# Patient Record
Sex: Female | Born: 1955
Health system: Southern US, Community
[De-identification: ages and names within clinical notes are randomized; demographics above are authoritative.]

## PROBLEM LIST (undated history)

## (undated) DIAGNOSIS — Z8719 Personal history of other diseases of the digestive system: Secondary | ICD-10-CM

## (undated) DIAGNOSIS — R160 Hepatomegaly, not elsewhere classified: Secondary | ICD-10-CM

## (undated) DIAGNOSIS — G4700 Insomnia, unspecified: Secondary | ICD-10-CM

## (undated) DIAGNOSIS — T8859XA Other complications of anesthesia, initial encounter: Secondary | ICD-10-CM

## (undated) DIAGNOSIS — T4145XA Adverse effect of unspecified anesthetic, initial encounter: Secondary | ICD-10-CM

## (undated) DIAGNOSIS — K219 Gastro-esophageal reflux disease without esophagitis: Secondary | ICD-10-CM

## (undated) DIAGNOSIS — F419 Anxiety disorder, unspecified: Secondary | ICD-10-CM

## (undated) DIAGNOSIS — C801 Malignant (primary) neoplasm, unspecified: Secondary | ICD-10-CM

## (undated) HISTORY — DX: Insomnia, unspecified: G47.00

## (undated) HISTORY — PX: OTHER SURGICAL HISTORY: SHX169

## (undated) HISTORY — PX: HERNIA REPAIR: SHX51

## (undated) HISTORY — PX: LIVER SURGERY: SHX698

## (undated) HISTORY — PX: BREAST SURGERY: SHX581

## (undated) HISTORY — DX: Personal history of other diseases of the digestive system: Z87.19

## (undated) HISTORY — DX: Malignant (primary) neoplasm, unspecified: C80.1

## (undated) HISTORY — DX: Hepatomegaly, not elsewhere classified: R16.0

---

## 1999-07-18 ENCOUNTER — Other Ambulatory Visit: Admission: RE | Admit: 1999-07-18 | Discharge: 1999-07-18 | Payer: Self-pay | Admitting: Obstetrics and Gynecology

## 1999-11-06 ENCOUNTER — Other Ambulatory Visit: Admission: RE | Admit: 1999-11-06 | Discharge: 1999-11-06 | Payer: Self-pay | Admitting: Obstetrics and Gynecology

## 1999-12-28 ENCOUNTER — Encounter: Payer: Self-pay | Admitting: Emergency Medicine

## 1999-12-28 ENCOUNTER — Inpatient Hospital Stay (HOSPITAL_COMMUNITY): Admission: EM | Admit: 1999-12-28 | Discharge: 2000-01-11 | Payer: Self-pay | Admitting: Emergency Medicine

## 1999-12-29 ENCOUNTER — Encounter: Payer: Self-pay | Admitting: Surgery

## 2000-09-02 ENCOUNTER — Other Ambulatory Visit: Admission: RE | Admit: 2000-09-02 | Discharge: 2000-09-02 | Payer: Self-pay | Admitting: Obstetrics and Gynecology

## 2001-03-24 ENCOUNTER — Encounter: Admission: RE | Admit: 2001-03-24 | Discharge: 2001-03-24 | Payer: Self-pay | Admitting: Surgery

## 2001-03-24 ENCOUNTER — Encounter: Payer: Self-pay | Admitting: Surgery

## 2002-06-21 ENCOUNTER — Other Ambulatory Visit: Admission: RE | Admit: 2002-06-21 | Discharge: 2002-06-21 | Payer: Self-pay | Admitting: Obstetrics and Gynecology

## 2002-06-22 ENCOUNTER — Other Ambulatory Visit: Admission: RE | Admit: 2002-06-22 | Discharge: 2002-06-22 | Payer: Self-pay | Admitting: Obstetrics and Gynecology

## 2003-09-26 ENCOUNTER — Other Ambulatory Visit: Admission: RE | Admit: 2003-09-26 | Discharge: 2003-09-26 | Payer: Self-pay | Admitting: Obstetrics and Gynecology

## 2011-02-19 ENCOUNTER — Other Ambulatory Visit: Payer: Self-pay | Admitting: Radiology

## 2011-02-20 ENCOUNTER — Other Ambulatory Visit: Payer: Self-pay | Admitting: Radiology

## 2011-02-20 DIAGNOSIS — C50911 Malignant neoplasm of unspecified site of right female breast: Secondary | ICD-10-CM

## 2011-02-26 ENCOUNTER — Ambulatory Visit
Admission: RE | Admit: 2011-02-26 | Discharge: 2011-02-26 | Disposition: A | Discharge status: Home / Self Care | Payer: BC Managed Care – PPO | Source: Ambulatory Visit | Attending: Radiology | Admitting: Radiology

## 2011-02-26 DIAGNOSIS — C50911 Malignant neoplasm of unspecified site of right female breast: Secondary | ICD-10-CM

## 2011-02-26 MED ORDER — GADOBENATE DIMEGLUMINE 529 MG/ML IV SOLN
12.0000 mL | Freq: Once | INTRAVENOUS | Status: AC | PRN
  Administered 2011-02-26: 12 mL via INTRAVENOUS

## 2011-02-27 ENCOUNTER — Other Ambulatory Visit: Payer: Self-pay | Admitting: Oncology

## 2011-02-27 ENCOUNTER — Encounter (HOSPITAL_BASED_OUTPATIENT_CLINIC_OR_DEPARTMENT_OTHER): Payer: BC Managed Care – PPO | Admitting: Oncology

## 2011-02-27 DIAGNOSIS — C50919 Malignant neoplasm of unspecified site of unspecified female breast: Secondary | ICD-10-CM

## 2011-02-27 DIAGNOSIS — D369 Benign neoplasm, unspecified site: Secondary | ICD-10-CM

## 2011-02-27 LAB — CBC WITH DIFFERENTIAL/PLATELET
BASO%: 0.6 % (ref 0.0–2.0)
Eosinophils Absolute: 0 10*3/uL (ref 0.0–0.5)
MCHC: 33.7 g/dL (ref 31.5–36.0)
MONO#: 0.3 10*3/uL (ref 0.1–0.9)
NEUT#: 3.9 10*3/uL (ref 1.5–6.5)
Platelets: 229 10*3/uL (ref 145–400)
RBC: 4.59 10*6/uL (ref 3.70–5.45)
RDW: 13.1 % (ref 11.2–14.5)
WBC: 5.8 10*3/uL (ref 3.9–10.3)
lymph#: 1.5 10*3/uL (ref 0.9–3.3)

## 2011-02-27 LAB — COMPREHENSIVE METABOLIC PANEL
ALT: 48 U/L — ABNORMAL HIGH (ref 0–35)
Albumin: 4.3 g/dL (ref 3.5–5.2)
CO2: 28 mEq/L (ref 19–32)
Calcium: 9.5 mg/dL (ref 8.4–10.5)
Chloride: 100 mEq/L (ref 96–112)
Glucose, Bld: 117 mg/dL — ABNORMAL HIGH (ref 70–99)
Potassium: 3.7 mEq/L (ref 3.5–5.3)
Sodium: 136 mEq/L (ref 135–145)
Total Bilirubin: 0.3 mg/dL (ref 0.3–1.2)
Total Protein: 7.4 g/dL (ref 6.0–8.3)

## 2011-03-01 ENCOUNTER — Other Ambulatory Visit (INDEPENDENT_AMBULATORY_CARE_PROVIDER_SITE_OTHER): Payer: Self-pay | Admitting: Surgery

## 2011-03-01 DIAGNOSIS — C50911 Malignant neoplasm of unspecified site of right female breast: Secondary | ICD-10-CM

## 2011-03-04 ENCOUNTER — Encounter: Payer: BC Managed Care – PPO | Admitting: Genetic Counselor

## 2011-03-04 ENCOUNTER — Ambulatory Visit (HOSPITAL_COMMUNITY)
Admission: RE | Admit: 2011-03-04 | Discharge: 2011-03-04 | Disposition: A | Discharge status: Home / Self Care | Payer: BC Managed Care – PPO | Source: Ambulatory Visit | Attending: Oncology | Admitting: Oncology

## 2011-03-04 DIAGNOSIS — C50919 Malignant neoplasm of unspecified site of unspecified female breast: Secondary | ICD-10-CM | POA: Insufficient documentation

## 2011-03-04 DIAGNOSIS — D369 Benign neoplasm, unspecified site: Secondary | ICD-10-CM

## 2011-03-04 DIAGNOSIS — K7689 Other specified diseases of liver: Secondary | ICD-10-CM | POA: Insufficient documentation

## 2011-03-04 MED ORDER — GADOBENATE DIMEGLUMINE 529 MG/ML IV SOLN
15.0000 mL | Freq: Once | INTRAVENOUS | Status: AC | PRN
  Administered 2011-03-04: 15 mL via INTRAVENOUS

## 2011-03-21 ENCOUNTER — Other Ambulatory Visit (INDEPENDENT_AMBULATORY_CARE_PROVIDER_SITE_OTHER): Payer: Self-pay | Admitting: General Surgery

## 2011-03-21 ENCOUNTER — Encounter (HOSPITAL_COMMUNITY)
Admission: RE | Admit: 2011-03-21 | Discharge: 2011-03-21 | Disposition: A | Discharge status: Home / Self Care | Payer: BC Managed Care – PPO | Source: Ambulatory Visit | Attending: Surgery | Admitting: Surgery

## 2011-03-21 DIAGNOSIS — C50911 Malignant neoplasm of unspecified site of right female breast: Secondary | ICD-10-CM

## 2011-03-21 LAB — COMPREHENSIVE METABOLIC PANEL
Alkaline Phosphatase: 138 U/L — ABNORMAL HIGH (ref 39–117)
BUN: 18 mg/dL (ref 6–23)
Calcium: 10.4 mg/dL (ref 8.4–10.5)
GFR calc non Af Amer: >60 mL/min (ref >60)
Glucose, Bld: 76 mg/dL (ref 70–99)
Total Protein: 7.3 g/dL (ref 6.0–8.3)

## 2011-03-21 LAB — CBC
MCV: 87.6 fL (ref 78.0–100.0)
Platelets: 214 10*3/uL (ref 150–400)
RBC: 4.68 MIL/uL (ref 3.87–5.11)
RDW: 13 % (ref 11.5–15.5)
WBC: 7.1 10*3/uL (ref 4.0–10.5)

## 2011-03-21 LAB — SURGICAL PCR SCREEN
MRSA, PCR: NEGATIVE
Staphylococcus aureus: POSITIVE — AB

## 2011-03-22 ENCOUNTER — Other Ambulatory Visit (INDEPENDENT_AMBULATORY_CARE_PROVIDER_SITE_OTHER): Payer: Self-pay | Admitting: General Surgery

## 2011-03-22 ENCOUNTER — Ambulatory Visit (HOSPITAL_COMMUNITY)
Admission: RE | Admit: 2011-03-22 | Discharge: 2011-03-22 | Disposition: A | Discharge status: Home / Self Care | Payer: BC Managed Care – PPO | Source: Ambulatory Visit | Attending: Surgery | Admitting: Surgery

## 2011-03-22 ENCOUNTER — Ambulatory Visit (HOSPITAL_COMMUNITY): Admission: RE | Admit: 2011-03-22 | Payer: BC Managed Care – PPO | Source: Ambulatory Visit | Admitting: Surgery

## 2011-03-22 ENCOUNTER — Ambulatory Visit (HOSPITAL_BASED_OUTPATIENT_CLINIC_OR_DEPARTMENT_OTHER)
Admission: RE | Admit: 2011-03-22 | Discharge: 2011-03-22 | Disposition: A | Discharge status: Home / Self Care | Payer: BC Managed Care – PPO | Source: Ambulatory Visit | Attending: General Surgery | Admitting: General Surgery

## 2011-03-22 DIAGNOSIS — C50911 Malignant neoplasm of unspecified site of right female breast: Secondary | ICD-10-CM

## 2011-03-22 DIAGNOSIS — Z01812 Encounter for preprocedural laboratory examination: Secondary | ICD-10-CM | POA: Insufficient documentation

## 2011-03-22 DIAGNOSIS — C50919 Malignant neoplasm of unspecified site of unspecified female breast: Secondary | ICD-10-CM | POA: Insufficient documentation

## 2011-03-22 DIAGNOSIS — Z01810 Encounter for preprocedural cardiovascular examination: Secondary | ICD-10-CM | POA: Insufficient documentation

## 2011-03-22 DIAGNOSIS — D0590 Unspecified type of carcinoma in situ of unspecified breast: Secondary | ICD-10-CM | POA: Insufficient documentation

## 2011-03-22 MED ORDER — TECHNETIUM TC 99M SULFUR COLLOID FILTERED
1.0000 | Freq: Once | INTRAVENOUS | Status: AC | PRN
  Administered 2011-03-22: 1 via INTRADERMAL

## 2011-03-28 NOTE — Op Note (Addendum)
Crystal [name], Crystal [name]              ACCOUNT NO.:  [id number]  MEDICAL RECORD NO.:  [id number]  LOCATION:  [facility]                          FACILITY:  [facility]  PHYSICIAN:  Angelia Mould. Derrell Lolling, M.D.DATE OF BIRTH:  1955/11/29  DATE OF PROCEDURE:  03/22/2011 DATE OF DISCHARGE:                              OPERATIVE REPORT   PREOPERATIVE DIAGNOSIS:  High-grade ductal carcinoma, right breast, clinical stage T2, N0, receptor positive.  POSTOPERATIVE DIAGNOSIS:  High-grade ductal carcinoma, right breast, clinical stage T2, N0, receptor positive.  OPERATIONS PERFORMED: 1. Inject blue dye, right breast. 2. Right partial mastectomy with needle localization. 3. Right axillary sentinel lymph node mapping and biopsy.  SURGEON:  Angelia Mould. Derrell Lolling, MD  OPERATIVE INDICATIONS:  This is a 55 year old Caucasian female who has been followed because of area of distortion in the right breast.  This was recently biopsied with stereotactic technique because of new microcalcifications.  The pathology demonstrated high-grade ductal carcinoma.  The specimen was highly fragmented and so true invasion could not be proven, but was highly suspected.  The patient was seen by Dr. Abigail Miyamoto, Dr. Ruthann Cancer, and Dr. Dorothy Puffer in the Breast [facility] on Feb 27, 2011.  They felt that she was a candidate for breast conservation surgery.  Dr. Magnus Ivan has had to take leave of absence for family medical reasons and the patient wanted to proceed with her surgery, and I have volunteered to do that.  I discussed her care with her in the office yesterday afternoon, and she was brought to the operating room electively.  OPERATIVE FINDINGS:  The patient had a localizing wire placed at [facility] today and the wire was directed from medial to lateral and went nicely through the center of the density.  On the imaging studies and by palpation, this area appeared to be 2.5 cm in diameter.  I  could easily palpate this at about the 12 o'clock position of the right breast.  There was no skin change or fixation.  I found 3 sentinel lymph nodes none of which looked or felt pathologic.  The specimen and mammogram looked good with the density in the center of the specimen.  OPERATIVE TECHNIQUE:  The patient underwent wire localization at [facility] and that wire was placed from medial to lateral and went right through the area of density and marker clip.  The patient was brought to [facility] to the holding area where she underwent injection of radionuclide by the Nuclear Medicine technician. The patient was taken to the operating room.  General anesthesia was induced.  Intravenous antibiotics were given.  Following an alcohol prep, I injected 5 mL of blue dye into the right breast subareolar area. This was 2 mL of methylene blue mixed with 3 mL of saline.  The breast was massaged for 5 minutes.  Surgical time-out was held, identifying correct patient, correct procedure, and correct site.  The right breast and shoulder and axilla and chest wall were then prepped and draped in a sterile fashion.  After reviewing the needle looked films,  I marked a somewhat transverse curvilinear elliptical incision in the superior aspect of the right breast.  Because of the size of the tumor, I decided to take a very thin skin ellipse anteriorly, but the incision itself was at least 10 cm in length because of the size of the palpable mass.  Dissection was carried down around the palpable mass all the way down to the pectoralis fascia and the breast tissue in this area and the tumor contained within was dissected off the pectoralis fascia.  The specimen was removed and marked with a 6-color margin marker kit.  Specimen and mammogram confirmed that the density was in the center of the specimen and it appeared radiographically that we had adequate margins.  This was a large  specimen.  The specimen was then sent to Pathology.  The wound was copiously irrigated with saline.  Hemostasis was excellent.  I undermined the breast tissue superiorly and inferiorly off the pectoralis fascia.  I closed the breast tissue with interrupted sutures of 3-0 Vicryl and the skin was closed with running subcuticular suture of 4-0 Monocryl and Dermabond.  Attention was directed to the right axilla.  Neoprobe was used. Transverse incision was made in the right axilla at the hairline. Dissection was carried down through the subcutaneous tissue.  The clavipectoral fascia was incised.  Using the Neoprobe, I isolated 3 sentinel lymph nodes.  These were all very hot and very blue.  All 3 sentinel lymph nodes were removed and sent for routine histology.  After these three lymph nodes were removed, there was almost no radioactivity left and I felt that this was all that we could do.  The wound was irrigated with saline.  Hemostasis was excellent and achieved electrocautery.  The deeper tissues were closed with interrupted sutures of 3-0 Vicryl and the skin was closed with a running subcuticular suture of 4-0 Monocryl and Dermabond.  A breast binder was placed.  Ice packs were placed.  The patient was taken to the recovery room in stable condition.  Estimated blood loss was about 20 mL.  Complications none. Sponge, needle, and instrument counts were correct.     Angelia Mould. Derrell Lolling, M.D.   ______________________________ Angelia Mould. Derrell Lolling, M.D.    HMI/MEDQ  D:  03/22/2011  T:  03/23/2011  Job:  [id number]  cc:   Deirdre Peer. Nehemiah Settle, M.D. Lendon Colonel, MD Lowella Dell, M.D. Radene Gunning, M.D., Ph.D.  Electronically Signed by Claud Kelp M.D. on 03/28/2011 05:28:36 PM

## 2011-04-11 ENCOUNTER — Encounter: Payer: BC Managed Care – PPO | Admitting: Oncology

## 2011-04-19 ENCOUNTER — Ambulatory Visit
Admission: RE | Admit: 2011-04-19 | Discharge: 2011-04-19 | Disposition: A | Discharge status: Home / Self Care | Payer: BC Managed Care – PPO | Source: Ambulatory Visit | Attending: Radiation Oncology | Admitting: Radiation Oncology

## 2011-04-19 DIAGNOSIS — L539 Erythematous condition, unspecified: Secondary | ICD-10-CM | POA: Insufficient documentation

## 2011-04-19 DIAGNOSIS — C50419 Malignant neoplasm of upper-outer quadrant of unspecified female breast: Secondary | ICD-10-CM | POA: Insufficient documentation

## 2011-04-19 DIAGNOSIS — R5383 Other fatigue: Secondary | ICD-10-CM | POA: Insufficient documentation

## 2011-04-19 DIAGNOSIS — Z510 Encounter for antineoplastic radiation therapy: Secondary | ICD-10-CM | POA: Insufficient documentation

## 2011-04-19 DIAGNOSIS — R5381 Other malaise: Secondary | ICD-10-CM | POA: Insufficient documentation

## 2011-04-22 ENCOUNTER — Encounter (HOSPITAL_BASED_OUTPATIENT_CLINIC_OR_DEPARTMENT_OTHER): Payer: BC Managed Care – PPO | Admitting: Oncology

## 2011-04-22 DIAGNOSIS — C50919 Malignant neoplasm of unspecified site of unspecified female breast: Secondary | ICD-10-CM

## 2011-04-25 ENCOUNTER — Encounter (INDEPENDENT_AMBULATORY_CARE_PROVIDER_SITE_OTHER): Payer: Self-pay | Admitting: General Surgery

## 2011-04-29 ENCOUNTER — Encounter (INDEPENDENT_AMBULATORY_CARE_PROVIDER_SITE_OTHER): Payer: Self-pay | Admitting: General Surgery

## 2011-04-29 ENCOUNTER — Ambulatory Visit (INDEPENDENT_AMBULATORY_CARE_PROVIDER_SITE_OTHER): Payer: BC Managed Care – PPO | Admitting: General Surgery

## 2011-04-29 DIAGNOSIS — C50919 Malignant neoplasm of unspecified site of unspecified female breast: Secondary | ICD-10-CM

## 2011-04-29 DIAGNOSIS — C50911 Malignant neoplasm of unspecified site of right female breast: Secondary | ICD-10-CM

## 2011-04-29 NOTE — Patient Instructions (Signed)
Your breast and axillary incisions appear to have healed without any sign of infection or retained fluid or blood. There is no restriction on your physical activities from here on out.  I agree with proceeding with your radiation therapy.Dr. Darnelle Catalan will provide your long-term followup for breast cancer. I will happy to see you in the future as necessary.

## 2011-04-29 NOTE — Progress Notes (Signed)
Subjective:     Patient ID: Crystal [name], female   DOB: Jul 21, 1956, 55 y.o.   MRN: [id number]  HPI This patient underwent right partial mastectomy and sentinel node biopsy on March 22, 2011. Final pathology report showed a small invasive ductal carcinoma measuring 3 mm. However there was a 2.6 cm mass which was entirely ductal carcinoma in situ, which accounts for the large palpable mass in the breast. Her staging is therefore T1a, N0. Hormone receptors are strongly positive, Ki-67 5%, margins are negative.  She has seen Dr. Dorothy Puffer and they plan to begin radiation therapy on June 23. What she completed radiation therapy Dr. Darnelle Catalan plans  to put her on antiestrogen therapy.  She states that she really has no pain or swelling in her breast. She has full range of motion of her arm. She has no pain or numbness in her arm. Review of Systems     Objective:   Physical Exam On exam, the patient appears to be in no distress. Right breast shows that the lumpectomy incision superiorly has healed nicely and  there is no sign of any fluid collection or hematoma or infection. There is some volume loss. The right nipple it is a little bit higher than the left. The right axillary incision looks good the tissues are soft. The right arm shows normal swelling and numbness or tenderness.    Assessment:     Invasive ductal carcinoma right breast, upper central quadrant, pathologic stage TIa., N0, receptor positive, Ki-67 5%.  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 Margins are negative.  Uneventful recovery following right partial mastectomy and Sentinel node biopsy..    Plan:     She will begin radiation therapy next week under the guidance of Dr. Dorothy Puffer.  Following radiation therapy she will receive antiestrogen therapy under the supervision of Dr. Darnelle Catalan..  We discussed long-term followup, and she stated  that it was her preference to have her gynecologist and Dr. Darnelle Catalan provide long-term followup. Considering this, I will see her back on a p.r.n. Basis.

## 2011-08-09 ENCOUNTER — Ambulatory Visit
Admission: RE | Admit: 2011-08-09 | Discharge: 2011-08-09 | Disposition: A | Discharge status: Home / Self Care | Payer: BC Managed Care – PPO | Source: Ambulatory Visit | Attending: Radiation Oncology | Admitting: Radiation Oncology

## 2011-08-27 ENCOUNTER — Other Ambulatory Visit (HOSPITAL_BASED_OUTPATIENT_CLINIC_OR_DEPARTMENT_OTHER): Payer: BC Managed Care – PPO

## 2011-08-27 ENCOUNTER — Ambulatory Visit (HOSPITAL_BASED_OUTPATIENT_CLINIC_OR_DEPARTMENT_OTHER): Payer: BC Managed Care – PPO | Admitting: Oncology

## 2011-08-27 ENCOUNTER — Telehealth: Payer: Self-pay | Admitting: *Deleted

## 2011-08-27 ENCOUNTER — Other Ambulatory Visit: Payer: Self-pay | Admitting: Oncology

## 2011-08-27 VITALS — BP 135/72 | HR 72 | Temp 98.6°F | Ht 65.5 in | Wt 138.8 lb

## 2011-08-27 DIAGNOSIS — C50919 Malignant neoplasm of unspecified site of unspecified female breast: Secondary | ICD-10-CM

## 2011-08-27 DIAGNOSIS — Z170 Estrogen receptor positive status [ER+]: Secondary | ICD-10-CM

## 2011-08-27 DIAGNOSIS — C50419 Malignant neoplasm of upper-outer quadrant of unspecified female breast: Secondary | ICD-10-CM

## 2011-08-27 LAB — CBC WITH DIFFERENTIAL/PLATELET
Basophils Absolute: 0 10*3/uL (ref 0.0–0.1)
EOS%: 0 % (ref 0.0–7.0)
HGB: 12.4 g/dL (ref 11.6–15.9)
MCH: 30.1 pg (ref 25.1–34.0)
NEUT#: 4.1 10*3/uL (ref 1.5–6.5)
RBC: 4.11 10*6/uL (ref 3.70–5.45)
RDW: 13.4 % (ref 11.2–14.5)
lymph#: 1.3 10*3/uL (ref 0.9–3.3)

## 2011-08-27 NOTE — Telephone Encounter (Signed)
GAVE PATIENT APPOINTMENT FOR 02-2012.  

## 2011-08-27 NOTE — Progress Notes (Signed)
ID: Crystal [name]   Interval History: Center returns today for followup of her tolerance of letrozole in the setting of breast cancer. She does complain of slight increased achiness particularly involving her hands. She takes ibuprofen 400 mg with food at night in bed mostly takes care of the problem. She has some diarrhea initially but that resolved and she has some worsening problems with insomnia initially but that resolved as well.  ROS:  She denies any unusual headaches visual changes cough phlegm production pleurisy shortness of breath change in bowel or bladder habits, dizziness weakness unusual fatigue or unusual weight loss rash bleeding fever or pain a detailed review of systems was noncontributory  Medications: I have reviewed the patient's current medications.   Current Outpatient Prescriptions  Medication Sig Dispense Refill  . b complex vitamins tablet Take 1 tablet by mouth daily.        . calcium carbonate (TUMS - DOSED IN MG ELEMENTAL CALCIUM) 500 MG chewable tablet Chew 1 tablet by mouth daily.        . calcium-vitamin D (OSCAL WITH D) 500-200 MG-UNIT per tablet Take 1 tablet by mouth daily.        . fish oil-omega-3 fatty acids 1000 MG capsule Take 2 g by mouth daily.        Marland Kitchen glucosamine-chondroitin 500-400 MG tablet Take 1 tablet by mouth 3 (three) times daily.        Marland Kitchen ibuprofen (ADVIL,MOTRIN) 100 MG chewable tablet Chew 100 mg by mouth every 8 (eight) hours as needed.        . Multiple Vitamins-Minerals (MULTIVITAMIN WITH MINERALS) tablet Take 1 tablet by mouth daily.        Marland Kitchen Specialty Vitamins Products (MAGNESIUM, AMINO ACID CHELATE,) 133 MG tablet Take 1 tablet by mouth 2 (two) times daily.           Objective: Vital signs in last 24 hours: BP 135/72  Pulse 72  Temp 98.6 F (37 [name])  Ht 5' 5.5" (1.664 m)  Wt 138 lb 12.8 oz (62.959 kg)  BMI 22.75 kg/m2   Physical Exam:    Sclerae unicteric  Oropharynx clear  No peripheral adenopathy  Lungs clear -- no  rales or rhonchi  Heart regular rate and rhythm  Abdomen benign  MSK no focal spinal tenderness, no peripheral edema  Neuro nonfocal  Breast exam: Right breast status post lumpectomy no evidence of recurrence left breast no suspicious finding  Lab Results:   BMET    Component Value Date/Time   NA 140 03/21/2011 0838   K 4.1 03/21/2011 0838   CL 101 03/21/2011 0838   CO2 28 03/21/2011 0838   GLUCOSE 76 03/21/2011 0838   BUN 18 03/21/2011 0838   CREATININE 0.71 03/21/2011 0838   CALCIUM 10.4 03/21/2011 0838   GFRNONAA >60 03/21/2011 0838   GFRAA  Value: >60        The eGFR has been calculated using the MDRD equation. This calculation has not been validated in all clinical situations. eGFR's persistently <60 mL/min signify possible Chronic Kidney Disease. 03/21/2011 0838     CMP     Component Value Date/Time   NA 140 03/21/2011 0838   K 4.1 03/21/2011 0838   CL 101 03/21/2011 0838   CO2 28 03/21/2011 0838   GLUCOSE 76 03/21/2011 0838   BUN 18 03/21/2011 0838   CREATININE 0.71 03/21/2011 0838   CALCIUM 10.4 03/21/2011 0838   PROT 7.3 03/21/2011 0838   ALBUMIN 4.1  03/21/2011 0838   AST 35 03/21/2011 0838   ALT 53* 03/21/2011 0838   ALKPHOS 138* 03/21/2011 0838   BILITOT 0.3 03/21/2011 0838   GFRNONAA >60 03/21/2011 0838   GFRAA  Value: >60        The eGFR has been calculated using the MDRD equation. This calculation has not been validated in all clinical situations. eGFR's persistently <60 mL/min signify possible Chronic Kidney Disease. 03/21/2011 0838    CBC    Component Value Date/Time   WBC 7.1 03/21/2011 0838   RBC 4.68 03/21/2011 0838   HGB 12.4 08/27/2011 1330   HGB 13.5 03/21/2011 0838   HCT 36.1 08/27/2011 1330   HCT 41.0 03/21/2011 0838   PLT 185 08/27/2011 1330   PLT 214 03/21/2011 0838   MCV 87.8 08/27/2011 1330   MCV 87.6 03/21/2011 0838   MCH 28.8 03/21/2011 0838   MCHC 32.9 03/21/2011 0838   RDW 13.0 03/21/2011 0838   EOSABS 0.0 08/27/2011 1330   BASOSABS 0.0 08/27/2011 1330        Studies/Results: She had  a bone scan result that Crystal [name], but I have not yet received the results.  Assessment:  55 year old Crystal [name] woman status post right lumpectomy and sentinel lymph node sampling in June of 2012 for a T1AN0 invasive ductal carcinoma, grade 3, estrogen and progesterone receptor positive, HER-2/neu negative; on letrozole since July of 2012, with fair tolerance. She is known to be BRCA1 and 2 and he 53 negative   Plan: The plan is to continue letrozole for 5 years if she sees Dr. Derrell [name] again in May or June and then she can see me again next November-after all she has an excellent prognosis overall. She knows to call for any problems that may develop before then at particularly if she thinks she is having any other symptoms related to the letrozole. She is going to call 2 days from now for review of the bone density results she was also interested in her calcium since she tells me Dr. Nehemiah [name] has been following this. At this point I do not anticipate starting a bisphosphonate  Crystal [name] 08/27/2011

## 2012-02-14 ENCOUNTER — Other Ambulatory Visit: Payer: Self-pay | Admitting: *Deleted

## 2012-02-14 DIAGNOSIS — C50919 Malignant neoplasm of unspecified site of unspecified female breast: Secondary | ICD-10-CM

## 2012-02-14 DIAGNOSIS — E639 Nutritional deficiency, unspecified: Secondary | ICD-10-CM

## 2012-02-17 ENCOUNTER — Telehealth: Payer: Self-pay | Admitting: *Deleted

## 2012-02-17 ENCOUNTER — Other Ambulatory Visit (HOSPITAL_BASED_OUTPATIENT_CLINIC_OR_DEPARTMENT_OTHER): Payer: BC Managed Care – PPO | Admitting: Lab

## 2012-02-17 DIAGNOSIS — C50919 Malignant neoplasm of unspecified site of unspecified female breast: Secondary | ICD-10-CM

## 2012-02-17 DIAGNOSIS — E639 Nutritional deficiency, unspecified: Secondary | ICD-10-CM

## 2012-02-17 LAB — CBC WITH DIFFERENTIAL/PLATELET
Basophils Absolute: 0 10*3/uL (ref 0.0–0.1)
Eosinophils Absolute: 0.1 10*3/uL (ref 0.0–0.5)
HGB: 14.2 g/dL (ref 11.6–15.9)
MCV: 87.7 fL (ref 79.5–101.0)
MONO#: 0.4 10*3/uL (ref 0.1–0.9)
NEUT#: 3.2 10*3/uL (ref 1.5–6.5)
RBC: 4.86 10*6/uL (ref 3.70–5.45)
RDW: 13.5 % (ref 11.2–14.5)
WBC: 4.7 10*3/uL (ref 3.9–10.3)
lymph#: 1.1 10*3/uL (ref 0.9–3.3)

## 2012-02-17 NOTE — Telephone Encounter (Signed)
patient requested in person to move her appointment to 03-16-2012 pm patient confirmed in person the new date and tim 

## 2012-02-17 NOTE — Telephone Encounter (Signed)
patient requested in person to move her appointment to 03-16-2012 pm patient confirmed in person the new date and tim

## 2012-02-18 LAB — COMPREHENSIVE METABOLIC PANEL
Albumin: 4.7 g/dL (ref 3.5–5.2)
BUN: 11 mg/dL (ref 6–23)
CO2: 27 mEq/L (ref 19–32)
Calcium: 9.6 mg/dL (ref 8.4–10.5)
Chloride: 103 mEq/L (ref 96–112)
Glucose, Bld: 101 mg/dL — ABNORMAL HIGH (ref 70–99)
Potassium: 4.4 mEq/L (ref 3.5–5.3)
Sodium: 141 mEq/L (ref 135–145)
Total Protein: 7.4 g/dL (ref 6.0–8.3)

## 2012-02-18 LAB — CANCER ANTIGEN 27.29: CA 27.29: 30 U/mL (ref 0–39)

## 2012-02-24 ENCOUNTER — Ambulatory Visit: Payer: BC Managed Care – PPO | Admitting: Oncology

## 2012-03-02 ENCOUNTER — Ambulatory Visit: Payer: BC Managed Care – PPO | Admitting: Oncology

## 2012-03-16 ENCOUNTER — Other Ambulatory Visit: Payer: Self-pay | Admitting: Oncology

## 2012-03-16 ENCOUNTER — Ambulatory Visit (HOSPITAL_BASED_OUTPATIENT_CLINIC_OR_DEPARTMENT_OTHER): Payer: BC Managed Care – PPO | Admitting: Oncology

## 2012-03-16 VITALS — BP 122/70 | HR 76 | Temp 98.3°F | Ht 65.5 in | Wt 135.7 lb

## 2012-03-16 DIAGNOSIS — C50219 Malignant neoplasm of upper-inner quadrant of unspecified female breast: Secondary | ICD-10-CM

## 2012-03-16 DIAGNOSIS — C50811 Malignant neoplasm of overlapping sites of right female breast: Secondary | ICD-10-CM | POA: Insufficient documentation

## 2012-03-16 DIAGNOSIS — C50919 Malignant neoplasm of unspecified site of unspecified female breast: Secondary | ICD-10-CM

## 2012-03-16 DIAGNOSIS — Z170 Estrogen receptor positive status [ER+]: Secondary | ICD-10-CM

## 2012-03-16 DIAGNOSIS — G4700 Insomnia, unspecified: Secondary | ICD-10-CM

## 2012-03-16 NOTE — Progress Notes (Signed)
ID: Crystal [name]   DOB: [birth year]  MR#: [id number]  WUJ#:[id number]  HISTORY OF PRESENT ILLNESS: The patient had bilateral diagnostic mammography 02/12/2011.  She has a history of cystic changes noted on prior studies in April 2011 and April 2010.  On the Feb 12, 2011 study there were new calcifications identified superiorly in the right breast, without any obvious mass.  The patient was brought back for further studies, 02/19/2011 and Dr. Tilda Burrow was able to localize the calcifications under stereotactic guidance.  The biopsy ([id number]) showed high-grade ductal carcinoma with papillary features which was 70% estrogen receptor positive, 0% progesterone receptor positive.  There was no definitive invasion and therefore further studies were not performed.   Bilateral breast MRIs were obtained Feb 26, 2011.  This showed marked nodular enhancement throughout both breasts, and in the upper central portion there was a post biopsy area of change measuring up to 3.7 cm.  Most of this is a fluid cavity with a thin rim of enhancement.  There were no suspicious areas in the left breast.  No internal mammary or axillary lymph nodes.  Of course, the patient has a history of hepatic adenomas and these were noted incidentally on the MRI. Her definitive surgical treatment and subsequent history areas detailed below.  INTERVAL HISTORY: Crystal [name] returns today for followup of her breast cancer. Overall she is doing "pretty good". The big news is that she is expecting a female grandchild sometime in the late summer.  REVIEW OF SYSTEMS: She is tolerating the letrozole with no of arthralgias or myalgias. She does have significant hot flashes, but she tells me she has had these since her late 21s and they are no worse and they were previously. She has significant insomnia. She has not tried Benadryl for this and we discussed that today. It's more of a falling asleep problem than a waking up later problem. She does feel tired,  and she said a couple of viral illnesses possibly because she's been helping with HER-57-year-old granddaughter. She is walking at least 4 times a week at least 45 minutes at a time. Otherwise a detailed review of systems today was stable  PAST MEDICAL HISTORY: Past Medical History  Diagnosis Date  . History of small bowel obstruction   . Liver masses   . Insomnia   . Cancer     breast  history of small-bowel obstruction with partial colectomy March 2001.  The patient has a history of hepatic adenomas.  She has had liver resections x2.  The data for this is at [facility] and we will try to obtain it.  She has also had incisional hernia repair.  She has a history of osteopenia, and she has a history of multiple anti-red cell antibodies making a transfusion difficult.  The patient tells me that she has a history of low calcium and a tendency to get liver injections.  PAST SURGICAL HISTORY: Past Surgical History  Procedure Date  . Liver surgery   . Breast surgery   . Laparotomies   . Hernia repair     incisional hernia repair x3    FAMILY HISTORY Family History  Problem Relation Age of Onset  . Ovarian cancer Mother   . Breast cancer Maternal Grandmother   . Stomach cancer Maternal Grandfather   The patient's mother died from ovarian cancer at the age of 31.  The patient's father is alive at age 68.  She has 2 brothers, no sisters.  A maternal grandmother was diagnosed with  breast cancer at the age of 15, and the maternal grandfather had stomach cancer at the age of 39.  GYNECOLOGIC HISTORY: The patient had menarche at age 61.  Her last menstrual period was in 2005.  She never used hormone replacement.  She used birth control briefly to control bleeding prior to 1980.  She was 56 years old at the birth of her first child.  She started having mammograms at age 52 because of a strong family history.  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   Her husband, Crystal [name], present today, is a retired Warehouse manager from a [employer] production pl[employer].  Son, Crystal [name], lives in [place] and works for that [employer].  Daughter, Crystal [name], lives in [place] and is a homemaker.   ADVANCED DIRECTIVES: in place  HEALTH MAINTENANCE: History  Substance Use Topics  . Smoking status: Never Smoker   . Smokeless tobacco: Not on file  . Alcohol Use: 1.2 oz/week    2 Glasses of wine per week     Colonoscopy:  PAP:  Bone density:  Lipid panel:  Allergies  Allergen Reactions  . Promethazine Hcl Other (See Comments)    Causes shaking  . Penicillins Rash    Current Outpatient Prescriptions  Medication Sig Dispense Refill  . b complex vitamins tablet Take 1 tablet by mouth daily.        . calcium carbonate (TUMS - DOSED IN MG ELEMENTAL CALCIUM) 500 MG chewable tablet Chew 1 tablet by mouth daily.        . calcium-vitamin D (OSCAL WITH D) 500-200 MG-UNIT per tablet Take 1 tablet by mouth daily.        . fish oil-omega-3 fatty acids 1000 MG capsule Take 2 g by mouth daily.        Marland Kitchen glucosamine-chondroitin 500-400 MG tablet Take 1 tablet by mouth 3 (three) times daily.        Marland Kitchen ibuprofen (ADVIL,MOTRIN) 100 MG chewable tablet Chew 100 mg by mouth every 8 (eight) hours as needed.        . Multiple Vitamins-Minerals (MULTIVITAMIN WITH MINERALS) tablet Take 1 tablet by mouth daily.        Marland Kitchen Specialty Vitamins Products (MAGNESIUM, AMINO ACID CHELATE,) 133 MG tablet Take 1 tablet by mouth 2 (two) times daily.        Marland Kitchen letrozole (FEMARA) 2.5 MG tablet         OBJECTIVE: Middle-aged white woman in no acute distress Filed Vitals:   03/16/12 1532  BP: 122/70  Pulse: 76  Temp: 98.3 F (36.8 C)     Body mass index is 22.24 kg/(m^2).    ECOG FS: 0  Sclerae unicteric Oropharynx clear No peripheral adenopathy Lungs no rales or rhonchi Heart regular rate and rhythm Abd benign MSK no focal spinal tenderness, no peripheral edema Neuro:  nonfocal Breasts: Right breast is status post lumpectomy; there is no evidence of local recurrence. Left breast is unremarkable  LAB RESULTS: Lab Results  Component Value Date   WBC 4.7 02/17/2012   NEUTROABS 3.2 02/17/2012   HGB 14.2 02/17/2012   HCT 42.6 02/17/2012   MCV 87.7 02/17/2012   PLT 202 02/17/2012      Chemistry      Component Value Date/Time   NA 141 02/17/2012 1015   K 4.4 02/17/2012 1015   CL 103 02/17/2012 1015   CO2 27 02/17/2012 1015   BUN 11 02/17/2012 1015   CREATININE 0.82  02/17/2012 1015      Component Value Date/Time   CALCIUM 9.6 02/17/2012 1015   ALKPHOS 146* 02/17/2012 1015   AST 31 02/17/2012 1015   ALT 40* 02/17/2012 1015   BILITOT 0.4 02/17/2012 1015       Lab Results  Component Value Date   LABCA2 30 02/17/2012    No components found with this basename: [id number]    No results found for this basename: INR:1;PROTIME:1 in the last 168 hours  Urinalysis No results found for this basename: colorurine, appearanceur, labspec, phurine, glucoseu, hgbur, bilirubinur, ketonesur, proteinur, urobilinogen, nitrite, leukocytesur    STUDIES: Bone density obtained 08/20/2011 showed osteoporosis  ASSESSMENT: 56 year old BRCA 1-2 and p-53 negative [place] woman status post right lumpectomy and sentinel lymph node sampling June 2012 for a T1a N0 invasive ductal carcinoma, grade 3, estrogen and progesterone receptor positive, HER2/neu negative. She completed radiation September 2012. She has been on letrozole since July 2012 with good tolerance.   PLAN: She is doing terrific from a breast cancer point of view. We're going to continue the letrozole for 5 years. We will repeat a bone density November of 2014. Until then she'll continue her vitamin D and calcium supplementation and her walking program. She is going to try Benadryl at night for insomnia issues. She will see Dr. Renae Fickle like in September or October and see [place] again in January. She will see Dr. Algie Coffer in April of next year.  Arline Asp is due for mammography, and she needs a yearly MRI as well because of her strong family history and history of dense breasts. We will try to arrange that for her at [facility] this month.   Gee Habig C    03/16/2012

## 2012-03-17 ENCOUNTER — Telehealth: Payer: Self-pay | Admitting: *Deleted

## 2012-03-17 NOTE — Telephone Encounter (Signed)
made patient appointment for gyn at [phone] arrival time is 9:00am at 03-27-2012 patient has been informed of the outside appointment

## 2012-04-07 ENCOUNTER — Ambulatory Visit
Admission: RE | Admit: 2012-04-07 | Discharge: 2012-04-07 | Disposition: A | Discharge status: Home / Self Care | Payer: BC Managed Care – PPO | Source: Ambulatory Visit | Attending: Oncology | Admitting: Oncology

## 2012-04-07 DIAGNOSIS — C50919 Malignant neoplasm of unspecified site of unspecified female breast: Secondary | ICD-10-CM

## 2012-04-07 MED ORDER — GADOBENATE DIMEGLUMINE 529 MG/ML IV SOLN
12.0000 mL | Freq: Once | INTRAVENOUS | Status: AC | PRN
  Administered 2012-04-07: 12 mL via INTRAVENOUS

## 2012-04-13 ENCOUNTER — Other Ambulatory Visit: Payer: Self-pay | Admitting: Oncology

## 2012-05-11 ENCOUNTER — Other Ambulatory Visit: Payer: Self-pay | Admitting: *Deleted

## 2012-05-11 DIAGNOSIS — C50919 Malignant neoplasm of unspecified site of unspecified female breast: Secondary | ICD-10-CM

## 2012-05-11 MED ORDER — LETROZOLE 2.5 MG PO TABS: 2.5000 mg | ORAL_TABLET | Freq: Every day | ORAL | Status: DC

## 2012-07-08 ENCOUNTER — Other Ambulatory Visit: Payer: Self-pay | Admitting: Internal Medicine

## 2012-07-08 DIAGNOSIS — Z0000 Encounter for general adult medical examination without abnormal findings: Secondary | ICD-10-CM

## 2012-08-19 ENCOUNTER — Other Ambulatory Visit: Payer: BC Managed Care – PPO

## 2012-10-20 ENCOUNTER — Ambulatory Visit: Payer: BC Managed Care – PPO | Admitting: Oncology

## 2012-10-20 ENCOUNTER — Other Ambulatory Visit (HOSPITAL_BASED_OUTPATIENT_CLINIC_OR_DEPARTMENT_OTHER): Payer: BC Managed Care – PPO | Admitting: Lab

## 2012-10-20 DIAGNOSIS — C50919 Malignant neoplasm of unspecified site of unspecified female breast: Secondary | ICD-10-CM

## 2012-10-20 LAB — CBC WITH DIFFERENTIAL/PLATELET
BASO%: 0.6 % (ref 0.0–2.0)
EOS%: 1.2 % (ref 0.0–7.0)
LYMPH%: 24.7 % (ref 14.0–49.7)
MCH: 29.8 pg (ref 25.1–34.0)
MCHC: 33.5 g/dL (ref 31.5–36.0)
MONO#: 0.3 10*3/uL (ref 0.1–0.9)
NEUT%: 67.7 % (ref 38.4–76.8)
RBC: 4.56 10*6/uL (ref 3.70–5.45)
WBC: 5.4 10*3/uL (ref 3.9–10.3)
lymph#: 1.3 10*3/uL (ref 0.9–3.3)

## 2012-10-20 LAB — COMPREHENSIVE METABOLIC PANEL (CC13)
AST: 28 U/L (ref 5–34)
Albumin: 3.9 g/dL (ref 3.5–5.0)
Alkaline Phosphatase: 153 U/L — ABNORMAL HIGH (ref 40–150)
Chloride: 105 mEq/L (ref 98–107)
Glucose: 93 mg/dl (ref 70–99)
Potassium: 4.4 mEq/L (ref 3.5–5.1)
Sodium: 140 mEq/L (ref 136–145)
Total Protein: 7.4 g/dL (ref 6.4–8.3)

## 2012-10-20 LAB — CANCER ANTIGEN 27.29: CA 27.29: 28 U/mL (ref 0–39)

## 2012-10-27 ENCOUNTER — Encounter: Payer: Self-pay | Admitting: Oncology

## 2012-10-27 ENCOUNTER — Ambulatory Visit (HOSPITAL_BASED_OUTPATIENT_CLINIC_OR_DEPARTMENT_OTHER): Payer: BC Managed Care – PPO | Admitting: Oncology

## 2012-10-27 ENCOUNTER — Telehealth: Payer: Self-pay | Admitting: Oncology

## 2012-10-27 ENCOUNTER — Encounter: Payer: Self-pay | Admitting: *Deleted

## 2012-10-27 VITALS — BP 134/73 | HR 77 | Temp 98.0°F | Resp 20 | Ht 65.5 in | Wt 137.4 lb

## 2012-10-27 DIAGNOSIS — C50119 Malignant neoplasm of central portion of unspecified female breast: Secondary | ICD-10-CM

## 2012-10-27 DIAGNOSIS — Z170 Estrogen receptor positive status [ER+]: Secondary | ICD-10-CM

## 2012-10-27 DIAGNOSIS — M810 Age-related osteoporosis without current pathological fracture: Secondary | ICD-10-CM

## 2012-10-27 DIAGNOSIS — C50919 Malignant neoplasm of unspecified site of unspecified female breast: Secondary | ICD-10-CM

## 2012-10-27 MED ORDER — LETROZOLE 2.5 MG PO TABS: 2.5000 mg | ORAL_TABLET | Freq: Every day | ORAL | Status: DC

## 2012-10-27 NOTE — Progress Notes (Signed)
ID: Crystal [name]   DOB: May 08, 1956  MR#: [id number]  WUJ#:[id number]  HISTORY OF PRESENT ILLNESS: The patient had bilateral diagnostic mammography 02/12/2011.  She has a history of cystic changes noted on prior studies in April 2011 and April 2010.  On the Feb 12, 2011 study there were new calcifications identified superiorly in the right breast, without any obvious mass.  The patient was brought back for further studies, 02/19/2011 and Dr. Tilda Burrow was able to localize the calcifications under stereotactic guidance.  The biopsy ([id number]) showed high-grade ductal carcinoma with papillary features which was 70% estrogen receptor positive, 0% progesterone receptor positive.  There was no definitive invasion and therefore further studies were not performed.   Bilateral breast MRIs were obtained Feb 26, 2011.  This showed marked nodular enhancement throughout both breasts, and in the upper central portion there was a post biopsy area of change measuring up to 3.7 cm.  Most of this is a fluid cavity with a thin rim of enhancement.  There were no suspicious areas in the left breast.  No internal mammary or axillary lymph nodes.  Of course, the patient has a history of hepatic adenomas and these were noted incidentally on the MRI. Her definitive surgical treatment and subsequent history areas detailed below.  INTERVAL HISTORY: Crystal [name] returns today for followup of her breast cancer. Overall she is doing well.  She reports no complaints.  She reports that she is excited to take her family and new grandchild to the beach for the first time.  REVIEW OF SYSTEMS: She is tolerating the letrozole with minimal arthralgias or myalgias. She reports that she has increased her physical activity including starting a new Yoga class.  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  She reports that the use of ibuprofen as needed  and glucosamine-chondroitin on a daily basis as effective pain relief measures.  She continues to report significant hot flashes, but she tells me she has had these since her late 30s and they have improved since her last visit.  She reports improvement in insomnia and she feels that increasing her physical activity has helped.  She continues walking at least 4 times a week at least 45 minutes at a time along with YOGA. Otherwise a detailed review of systems today was stable  PAST MEDICAL HISTORY: Past Medical History  Diagnosis Date  . History of small bowel obstruction   . Liver masses   . Insomnia   . Cancer     breast  History of small-bowel obstruction with partial colectomy March 2001.  The patient has a history of hepatic adenomas.  She has had liver resections x2.  She has also had incisional hernia repair.  She has a history of osteopenia, and she has a history of multiple anti-red cell antibodies making a transfusion difficult.    PAST SURGICAL HISTORY: Past Surgical History  Procedure Date  . Liver surgery   . Breast surgery   . Laparotomies   . Hernia repair     incisional hernia repair x3    FAMILY HISTORY Family History  Problem Relation Age of Onset  . Ovarian cancer Mother   . Breast cancer Maternal Grandmother   . Stomach cancer Maternal Grandfather   The patient's mother died from ovarian cancer at the age of 65.  She has 2 brothers, no sisters.  A maternal grandmother was diagnosed with breast cancer at  the age of 77, and the maternal grandfather had stomach cancer at the age of 55.  GYNECOLOGIC HISTORY: The patient had menarche at age 60.  Her last menstrual period was in 2005.  She never used hormone replacement.  She used birth control briefly to control bleeding prior to 1980.  She was 57 years old at the birth of her first child.  She started having mammograms at age 5 because of a strong family history.  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  Her husband, Crystal [name], present today, is a retired Warehouse manager from a [employer] production pl[employer].  Son, Crystal [name], lives in [place] and works for that [employer].  Daughter, Crystal [name], lives in [place] and is a homemaker.   ADVANCED DIRECTIVES: in place  HEALTH MAINTENANCE: History  Substance Use Topics  . Smoking status: Never Smoker   . Smokeless tobacco: Not on file  . Alcohol Use: 1.2 oz/week    2 Glasses of wine per week     Colonoscopy:  Scheduled on Feb. 19, 2014 with Dr. Danise Edge  PAP: Last April with Dr. Ernestina Penna  Bone density: Scheduled for November of 2014  Lipid panel:  Managed by Dr. Nehemiah Settle  Allergies  Allergen Reactions  . Promethazine Hcl Other (See Comments)    Causes shaking  . Penicillins Rash    Current Outpatient Prescriptions  Medication Sig Dispense Refill  . b complex vitamins tablet Take 1 tablet by mouth daily.        . calcium carbonate (TUMS - DOSED IN MG ELEMENTAL CALCIUM) 500 MG chewable tablet Chew 1 tablet by mouth daily.        . calcium-vitamin D (OSCAL WITH D) 500-200 MG-UNIT per tablet Take 1 tablet by mouth daily.        . fish oil-omega-3 fatty acids 1000 MG capsule Take 2 g by mouth daily.        Marland Kitchen glucosamine-chondroitin 500-400 MG tablet Take 1 tablet by mouth 3 (three) times daily.        Marland Kitchen ibuprofen (ADVIL,MOTRIN) 100 MG chewable tablet Chew 100 mg by mouth every 8 (eight) hours as needed.        Marland Kitchen letrozole (FEMARA) 2.5 MG tablet Take 1 tablet (2.5 mg total) by mouth daily.  90 tablet  1  . Multiple Vitamins-Minerals (MULTIVITAMIN WITH MINERALS) tablet Take 1 tablet by mouth daily.        Marland Kitchen Specialty Vitamins Products (MAGNESIUM, AMINO ACID CHELATE,) 133 MG tablet Take 1 tablet by mouth 2 (two) times daily.          OBJECTIVE:  Filed Vitals:   10/27/12 0901  BP: 134/73  Pulse: 77  Temp: 98 F (36.7 C)  Resp: 20     Body mass index is 22.52 kg/(m^2).    ECOG FS: 0  General:  Well developed, well nourished female in no acute distress. Alert and oriented x 3.  Head/Neck: Sclerae anicteric.  Oropharynx clear without lesions.  Supple without any enlargements.  Lymph node survey: No cervical, supraclavicular, or inguinal adenopathy  Cardiovascular: Regular rate and rhythm. S1 and S2 normal.  Lungs: Clear to auscultation bilaterally. No wheezes/crackles/rhonchi noted.  Skin: No rashes or lesions present. Breasts: Right breast is status post lumpectomy.  No evidence of local recurrence. Left breast is unremarkable. Back: No CVA tenderness  Abdomen: Abdomen soft, non-tender and obese. Active bowel sounds in all quadrants. No evidence of abdominal masses.  Extremities:  No bilateral cyanosis, edema, or clubbing.    LAB RESULTS: Lab Results  Component Value Date   WBC 5.4 10/20/2012   NEUTROABS 3.6 10/20/2012   HGB 13.6 10/20/2012   HCT 40.6 10/20/2012   MCV 89.0 10/20/2012   PLT 195 10/20/2012      Chemistry      Component Value Date/Time   NA 140 10/20/2012 0906   NA 141 02/17/2012 1015   K 4.4 10/20/2012 0906   K 4.4 02/17/2012 1015   CL 105 10/20/2012 0906   CL 103 02/17/2012 1015   CO2 27 10/20/2012 0906   CO2 27 02/17/2012 1015   BUN 20.0 10/20/2012 0906   BUN 11 02/17/2012 1015   CREATININE 0.8 10/20/2012 0906   CREATININE 0.82 02/17/2012 1015      Component Value Date/Time   CALCIUM 9.3 10/20/2012 0906   CALCIUM 9.6 02/17/2012 1015   ALKPHOS 153* 10/20/2012 0906   ALKPHOS 146* 02/17/2012 1015   AST 28 10/20/2012 0906   AST 31 02/17/2012 1015   ALT 43 10/20/2012 0906   ALT 40* 02/17/2012 1015   BILITOT 0.30 10/20/2012 0906   BILITOT 0.4 02/17/2012 1015       Lab Results  Component Value Date   LABCA2 28 10/20/2012    No components found with this basename: [id number]    No results found for this basename: INR:1;PROTIME:1 in the last 168 hours  Urinalysis No results found for this basename: colorurine,  appearanceur,  labspec,  phurine,  glucoseu,  hgbur,  bilirubinur,  ketonesur,   proteinur,  urobilinogen,  nitrite,  leukocytesur   STUDIES: Bone density obtained 08/20/2011 showed osteoporosis.  Scheduled for future bone density in November 2014.  MRI 03/2012:  7 mm rim enhancing lesion within the inferior subareolar portion of the left breast most consistent with a benign cyst. No findings worrisome for recurrent tumor developing malignancy.   ASSESSMENT: 57 year old BRCA 1-2 and p-53 negative  female status post right lumpectomy and sentinel lymph node sampling June 2012.  Final path revealed a T1a N0 invasive ductal carcinoma, grade 3, estrogen and progesterone receptor positive, HER2/neu negative. She completed radiation September 2012. She has been on letrozole since July 2012 with good tolerance.   PLAN: She is doing very well.  We will continue the letrozole for 5 years. We will repeat a bone density November of 2014.  Until that time, she is to continue her vitamin D and calcium supplementation and her exercise regimen of walking and YOGA.  She will see Dr. Nehemiah Settle around September or October and see [place] again in 6 months. She will see Dr. Algie Coffer in April of this year for GYN care.  She will be scheduled for her mammogram and yearly MRI due to her strong family history and history of dense breasts at her next visit in six months.  CROSS, MELISSA DEAL    10/27/2012

## 2012-10-27 NOTE — Telephone Encounter (Signed)
gv pt appt schedule for June 2014 and bone density for 08/20/13 @ 9:30am @ [facility]. Per 1/14 pof bone density now. Per [place] and pt bone density not due again until November 2014. Checked w/Melissa Cross (APP) who saw pt today and bone density should be November 2014.

## 2012-10-27 NOTE — Patient Instructions (Addendum)
Doing well.  Continue Letrozole as ordered.  Plan for bone density scan in November 2014.  Follow up in 6 months.

## 2012-11-11 ENCOUNTER — Other Ambulatory Visit: Payer: Self-pay | Admitting: *Deleted

## 2012-11-11 DIAGNOSIS — C50919 Malignant neoplasm of unspecified site of unspecified female breast: Secondary | ICD-10-CM

## 2012-11-11 MED ORDER — LETROZOLE 2.5 MG PO TABS: 2.5000 mg | ORAL_TABLET | Freq: Every day | ORAL | Status: DC

## 2013-03-10 ENCOUNTER — Encounter (HOSPITAL_COMMUNITY): Payer: Self-pay | Admitting: Emergency Medicine

## 2013-03-10 ENCOUNTER — Emergency Department (HOSPITAL_COMMUNITY)
Admission: EM | Admit: 2013-03-10 | Discharge: 2013-03-10 | Disposition: A | Discharge status: Home / Self Care | Payer: BC Managed Care – PPO | Attending: Emergency Medicine | Admitting: Emergency Medicine

## 2013-03-10 ENCOUNTER — Emergency Department (HOSPITAL_COMMUNITY): Payer: BC Managed Care – PPO

## 2013-03-10 DIAGNOSIS — Z8619 Personal history of other infectious and parasitic diseases: Secondary | ICD-10-CM | POA: Insufficient documentation

## 2013-03-10 DIAGNOSIS — R079 Chest pain, unspecified: Secondary | ICD-10-CM

## 2013-03-10 DIAGNOSIS — Z880 Allergy status to penicillin: Secondary | ICD-10-CM | POA: Insufficient documentation

## 2013-03-10 DIAGNOSIS — Z79899 Other long term (current) drug therapy: Secondary | ICD-10-CM | POA: Insufficient documentation

## 2013-03-10 DIAGNOSIS — G4700 Insomnia, unspecified: Secondary | ICD-10-CM | POA: Insufficient documentation

## 2013-03-10 DIAGNOSIS — Z853 Personal history of malignant neoplasm of breast: Secondary | ICD-10-CM | POA: Insufficient documentation

## 2013-03-10 DIAGNOSIS — Z8719 Personal history of other diseases of the digestive system: Secondary | ICD-10-CM | POA: Insufficient documentation

## 2013-03-10 DIAGNOSIS — R110 Nausea: Secondary | ICD-10-CM | POA: Insufficient documentation

## 2013-03-10 LAB — COMPREHENSIVE METABOLIC PANEL
Alkaline Phosphatase: 131 U/L — ABNORMAL HIGH (ref 39–117)
BUN: 20 mg/dL (ref 6–23)
Chloride: 100 mEq/L (ref 96–112)
Creatinine, Ser: 0.69 mg/dL (ref 0.50–1.10)
GFR calc Af Amer: >90 mL/min (ref >90)
GFR calc non Af Amer: >90 mL/min (ref >90)
Glucose, Bld: 101 mg/dL — ABNORMAL HIGH (ref 70–99)
Potassium: 3.9 mEq/L (ref 3.5–5.1)
Total Bilirubin: 0.3 mg/dL (ref 0.3–1.2)

## 2013-03-10 LAB — CBC WITH DIFFERENTIAL/PLATELET
Eosinophils Absolute: 0.1 10*3/uL (ref 0.0–0.7)
HCT: 43.1 % (ref 36.0–46.0)
Hemoglobin: 14.2 g/dL (ref 12.0–15.0)
Lymphs Abs: 1.3 10*3/uL (ref 0.7–4.0)
MCH: 29.1 pg (ref 26.0–34.0)
MCHC: 32.9 g/dL (ref 30.0–36.0)
Monocytes Absolute: 0.3 10*3/uL (ref 0.1–1.0)
Monocytes Relative: 4 % (ref 3–12)
Neutro Abs: 6.3 10*3/uL (ref 1.7–7.7)
Neutrophils Relative %: 78 % — ABNORMAL HIGH (ref 43–77)
RBC: 4.88 MIL/uL (ref 3.87–5.11)

## 2013-03-10 LAB — POCT I-STAT TROPONIN I: Troponin i, poc: 0 ng/mL (ref 0.00–0.08)

## 2013-03-10 MED ORDER — SODIUM CHLORIDE 0.9 % IV BOLUS (SEPSIS)
1000.0000 mL | Freq: Once | INTRAVENOUS | Status: AC
  Administered 2013-03-10: 1000 mL via INTRAVENOUS

## 2013-03-10 MED ORDER — ASPIRIN 325 MG PO TABS
325.0000 mg | ORAL_TABLET | Freq: Once | ORAL | Status: AC
  Administered 2013-03-10: 325 mg via ORAL
  Filled 2013-03-10: qty 1

## 2013-03-10 MED ORDER — ONDANSETRON HCL 4 MG/2ML IJ SOLN
4.0000 mg | Freq: Once | INTRAMUSCULAR | Status: AC
  Administered 2013-03-10: 4 mg via INTRAVENOUS
  Filled 2013-03-10: qty 2

## 2013-03-10 MED ORDER — KETOROLAC TROMETHAMINE 30 MG/ML IJ SOLN
30.0000 mg | Freq: Once | INTRAMUSCULAR | Status: AC
  Administered 2013-03-10: 30 mg via INTRAVENOUS
  Filled 2013-03-10: qty 1

## 2013-03-10 NOTE — ED Notes (Signed)
MD at bedside. 

## 2013-03-10 NOTE — ED Notes (Signed)
Patient transported to X-ray 

## 2013-03-10 NOTE — ED Provider Notes (Signed)
History     CSN: [id number]  Arrival date & time 03/10/13  0820   First MD Initiated Contact with Patient 03/10/13 [phone]      Chief Complaint  Patient presents with  . Chest Pain    (Consider location/radiation/quality/duration/timing/severity/associated sxs/prior treatment) The history is provided by the patient.  Crystal [name] is a 57 y.o. female history of small bowel obstruction, liver mass here with chest pain. Left-sided chest pain with nausea started early this morning. She said it sometimes goes up to her left shoulder and left jaw and was intermittent. Denies any shortness of breath or abdominal pain. However she feels a little nauseous but did not vomit. Denies any abdominal pain and is still passing gas. She took some Pepcid but did not feel better. No hx of DVT/PE. Her father has CAD so she is concerned that she has heart attack. She has no personal hx of CAD or stents and has PMD f/u. No stress test in the past.     Past Medical History  Diagnosis Date  . History of small bowel obstruction   . Liver masses   . Insomnia   . Cancer     breast    Past Surgical History  Procedure Laterality Date  . Liver surgery    . Breast surgery    . Laparotomies    . Hernia repair      incisional hernia repair x3    Family History  Problem Relation Age of Onset  . Ovarian cancer Mother   . Breast cancer Maternal Grandmother   . Stomach cancer Maternal Grandfather     History  Substance Use Topics  . Smoking status: Never Smoker   . Smokeless tobacco: Not on file  . Alcohol Use: 1.2 oz/week    2 Glasses of wine per week    OB History   Grav Para Term Preterm Abortions TAB SAB Ect Mult Living                  Review of Systems  Cardiovascular: Positive for chest pain.  All other systems reviewed and are negative.    Allergies  Promethazine hcl and Penicillins  Home Medications   Current Outpatient Rx  Name  Route  Sig  Dispense  Refill  . b complex  vitamins tablet   Oral   Take 1 tablet by mouth daily.           . calcium-vitamin D (OSCAL WITH D) 500-200 MG-UNIT per tablet   Oral   Take 1 tablet by mouth daily.           . calcium-vitamin D (OSCAL WITH D) 500-200 MG-UNIT per tablet   Oral   Take 2 tablets by mouth daily.         . famotidine (PEPCID AC) 10 MG chewable tablet   Oral   Chew 10 mg by mouth daily as needed for heartburn.         . fish oil-omega-3 fatty acids 1000 MG capsule   Oral   Take 2 g by mouth daily.           Marland Kitchen glucosamine-chondroitin 500-400 MG tablet   Oral   Take 1 tablet by mouth 3 (three) times daily.           Marland Kitchen ibuprofen (ADVIL,MOTRIN) 100 MG chewable tablet   Oral   Chew 100 mg by mouth every 8 (eight) hours as needed.           Marland Kitchen  letrozole (FEMARA) 2.5 MG tablet   Oral   Take 1 tablet (2.5 mg total) by mouth daily.   90 tablet   1   . Multiple Vitamins-Minerals (MULTIVITAMIN WITH MINERALS) tablet   Oral   Take 1 tablet by mouth daily.           Marland Kitchen Specialty Vitamins Products (MAGNESIUM, AMINO ACID CHELATE,) 133 MG tablet   Oral   Take 1 tablet by mouth 2 (two) times daily.             BP 105/62  Pulse 63  Temp(Src) 98.1 F (36.7 C) (Oral)  Resp 16  Ht 5\' 6"  (1.676 m)  Wt 135 lb (61.236 kg)  BMI 21.8 kg/m2  SpO2 98%  Physical Exam  Nursing note and vitals reviewed. Constitutional: She is oriented to person, place, and time. She appears well-developed and well-nourished.  HENT:  Head: Normocephalic.  Mouth/Throat: Oropharynx is clear and moist.  Eyes: Conjunctivae are normal. Pupils are equal, round, and reactive to light.  Neck: Normal range of motion. Neck supple.  Cardiovascular: Normal rate, regular rhythm and normal heart sounds.   Pulmonary/Chest: Effort normal and breath sounds normal. No respiratory distress. She has no wheezes. She has no rales. She exhibits no tenderness.  Abdominal: Soft. Bowel sounds are normal. She exhibits no distension.  There is no tenderness. There is no rebound and no guarding.  Musculoskeletal: Normal range of motion. She exhibits no edema and no tenderness.  Neurological: She is alert and oriented to person, place, and time.  Skin: Skin is warm and dry.  Psychiatric: She has a normal mood and affect. Her behavior is normal. Judgment and thought content normal.    ED Course  Procedures (including critical care time)  Labs Reviewed  CBC WITH DIFFERENTIAL - Abnormal; Notable for the following:    Neutrophils Relative % 78 (*)    All other components within normal limits  COMPREHENSIVE METABOLIC PANEL - Abnormal; Notable for the following:    Glucose, Bld 101 (*)    ALT 36 (*)    Alkaline Phosphatase 131 (*)    All other components within normal limits  POCT I-STAT TROPONIN I  POCT I-STAT TROPONIN I   Dg Abd Acute W/chest  03/10/2013   *RADIOLOGY REPORT*  Clinical Data: Chest pain, nausea  ACUTE ABDOMEN SERIES (ABDOMEN 2 VIEW & CHEST 1 VIEW)  Comparison: None  Findings: Normal heart size, mediastinal contours, and pulmonary vascularity. Lungs clear. No pleural effusion or pneumothorax. Perihepatic surgical clips in right upper quadrant, by EMR prior liver surgery. Nonobstructive bowel gas pattern. No bowel dilatation, bowel wall thickening or free intraperitoneal air. Bones demineralized. No urinary tract calcification.  IMPRESSION: No acute abnormalities.   Original Report Authenticated By: Ulyses Southward, M.D.     No diagnosis found.   Date: 03/10/2013  Rate: 74  Rhythm: normal sinus rhythm  QRS Axis: normal  Intervals: normal  ST/T Wave abnormalities: nonspecific ST changes  Conduction Disutrbances:none  Narrative Interpretation:   Old EKG Reviewed: unchanged    MDM  [name] is a 57 y.o. female here with chest pain. Given family hx and recent onset will get trop x 2. Not concerned for PE or dissection. Will get labs and cxr and reassess.   12:34 PM Trop neg x 2. Labs otherwise  unremarkable. Pain free. Will f/u with PMD with possible stress test.          Richardean Canal, MD 03/10/13 1234

## 2013-03-10 NOTE — ED Notes (Signed)
Left sided cp started last night nausea started this am  No sob pain going thru to back and shoulder and left jaw

## 2013-03-12 ENCOUNTER — Telehealth: Payer: Self-pay | Admitting: Oncology

## 2013-03-12 NOTE — Telephone Encounter (Signed)
Pt called and moved 6/12 appt to 5/30 due to out of town. Pt will keep scheduled appt for lb 6/2 and pt will r/s mammo appt.

## 2013-03-15 ENCOUNTER — Other Ambulatory Visit (HOSPITAL_BASED_OUTPATIENT_CLINIC_OR_DEPARTMENT_OTHER): Payer: BC Managed Care – PPO

## 2013-03-15 DIAGNOSIS — C50919 Malignant neoplasm of unspecified site of unspecified female breast: Secondary | ICD-10-CM

## 2013-03-15 LAB — CBC WITH DIFFERENTIAL/PLATELET
BASO%: 0.7 % (ref 0.0–2.0)
EOS%: 1 % (ref 0.0–7.0)
HCT: 41.8 % (ref 34.8–46.6)
LYMPH%: 29.2 % (ref 14.0–49.7)
MCH: 28.7 pg (ref 25.1–34.0)
MCHC: 32.9 g/dL (ref 31.5–36.0)
MCV: 87.3 fL (ref 79.5–101.0)
MONO#: 0.3 10*3/uL (ref 0.1–0.9)
MONO%: 5.9 % (ref 0.0–14.0)
NEUT%: 63.2 % (ref 38.4–76.8)
Platelets: 201 10*3/uL (ref 145–400)

## 2013-03-15 LAB — COMPREHENSIVE METABOLIC PANEL (CC13)
ALT: 32 U/L (ref 0–55)
Alkaline Phosphatase: 125 U/L (ref 40–150)
CO2: 27 mEq/L (ref 22–29)
Creatinine: 0.8 mg/dL (ref 0.6–1.1)
Total Bilirubin: 0.43 mg/dL (ref 0.20–1.20)

## 2013-03-23 ENCOUNTER — Ambulatory Visit: Payer: BC Managed Care – PPO | Admitting: Oncology

## 2013-03-25 ENCOUNTER — Ambulatory Visit: Payer: BC Managed Care – PPO | Admitting: Oncology

## 2013-04-12 ENCOUNTER — Ambulatory Visit (HOSPITAL_BASED_OUTPATIENT_CLINIC_OR_DEPARTMENT_OTHER): Payer: BC Managed Care – PPO | Admitting: Oncology

## 2013-04-12 ENCOUNTER — Telehealth: Payer: Self-pay | Admitting: *Deleted

## 2013-04-12 VITALS — BP 118/78 | HR 74 | Temp 98.3°F | Resp 20 | Ht 66.0 in | Wt 137.2 lb

## 2013-04-12 DIAGNOSIS — C50911 Malignant neoplasm of unspecified site of right female breast: Secondary | ICD-10-CM

## 2013-04-12 DIAGNOSIS — C50919 Malignant neoplasm of unspecified site of unspecified female breast: Secondary | ICD-10-CM

## 2013-04-12 MED ORDER — LETROZOLE 2.5 MG PO TABS: 2.5000 mg | ORAL_TABLET | Freq: Every day | ORAL | Status: DC

## 2013-04-12 NOTE — Telephone Encounter (Signed)
appts made and printed...td 

## 2013-04-12 NOTE — Progress Notes (Signed)
ID: Crystal [name]   DOB: 03/27/1956  MR#: [id number]  YQM#:[id number]  PCP: Crystal Apo, MD GYN: SU:  OTHER MD: Crystal [name], Crystal [name]   HISTORY OF PRESENT ILLNESS: The patient had bilateral diagnostic mammography 02/12/2011.  She has a history of cystic changes noted on prior studies in April 2011 and April 2010.  On the Feb 12, 2011 study there were new calcifications identified superiorly in the right breast, without any obvious mass.  The patient was brought back for further studies, 02/19/2011 and Dr. Tilda [name] was able to localize the calcifications under stereotactic guidance.  The biopsy ([id number]) showed high-grade ductal carcinoma with papillary features which was 70% estrogen receptor positive, 0% progesterone receptor positive.  There was no definitive invasion and therefore further studies were not performed.   Bilateral breast MRIs were obtained Feb 26, 2011.  This showed marked nodular enhancement throughout both breasts, and in the upper central portion there was a post biopsy area of change measuring up to 3.7 cm.  Most of this is a fluid cavity with a thin rim of enhancement.  There were no suspicious areas in the left breast.  No internal mammary or axillary lymph nodes.  Of course, the patient has a history of hepatic adenomas and these were noted incidentally on the MRI. Her definitive surgical treatment and subsequent history areas detailed below.  INTERVAL HISTORY: Crystal [name] returns today for followup of her breast cancer. The history is significant for her having had some atypical chest pain which took her to the emergency room. Lab work and EKG were did not suggestive of an acute MI, but she has been set up for a stress test, coming up next week. Also she had her colonoscopy, which was fine.  REVIEW OF SYSTEMS: She is walking 3 or 4 times a week, 3-1/2 miles at a time. She has no chest pain pressure or palpitations while doing this. She does have reflux problems which  she treats with occasional Pepcid. She does not have the arthralgias/myalgias that people sometimes get from letrozole. She is having hot flashes, which she describes as moderate. A detailed review of systems was otherwise noncontributory  PAST MEDICAL HISTORY: Past Medical History  Diagnosis Date  . History of small bowel obstruction   . Liver masses   . Insomnia   . Cancer     breast  history of small-bowel obstruction with partial colectomy March 2001.  The patient has a history of hepatic adenomas.  She has had liver resections x2.  The data for this is at [facility] and we will try to obtain it.  She has also had incisional hernia repair.  She has a history of osteopenia, and she has a history of multiple anti-red cell antibodies making a transfusion difficult.  The patient tells me that she has a history of low calcium and a tendency to get liver injections.  PAST SURGICAL HISTORY: Past Surgical History  Procedure Laterality Date  . Liver surgery    . Breast surgery    . Laparotomies    . Hernia repair      incisional hernia repair x3    FAMILY HISTORY Family History  Problem Relation Age of Onset  . Ovarian cancer Mother   . Breast cancer Maternal Grandmother   . Stomach cancer Maternal Grandfather   The patient's mother died from ovarian cancer at the age of 44.  The patient's father is alive at age 59.  She has 2 brothers, no sisters.  A  maternal grandmother was diagnosed with breast cancer at the age of 22, and the maternal grandfather had stomach cancer at the age of 36.  GYNECOLOGIC HISTORY: The patient had menarche at age 53.  Her last menstrual period was in 2005.  She never used hormone replacement.  She used birth control briefly to control bleeding prior to 1980.  She was 57 years old at the birth of her first child.  She started having mammograms at age 17 because of a strong family history.  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  Her husband, Crystal [name], present today, is a retired Warehouse manager from a [employer] production [employer].  Son, Crystal [name], lives in [place] and works for that [employer].  Daughter, Crystal [name], lives in [place] and is a homemaker.   ADVANCED DIRECTIVES: in place  HEALTH MAINTENANCE: History  Substance Use Topics  . Smoking status: Never Smoker   . Smokeless tobacco: Not on file  . Alcohol Use: 1.2 oz/week    2 Glasses of wine per week     Colonoscopy:  PAP:  Bone density:  Lipid panel:  Allergies  Allergen Reactions  . Promethazine Hcl Other (See Comments)    Causes shaking  . Penicillins Rash    Current Outpatient Prescriptions  Medication Sig Dispense Refill  . b complex vitamins tablet Take 1 tablet by mouth daily.        . calcium-vitamin D (OSCAL WITH D) 500-200 MG-UNIT per tablet Take 1 tablet by mouth daily.        . calcium-vitamin D (OSCAL WITH D) 500-200 MG-UNIT per tablet Take 2 tablets by mouth daily.      . famotidine (PEPCID AC) 10 MG chewable tablet Chew 10 mg by mouth daily as needed for heartburn.      . fish oil-omega-3 fatty acids 1000 MG capsule Take 2 g by mouth daily.        Marland Kitchen glucosamine-chondroitin 500-400 MG tablet Take 1 tablet by mouth 3 (three) times daily.        Marland Kitchen ibuprofen (ADVIL,MOTRIN) 100 MG chewable tablet Chew 100 mg by mouth every 8 (eight) hours as needed.        Marland Kitchen letrozole (FEMARA) 2.5 MG tablet Take 1 tablet (2.5 mg total) by mouth daily.  90 tablet  1  . Multiple Vitamins-Minerals (MULTIVITAMIN WITH MINERALS) tablet Take 1 tablet by mouth daily.        Marland Kitchen Specialty Vitamins Products (MAGNESIUM, AMINO ACID CHELATE,) 133 MG tablet Take 1 tablet by mouth 2 (two) times daily.         No current facility-administered medications for this visit.    OBJECTIVE: Middle-aged white woman who looks well Filed Vitals:   04/12/13 0932  BP: 118/78  Pulse: 74  Temp: 98.3 F (36.8 [name])  Resp: 20     Body mass index is 22.16 kg/(m^2).     ECOG FS: 0  Sclerae unicteric Oropharynx clear No peripheral adenopathy Lungs no rales or rhonchi Heart regular rate and rhythm Abd benign MSK no focal spinal tenderness, no peripheral edema Neuro: nonfocal, well oriented, positive affect Breasts: Right breast is status post lumpectomy; there is no evidence of local recurrence. The right axilla is benign Left breast is unremarkable  LAB RESULTS: Lab Results  Component Value Date   WBC 5.3 03/15/2013   NEUTROABS 3.3 03/15/2013   HGB 13.7 03/15/2013   HCT 41.8 03/15/2013   MCV 87.3 03/15/2013  PLT 201 03/15/2013      Chemistry      Component Value Date/Time   NA 140 03/15/2013 0912   NA 139 03/10/2013 0848   K 4.0 03/15/2013 0912   K 3.9 03/10/2013 0848   CL 102 03/15/2013 0912   CL 100 03/10/2013 0848   CO2 27 03/15/2013 0912   CO2 23 03/10/2013 0848   BUN 14.9 03/15/2013 0912   BUN 20 03/10/2013 0848   CREATININE 0.8 03/15/2013 0912   CREATININE 0.69 03/10/2013 0848      Component Value Date/Time   CALCIUM 10.3 03/15/2013 0912   CALCIUM 10.0 03/10/2013 0848   ALKPHOS 125 03/15/2013 0912   ALKPHOS 131* 03/10/2013 0848   AST 26 03/15/2013 0912   AST 29 03/10/2013 0848   ALT 32 03/15/2013 0912   ALT 36* 03/10/2013 0848   BILITOT 0.43 03/15/2013 0912   BILITOT 0.3 03/10/2013 0848       Lab Results  Component Value Date   LABCA2 28 10/20/2012    No components found with this basename: [id number]    No results found for this basename: INR,  in the last 168 hours  Urinalysis No results found for this basename: colorurine,  appearanceur,  labspec,  phurine,  glucoseu,  hgbur,  bilirubinur,  ketonesur,  proteinur,  urobilinogen,  nitrite,  leukocytesur    STUDIES: Bone density obtained 08/20/2011 showed osteoporosis; mammography this month at [facility] showed scattered fibroglandular densities. There were no suspicious findings  ASSESSMENT: 57 y.o. and in in in the and her labs so she can dear the she needs is a the BRCA 1-2 and p-53 negative [place]  woman status post right lumpectomy and sentinel lymph node sampling June 2012 for a T1a N0 invasive ductal carcinoma, grade 3, estrogen and progesterone receptor positive, HER2/neu negative. She completed radiation September 2012. She has been on letrozole since July 2012 with good tolerance.   PLAN: As Crystal [name] a gross older her breasts become easier to image. In addition we are obtaining 3-D views (tomography), which further increases sensitivity. Accordingly I feel comfortable not scheduling her for repeat MRI. She knows her prognosis is excellent. She is going to see [place] again in one year. She knows to call for any problems that may develop before that visit. Go to sleep  Crystal [name]    04/12/2013

## 2013-07-22 ENCOUNTER — Telehealth: Payer: Self-pay | Admitting: *Deleted

## 2013-07-22 NOTE — Telephone Encounter (Signed)
CORRECTION.Marland Kitchen The first document is the correct on  12:28pm...td

## 2013-07-22 NOTE — Telephone Encounter (Signed)
Error pevious message. gv appt for 03/29/13 labs@ 9:30am , and ov @ 10:30am. im also mailing a letter/cal..td

## 2013-07-22 NOTE — Telephone Encounter (Signed)
sw pt informed her that [facility] will be on pal 04/12/14. gv appt for 03/29/14 labs@ 10:45am, and ov on 04/05/14 @ 10am.the patient is aware...td

## 2014-01-11 ENCOUNTER — Telehealth: Payer: Self-pay | Admitting: Oncology

## 2014-01-11 NOTE — Telephone Encounter (Signed)
june appts cx'd per 3/29 pof. lmonvm for new appts for aug. schedule mailed.

## 2014-01-17 ENCOUNTER — Telehealth: Payer: Self-pay | Admitting: Oncology

## 2014-01-17 NOTE — Telephone Encounter (Signed)
m °

## 2014-03-29 ENCOUNTER — Other Ambulatory Visit: Payer: BC Managed Care – PPO

## 2014-04-05 ENCOUNTER — Other Ambulatory Visit: Payer: BC Managed Care – PPO

## 2014-04-05 ENCOUNTER — Encounter: Payer: BC Managed Care – PPO | Admitting: Oncology

## 2014-04-11 ENCOUNTER — Other Ambulatory Visit: Payer: Self-pay | Admitting: *Deleted

## 2014-04-11 DIAGNOSIS — C50919 Malignant neoplasm of unspecified site of unspecified female breast: Secondary | ICD-10-CM

## 2014-04-11 DIAGNOSIS — C50911 Malignant neoplasm of unspecified site of right female breast: Secondary | ICD-10-CM

## 2014-04-11 MED ORDER — LETROZOLE 2.5 MG PO TABS: 2.5000 mg | ORAL_TABLET | Freq: Every day | ORAL | Status: DC

## 2014-04-12 ENCOUNTER — Ambulatory Visit: Payer: BC Managed Care – PPO | Admitting: Oncology

## 2014-04-28 ENCOUNTER — Other Ambulatory Visit: Payer: Self-pay

## 2014-04-28 ENCOUNTER — Telehealth: Payer: Self-pay

## 2014-04-28 DIAGNOSIS — C50911 Malignant neoplasm of unspecified site of right female breast: Secondary | ICD-10-CM

## 2014-04-28 NOTE — Progress Notes (Unsigned)
Per pt - mammo scheduled at [facility] 04/29/14.  Order entered and faxed to [facility].  Sent to scan.

## 2014-04-28 NOTE — Telephone Encounter (Signed)
Order faxed to [facility] for mammogram.  Sent to scan.

## 2014-05-30 ENCOUNTER — Other Ambulatory Visit (HOSPITAL_BASED_OUTPATIENT_CLINIC_OR_DEPARTMENT_OTHER): Payer: BC Managed Care – PPO

## 2014-05-30 DIAGNOSIS — C50919 Malignant neoplasm of unspecified site of unspecified female breast: Secondary | ICD-10-CM

## 2014-05-30 LAB — COMPREHENSIVE METABOLIC PANEL (CC13)
ALK PHOS: 114 U/L (ref 40–150)
ALT: 38 U/L (ref 0–55)
AST: 26 U/L (ref 5–34)
Albumin: 4 g/dL (ref 3.5–5.0)
Anion Gap: 10 mEq/L (ref 3–11)
BILIRUBIN TOTAL: 0.2 mg/dL (ref 0.20–1.20)
BUN: 13.5 mg/dL (ref 7.0–26.0)
CALCIUM: 9.5 mg/dL (ref 8.4–10.4)
CHLORIDE: 103 meq/L (ref 98–109)
CO2: 25 mEq/L (ref 22–29)
CREATININE: 0.9 mg/dL (ref 0.6–1.1)
Glucose: 78 mg/dl (ref 70–140)
Potassium: 4 mEq/L (ref 3.5–5.1)
Sodium: 139 mEq/L (ref 136–145)
Total Protein: 7.2 g/dL (ref 6.4–8.3)

## 2014-05-30 LAB — CBC & DIFF AND RETIC
BASO%: 0.3 % (ref 0.0–2.0)
BASOS ABS: 0 10*3/uL (ref 0.0–0.1)
EOS%: 2 % (ref 0.0–7.0)
Eosinophils Absolute: 0.1 10*3/uL (ref 0.0–0.5)
HCT: 41.1 % (ref 34.8–46.6)
HEMOGLOBIN: 13.3 g/dL (ref 11.6–15.9)
IMMATURE RETIC FRACT: 5.1 % (ref 1.60–10.00)
LYMPH#: 1.6 10*3/uL (ref 0.9–3.3)
LYMPH%: 26.4 % (ref 14.0–49.7)
MCH: 29 pg (ref 25.1–34.0)
MCHC: 32.4 g/dL (ref 31.5–36.0)
MCV: 89.7 fL (ref 79.5–101.0)
MONO#: 0.3 10*3/uL (ref 0.1–0.9)
MONO%: 5.6 % (ref 0.0–14.0)
NEUT%: 65.7 % (ref 38.4–76.8)
NEUTROS ABS: 3.9 10*3/uL (ref 1.5–6.5)
Platelets: 211 10*3/uL (ref 145–400)
RBC: 4.58 10*6/uL (ref 3.70–5.45)
RDW: 13.2 % (ref 11.2–14.5)
RETIC %: 1.17 % (ref 0.70–2.10)
RETIC CT ABS: 53.59 10*3/uL (ref 33.70–90.70)
WBC: 5.9 10*3/uL (ref 3.9–10.3)

## 2014-06-06 ENCOUNTER — Other Ambulatory Visit: Payer: BC Managed Care – PPO

## 2014-06-09 ENCOUNTER — Telehealth: Payer: Self-pay | Admitting: Oncology

## 2014-06-09 NOTE — Telephone Encounter (Signed)
Cld & spoke to pt in re to appt chge-gave time & date-pt understood-req to spk w/nurse in re to prescription-trans to [id number]

## 2014-06-13 ENCOUNTER — Ambulatory Visit: Payer: BC Managed Care – PPO | Admitting: Oncology

## 2014-07-08 ENCOUNTER — Telehealth: Payer: Self-pay | Admitting: Oncology

## 2014-07-08 ENCOUNTER — Ambulatory Visit (HOSPITAL_BASED_OUTPATIENT_CLINIC_OR_DEPARTMENT_OTHER): Payer: BC Managed Care – PPO | Admitting: Oncology

## 2014-07-08 VITALS — BP 117/54 | HR 68 | Temp 97.3°F | Resp 18 | Ht 66.0 in | Wt 137.7 lb

## 2014-07-08 DIAGNOSIS — C50919 Malignant neoplasm of unspecified site of unspecified female breast: Secondary | ICD-10-CM

## 2014-07-08 DIAGNOSIS — C50911 Malignant neoplasm of unspecified site of right female breast: Secondary | ICD-10-CM

## 2014-07-08 DIAGNOSIS — Z170 Estrogen receptor positive status [ER+]: Secondary | ICD-10-CM

## 2014-07-08 MED ORDER — LETROZOLE 2.5 MG PO TABS: 2.5000 mg | ORAL_TABLET | Freq: Every day | ORAL | Status: DC

## 2014-07-08 NOTE — Telephone Encounter (Signed)
, °

## 2014-07-08 NOTE — Progress Notes (Signed)
ID: Crystal [name]   DOB: 1955/10/24  MR#: [id number]  RWE#:[id number]  PCP: Crystal Hams, MD GYN: SU:  OTHER MD: Crystal [name], Crystal [name]   HISTORY OF PRESENT ILLNESS:   From the original intake note:  The patient had bilateral diagnostic mammography 02/12/2011.  She has a history of cystic changes noted on prior studies in April 2011 and April 2010.  On the Feb 12, 2011 study there were new calcifications identified superiorly in the right breast, without any obvious mass.  The patient was brought back for further studies, 02/19/2011 and Dr. Marcelo [name] was able to localize the calcifications under stereotactic guidance.  The biopsy ([id number]) showed high-grade ductal carcinoma with papillary features which was 70% estrogen receptor positive, 0% progesterone receptor positive.  There was no definitive invasion and therefore further studies were not performed.   Bilateral breast MRIs were obtained Feb 26, 2011.  This showed marked nodular enhancement throughout both breasts, and in the upper central portion there was a post biopsy area of change measuring up to 3.7 cm.  Most of this is a fluid cavity with a thin rim of enhancement.  There were no suspicious areas in the left breast.  No internal mammary or axillary lymph nodes.  Of course, the patient has a history of hepatic adenomas and these were noted incidentally on the MRI. Her definitive surgical treatment and subsequent history are as detailed below.  INTERVAL HISTORY: Crystal [name] returns today for followup of her breast cancer. The interval history is unremarkable. She continues on letrozole with good tolerance. Hot flashes and vaginal dryness are not major concerns. She does think she is a little bit tired due to the medication. Nevertheless she remains very active. She is just completing adenomas project for [facility] (which provides fantastic retreats for stage IV patients and their families at no cost).   REVIEW OF  SYSTEMS: She feels she bruises easily. She has a little bit of insomnia problems. She takes turmeric for the minimal arthritis symptoms that she has and that seems to be working. A detailed review of systems today was otherwise noncontributory  PAST MEDICAL HISTORY: Past Medical History  Diagnosis Date  . History of small bowel obstruction   . Liver masses   . Insomnia   . Cancer     breast  history of small-bowel obstruction with partial colectomy March 2001.  The patient has a history of hepatic adenomas.  She has had liver resections x2.  The data for this is at [facility] and we will try to obtain it.  She has also had incisional hernia repair.  She has a history of osteopenia, and she has a history of multiple anti-red cell antibodies making a transfusion difficult.  The patient tells me that she has a history of low calcium and a tendency to get liver injections.  PAST SURGICAL HISTORY: Past Surgical History  Procedure Laterality Date  . Liver surgery    . Breast surgery    . Laparotomies    . Hernia repair      incisional hernia repair x3    FAMILY HISTORY Family History  Problem Relation Age of Onset  . Ovarian cancer Mother   . Breast cancer Maternal Grandmother   . Stomach cancer Maternal Grandfather   The patient's mother died from ovarian cancer at the age of 36.  The patient's father is alive at age 78.  She has 2 brothers, no sisters.  A maternal grandmother was diagnosed  with breast cancer at the age of 5, and the maternal grandfather had stomach cancer at the age of 61.  GYNECOLOGIC HISTORY: The patient had menarche at age 83.  Her [name] menstrual period was in 2005.  She never used hormone replacement.  She used birth control briefly to control bleeding prior to 1980.  She was 58 years old at the birth of her first child.  She started having mammograms at age 89 because of a strong family history.  SOCIAL HISTORY: Crystal [name] works as a Programmer, applications for [employer].  Her husband, Crystal [name], is a retired Engineer, maintenance from a [employer] locally.  Son, Crystal [name], lives in [place] and works for that [employer]  Daughter, Crystal [name], lives in [place] and is a homemaker.   ADVANCED DIRECTIVES: in place  HEALTH MAINTENANCE: History  Substance Use Topics  . Smoking status: Never Smoker   . Smokeless tobacco: Not on file  . Alcohol Use: 1.2 oz/week    2 Glasses of wine per week     Colonoscopy:  PAP:  Bone density:  Lipid panel:  Allergies  Allergen Reactions  . Promethazine Hcl Other (See Comments)    Causes shaking  . Penicillins Rash    Current Outpatient Prescriptions  Medication Sig Dispense Refill  . b complex vitamins tablet Take 1 tablet by mouth daily.        . calcium-vitamin D (OSCAL WITH D) 500-200 MG-UNIT per tablet Take 1 tablet by mouth daily.        . calcium-vitamin D (OSCAL WITH D) 500-200 MG-UNIT per tablet Take 2 tablets by mouth daily.      . famotidine (PEPCID AC) 10 MG chewable tablet Chew 10 mg by mouth daily as needed for heartburn.      . fish oil-omega-3 fatty acids 1000 MG capsule Take 2 g by mouth daily.        Marland Kitchen glucosamine-chondroitin 500-400 MG tablet Take 1 tablet by mouth 3 (three) times daily.        Marland Kitchen ibuprofen (ADVIL,MOTRIN) 100 MG chewable tablet Chew 100 mg by mouth every 8 (eight) hours as needed.        Marland Kitchen letrozole (FEMARA) 2.5 MG tablet Take 1 tablet (2.5 mg total) by mouth daily.  90 tablet  0  . Multiple Vitamins-Minerals (MULTIVITAMIN WITH MINERALS) tablet Take 1 tablet by mouth daily.        Marland Kitchen Specialty Vitamins Products (MAGNESIUM, AMINO ACID CHELATE,) 133 MG tablet Take 1 tablet by mouth 2 (two) times daily.         No current facility-administered medications for this visit.    OBJECTIVE: Middle-aged white woman in no acute distress  Filed Vitals:   07/08/14 1148  BP: 117/54  Pulse: 68  Temp: 97.3 F (36.3 C)  Resp: 18     Body mass index is 22.24 kg/(m^2).     ECOG FS: 0  Sclerae unicteri, pupils round and equal c Oropharynx clear, Dentition in good repair  No peripheral adenopathy Lungs no rales or rhonchi Heart regular rate and rhythm Abd  soft, nontender, positive bowel sounds  MSK no focal spinal tenderness, no  upper extremity lymphedema  Neuro: nonfocal, well oriented, positive affect Breasts: Right breast is status post lumpectomy; there is no evidence of local recurrence. The right axilla is benign. The left breast is unremarkable    LAB RESULTS: Lab Results  Component Value Date   WBC 5.9 05/30/2014   NEUTROABS 3.9  05/30/2014   HGB 13.3 05/30/2014   HCT 41.1 05/30/2014   MCV 89.7 05/30/2014   PLT 211 05/30/2014      Chemistry      Component Value Date/Time   NA 139 05/30/2014 0903   NA 139 03/10/2013 0848   K 4.0 05/30/2014 0903   K 3.9 03/10/2013 0848   CL 102 03/15/2013 0912   CL 100 03/10/2013 0848   CO2 25 05/30/2014 0903   CO2 23 03/10/2013 0848   BUN 13.5 05/30/2014 0903   BUN 20 03/10/2013 0848   CREATININE 0.9 05/30/2014 0903   CREATININE 0.69 03/10/2013 0848      Component Value Date/Time   CALCIUM 9.5 05/30/2014 0903   CALCIUM 10.0 03/10/2013 0848   ALKPHOS 114 05/30/2014 0903   ALKPHOS 131* 03/10/2013 0848   AST 26 05/30/2014 0903   AST 29 03/10/2013 0848   ALT 38 05/30/2014 0903   ALT 36* 03/10/2013 0848   BILITOT 0.20 05/30/2014 0903   BILITOT 0.3 03/10/2013 0848       Lab Results  Component Value Date   LABCA2 28 10/20/2012    No components found with this basename: [id number]    No results found for this basename: INR,  in the [name] 168 hours  Urinalysis No results found for this basename: colorurine,  appearanceur,  labspec,  phurine,  glucoseu,  hgbur,  bilirubinur,  ketonesur,  proteinur,  urobilinogen,  nitrite,  leukocytesur    STUDIES: Mammography at [facility] 04/29/2014 was unremarkable  Bone density obtained 08/31/2013 showed stable osteoporosis  ASSESSMENT: 58 y.o.  BRCA 1-2 and p-53 negative [place]  woman status post right lumpectomy and sentinel lymph node sampling June 2012 for a T1a N0, Stage IA  invasive ductal carcinoma, grade 3, estrogen and progesterone receptor positive, HER2/neu negative.   (1) She completed radiation September 2012.   (2) She has been on letrozole since July 2012 with good tolerance.   PLAN:  Crystal [name] is doing fine from a breast cancer point of view and she is tolerating the letrozole without unusual side effects. It is gratifying that her bone density is stable.  Today gave her information on our "intimacy and pelvic health" program". She would be a very good candidate for our survivorship clinic once that is also and running.  Otherwise the plan is to continue letrozole an additional 2 years. She'll see me a year from now. She knows to call for any problems that may develop before that visit.  Incidentally she will leave some of the pamphlets for the [facility] retreats so some of our advanced breast cancer patients may be able to participate.  MAGRINAT,GUSTAV C    07/08/2014

## 2014-08-28 ENCOUNTER — Encounter: Payer: Self-pay | Admitting: *Deleted

## 2015-01-20 ENCOUNTER — Telehealth: Payer: Self-pay | Admitting: Oncology

## 2015-01-20 NOTE — Telephone Encounter (Signed)
Due to PAL moved 9/27 f/u to 10/11. S/w patient she is aware. Lab for 07/04/15 remains the same.

## 2015-05-23 ENCOUNTER — Other Ambulatory Visit: Payer: Self-pay | Admitting: Radiology

## 2015-05-29 ENCOUNTER — Other Ambulatory Visit: Payer: Self-pay | Admitting: Surgery

## 2015-05-29 DIAGNOSIS — C50911 Malignant neoplasm of unspecified site of right female breast: Secondary | ICD-10-CM

## 2015-05-29 DIAGNOSIS — C50919 Malignant neoplasm of unspecified site of unspecified female breast: Secondary | ICD-10-CM

## 2015-05-30 ENCOUNTER — Encounter: Payer: Self-pay | Admitting: *Deleted

## 2015-05-30 ENCOUNTER — Telehealth: Payer: Self-pay | Admitting: Oncology

## 2015-05-30 NOTE — Telephone Encounter (Signed)
Confirmed appointment for 08/22

## 2015-06-05 ENCOUNTER — Ambulatory Visit (HOSPITAL_BASED_OUTPATIENT_CLINIC_OR_DEPARTMENT_OTHER): Payer: BLUE CROSS/BLUE SHIELD | Admitting: Oncology

## 2015-06-05 ENCOUNTER — Other Ambulatory Visit (HOSPITAL_BASED_OUTPATIENT_CLINIC_OR_DEPARTMENT_OTHER): Payer: BLUE CROSS/BLUE SHIELD

## 2015-06-05 VITALS — BP 123/65 | HR 78 | Temp 98.5°F | Resp 18 | Ht 66.0 in | Wt 137.8 lb

## 2015-06-05 DIAGNOSIS — M8580 Other specified disorders of bone density and structure, unspecified site: Secondary | ICD-10-CM | POA: Diagnosis not present

## 2015-06-05 DIAGNOSIS — C50411 Malignant neoplasm of upper-outer quadrant of right female breast: Secondary | ICD-10-CM

## 2015-06-05 DIAGNOSIS — Z853 Personal history of malignant neoplasm of breast: Secondary | ICD-10-CM | POA: Diagnosis not present

## 2015-06-05 DIAGNOSIS — C50919 Malignant neoplasm of unspecified site of unspecified female breast: Secondary | ICD-10-CM

## 2015-06-05 DIAGNOSIS — C50911 Malignant neoplasm of unspecified site of right female breast: Secondary | ICD-10-CM

## 2015-06-05 LAB — CBC WITH DIFFERENTIAL/PLATELET
BASO%: 0.6 % (ref 0.0–2.0)
Basophils Absolute: 0 10*3/uL (ref 0.0–0.1)
EOS%: 0.9 % (ref 0.0–7.0)
Eosinophils Absolute: 0.1 10*3/uL (ref 0.0–0.5)
HCT: 41.3 % (ref 34.8–46.6)
HGB: 13.6 g/dL (ref 11.6–15.9)
LYMPH%: 23.2 % (ref 14.0–49.7)
MCH: 29 pg (ref 25.1–34.0)
MCHC: 32.9 g/dL (ref 31.5–36.0)
MCV: 88.3 fL (ref 79.5–101.0)
MONO#: 0.3 10*3/uL (ref 0.1–0.9)
MONO%: 4.6 % (ref 0.0–14.0)
NEUT#: 5 10*3/uL (ref 1.5–6.5)
NEUT%: 70.7 % (ref 38.4–76.8)
Platelets: 231 10*3/uL (ref 145–400)
RBC: 4.68 10*6/uL (ref 3.70–5.45)
RDW: 13.5 % (ref 11.2–14.5)
WBC: 7.1 10*3/uL (ref 3.9–10.3)
lymph#: 1.6 10*3/uL (ref 0.9–3.3)

## 2015-06-05 LAB — COMPREHENSIVE METABOLIC PANEL (CC13)
ALT: 31 U/L (ref 0–55)
ANION GAP: 12 meq/L — AB (ref 3–11)
AST: 25 U/L (ref 5–34)
Albumin: 4.2 g/dL (ref 3.5–5.0)
Alkaline Phosphatase: 119 U/L (ref 40–150)
BUN: 16.9 mg/dL (ref 7.0–26.0)
CHLORIDE: 106 meq/L (ref 98–109)
CO2: 23 meq/L (ref 22–29)
CREATININE: 0.9 mg/dL (ref 0.6–1.1)
Calcium: 9.8 mg/dL (ref 8.4–10.4)
EGFR: 66 mL/min/{1.73_m2} — ABNORMAL LOW (ref >90)
GLUCOSE: 187 mg/dL — AB (ref 70–140)
Potassium: 4.6 mEq/L (ref 3.5–5.1)
SODIUM: 141 meq/L (ref 136–145)
Total Bilirubin: 0.26 mg/dL (ref 0.20–1.20)
Total Protein: 7.4 g/dL (ref 6.4–8.3)

## 2015-06-05 NOTE — Progress Notes (Signed)
ID: [name]   DOB: 1956-08-25  MR#: [id number]  VOZ#:[id number]  PCP: Kandice Hams, MD GYN: SU: [name] MD OTHER MD: Earle Gell, Daneen Schick, Arnoldo Hooker Thimmappa]   CHIEF COMPLAINT: recurrent estrogen receptor positive breast cancer   CURRENT TREATMENT: Awaiting definitive surgery   HISTORY OF PRESENT ILLNESS:   From the original intake note:  The patient had bilateral diagnostic mammography 02/12/2011.  She has a history of cystic changes noted on prior studies in April 2011 and April 2010.  On the Feb 12, 2011 study there were new calcifications identified superiorly in the right breast, without any obvious mass.  The patient was brought back for further studies, 02/19/2011 and Dr. Marcelo Baldy was able to localize the calcifications under stereotactic guidance.  The biopsy ([id number]) showed high-grade ductal carcinoma with papillary features which was 70% estrogen receptor positive, 0% progesterone receptor positive.  There was no definitive invasion and therefore further studies were not performed.   Bilateral breast MRIs were obtained Feb 26, 2011.  This showed marked nodular enhancement throughout both breasts, and in the upper central portion there was a post biopsy area of change measuring up to 3.7 cm.  Most of this is a fluid cavity with a thin rim of enhancement.  There were no suspicious areas in the left breast.  No internal mammary or axillary lymph nodes.  Of course, the patient has a history of hepatic adenomas and these were noted incidentally on the MRI. Her definitive surgical treatment and subsequent history are as detailed below.  INTERVAL HISTORY: Crystal [name] returns today for followup of her now recurrent breast cancer. On 05/22/2015 she had routine diagnostic mammography with tomography at [facility]. The breast density was category B. There was a new 1.1 cm solid mass in the right breast upper outer quadrant. Accordingly ultrasonography was performed the same day.  This confirmed a 1.1 cm round solid mass with microlobulated margins at the 12:00 position 3 cm from the nipple. By color flow imaging there was moderateas vascularity and   elastography was intermediate.  Biopsy of this mass was performed on  05/23/2015, and showed ([id number]) an invasive ductal carcinoma, grade 2, estrogen receptor 60% positive, with moderate staining intensity, progesterone receptor 0%, with an MIB-1 of 90% and HER-2 nonamplified, the signals ratio being 1.06 and the number per cell 1.85.  Her case was presented 05/31/2015 at the multidisciplinary breast cancer conference. At that point it was proposed that she would need a mastectomy, possibly to be followed by chemotherapy and then further anti-estrogens. A genetics referral was also recommended.  She now presents for further discussion.  REVIEW OF SYSTEMS: There were no specific symptoms leading to the original mammogram, which was routinely scheduled. The patient denies unusual headaches, visual changes, nausea, vomiting, stiff neck, dizziness, or gait imbalance. There has been no cough, phlegm production, or pleurisy, no chest pain or pressure, and no change in bowel or bladder habits. The patient denies fever, rash, bleeding, or unexplained weight loss.  She admits to mild fatigue, easy bruising, and some joint pains here in there which are not more intense or persistent than before. Hot flashes are moderate. A detailed review of systems was otherwise entirely negative.   PAST MEDICAL HISTORY: Past Medical History  Diagnosis Date  . History of small bowel obstruction   . Liver masses   . Insomnia   . Cancer     breast  history of small-bowel obstruction with partial colectomy March 2001.  The patient  has a history of hepatic adenomas.  She has had liver resections x2.  The data for this is at [facility] and we will try to obtain it.  She has also had incisional hernia repair.  She has a history of osteopenia, and she has a  history of multiple anti-red cell antibodies making a transfusion difficult.  The patient tells me that she has a history of low calcium and a tendency to get liver injections.  PAST SURGICAL HISTORY: Past Surgical History  Procedure Laterality Date  . Liver surgery    . Breast surgery    . Laparotomies    . Hernia repair      incisional hernia repair x3    FAMILY HISTORY Family History  Problem Relation Age of Onset  . Ovarian cancer Mother   . Breast cancer Maternal Grandmother   . Stomach cancer Maternal Grandfather   The patient's mother died from ovarian cancer at the age of 38.  The patient's father is alive at age 98.  She has 2 brothers, no sisters.  A maternal grandmother was diagnosed with breast cancer at the age of 7, and the maternal grandfather had stomach cancer at the age of 50.  GYNECOLOGIC HISTORY: The patient had menarche at age 39.  Her last menstrual period was in 2005.  She never used hormone replacement.  She used birth control briefly to control bleeding prior to 1980.  She was 59 years old at the birth of her first child.  She started having mammograms at age 62 because of a strong family history.  SOCIAL HISTORY: Crystal [name] works as a Programmer, applications for [employer].   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  Son, Crystal [name], lives in [place] and works for that [employer]  Daughter, Crystal [name], lives in [place] and is a homemaker.   ADVANCED DIRECTIVES: in place  HEALTH MAINTENANCE: Social History  Substance Use Topics  . Smoking status: Never Smoker   . Smokeless tobacco: Not on file  . Alcohol Use: 1.2 oz/week    2 Glasses of wine per week     Colonoscopy:  PAP:  Bone density:  Lipid panel:  Allergies  Allergen Reactions  . Promethazine Hcl Other (See Comments)    Causes shaking  . Penicillins Rash    Current Outpatient  Prescriptions  Medication Sig Dispense Refill  . b complex vitamins tablet Take 1 tablet by mouth daily.      . calcium-vitamin D (OSCAL WITH D) 500-200 MG-UNIT per tablet Take 2 tablets by mouth daily.    . famotidine (PEPCID AC) 10 MG chewable tablet Chew 10 mg by mouth daily as needed for heartburn.    . fish oil-omega-3 fatty acids 1000 MG capsule Take 2 g by mouth daily.      Marland Kitchen glucosamine-chondroitin 500-400 MG tablet Take 1 tablet by mouth 3 (three) times daily.      Marland Kitchen ibuprofen (ADVIL,MOTRIN) 100 MG chewable tablet Chew 100 mg by mouth every 8 (eight) hours as needed.      . Multiple Vitamins-Minerals (MULTIVITAMIN WITH MINERALS) tablet Take 1 tablet by mouth daily.      Marland Kitchen Specialty Vitamins Products (MAGNESIUM, AMINO ACID CHELATE,) 133 MG tablet Take 1 tablet by mouth 2 (two) times daily.       No current facility-administered medications for this visit.    OBJECTIVE: Middle-aged white woman  Who appears well  Filed Vitals:   06/05/15 1618  BP: 123/65  Pulse: 78  Temp: 98.5 F (36.9 [name])  Resp: 18     Body mass index is 22.25 kg/(m^2).    ECOG FS: 0  Sclerae unicteric, EOMs intact Oropharynx clear, dentition in good repair No cervical or supraclavicular adenopathy Lungs no rales or rhonchi Heart regular rate and rhythm Abd soft, nontender, positive bowel sounds MSK no focal spinal tenderness, no upper extremity lymphedema Neuro: nonfocal, well oriented, appropriate affect Breasts:  The right breast is status post prior lumpectomy and radiation. I do not palpate a mass in the upper outer quadrant or elsewhere in the breast. Of course there is scar tissue present, which complicates exam. The right axilla is benign per the left breast is unremarkable.   LAB RESULTS: Lab Results  Component Value Date   WBC 7.1 06/05/2015   NEUTROABS 5.0 06/05/2015   HGB 13.6 06/05/2015   HCT 41.3 06/05/2015   MCV 88.3 06/05/2015   PLT 231 06/05/2015      Chemistry      Component  Value Date/Time   NA 141 06/05/2015 1559   NA 139 03/10/2013 0848   K 4.6 06/05/2015 1559   K 3.9 03/10/2013 0848   CL 102 03/15/2013 0912   CL 100 03/10/2013 0848   CO2 23 06/05/2015 1559   CO2 23 03/10/2013 0848   BUN 16.9 06/05/2015 1559   BUN 20 03/10/2013 0848   CREATININE 0.9 06/05/2015 1559   CREATININE 0.69 03/10/2013 0848      Component Value Date/Time   CALCIUM 9.8 06/05/2015 1559   CALCIUM 10.0 03/10/2013 0848   ALKPHOS 119 06/05/2015 1559   ALKPHOS 131* 03/10/2013 0848   AST 25 06/05/2015 1559   AST 29 03/10/2013 0848   ALT 31 06/05/2015 1559   ALT 36* 03/10/2013 0848   BILITOT 0.26 06/05/2015 1559   BILITOT 0.3 03/10/2013 0848       Lab Results  Component Value Date   LABCA2 28 10/20/2012    No components found for: [id number]  No results for input(s): INR in the last 168 hours.  Urinalysis No results found for: COLORURINE  STUDIES:  outside films reviewed  ASSESSMENT: 59 y.o.  BRCA 1-2 and p-53 negative Crystal [name] woman status post right lumpectomy and sentinel lymph node sampling June 2012 for a T1a N0, Stage IA  invasive ductal carcinoma, grade 3, estrogen receptor 98% positive, progesterone receptor 97% positive, with an MIB-1 of 5% and no HER-2 amplification and progesterone receptor positive, HER2/neu negative.   (a) note that the initial Right breast biopsy showed DCIS w papillary architecture, estrogen receptor 70% positive, progesterone receptor negative  (1) completed radiation September 2012.   (2) on letrozole since July 2012   RECURRENT DISEASE: (3) right breast upper outer quadrant biopsy 05/23/2015 was positive for a clinical T1c N0, stage IA invasive ductal carcinoma, grade 2, estrogen receptor 60% positive with moderate staining intensity, progesterone receptor negative, with no HER-2 ossification and an MIB-1 of 90%.  PLAN:   we discussed her situation for a little bit over now her 2 clarify the multiple decisions had of her.  She  understands mastectomy is unavoidable. She met with Dr. Iran Planas and considered reconstruction. She was very appreciative of the information she received. After much thought she has made a firm decision that she will not want reconstruction. This does facilitate finding the right surgical date for her.   That date is particularly important  because the patient's daughter is scheduled for [name]-section September 2. She is actually expected to deliver before that. The patient would like to spend a couple of weeks helping her because her daughter has 2 children aged 50 and under a ready in the house and her daughter's husband is going to be leaving the country on admission trip. Accordingly a date  In the second week in September (around September 13 or so) would be optimal for her.  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 They have raised more than $150,000 already this year.  She would like to be able to fully participate in that.   going back to her cancer, I think much we are seeing is not a recurrent of her prior invasive cancer, but a transformation of her prior noninvasive in situ breast cancer, which was like this tumor estrogen receptor moderately positive but progesterone receptor negative. What we are dealing with now is a very aggressive cancer that will require chemotherapy particularly since it was able to grow right through the letrozole. Accordingly I proposed to Crystal [name] that she receive 4 cycles of Cytoxan and Taxotere. We discussed the possible toxicities, side effects and complications of those agents. The tentative starting date for chemotherapy is October 11.   before the surgery I am setting her up for staging scans with a chest CT and bone scan since the risk of disseminated disease is high in these situations.  She will also need "chemotherapy school", and I have also made her an appointment with  my 28 assistant for October 4 to review the supportive medications for the chemotherapy she will receive the following week-- I reviewed and adapted the premeds to facilitate that.   Lilliann has a good understanding of this plan. She is very motivated to achieve it.  I hoping for good long-term outcome for her. She knows to call for any problems that may develop before her next visit here. Crystal [name]    06/05/2015

## 2015-06-06 ENCOUNTER — Telehealth: Payer: Self-pay | Admitting: Oncology

## 2015-06-06 ENCOUNTER — Other Ambulatory Visit: Payer: Self-pay | Admitting: Surgery

## 2015-06-06 DIAGNOSIS — C50911 Malignant neoplasm of unspecified site of right female breast: Secondary | ICD-10-CM

## 2015-06-06 NOTE — Telephone Encounter (Signed)
Mailed calendar. °

## 2015-06-12 ENCOUNTER — Ambulatory Visit
Admission: RE | Admit: 2015-06-12 | Discharge: 2015-06-12 | Disposition: A | Discharge status: Home / Self Care | Payer: BLUE CROSS/BLUE SHIELD | Source: Ambulatory Visit | Attending: Surgery | Admitting: Surgery

## 2015-06-12 MED ORDER — GADOBENATE DIMEGLUMINE 529 MG/ML IV SOLN
12.0000 mL | Freq: Once | INTRAVENOUS | Status: AC | PRN
  Administered 2015-06-12: 12 mL via INTRAVENOUS

## 2015-06-13 ENCOUNTER — Encounter (HOSPITAL_COMMUNITY): Payer: BLUE CROSS/BLUE SHIELD

## 2015-06-13 ENCOUNTER — Other Ambulatory Visit: Payer: Self-pay | Admitting: Surgery

## 2015-06-13 ENCOUNTER — Encounter (HOSPITAL_COMMUNITY)
Admission: RE | Admit: 2015-06-13 | Discharge: 2015-06-13 | Disposition: A | Discharge status: Home / Self Care | Payer: BLUE CROSS/BLUE SHIELD | Source: Ambulatory Visit | Attending: Oncology | Admitting: Oncology

## 2015-06-13 ENCOUNTER — Ambulatory Visit (HOSPITAL_COMMUNITY)
Admission: RE | Admit: 2015-06-13 | Discharge: 2015-06-13 | Disposition: A | Discharge status: Home / Self Care | Payer: BLUE CROSS/BLUE SHIELD | Source: Ambulatory Visit | Attending: Oncology | Admitting: Oncology

## 2015-06-13 ENCOUNTER — Encounter (HOSPITAL_COMMUNITY): Payer: Self-pay

## 2015-06-13 DIAGNOSIS — C50911 Malignant neoplasm of unspecified site of right female breast: Secondary | ICD-10-CM

## 2015-06-13 DIAGNOSIS — R911 Solitary pulmonary nodule: Secondary | ICD-10-CM | POA: Diagnosis not present

## 2015-06-13 MED ORDER — IOHEXOL 300 MG/ML  SOLN
75.0000 mL | Freq: Once | INTRAMUSCULAR | Status: AC | PRN
  Administered 2015-06-13: 75 mL via INTRAVENOUS

## 2015-06-13 MED ORDER — TECHNETIUM TC 99M MEDRONATE IV KIT
25.0000 | PACK | Freq: Once | INTRAVENOUS | Status: AC | PRN
  Administered 2015-06-13: 25 via INTRAVENOUS

## 2015-06-14 ENCOUNTER — Other Ambulatory Visit: Payer: Self-pay | Admitting: Oncology

## 2015-06-21 ENCOUNTER — Other Ambulatory Visit: Payer: Self-pay | Admitting: *Deleted

## 2015-06-21 NOTE — Progress Notes (Signed)
This RN spoke with pt per result of CT chest and bone scan.  Discussed need for biopsy of area in lung noted on CT.  Crystal [name] is currently in [place] with daughter who is post partem and pt is assisting in care.  Crystal [name] will return on 9/16 and request for biopsy to be performed the following week.  Presently Crystal [name] is scheduled for breast surgery 9/21.  MD will be notified for appropriate order for biopsy to be scheduled.

## 2015-06-26 ENCOUNTER — Other Ambulatory Visit: Payer: Self-pay | Admitting: *Deleted

## 2015-06-26 DIAGNOSIS — C50911 Malignant neoplasm of unspecified site of right female breast: Secondary | ICD-10-CM

## 2015-06-26 DIAGNOSIS — R911 Solitary pulmonary nodule: Secondary | ICD-10-CM

## 2015-06-27 ENCOUNTER — Telehealth: Payer: Self-pay | Admitting: *Deleted

## 2015-06-27 NOTE — Telephone Encounter (Signed)
This RN was contacted by Dr Pascal Lux with IR per request for CT guided biopsy of lung nodule.  Per his review noted area as " very small " - " may be difficult to obtain the tissue we would need ". " it could be inflammatory "  Dr Pascal Lux could not locate a prior CT of chest for comparison.  Recommendation per above is to either obtain a PET scan or re-evaluate with a CT in the near future.  Dr Pascal Lux gave his contact number as [phone] for MD contact if Dr Jana Hakim wants to proceed with the biopsy.  Above reviewed with Dr Jana Hakim.  Plan at present is for pt to proceed with current plan for known recurrent breast cancer. At next office visit follow up with CT of if warranted a PET for lung nodule concern.  This RN called pt and discussed the above. Hasel verbalized understanding and stated agreement to plan.  She understands to call if further concerns.

## 2015-06-28 ENCOUNTER — Encounter (HOSPITAL_BASED_OUTPATIENT_CLINIC_OR_DEPARTMENT_OTHER): Payer: Self-pay | Admitting: *Deleted

## 2015-07-04 ENCOUNTER — Other Ambulatory Visit (HOSPITAL_BASED_OUTPATIENT_CLINIC_OR_DEPARTMENT_OTHER): Payer: BLUE CROSS/BLUE SHIELD

## 2015-07-04 DIAGNOSIS — C50411 Malignant neoplasm of upper-outer quadrant of right female breast: Secondary | ICD-10-CM

## 2015-07-04 DIAGNOSIS — C50919 Malignant neoplasm of unspecified site of unspecified female breast: Secondary | ICD-10-CM

## 2015-07-04 LAB — CBC WITH DIFFERENTIAL/PLATELET
BASO%: 0.4 % (ref 0.0–2.0)
Basophils Absolute: 0 10*3/uL (ref 0.0–0.1)
EOS%: 0.9 % (ref 0.0–7.0)
Eosinophils Absolute: 0.1 10*3/uL (ref 0.0–0.5)
HEMATOCRIT: 42.1 % (ref 34.8–46.6)
HGB: 13.7 g/dL (ref 11.6–15.9)
LYMPH%: 26.8 % (ref 14.0–49.7)
MCH: 28.8 pg (ref 25.1–34.0)
MCHC: 32.7 g/dL (ref 31.5–36.0)
MCV: 88.1 fL (ref 79.5–101.0)
MONO#: 0.3 10*3/uL (ref 0.1–0.9)
MONO%: 5.5 % (ref 0.0–14.0)
NEUT#: 3.9 10*3/uL (ref 1.5–6.5)
NEUT%: 66.4 % (ref 38.4–76.8)
PLATELETS: 213 10*3/uL (ref 145–400)
RBC: 4.77 10*6/uL (ref 3.70–5.45)
RDW: 13.3 % (ref 11.2–14.5)
WBC: 6 10*3/uL (ref 3.9–10.3)
lymph#: 1.6 10*3/uL (ref 0.9–3.3)

## 2015-07-04 LAB — COMPREHENSIVE METABOLIC PANEL (CC13)
ALBUMIN: 4.1 g/dL (ref 3.5–5.0)
ALK PHOS: 118 U/L (ref 40–150)
ALT: 30 U/L (ref 0–55)
ANION GAP: 9 meq/L (ref 3–11)
AST: 22 U/L (ref 5–34)
BILIRUBIN TOTAL: 0.32 mg/dL (ref 0.20–1.20)
BUN: 16.9 mg/dL (ref 7.0–26.0)
CALCIUM: 9.6 mg/dL (ref 8.4–10.4)
CHLORIDE: 103 meq/L (ref 98–109)
CO2: 27 mEq/L (ref 22–29)
CREATININE: 0.8 mg/dL (ref 0.6–1.1)
EGFR: 80 mL/min/{1.73_m2} — ABNORMAL LOW (ref >90)
Glucose: 169 mg/dl — ABNORMAL HIGH (ref 70–140)
Potassium: 4.5 mEq/L (ref 3.5–5.1)
Sodium: 139 mEq/L (ref 136–145)
TOTAL PROTEIN: 7.3 g/dL (ref 6.4–8.3)

## 2015-07-04 NOTE — H&P (Signed)
Crystal [name]  Location: [facility] Patient #: [id number] DOB: 1956/02/26 Married / Language: English / Race: White Female  History of Present Illness  Patient words: new R breast cancer.  The patient is a 59 year old female who presents with breast cancer. This patient is referred here for recurrent right breast cancer. She had actually undergone a right partial mastectomy and sentinel lymph node biopsy by Dr. Dalbert [name] in June 2012. She is currently followed by Dr. Jana [name] at the cancer center and is on anti-hormonal therapy. She was just found to have a greater than 1 cm mass on her yearly mammogram. This was in the right breast. It has been biopsied and shows an invasive ductal carcinoma which is 60% ER and 0% PR negative. Ki-67 is 90%. She denies any nipple discharge    Breast Cancer Chest pain General anesthesia - complications Hemorrhoids Lump In Breast Transfusion history Ventral Hernia Repair   Breast Biopsy Right. multiple Gallbladder Surgery - Open Liver Surgery Resection of Small Bowel Sentinel Lymph Node Biopsy Ventral / Umbilical Hernia Surgery multiple  Diagnostic Studies History (Crystal [name];  Colonoscopy 1-5 years ago Mammogram within [name] year Pap Smear 1-5 years ago  Allergies Crystal [name];  Penicillin G Sodium *PENICILLINS*  Medication History (Crystal [name];  Letrozole (2.5MG Tablet, Oral) Active. B Complex (Oral) Active. Calcium-Vitamin D (500-200MG-UNIT Tablet, Oral) Active. Fish Oil (1000MG Capsule DR, Oral) Active. Glucosamine Chondroitin Complx (Oral) Active. Multivitamin Adult (Oral) Active. Medications Reconciled  Social History Crystal [name];  Alcohol use Occasional alcohol use. Caffeine use Tea. No drug use Tobacco use Never smoker.  Family History (Crystal [name];  Colon Polyps Father. Heart Disease Father. Heart disease in female family member before age 60 Ischemic Bowel  Disease Son. Ovarian Cancer Mother. Prostate Cancer Father.  Pregnancy / Birth History Crystal [name] Age at menarche 60 years. Age of menopause 84-50 Gravida 3 Irregular periods Maternal age 40-25 Para 2  Review of Systems (Crystal [name]; General Present- Fatigue. Not Present- Appetite Loss, Chills, Fever, Night Sweats, Weight Gain and Weight Loss. Skin Not Present- Change in Wart/Mole, Dryness, Hives, Jaundice, New Lesions, Non-Healing Wounds, Rash and Ulcer. HEENT Present- Wears glasses/contact lenses. Not Present- Earache, Hearing Loss, Hoarseness, Nose Bleed, Oral Ulcers, Ringing in the Ears, Seasonal Allergies, Sinus Pain, Sore Throat, Visual Disturbances and Yellow Eyes. Respiratory Not Present- Bloody sputum, Chronic Cough, Difficulty Breathing, Snoring and Wheezing. Breast Present- Breast Mass. Not Present- Breast Pain, Nipple Discharge and Skin Changes. Gastrointestinal Not Present- Abdominal Pain, Bloating, Bloody Stool, Change in Bowel Habits, Chronic diarrhea, Constipation, Difficulty Swallowing, Excessive gas, Gets full quickly at meals, Hemorrhoids, Indigestion, Nausea, Rectal Pain and Vomiting.   Vitals Crystal [name]; 05/29/2015 9:42 AM)  Weight: 136.6 lb Height: 64in Body Surface Area: 1.67 m Body Mass Index: 23.45 kg/m Temp.: 98.46F(Oral)  Pulse: 64 (Regular)  BP: 116/68 (Sitting, Left Arm, Standard)    Physical Exam (Crystal Marron A. Ninfa Linden MD The physical exam findings are as follows: Note:She is well in appearance. Lungs clear CV RRR Her right breast shows a 1 cm mass at the 12, position inferior to her previous scar. There is no axillary adenopathy. There is mild ecchymosis from the biopsy. The mass is firm and not well marginated    Assessment & Plan (Crystal Estis A. Ninfa Linden MD;  RECURRENT BREAST CANCER, RIGHT   She is seeing medical oncology and plastic surgery.  She will also have a mastectomy on the right side with sentinel node  biopsy and wants to forego  reconstruction.  A Port-A-Cath will also be placed for chemotherapy.  I discussed the risk of surgery which includes but is not limited to bleeding, infection, injury to surrounding structures, pneumothorax, port malfunction, need for further surgery, etc.  She understands and wishes to proceed.

## 2015-07-05 ENCOUNTER — Ambulatory Visit (HOSPITAL_BASED_OUTPATIENT_CLINIC_OR_DEPARTMENT_OTHER): Payer: BLUE CROSS/BLUE SHIELD | Admitting: Anesthesiology

## 2015-07-05 ENCOUNTER — Ambulatory Visit (HOSPITAL_BASED_OUTPATIENT_CLINIC_OR_DEPARTMENT_OTHER)
Admission: RE | Admit: 2015-07-05 | Discharge: 2015-07-06 | Disposition: A | Discharge status: Home / Self Care | Payer: BLUE CROSS/BLUE SHIELD | Source: Ambulatory Visit | Attending: Surgery | Admitting: Surgery

## 2015-07-05 ENCOUNTER — Ambulatory Visit (HOSPITAL_COMMUNITY)
Admission: RE | Admit: 2015-07-05 | Discharge: 2015-07-05 | Disposition: A | Discharge status: Home / Self Care | Payer: BLUE CROSS/BLUE SHIELD | Source: Ambulatory Visit | Attending: Surgery | Admitting: Surgery

## 2015-07-05 ENCOUNTER — Encounter (HOSPITAL_BASED_OUTPATIENT_CLINIC_OR_DEPARTMENT_OTHER): Payer: Self-pay | Admitting: *Deleted

## 2015-07-05 ENCOUNTER — Ambulatory Visit (HOSPITAL_COMMUNITY): Payer: BLUE CROSS/BLUE SHIELD

## 2015-07-05 ENCOUNTER — Encounter (HOSPITAL_BASED_OUTPATIENT_CLINIC_OR_DEPARTMENT_OTHER)
Admission: RE | Disposition: A | Discharge status: Home / Self Care | Payer: Self-pay | Source: Ambulatory Visit | Attending: Surgery

## 2015-07-05 DIAGNOSIS — D0511 Intraductal carcinoma in situ of right breast: Secondary | ICD-10-CM | POA: Insufficient documentation

## 2015-07-05 DIAGNOSIS — C50919 Malignant neoplasm of unspecified site of unspecified female breast: Secondary | ICD-10-CM | POA: Diagnosis present

## 2015-07-05 DIAGNOSIS — C50911 Malignant neoplasm of unspecified site of right female breast: Secondary | ICD-10-CM | POA: Diagnosis present

## 2015-07-05 DIAGNOSIS — Z419 Encounter for procedure for purposes other than remedying health state, unspecified: Secondary | ICD-10-CM

## 2015-07-05 DIAGNOSIS — Z95828 Presence of other vascular implants and grafts: Secondary | ICD-10-CM

## 2015-07-05 HISTORY — PX: PORTACATH PLACEMENT: SHX2246

## 2015-07-05 HISTORY — DX: Other complications of anesthesia, initial encounter: T88.59XA

## 2015-07-05 HISTORY — PX: MASTECTOMY W/ SENTINEL NODE BIOPSY: SHX2001

## 2015-07-05 HISTORY — DX: Gastro-esophageal reflux disease without esophagitis: K21.9

## 2015-07-05 HISTORY — DX: Adverse effect of unspecified anesthetic, initial encounter: T41.45XA

## 2015-07-05 SURGERY — MASTECTOMY WITH SENTINEL LYMPH NODE BIOPSY
Anesthesia: Regional | Site: Chest | Laterality: Right

## 2015-07-05 MED ORDER — MIDAZOLAM HCL 2 MG/2ML IJ SOLN
1.0000 mg | INTRAMUSCULAR | Status: DC | PRN
  Administered 2015-07-05: 2 mg via INTRAVENOUS

## 2015-07-05 MED ORDER — HYDROMORPHONE HCL 1 MG/ML IJ SOLN: 0.2500 mg | INTRAMUSCULAR | Status: DC | PRN

## 2015-07-05 MED ORDER — LIDOCAINE HCL (PF) 1 % IJ SOLN
INTRAMUSCULAR | Status: AC
  Filled 2015-07-05: qty 30

## 2015-07-05 MED ORDER — IBUPROFEN 600 MG PO TABS
600.0000 mg | ORAL_TABLET | Freq: Four times a day (QID) | ORAL | Status: DC | PRN
  Administered 2015-07-05 – 2015-07-06 (×3): 600 mg via ORAL
  Filled 2015-07-05 (×3): qty 1

## 2015-07-05 MED ORDER — HEPARIN SOD (PORK) LOCK FLUSH 100 UNIT/ML IV SOLN
INTRAVENOUS | Status: DC | PRN
  Administered 2015-07-05: 500 [IU] via INTRAVENOUS

## 2015-07-05 MED ORDER — METHYLENE BLUE 1 % INJ SOLN
INTRAMUSCULAR | Status: DC | PRN
  Administered 2015-07-05: 3 mL via SUBMUCOSAL

## 2015-07-05 MED ORDER — FENTANYL CITRATE (PF) 100 MCG/2ML IJ SOLN
INTRAMUSCULAR | Status: AC
  Filled 2015-07-05: qty 4

## 2015-07-05 MED ORDER — FENTANYL CITRATE (PF) 100 MCG/2ML IJ SOLN
INTRAMUSCULAR | Status: AC
  Filled 2015-07-05: qty 2

## 2015-07-05 MED ORDER — MEPERIDINE HCL 25 MG/ML IJ SOLN: 6.2500 mg | INTRAMUSCULAR | Status: DC | PRN

## 2015-07-05 MED ORDER — OXYCODONE HCL 5 MG PO TABS
5.0000 mg | ORAL_TABLET | ORAL | Status: DC | PRN
  Administered 2015-07-05: 10 mg via ORAL
  Administered 2015-07-05 – 2015-07-06 (×4): 5 mg via ORAL
  Filled 2015-07-05: qty 1
  Filled 2015-07-05: qty 2
  Filled 2015-07-05: qty 1

## 2015-07-05 MED ORDER — SCOPOLAMINE 1 MG/3DAYS TD PT72
MEDICATED_PATCH | TRANSDERMAL | Status: DC | PRN
  Administered 2015-07-05: 1 via TRANSDERMAL

## 2015-07-05 MED ORDER — SODIUM CHLORIDE 0.9 % IJ SOLN
INTRAMUSCULAR | Status: DC | PRN
  Administered 2015-07-05: 3 mL via INTRAVENOUS

## 2015-07-05 MED ORDER — ONDANSETRON 4 MG PO TBDP: 4.0000 mg | ORAL_TABLET | Freq: Four times a day (QID) | ORAL | Status: DC | PRN

## 2015-07-05 MED ORDER — LIDOCAINE HCL (CARDIAC) 20 MG/ML IV SOLN
INTRAVENOUS | Status: DC | PRN
  Administered 2015-07-05: 50 mg via INTRAVENOUS

## 2015-07-05 MED ORDER — PROPOFOL 10 MG/ML IV BOLUS
INTRAVENOUS | Status: DC | PRN
  Administered 2015-07-05: 200 mg via INTRAVENOUS
  Administered 2015-07-05 (×2): 30 mg via INTRAVENOUS

## 2015-07-05 MED ORDER — MIDAZOLAM HCL 5 MG/5ML IJ SOLN
INTRAMUSCULAR | Status: DC | PRN
  Administered 2015-07-05: 2 mg via INTRAVENOUS

## 2015-07-05 MED ORDER — METHOCARBAMOL 500 MG PO TABS: 500.0000 mg | ORAL_TABLET | Freq: Four times a day (QID) | ORAL | Status: DC | PRN

## 2015-07-05 MED ORDER — FENTANYL CITRATE (PF) 100 MCG/2ML IJ SOLN
50.0000 ug | INTRAMUSCULAR | Status: DC | PRN
  Administered 2015-07-05: 100 ug via INTRAVENOUS

## 2015-07-05 MED ORDER — DEXAMETHASONE SODIUM PHOSPHATE 10 MG/ML IJ SOLN
INTRAMUSCULAR | Status: AC
  Filled 2015-07-05: qty 1

## 2015-07-05 MED ORDER — MIDAZOLAM HCL 2 MG/2ML IJ SOLN
INTRAMUSCULAR | Status: AC
  Filled 2015-07-05: qty 4

## 2015-07-05 MED ORDER — POTASSIUM CHLORIDE IN NACL 20-0.9 MEQ/L-% IV SOLN
INTRAVENOUS | Status: DC
  Administered 2015-07-05: 12:00:00 via INTRAVENOUS
  Filled 2015-07-05: qty 1000

## 2015-07-05 MED ORDER — BUPIVACAINE-EPINEPHRINE (PF) 0.5% -1:200000 IJ SOLN
INTRAMUSCULAR | Status: DC | PRN
  Administered 2015-07-05: 25 mL via PERINEURAL

## 2015-07-05 MED ORDER — GLYCOPYRROLATE 0.2 MG/ML IJ SOLN: 0.2000 mg | Freq: Once | INTRAMUSCULAR | Status: DC | PRN

## 2015-07-05 MED ORDER — FENTANYL CITRATE (PF) 100 MCG/2ML IJ SOLN
INTRAMUSCULAR | Status: DC | PRN
  Administered 2015-07-05 (×2): 50 ug via INTRAVENOUS
  Administered 2015-07-05: 100 ug via INTRAVENOUS
  Administered 2015-07-05: 50 ug via INTRAVENOUS

## 2015-07-05 MED ORDER — CIPROFLOXACIN IN D5W 400 MG/200ML IV SOLN
INTRAVENOUS | Status: AC
  Filled 2015-07-05: qty 200

## 2015-07-05 MED ORDER — HEPARIN (PORCINE) IN NACL 2-0.9 UNIT/ML-% IJ SOLN
INTRAMUSCULAR | Status: AC
  Filled 2015-07-05: qty 500

## 2015-07-05 MED ORDER — CIPROFLOXACIN IN D5W 400 MG/200ML IV SOLN
400.0000 mg | INTRAVENOUS | Status: AC
  Administered 2015-07-05 (×2): 400 mg via INTRAVENOUS

## 2015-07-05 MED ORDER — ONDANSETRON HCL 4 MG/2ML IJ SOLN
INTRAMUSCULAR | Status: DC | PRN
  Administered 2015-07-05: 4 mg via INTRAVENOUS

## 2015-07-05 MED ORDER — HEPARIN SOD (PORK) LOCK FLUSH 100 UNIT/ML IV SOLN
INTRAVENOUS | Status: AC
  Filled 2015-07-05: qty 5

## 2015-07-05 MED ORDER — HYDROMORPHONE HCL 1 MG/ML IJ SOLN: 0.5000 mg | INTRAMUSCULAR | Status: DC | PRN

## 2015-07-05 MED ORDER — SCOPOLAMINE 1 MG/3DAYS TD PT72
MEDICATED_PATCH | TRANSDERMAL | Status: AC
  Filled 2015-07-05: qty 1

## 2015-07-05 MED ORDER — OXYCODONE HCL 5 MG/5ML PO SOLN: 5.0000 mg | Freq: Once | ORAL | Status: DC | PRN

## 2015-07-05 MED ORDER — PHENYLEPHRINE HCL 10 MG/ML IJ SOLN
INTRAMUSCULAR | Status: DC | PRN
  Administered 2015-07-05 (×4): 40 ug via INTRAVENOUS

## 2015-07-05 MED ORDER — PROPOFOL 10 MG/ML IV BOLUS
INTRAVENOUS | Status: AC
  Filled 2015-07-05: qty 20

## 2015-07-05 MED ORDER — LIDOCAINE HCL (CARDIAC) 20 MG/ML IV SOLN
INTRAVENOUS | Status: AC
  Filled 2015-07-05: qty 5

## 2015-07-05 MED ORDER — ONDANSETRON HCL 4 MG/2ML IJ SOLN
INTRAMUSCULAR | Status: AC
  Filled 2015-07-05: qty 2

## 2015-07-05 MED ORDER — OXYCODONE HCL 5 MG PO TABS
5.0000 mg | ORAL_TABLET | Freq: Once | ORAL | Status: DC | PRN
  Filled 2015-07-05 (×2): qty 1

## 2015-07-05 MED ORDER — DEXAMETHASONE SODIUM PHOSPHATE 4 MG/ML IJ SOLN
INTRAMUSCULAR | Status: DC | PRN
  Administered 2015-07-05: 10 mg via INTRAVENOUS

## 2015-07-05 MED ORDER — ENOXAPARIN SODIUM 40 MG/0.4ML ~~LOC~~ SOLN: 40.0000 mg | SUBCUTANEOUS | Status: DC

## 2015-07-05 MED ORDER — ONDANSETRON HCL 4 MG/2ML IJ SOLN: 4.0000 mg | Freq: Four times a day (QID) | INTRAMUSCULAR | Status: DC | PRN

## 2015-07-05 MED ORDER — METHYLENE BLUE 1 % INJ SOLN
INTRAMUSCULAR | Status: AC
  Filled 2015-07-05: qty 10

## 2015-07-05 MED ORDER — SCOPOLAMINE 1 MG/3DAYS TD PT72: 1.0000 | MEDICATED_PATCH | Freq: Once | TRANSDERMAL | Status: DC | PRN

## 2015-07-05 MED ORDER — TECHNETIUM TC 99M SULFUR COLLOID FILTERED
1.0000 | Freq: Once | INTRAVENOUS | Status: AC | PRN
  Administered 2015-07-05: 1 via INTRADERMAL

## 2015-07-05 MED ORDER — BUPIVACAINE-EPINEPHRINE 0.5% -1:200000 IJ SOLN
INTRAMUSCULAR | Status: DC | PRN
  Administered 2015-07-05: 10 mL

## 2015-07-05 MED ORDER — BUPIVACAINE-EPINEPHRINE (PF) 0.5% -1:200000 IJ SOLN
INTRAMUSCULAR | Status: AC
  Filled 2015-07-05: qty 30

## 2015-07-05 MED ORDER — HEPARIN (PORCINE) IN NACL 2-0.9 UNIT/ML-% IJ SOLN
INTRAMUSCULAR | Status: DC | PRN
Start: 2015-07-05 — End: 2015-07-05
  Administered 2015-07-05: 5 mL via INTRAVENOUS

## 2015-07-05 MED ORDER — MIDAZOLAM HCL 2 MG/2ML IJ SOLN
INTRAMUSCULAR | Status: AC
  Filled 2015-07-05: qty 2

## 2015-07-05 MED ORDER — LACTATED RINGERS IV SOLN
INTRAVENOUS | Status: DC
  Administered 2015-07-05 (×2): via INTRAVENOUS

## 2015-07-05 MED ORDER — SODIUM CHLORIDE 0.9 % IJ SOLN
INTRAMUSCULAR | Status: AC
  Filled 2015-07-05: qty 10

## 2015-07-05 MED ORDER — MIDAZOLAM HCL 5 MG/5ML IJ SOLN: INTRAMUSCULAR | Status: DC | PRN

## 2015-07-05 MED ORDER — FAMOTIDINE 10 MG PO CHEW: 10.0000 mg | CHEWABLE_TABLET | Freq: Every day | ORAL | Status: DC | PRN

## 2015-07-05 SURGICAL SUPPLY — 53 items
APPLIER CLIP 9.375 MED OPEN (MISCELLANEOUS)
BAG DECANTER FOR FLEXI CONT (MISCELLANEOUS) ×3 IMPLANT
BLADE CLIPPER SURG (BLADE) IMPLANT
BLADE HEX COATED 2.75 (ELECTRODE) ×3 IMPLANT
BLADE SURG 15 STRL LF DISP TIS (BLADE) ×4 IMPLANT
BLADE SURG 15 STRL SS (BLADE) ×2
CANISTER SUCT 1200ML W/VALVE (MISCELLANEOUS) ×3 IMPLANT
CHLORAPREP W/TINT 26ML (MISCELLANEOUS) ×6 IMPLANT
CLIP APPLIE 9.375 MED OPEN (MISCELLANEOUS) IMPLANT
COVER BACK TABLE 60X90IN (DRAPES) ×3 IMPLANT
COVER MAYO STAND STRL (DRAPES) ×3 IMPLANT
COVER PROBE W GEL 5X96 (DRAPES) ×3 IMPLANT
DECANTER SPIKE VIAL GLASS SM (MISCELLANEOUS) IMPLANT
DRAIN CHANNEL 19F RND (DRAIN) ×3 IMPLANT
DRAPE C-ARM 42X72 X-RAY (DRAPES) ×3 IMPLANT
DRAPE LAPAROSCOPIC ABDOMINAL (DRAPES) ×6 IMPLANT
DRAPE UTILITY XL STRL (DRAPES) ×6 IMPLANT
ELECT REM PT RETURN 9FT ADLT (ELECTROSURGICAL) ×3
ELECTRODE REM PT RTRN 9FT ADLT (ELECTROSURGICAL) ×2 IMPLANT
EVACUATOR SILICONE 100CC (DRAIN) ×3 IMPLANT
GLOVE SURG SIGNA 7.5 PF LTX (GLOVE) ×6 IMPLANT
GLOVE SURG SS PI 7.0 STRL IVOR (GLOVE) ×6 IMPLANT
GOWN STRL REUS W/ TWL LRG LVL3 (GOWN DISPOSABLE) ×4 IMPLANT
GOWN STRL REUS W/ TWL XL LVL3 (GOWN DISPOSABLE) ×4 IMPLANT
GOWN STRL REUS W/TWL LRG LVL3 (GOWN DISPOSABLE) ×2
GOWN STRL REUS W/TWL XL LVL3 (GOWN DISPOSABLE) ×2
HEMOSTAT SURGICEL 2X14 (HEMOSTASIS) IMPLANT
IV KIT MINILOC 20X1 SAFETY (NEEDLE) IMPLANT
KIT PORT POWER 8FR ISP CVUE (Catheter) ×3 IMPLANT
LIQUID BAND (GAUZE/BANDAGES/DRESSINGS) ×6 IMPLANT
NDL SAFETY ECLIPSE 18X1.5 (NEEDLE) ×2 IMPLANT
NEEDLE HYPO 18GX1.5 SHARP (NEEDLE) ×1
NEEDLE HYPO 25X1 1.5 SAFETY (NEEDLE) ×6 IMPLANT
NS IRRIG 1000ML POUR BTL (IV SOLUTION) ×3 IMPLANT
PACK BASIN DAY SURGERY FS (CUSTOM PROCEDURE TRAY) ×3 IMPLANT
PENCIL BUTTON HOLSTER BLD 10FT (ELECTRODE) ×3 IMPLANT
PIN SAFETY STERILE (MISCELLANEOUS) ×3 IMPLANT
SLEEVE SCD COMPRESS KNEE MED (MISCELLANEOUS) ×3 IMPLANT
SPONGE GAUZE 4X4 12PLY STER LF (GAUZE/BANDAGES/DRESSINGS) IMPLANT
SPONGE LAP 18X18 X RAY DECT (DISPOSABLE) ×3 IMPLANT
SUT ETHILON 3 0 PS 1 (SUTURE) ×3 IMPLANT
SUT MNCRL AB 4-0 PS2 18 (SUTURE) ×3 IMPLANT
SUT PROLENE 2 0 SH DA (SUTURE) ×3 IMPLANT
SUT SILK 2 0 TIES 17X18 (SUTURE)
SUT SILK 2-0 18XBRD TIE BLK (SUTURE) IMPLANT
SUT VIC AB 3-0 SH 27 (SUTURE) ×3
SUT VIC AB 3-0 SH 27X BRD (SUTURE) ×6 IMPLANT
SYR BULB 3OZ (MISCELLANEOUS) ×3 IMPLANT
SYR CONTROL 10ML LL (SYRINGE) ×6 IMPLANT
TOWEL OR 17X24 6PK STRL BLUE (TOWEL DISPOSABLE) ×3 IMPLANT
TOWEL OR NON WOVEN STRL DISP B (DISPOSABLE) ×3 IMPLANT
TUBE CONNECTING 20X1/4 (TUBING) ×3 IMPLANT
YANKAUER SUCT BULB TIP NO VENT (SUCTIONS) ×3 IMPLANT

## 2015-07-05 NOTE — Interval H&P Note (Signed)
History and Physical Interval Note: no change in H and P  07/05/2015 7:56 AM  Crystal [name]  has presented today for surgery, with the diagnosis of Right Breast Cancer  The various methods of treatment have been discussed with the patient and family. After consideration of risks, benefits and other options for treatment, the patient has consented to  Procedure(s): RIGHT MASTECTOMY WITH SENTINEL NODE BIOPSY AND PORT A CATH INSERTION (Right) INSERTION PORT-A-CATH (Right) as a surgical intervention .  The patient's history has been reviewed, patient examined, no change in status, stable for surgery.  I have reviewed the patient's chart and labs.  Questions were answered to the patient's satisfaction.     Yuta Cipollone A

## 2015-07-05 NOTE — Op Note (Signed)
RIGHT MASTECTOMY WITH SENTINEL NODE BIOPSY AND PORT A CATH INSERTION, INSERTION PORT-A-CATH  Procedure Note  Crystal [name] 07/05/2015   Pre-op Diagnosis: Right Breast Cancer     Post-op Diagnosis: same  Procedure(s): RIGHT MASTECTOMY WITH SENTINEL NODE BIOPSY AND PORT A CATH INSERTION INSERTION PORT-A-CATH (left subclavian vein)  Surgeon(s): [name], MD  Anesthesia: General  Staff:  Circulator: Antionette Poles, RN Radiology Technologist: Ludger Nutting Relief Circulator: Beryle Flock Bouchillon, RN Scrub Person: Leroy Libman, RN  Estimated Blood Loss: Minimal               Specimens: sent to path          [facility]   Date: 07/05/2015  Time: 10:24 AM

## 2015-07-05 NOTE — Transfer of Care (Signed)
Immediate Anesthesia Transfer of Care Note  Patient: Crystal [name]  Procedure(s) Performed: Procedure(s): RIGHT MASTECTOMY WITH SENTINEL NODE BIOPSY AND PORT A CATH INSERTION (Right) INSERTION PORT-A-CATH (Left)  Patient Location: PACU  Anesthesia Type:GA combined with regional for post-op pain  Level of Consciousness: sedated  Airway & Oxygen Therapy: Patient Spontanous Breathing and Patient connected to face mask oxygen  Post-op Assessment: Report given to RN and Post -op Vital signs reviewed and stable  Post vital signs: Reviewed and stable  Last Vitals:  Filed Vitals:   07/05/15 1032  BP:   Pulse: 74  Temp:   Resp: 15    Complications: No apparent anesthesia complications

## 2015-07-05 NOTE — Anesthesia Preprocedure Evaluation (Signed)
Anesthesia Evaluation  Patient identified by MRN, date of birth, ID band Patient awake    Reviewed: Allergy & Precautions, NPO status , Patient's Chart, lab work & pertinent test results  Airway Mallampati: I  TM Distance: >3 FB Neck ROM: Full    Dental  (+) Teeth Intact, Dental Advisory Given   Pulmonary    breath sounds clear to auscultation       Cardiovascular  Rhythm:Regular Rate:Normal     Neuro/Psych    GI/Hepatic GERD  Medicated and Controlled,  Endo/Other    Renal/GU      Musculoskeletal   Abdominal   Peds  Hematology   Anesthesia Other Findings   Reproductive/Obstetrics                             Anesthesia Physical Anesthesia Plan  ASA: II  Anesthesia Plan: General and Regional   Post-op Pain Management:    Induction: Intravenous  Airway Management Planned: LMA  Additional Equipment:   Intra-op Plan:   Post-operative Plan: Extubation in OR  Informed Consent: I have reviewed the patients History and Physical, chart, labs and discussed the procedure including the risks, benefits and alternatives for the proposed anesthesia with the patient or authorized representative who has indicated his/her understanding and acceptance.   Dental advisory given  Plan Discussed with: CRNA, Anesthesiologist and Surgeon  Anesthesia Plan Comments:         Anesthesia Quick Evaluation

## 2015-07-05 NOTE — Anesthesia Postprocedure Evaluation (Signed)
  Anesthesia Post-op Note  Patient: Crystal [name]  Procedure(s) Performed: Procedure(s): RIGHT MASTECTOMY WITH SENTINEL NODE BIOPSY AND PORT A CATH INSERTION (Right) INSERTION PORT-A-CATH (Left)  Patient Location: PACU  Anesthesia Type: General, Regional   Level of Consciousness: awake, alert  and oriented  Airway and Oxygen Therapy: Patient Spontanous Breathing  Post-op Pain: mild  Post-op Assessment: Post-op Vital signs reviewed  Post-op Vital Signs: Reviewed  Last Vitals:  Filed Vitals:   07/05/15 1230  BP: 132/82  Pulse: 69  Temp: 36.1 C  Resp: 16    Complications: No apparent anesthesia complications

## 2015-07-05 NOTE — Anesthesia Procedure Notes (Addendum)
Anesthesia Regional Block:  Pectoralis block  Pre-Anesthetic Checklist: ,, timeout performed, Correct Patient, Correct Site, Correct Laterality, Correct Procedure, Correct Position, site marked, Risks and benefits discussed,  Surgical consent,  Pre-op evaluation,  At surgeon's request and post-op pain management  Laterality: Right and Upper  Prep: chloraprep       Needles:  Injection technique: Single-shot  Needle Type: Echogenic Needle     Needle Length: 9cm 9 cm Needle Gauge: 21 and 21 G    Additional Needles:  Procedures: ultrasound guided (picture in chart) Pectoralis block Narrative:  Start time: 07/05/2015 8:11 AM End time: 07/05/2015 8:16 AM Injection made incrementally with aspirations every 5 mL.  Performed by: Personally  Anesthesiologist: CREWS, DAVID   Procedure Name: LMA Insertion Date/Time: 07/05/2015 8:40 AM Performed by: Marrianne Mood Pre-anesthesia Checklist: Patient identified, Emergency Drugs available, Suction available, Patient being monitored and Timeout performed Patient Re-evaluated:Patient Re-evaluated prior to inductionOxygen Delivery Method: Circle System Utilized Preoxygenation: Pre-oxygenation with 100% oxygen Intubation Type: IV induction Ventilation: Mask ventilation without difficulty LMA: LMA inserted LMA Size: 4.0 Number of attempts: 1 Airway Equipment and Method: Bite block Placement Confirmation: positive ETCO2 Tube secured with: Tape Dental Injury: Teeth and Oropharynx as per pre-operative assessment

## 2015-07-05 NOTE — Progress Notes (Signed)
Emotional support during breast injections °

## 2015-07-05 NOTE — Op Note (Signed)
NAMELOREAN, EKSTRAND              ACCOUNT NO.:  [id number]  MEDICAL RECORD NO.:  [id number]  LOCATION:  [facility]                          FACILITY:  [facility]  PHYSICIAN:  [name], M.D. DATE OF BIRTH:  1956/01/14  DATE OF PROCEDURE:  07/05/2015 DATE OF DISCHARGE:                              OPERATIVE REPORT   PREOPERATIVE DIAGNOSIS:  Right breast cancer.  POSTOPERATIVE DIAGNOSIS:  Right breast cancer.  PROCEDURE: 1. Right total mastectomy. 2. Left subclavian vein Port-A-Cath insertion. 3. Injection of blue dye.  SURGEON:  [name], M.D.  ANESTHESIA:  General with pectoralis block by Anesthesia and Marcaine.  ESTIMATED BLOOD LOSS:  Minimal.  INDICATIONS:  Crystal [name] is a 59 year old female who has had recurrent cancer in the right breast.  She has had a previous lumpectomy with sentinel node biopsy and radiation.  Decision now has been made to proceed with a mastectomy and Port-A-Cath insertion.  Attempt will also be made to do a sentinel node biopsy.  FINDINGS:  Neither the radioactive isotope or the blue dye went into the axilla.  There were no large palpable lymph nodes in the axilla, and I did take a small amount of fat with the mastectomy specimen in the axilla which may contain some lymph nodes.  The malignancy itself appeared to be very close to the pectoralis muscle, so a margin of muscle was taken as well.  PROCEDURE IN DETAIL:  The patient was brought to the operating room, identified as Crystal [name].  She already had a pectoralis block performed and injection of radioactive isotope into the right breast. She was placed on the operating table where general anesthesia was induced.  I injected blue dye underneath the areola of the right breast and massaged the breast.  Her left chest was then prepped and draped in usual sterile fashion.  I anesthetized the skin with Marcaine and then used the introducer needle with the patient in the  Trendelenburg position to cannulate the left subclavian vein.  A wire was then passed through the introducer needle and into the central venous system under direct fluoroscopy.  I anesthetized the skin further and made an incision with a scalpel creating a pocket for the port.  An 8-French power port was then brought into the field.  The port fit easily into the pocket.  I extended the introducer sheath and dilator down the guidewire and then removed the guidewire and dilator.  I then attached the catheter to the port and cut it to appropriate length.  I then fed it down the peel-away sheath into the central venous system.  I accessed the port and good flush return was demonstrated.  Good placement in the superior vena cava appeared to be achieved on fluoroscopy.  I then instilled concentrated heparin solution into the port.  I sewed the port to the chest wall with 2 separate 3-0 Prolene sutures.  I then closed the subcutaneous tissue with interrupted 3-0 Vicryl sutures and closed the skin with a running 4-0 Monocryl.  Skin glue and Tegaderm were then applied.  The patient's right chest and axilla were then prepped and draped in usual sterile fashion.  The palpable malignancy was at the  12 o'clock position right at her previous scar.  I performed an elliptical incision on the chest to incorporate this previous scar and palpable mass and then take it below the areola.  I then first dissected out the superior skin flap with electrocautery going to the level of the clavicle and then down to the chest wall.  I then dissected the inferior flap going down to below the inframammary rib.  I had to mobilize the skin further in order to achieve closure of the mastectomy flaps.  I then slowly dissected the breast tissue off the chest wall with electrocautery. When I got to the area of the tumor, it appeared that it could be going to the pectoralis muscle.  So I took a margin of muscle with  the mastectomy specimen and incorporated this with the specimen.  I then worked my way toward the axilla.  Once I reached the axilla, I brought the Neoprobe onto the field.  There was no uptake of radioactive isotope in the axilla.  I also dissected into the axilla and saw no uptake of blue dye.  There was a superficial fat in this area which I incorporated into the mastectomy specimen.  Again, I palpated no enlarged lymph nodes in the axilla.  At this point, a decision was made to forego a complete axillary dissection.  Hopefully, there were lymph nodes that could be in the slight tilt __________ that was removed with the total mastectomy. I marked the lateral margin with a suture and sent this specimen to Pathology for evaluation.  I then irrigated the wound with saline. Hemostasis appeared to be achieved with cautery.  I made a separate skin incision and placed a 19-French Blake drain into the mastectomy site. This was sewn in place with a nylon suture.  I then closed the skin with interrupted 3-0 Vicryl sutures and running 4-0 Monocryl.  Steri-Strips, gauze, and a binder were then applied.  The patient tolerated the procedure well.  All the counts were correct at the end of the procedure.  The drain was placed to bulb suction.  The patient was then extubated in the operating room and taken in stable condition to the recovery room.     [name], M.D.     DB/MEDQ  D:  07/05/2015  T:  07/05/2015  Job:  [id number]

## 2015-07-05 NOTE — Progress Notes (Signed)
Assisted Dr. Crews with right, ultrasound guided, pectoralis block. Side rails up, monitors on throughout procedure. See vital signs in flow sheet. Tolerated Procedure well. 

## 2015-07-06 ENCOUNTER — Encounter (HOSPITAL_BASED_OUTPATIENT_CLINIC_OR_DEPARTMENT_OTHER): Payer: Self-pay | Admitting: Surgery

## 2015-07-06 DIAGNOSIS — D0511 Intraductal carcinoma in situ of right breast: Secondary | ICD-10-CM | POA: Diagnosis not present

## 2015-07-06 MED ORDER — OXYCODONE HCL 5 MG PO TABS: 5.0000 mg | ORAL_TABLET | ORAL | Status: DC | PRN

## 2015-07-06 NOTE — Discharge Instructions (Signed)
[facility], [place] [id number]  MASTECTOMY: POST OP INSTRUCTIONS  Always review your discharge instruction sheet given to you by the facility where your surgery was performed. IF YOU HAVE DISABILITY OR FAMILY LEAVE FORMS, YOU MUST BRING THEM TO THE OFFICE FOR PROCESSING.   DO NOT GIVE THEM TO YOUR DOCTOR. A prescription for pain medication may be given to you upon discharge.  Take your pain medication as prescribed, if needed.  If narcotic pain medicine is not needed, then you may take acetaminophen (Tylenol) or ibuprofen (Advil) as needed. 1. Take your usually prescribed medications unless otherwise directed. 2. If you need a refill on your pain medication, please contact your pharmacy.  They will contact our office to request authorization.  Prescriptions will not be filled after 5pm or on week-ends. 3. You should follow a light diet the first few days after arrival home, such as soup and crackers, etc.  Resume your normal diet the day after surgery. 4. Most patients will experience some swelling and bruising on the chest and underarm.  Ice packs will help.  Swelling and bruising can take several days to resolve.  5. It is common to experience some constipation if taking pain medication after surgery.  Increasing fluid intake and taking a stool softener (such as Colace) will usually help or prevent this problem from occurring.  A mild laxative (Milk of Magnesia or Miralax) should be taken according to package instructions if there are no bowel movements after 48 hours. 6. Unless discharge instructions indicate otherwise, leave your bandage dry and in place until your next appointment in 3-5 days.  You may take a limited sponge bath.  No tube baths or showers until the drains are removed.  You may have steri-strips (small skin tapes) in place directly over the incision.  These strips should be left on the skin for 7-10 days.  If your surgeon used skin glue on the incision, you may  shower in 24 hours.  The glue will flake off over the next 2-3 weeks.  Any sutures or staples will be removed at the office during your follow-up visit. 7. DRAINS:  If you have drains in place, it is important to keep a list of the amount of drainage produced each day in your drains.  Before leaving the hospital, you should be instructed on drain care.  Call our office if you have any questions about your drains. 8. ACTIVITIES:  You may resume regular (light) daily activities beginning the next day--such as daily self-care, walking, climbing stairs--gradually increasing activities as tolerated.  You may have sexual intercourse when it is comfortable.  Refrain from any heavy lifting or straining until approved by your doctor. a. You may drive when you are no longer taking prescription pain medication, you can comfortably wear a seatbelt, and you can safely maneuver your car and apply brakes. b. RETURN TO WORK:  __________________________________________________________ 9. You should see your doctor in the office for a follow-up appointment approximately 3-5 days after your surgery.  Your doctors nurse will typically make your follow-up appointment when she calls you with your pathology report.  Expect your pathology report 2-3 business days after your surgery.  You may call to check if you do not hear from [place] after three days.   10. OTHER INSTRUCTIONS: ______________________________________________________________________________________________ ____________________________________________________________________________________________  WHEN TO CALL YOUR DOCTOR: 1. Fever over 101.0 2. Nausea and/or vomiting 3. Extreme swelling or bruising 4. Continued bleeding from incision. 5. Increased pain, redness, or drainage from the  incision.  The clinic staff is available to answer your questions during regular business hours.  Please dont hesitate to call and ask to speak to one of the nurses for clinical  concerns.  If you have a medical emergency, go to the nearest emergency room or call 911.  A surgeon from [facility] is always on call at the hospital. [address] [place], [place], [place]  [address] ? [address], [place], [place]   [address] [phone] ? [phone] ? FAX [phone] Web site: [email or web address]  About my Jackson-Pratt Bulb Drain  What is a Jackson-Pratt bulb? A Jackson-Pratt is a soft, round device used to collect drainage. It is connected to a long, thin drainage catheter, which is held in place by one or two small stiches near your surgical incision site. When the bulb is squeezed, it forms a vacuum, forcing the drainage to empty into the bulb.  Emptying the Jackson-Pratt bulb- To empty the bulb: 1. Release the plug on the top of the bulb. 2. Pour the bulb's contents into a measuring container which your nurse will provide. 3. Record the time emptied and amount of drainage. Empty the drain(s) as often as your     doctor or nurse recommends.  Date                  Time                    Amount (Drain 1)                 Amount (Drain 2)  _____________________________________________________________________  _____________________________________________________________________  _____________________________________________________________________  _____________________________________________________________________  _____________________________________________________________________  _____________________________________________________________________  _____________________________________________________________________  _____________________________________________________________________  Squeezing the Jackson-Pratt Bulb- To squeeze the bulb: 1. Make sure the plug at the top of the bulb is open. 2. Squeeze the bulb tightly in your fist. You will hear air squeezing from the bulb. 3. Replace the plug while the bulb is squeezed. 4.  Use a safety pin to attach the bulb to your clothing. This will keep the catheter from     pulling at the bulb insertion site.  When to call your doctor- Call your doctor if:  Drain site becomes red, swollen or hot.  You have a fever greater than 101 degrees F.  There is oozing at the drain site.  Drain falls out (apply a guaze bandage over the drain hole and secure it with tape).  Drainage increases daily not related to activity patterns. (You will usually have more drainage when you are active than when you are resting.)  Drainage has a bad odor.

## 2015-07-06 NOTE — Progress Notes (Signed)
1 Day Post-Op  Subjective: Doing well Pain well controlled  Objective: Vital signs in last 24 hours: Temp:  [96.8 F (36 C)-97.9 F (36.6 C)] 97.1 F (36.2 C) (09/22 0630) Pulse Rate:  [63-85] 63 (09/22 0100) Resp:  [10-24] 18 (09/22 0630) BP: (99-162)/(51-84) 100/59 mmHg (09/22 0630) SpO2:  [96 %-100 %] 97 % (09/22 0630)    Intake/Output from previous day: 09/21 0701 - 09/22 0700 In: 4742 [P.O.:2572; I.V.:2170] Out: 5510 [Urine:5400; Drains:110] Intake/Output this shift:    Right mastectomy flaps ok Drain serosang  Lab Results:   Recent Labs  07/04/15 0928  WBC 6.0  HGB 13.7  HCT 42.1  PLT 213   BMET  Recent Labs  07/04/15 0928  NA 139  K 4.5  CO2 27  GLUCOSE 169*  BUN 16.9  CREATININE 0.8  CALCIUM 9.6   PT/INR No results for input(s): LABPROT, INR in the last 72 hours. ABG No results for input(s): PHART, HCO3 in the last 72 hours.  Invalid input(s): PCO2, PO2  Studies/Results: Nm Sentinel Node Inj-no Rpt (breast)  07/05/2015   CLINICAL DATA: right breast cancer   Sulfur colloid was injected intradermally by the nuclear medicine  technologist for breast cancer sentinel node localization.    Dg Chest Port 1 View  07/05/2015   CLINICAL DATA:  Port-A-Cath insertion  EXAM: PORTABLE CHEST - 1 VIEW  COMPARISON:  CT chest of 06/13/2015 and chest x-ray of 03/10/2013  FINDINGS: A left-sided Port-A-Cath is now present. The tip overlies the mid SVC. No pneumothorax is seen. The lungs are clear. A surgical drain overlies the right chest. Surgical clips are noted in the right upper quadrant. Heart size is stable  IMPRESSION: Left-sided Port-A-Cath tip overlies the mid SVC. No pneumothorax is seen.   Electronically Signed   By: Ivar Drape M.D.   On: 07/05/2015 11:03   Dg Fluoro Guide Cv Line-no Report  07/05/2015   CLINICAL DATA:    FLOURO GUIDE CV LINE  Fluoroscopy was utilized by the requesting physician.  No radiographic  interpretation.      Anti-infectives: Anti-infectives    Start     Dose/Rate Route Frequency Ordered Stop   07/05/15 0648  ciprofloxacin (CIPRO) IVPB 400 mg     400 mg 200 mL/hr over 60 Minutes Intravenous On call to O.R. 07/05/15 0648 07/05/15 0911      Assessment/Plan: s/p Procedure(s): RIGHT MASTECTOMY WITH SENTINEL NODE BIOPSY AND PORT A CATH INSERTION (Right) INSERTION PORT-A-CATH (Left)  Discharge home     [place] A 07/06/2015

## 2015-07-06 NOTE — Discharge Summary (Signed)
Physician Discharge Summary  Patient ID: RETHA BITHER MRN: [id number] DOB/AGE: Jul 28, 1956 59 y.o.  Admit date: 07/05/2015 Discharge date: 07/06/2015  Admission Diagnoses:  Discharge Diagnoses:  Active Problems:   Breast cancer   Discharged Condition: good  Hospital Course: uneventful post op recovery.  Discharged home post op day one  Consults: None  Significant Diagnostic Studies:   Treatments: surgery: right mastectomy, port a cath insertion  Discharge Exam: Blood pressure 100/59, pulse 63, temperature 97.1 F (36.2 C), temperature source Oral, resp. rate 18, height '5\' 6"'$  (1.676 m), weight 62.143 kg (137 lb), SpO2 97 %. General appearance: alert, cooperative and no distress Resp: clear to auscultation bilaterally Cardio: regular rate and rhythm, S1, S2 normal, no murmur, click, rub or gallop Incision/Wound:clean  Disposition: 01-Home or Self Care     Medication List    TAKE these medications        b complex vitamins tablet  Take 1 tablet by mouth daily.     calcium-vitamin D 500-200 MG-UNIT per tablet  Commonly known as:  OSCAL WITH D  Take 2 tablets by mouth daily.     famotidine 10 MG chewable tablet  Commonly known as:  PEPCID AC  Chew 10 mg by mouth daily as needed for heartburn.     fish oil-omega-3 fatty acids 1000 MG capsule  Take 2 g by mouth daily.     glucosamine-chondroitin 500-400 MG tablet  Take 1 tablet by mouth 3 (three) times daily.     ibuprofen 100 MG chewable tablet  Commonly known as:  ADVIL,MOTRIN  Chew 100 mg by mouth every 8 (eight) hours as needed.     magnesium (amino acid chelate) 133 MG tablet  Take 1 tablet by mouth 2 (two) times daily.     multivitamin with minerals tablet  Take 1 tablet by mouth daily.     oxyCODONE 5 MG immediate release tablet  Commonly known as:  Oxy IR/ROXICODONE  Take 1-2 tablets (5-10 mg total) by mouth every 4 (four) hours as needed for moderate pain.           Follow-up Information     Follow up with [facility], MD. Schedule an appointment as soon as possible for a visit on 07/14/2015.   Specialty:  General Surgery   Why:  For wound re-check   Contact information:   [address] [place] [address] [phone]       Signed: Harl Bowie 07/06/2015, 7:04 AM

## 2015-07-11 ENCOUNTER — Ambulatory Visit: Payer: BC Managed Care – PPO | Admitting: Oncology

## 2015-07-12 ENCOUNTER — Other Ambulatory Visit: Payer: Self-pay | Admitting: Oncology

## 2015-07-17 ENCOUNTER — Telehealth: Payer: Self-pay | Admitting: *Deleted

## 2015-07-17 NOTE — Telephone Encounter (Signed)
CT CHEST NOT SCHEDULED.

## 2015-07-18 ENCOUNTER — Other Ambulatory Visit: Payer: Self-pay | Admitting: *Deleted

## 2015-07-18 ENCOUNTER — Other Ambulatory Visit: Payer: BLUE CROSS/BLUE SHIELD

## 2015-07-18 ENCOUNTER — Encounter: Payer: Self-pay | Admitting: *Deleted

## 2015-07-18 ENCOUNTER — Ambulatory Visit (HOSPITAL_BASED_OUTPATIENT_CLINIC_OR_DEPARTMENT_OTHER): Payer: BLUE CROSS/BLUE SHIELD | Admitting: Nurse Practitioner

## 2015-07-18 ENCOUNTER — Telehealth: Payer: Self-pay | Admitting: Oncology

## 2015-07-18 ENCOUNTER — Encounter: Payer: Self-pay | Admitting: Nurse Practitioner

## 2015-07-18 ENCOUNTER — Other Ambulatory Visit: Payer: Self-pay | Admitting: Nurse Practitioner

## 2015-07-18 VITALS — BP 137/73 | HR 75 | Temp 98.5°F | Resp 18 | Ht 66.0 in | Wt 138.1 lb

## 2015-07-18 DIAGNOSIS — R918 Other nonspecific abnormal finding of lung field: Secondary | ICD-10-CM | POA: Diagnosis not present

## 2015-07-18 DIAGNOSIS — Z853 Personal history of malignant neoplasm of breast: Secondary | ICD-10-CM | POA: Diagnosis not present

## 2015-07-18 DIAGNOSIS — Z23 Encounter for immunization: Secondary | ICD-10-CM

## 2015-07-18 DIAGNOSIS — C50911 Malignant neoplasm of unspecified site of right female breast: Secondary | ICD-10-CM

## 2015-07-18 DIAGNOSIS — C50411 Malignant neoplasm of upper-outer quadrant of right female breast: Secondary | ICD-10-CM | POA: Diagnosis not present

## 2015-07-18 MED ORDER — PROCHLORPERAZINE MALEATE 10 MG PO TABS: 10.0000 mg | ORAL_TABLET | Freq: Four times a day (QID) | ORAL | Status: DC | PRN

## 2015-07-18 MED ORDER — LIDOCAINE-PRILOCAINE 2.5-2.5 % EX CREA: TOPICAL_CREAM | CUTANEOUS | Status: DC

## 2015-07-18 MED ORDER — ONDANSETRON HCL 8 MG PO TABS: 8.0000 mg | ORAL_TABLET | Freq: Two times a day (BID) | ORAL | Status: DC

## 2015-07-18 MED ORDER — INFLUENZA VAC SPLIT QUAD 0.5 ML IM SUSY
0.5000 mL | PREFILLED_SYRINGE | Freq: Once | INTRAMUSCULAR | Status: AC
  Administered 2015-07-18: 0.5 mL via INTRAMUSCULAR
  Filled 2015-07-18: qty 0.5

## 2015-07-18 MED ORDER — LORAZEPAM 0.5 MG PO TABS: 0.5000 mg | ORAL_TABLET | Freq: Every day | ORAL | Status: DC

## 2015-07-18 MED ORDER — DEXAMETHASONE 4 MG PO TABS: 8.0000 mg | ORAL_TABLET | Freq: Two times a day (BID) | ORAL | Status: DC

## 2015-07-18 NOTE — Telephone Encounter (Signed)
Added genetics/lab appointments for 10/6 and adjusted 11/1 lab. All other appointments already on schedule per 10/4 pofs.

## 2015-07-18 NOTE — Progress Notes (Signed)
ID: Crystal [name]   DOB: [birth year]  MR#: [id number]  WUJ#:[id number]  PCP: Kandice Hams, MD GYN: SU: [name] MD OTHER MD: Earle Gell, Daneen Schick, Arnoldo Hooker Thimmappa]  CHIEF COMPLAINT: recurrent estrogen receptor positive breast cancer  CURRENT TREATMENT: Awaiting definitive surgery  BREAST CANCER HISTORY:   From the original intake note:  The patient had bilateral diagnostic mammography 02/12/2011.  She has a history of cystic changes noted on prior studies in April 2011 and April 2010.  On the Feb 12, 2011 study there were new calcifications identified superiorly in the right breast, without any obvious mass.  The patient was brought back for further studies, 02/19/2011 and Dr. Marcelo Baldy was able to localize the calcifications under stereotactic guidance.  The biopsy ([id number]) showed high-grade ductal carcinoma with papillary features which was 70% estrogen receptor positive, 0% progesterone receptor positive.  There was no definitive invasion and therefore further studies were not performed.   Bilateral breast MRIs were obtained Feb 26, 2011.  This showed marked nodular enhancement throughout both breasts, and in the upper central portion there was a post biopsy area of change measuring up to 3.7 cm.  Most of this is a fluid cavity with a thin rim of enhancement.  There were no suspicious areas in the left breast.  No internal mammary or axillary lymph nodes.  Of course, the patient has a history of hepatic adenomas and these were noted incidentally on the MRI. Her definitive surgical treatment and subsequent history are as detailed below.  INTERVAL HISTORY: Crystal [name] returns today for follow up of her now recurrent breast cancer. She is status post a right mastectomy and port placement on 07/05/15. She is here to discuss her adjuvant chemotherapy regimen, cyclophosphamide and docetaxel q21 days x 4, with neulasta onpro given on day 2 for granulocyte support.   REVIEW OF  SYSTEMS: Crystal [name] is healing well from surgery. She has used almost none of the narcotics she was given for pain. The drain was recently removed. She has some residual bruising and redness. She denies fevers, chills, nausea, vomiting, or change sin bowel or bladder habits. A detailed review of systems is otherwise entirely negative.  PAST MEDICAL HISTORY: Past Medical History  Diagnosis Date  . History of small bowel obstruction   . Liver masses   . Insomnia   . GERD (gastroesophageal reflux disease)   . Cancer     right breast  . Complication of anesthesia     hard to wake up  history of small-bowel obstruction with partial colectomy March 2001.  The patient has a history of hepatic adenomas.  She has had liver resections x2.  The data for this is at [facility] and we will try to obtain it.  She has also had incisional hernia repair.  She has a history of osteopenia, and she has a history of multiple anti-red cell antibodies making a transfusion difficult.  The patient tells me that she has a history of low calcium and a tendency to get liver injections.  PAST SURGICAL HISTORY: Past Surgical History  Procedure Laterality Date  . Liver surgery    . Laparotomies    . Hernia repair      incisional hernia repair x3  . Breast surgery Right   . Mastectomy w/ sentinel node biopsy Right 07/05/2015    Procedure: RIGHT MASTECTOMY WITH SENTINEL NODE BIOPSY AND PORT A CATH INSERTION;  Surgeon: [name], MD;  Location: [place];  Service: General;  Laterality: Right;  .  Portacath placement Left 07/05/2015    Procedure: INSERTION PORT-A-CATH;  Surgeon: [name], MD;  Location: [place];  Service: General;  Laterality: Left;    FAMILY HISTORY Family History  Problem Relation Age of Onset  . Ovarian cancer Mother   . Breast cancer Maternal Grandmother   . Stomach cancer Maternal Grandfather   The patient's mother died from ovarian cancer at the age of 78.   The patient's father is alive at age 68.  She has 2 brothers, no sisters.  A maternal grandmother was diagnosed with breast cancer at the age of 5, and the maternal grandfather had stomach cancer at the age of 32.  GYNECOLOGIC HISTORY: The patient had menarche at age 62.  Her last menstrual period was in 2005.  She never used hormone replacement.  She used birth control briefly to control bleeding prior to 1980.  She was 59 years old at the birth of her first child.  She started having mammograms at age 37 because of a strong family history.  SOCIAL HISTORY: Crystal [name] works as a Programmer, applications for [employer].   She also heads the local "[employer]" Her husband, [name], is a retired Engineer, maintenance from a [employer] locally.  Son, [name], lives in [place] and works for that [employer]  Daughter, [name], lives in [place] and is a homemaker.   ADVANCED DIRECTIVES: in place  HEALTH MAINTENANCE: Social History  Substance Use Topics  . Smoking status: Never Smoker   . Smokeless tobacco: Not on file  . Alcohol Use: 1.2 oz/week    2 Glasses of wine per week     Comment: social     Colonoscopy:  PAP:  Bone density:  Lipid panel:  Allergies  Allergen Reactions  . Promethazine Hcl Other (See Comments)    Causes shaking  . Tylenol [Acetaminophen]     Avoids Tylenol products due to liver disease   . Penicillins Rash    Current Outpatient Prescriptions  Medication Sig Dispense Refill  . b complex vitamins tablet Take 1 tablet by mouth daily.      . calcium-vitamin D (OSCAL WITH D) 500-200 MG-UNIT per tablet Take 2 tablets by mouth daily.    . famotidine (PEPCID AC) 10 MG chewable tablet Chew 10 mg by mouth daily as needed for heartburn.    . fish oil-omega-3 fatty acids 1000 MG capsule Take 2 g by mouth daily.      Marland Kitchen glucosamine-chondroitin 500-400 MG tablet Take 1 tablet by mouth 3 (three) times daily.      Marland Kitchen ibuprofen  (ADVIL,MOTRIN) 200 MG tablet Take 200 mg by mouth every 6 (six) hours as needed.    . Multiple Vitamins-Minerals (MULTIVITAMIN WITH MINERALS) tablet Take 1 tablet by mouth daily.      . TURMERIC PO Take 400 mg by mouth daily. Pt takes 2 tablets 400 mg each= 800 mg total    . oxyCODONE (OXY IR/ROXICODONE) 5 MG immediate release tablet Take 1-2 tablets (5-10 mg total) by mouth every 4 (four) hours as needed for moderate pain. (Patient not taking: Reported on 07/18/2015) 40 tablet 0   No current facility-administered medications for this visit.    OBJECTIVE: Middle-aged white woman  Who appears well  Filed Vitals:   07/18/15 1337  BP: 137/73  Pulse: 75  Temp: 98.5 F (36.9 C)  Resp: 18     Body mass index is 22.3 kg/(m^2).    ECOG  FS: 0  Skin: warm, dry  HEENT: sclerae anicteric, conjunctivae pink, oropharynx clear. No thrush or mucositis.  Lymph Nodes: No cervical or supraclavicular lymphadenopathy  Lungs: clear to auscultation bilaterally, no rales, wheezes, or rhonci  Heart: regular rate and rhythm  Abdomen: round, soft, non tender, positive bowel sounds  Musculoskeletal: No focal spinal tenderness, no peripheral edema  Neuro: non focal, well oriented, positive affect  Breast: right breast status post recent mastectomy. Incision line clean and well approximated, held with steri strips. Mild bruising to the inner portion. Left upper chest portacath recently placed with similar bruising. Healing well.   LAB RESULTS: Lab Results  Component Value Date   WBC 6.0 07/04/2015   NEUTROABS 3.9 07/04/2015   HGB 13.7 07/04/2015   HCT 42.1 07/04/2015   MCV 88.1 07/04/2015   PLT 213 07/04/2015      Chemistry      Component Value Date/Time   NA 139 07/04/2015 0928   NA 139 03/10/2013 0848   K 4.5 07/04/2015 0928   K 3.9 03/10/2013 0848   CL 102 03/15/2013 0912   CL 100 03/10/2013 0848   CO2 27 07/04/2015 0928   CO2 23 03/10/2013 0848   BUN 16.9 07/04/2015 0928   BUN 20 03/10/2013  0848   CREATININE 0.8 07/04/2015 0928   CREATININE 0.69 03/10/2013 0848      Component Value Date/Time   CALCIUM 9.6 07/04/2015 0928   CALCIUM 10.0 03/10/2013 0848   ALKPHOS 118 07/04/2015 0928   ALKPHOS 131* 03/10/2013 0848   AST 22 07/04/2015 0928   AST 29 03/10/2013 0848   ALT 30 07/04/2015 0928   ALT 36* 03/10/2013 0848   BILITOT 0.32 07/04/2015 0928   BILITOT 0.3 03/10/2013 0848       Lab Results  Component Value Date   LABCA2 28 10/20/2012    No components found for: EHMCN470  No results for input(s): INR in the last 168 hours.  Urinalysis No results found for: COLORURINE  STUDIES: Nm Sentinel Node Inj-no Rpt (breast)  07/05/2015   CLINICAL DATA: right breast cancer   Sulfur colloid was injected intradermally by the nuclear medicine  technologist for breast cancer sentinel node localization.    Dg Chest Port 1 View  07/05/2015   CLINICAL DATA:  Port-A-Cath insertion  EXAM: PORTABLE CHEST - 1 VIEW  COMPARISON:  CT chest of 06/13/2015 and chest x-ray of 03/10/2013  FINDINGS: A left-sided Port-A-Cath is now present. The tip overlies the mid SVC. No pneumothorax is seen. The lungs are clear. A surgical drain overlies the right chest. Surgical clips are noted in the right upper quadrant. Heart size is stable  IMPRESSION: Left-sided Port-A-Cath tip overlies the mid SVC. No pneumothorax is seen.   Electronically Signed   By: Ivar Drape M.D.   On: 07/05/2015 11:03   Dg Fluoro Guide Cv Line-no Report  07/05/2015   CLINICAL DATA:    FLOURO GUIDE CV LINE  Fluoroscopy was utilized by the requesting physician.  No radiographic  interpretation.     ASSESSMENT: 59 y.o.  BRCA 1-2 and p-53 negative [place] woman status post right lumpectomy and sentinel lymph node sampling June 2012 for a T1a N0, Stage IA  invasive ductal carcinoma, grade 3, estrogen receptor 98% positive, progesterone receptor 97% positive, with an MIB-1 of 5% and no HER-2 amplification and progesterone receptor  positive, HER2/neu negative.   (a) note that the initial Right breast biopsy showed DCIS w papillary architecture, estrogen receptor 70% positive,  progesterone receptor negative  (1) completed radiation September 2012.   (2) on letrozole since July 2012   RECURRENT DISEASE: (3) right breast upper outer quadrant biopsy 05/23/2015 was positive for a clinical T1c N0, stage IA invasive ductal carcinoma, grade 2, estrogen receptor 60% positive with moderate staining intensity, progesterone receptor negative, with no HER-2 ossification and an MIB-1 of 90%.  (4) to begin cyclophosphamide and docetaxel q21 days x 4, with neulasta onpro given on day 2 for granulocyte support  PLAN:  Crystal [name] and I spent about 40 minutes discussing her upcoming chemotherapy plans. She was made aware of potential side effects and toxicities of both cyclophosphamide and docetaxel. She attended chemotherapy school earlier today so a lot of this information was a review for her. She was made aware of her neulasta injections, including the fact that these will be administered with an onbody injection that she will need to wear until the device is no longer active. We reviewed her antiemetic schedule, and she feels comfortable with every medication listed as well as the indication and potential side effects. These medicines were sent to her pharmacy today.   I have placed a genetics counseling referral today, per patient request.   I have also ordered a limited CT of the right upper lobe to be performed later this month to evaluate a small mass to this area noted on the CT from 06/13/15.  Crystal [name] will return on Tuesday for her first cycle of treatment. She understands and agrees with this plan. She knows the goal of treatment in her case is cure. She has been encouraged to call with any issues that might arise before her next visit here.  Crystal [name]    07/18/2015

## 2015-07-18 NOTE — Telephone Encounter (Signed)
Added appt for NOV per pof...the pt will get new sched at oct visit

## 2015-07-20 ENCOUNTER — Ambulatory Visit (HOSPITAL_BASED_OUTPATIENT_CLINIC_OR_DEPARTMENT_OTHER): Payer: BLUE CROSS/BLUE SHIELD | Admitting: Genetic Counselor

## 2015-07-20 ENCOUNTER — Encounter: Payer: Self-pay | Admitting: Genetic Counselor

## 2015-07-20 ENCOUNTER — Other Ambulatory Visit: Payer: BLUE CROSS/BLUE SHIELD

## 2015-07-20 DIAGNOSIS — D134 Benign neoplasm of liver: Secondary | ICD-10-CM

## 2015-07-20 DIAGNOSIS — Z808 Family history of malignant neoplasm of other organs or systems: Secondary | ICD-10-CM

## 2015-07-20 DIAGNOSIS — Z809 Family history of malignant neoplasm, unspecified: Secondary | ICD-10-CM

## 2015-07-20 DIAGNOSIS — Z315 Encounter for genetic counseling: Secondary | ICD-10-CM

## 2015-07-20 DIAGNOSIS — C50411 Malignant neoplasm of upper-outer quadrant of right female breast: Secondary | ICD-10-CM

## 2015-07-20 DIAGNOSIS — Z8371 Family history of colonic polyps: Secondary | ICD-10-CM

## 2015-07-20 DIAGNOSIS — Z803 Family history of malignant neoplasm of breast: Secondary | ICD-10-CM

## 2015-07-20 DIAGNOSIS — C50911 Malignant neoplasm of unspecified site of right female breast: Secondary | ICD-10-CM

## 2015-07-20 DIAGNOSIS — Z8041 Family history of malignant neoplasm of ovary: Secondary | ICD-10-CM

## 2015-07-20 DIAGNOSIS — Z800 Family history of malignant neoplasm of digestive organs: Secondary | ICD-10-CM

## 2015-07-20 DIAGNOSIS — Z8489 Family history of other specified conditions: Secondary | ICD-10-CM

## 2015-07-20 NOTE — Progress Notes (Signed)
REFERRING PROVIDER: Lurline Del, MD  PRIMARY PROVIDER:  Kandice Hams, MD  PRIMARY REASON FOR VISIT:  1. Malignant neoplasm of right female breast, unspecified site of breast ([place])   2. Family history of ovarian cancer   3. Family history of cancer in mother   17. Family history of breast cancer in female   11. Family history of stomach cancer   6. Family history of colon cancer   7. Family history of colonic polyps   8. Family history of brain tumor   9. Hepatic adenoma      HISTORY OF PRESENT ILLNESS:   Crystal [name], a 59 y.o. female, was first seen for a [facility] cancer genetics consultation in 2012 due to a personal history of breast cancer and family history of breast, ovarian, and other cancers.  At that time she had negative BRACAnalysis and TP53 testing.  Crystal [name] presents to clinic today to discuss the possibility of a hereditary predisposition to cancer, updated genetic testing options, and to further clarify her future cancer risks, as well as potential cancer risks for family members.   In May 2012, at the age of 54, Crystal [name] was diagnosed with invasive ductal carcinoma and DCIS of the right breast. Hormone receptor status was ER/PR+, Her2-.  This was treated with lumpectomy.  In 2016, Crystal [name] was diagnosed with a potential recurrence of her breast cancer which was treated with right mastectomy.  Hormone receptor status was ER+, PR-, Her2-. Crystal [name] also has a history of hepatic adenomas.   CANCER HISTORY:   No history exists.     HORMONAL RISK FACTORS:  Menarche was at age 15.  First live birth at age 66.  OCP use for approximately 3 months  Ovaries intact: yes.  Hysterectomy: no.  Menopausal status: postmenopausal.  HRT use: 0 years. Colonoscopy: yes; normal. Mammogram within the last year: yes. Number of breast biopsies: 2. Up to date with pelvic exams:  yes. 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 History of small bowel obstruction   . Liver masses   . Insomnia   . GERD (gastroesophageal reflux disease)   . Cancer [facility])     right breast  . Complication of anesthesia     hard to wake up    Past Surgical History  Procedure Laterality Date  . Liver surgery    . Laparotomies    . Hernia repair      incisional hernia repair x3  . Breast surgery Right   . Mastectomy w/ sentinel node biopsy Right 07/05/2015    Procedure: RIGHT MASTECTOMY WITH SENTINEL NODE BIOPSY AND PORT A CATH INSERTION;  Surgeon: [name], MD;  Location: [facility];  Service: General;  Laterality: Right;  . Portacath placement Left 07/05/2015    Procedure: INSERTION PORT-A-CATH;  Surgeon: [name], MD;  Location: [place];  Service: General;  Laterality: Left;    Social History   Social History  . Marital Status: Married    Spouse Name: N/A  . Number of Children: N/A  . Years of Education: N/A   Social History Main Topics  . Smoking status: Never Smoker   . Smokeless tobacco: Never Used  . Alcohol Use: 0.6 oz/week    1 Glasses of wine per week     Comment: social  .  Drug Use: No  . Sexual Activity: Not on file   Other Topics Concern  . Not on file   Social History Narrative     FAMILY HISTORY:  We obtained a detailed, 4-generation family history.  Significant diagnoses are listed below: Family History  Problem Relation Age of Onset  . Ovarian cancer Mother 48    "metastasized to breasts"  . Breast cancer Maternal Grandmother 48    2-3 recurrences  . Stomach cancer Maternal Grandfather     dx. 47-49; metastatic stomach cancer  . Colon polyps Father     about 2-3 polyps removed at each colonoscopy  . Colon polyps Brother     "a few"  . Other Maternal Aunt     +TAH  . Colon cancer Paternal Aunt 90  . Brain cancer Daughter 17    astrocytoma   . Breast cancer Cousin     dx. 50s    Crystal [name] has one son, age 69, and one daughter, age 59.  Her daughter was diagnosed with an astrocytoma at the age of 47.  Crystal [name] currently has three grandchildren.  She has two full brothers, both of whom are in their 70s and have not had cancer.  One brother has a history of "a few" colon polyps.  Crystal [name] has limited contact with her other brother.  She has two nephews who are also cancer-free.  Crystal [name] mother died at 74 of metastatic ovarian cancer.  Her father died at 63 and was never diagnosed with cancer.  He had a history of colon polyps--approximately 2-3 colon polyps were removed at each colonoscopy.    Crystal [name] mother had one full sister who died of alcohol abuse at 21.  This maternal aunt also had a history of a TAH for an unspecified reason.  She had one son who died in a motor vehicle accident in his 51s and a daughter who Crystal [name] is not in touch with.  Crystal [name] maternal grandmother died of recurrent breast cancer at 29; she was first diagnosed at 35 and reportedly had 2-3 recurrences.  This grandmother had two brothers who passed away in their 50s/60s and never had cancer.  Crystal [name] maternal grandfather died of metastatic stomach cancer in his late 63s.    Crystal [name] father had one full sister who passed away at 25.  She had a history of colon cancer at 8.  She had four daughters, three of whom are between the ages of 51-76 and have not had cancer.  One daughter was diagnosed with and passed away from breast cancer in her early 15s.  Crystal [name] paternal grandmother died of TB in her 28s.  Her paternal grandfather died of shingles at 43; he was an only child.    Crystal [name] is unaware of any other relatives having had genetic testing.  Patient's maternal ancestors are of Irish/German descent, and paternal ancestors are of [place] descent. There is no reported Ashkenazi Jewish ancestry. There is no known  consanguinity.  GENETIC COUNSELING ASSESSMENT: Crystal [name] is a 59 y.o. female with a personal and family history of cancer which is somewhat suggestive of a hereditary cancer syndrome and predisposition to cancer. We, therefore, discussed and recommended the following at today's visit.   DISCUSSION: We reviewed the characteristics, features and inheritance patterns of hereditary cancer syndromes, particularly those caused by mutations within newer breast cancer-related genes that we have recently begun testing for, such as BRIP1. We also discussed  genetic testing, including the appropriate family members to test, the process of testing, insurance coverage and turn-around-time for results. We discussed the implications of a negative, positive and/or variant of uncertain significant result. Based on the diverse cancers in the family history and the family history of colon polyps, we recommended Crystal [name] pursue genetic testing for the 32-gene Comprehensive Cancer Panel through [facility] Hope Pigeon, MD).  The Comprehensive Cancer Panel offered by [facility] includes sequencing and deletion/duplication analysis of the following 30 genes: APC, ATM, AXIN2, BARD1, BMPR1A, BRCA1, BRCA2, BRIP1, CDH1, CDK4, CDKN2A, CHEK2, FANCC, MLH1, MSH2, MSH6, MUTYH, NBN, PALB2, PMS2, POLD1, POLE, PTEN, RAD51C, RAD51D, SMAD4, STK11, TP53, VHL, and XRCC2.  This panel also includes deletion/duplication analysis (without sequencing) for two genes, EPCAM and SCG5/GREM1.   Based on Ms. Sano's personal and family history of cancer, she meets medical criteria for genetic testing. Despite that she meets criteria, she may still have an out of pocket cost. We discussed that if her out of pocket cost for testing is over $100, the laboratory will call and confirm whether she wants to proceed with testing.  If the out of pocket cost of testing is less than $100 she will be billed by the genetic testing laboratory.   PLAN:  After considering the risks, benefits, and limitations, Ms. Ruedas  provided informed consent to pursue genetic testing and the blood sample was sent to [facility] for analysis of the 32-gene Comprehensive Cancer Panel Test. Results should be available within approximately 2-3 weeks' time, at which point they will be disclosed by telephone to Ms. Ludwig, as will any additional recommendations warranted by these results. Ms. Castillo will receive a summary of her genetic counseling visit and a copy of her results once available. This information will also be available in Epic. We encouraged Ms. Pellot to remain in contact with cancer genetics annually so that we can continuously update the family history and inform her of any changes in cancer genetics and testing that may be of benefit for her family. Ms. Sparlin questions were answered to her satisfaction today. Our contact information was provided should additional questions or concerns arise.  Thank you for the referral and allowing [place] to share in the care of your patient.   Jeanine Luz, MS Genetic Counselor [email or web address] Phone: [phone]  The patient was seen for a total of 60 minutes in face-to-face genetic counseling.  This patient was discussed with Drs. Magrinat, Lindi Adie and/or Burr Medico who agrees with the above.    _______________________________________________________________________ For Office Staff:  Number of people involved in session: 1 Was an Intern/ student involved with case: no

## 2015-07-21 ENCOUNTER — Encounter (HOSPITAL_BASED_OUTPATIENT_CLINIC_OR_DEPARTMENT_OTHER): Payer: Self-pay | Admitting: Surgery

## 2015-07-24 ENCOUNTER — Other Ambulatory Visit: Payer: Self-pay

## 2015-07-24 DIAGNOSIS — C50911 Malignant neoplasm of unspecified site of right female breast: Secondary | ICD-10-CM

## 2015-07-25 ENCOUNTER — Other Ambulatory Visit (HOSPITAL_BASED_OUTPATIENT_CLINIC_OR_DEPARTMENT_OTHER): Payer: BLUE CROSS/BLUE SHIELD

## 2015-07-25 ENCOUNTER — Ambulatory Visit (HOSPITAL_COMMUNITY)
Admission: RE | Admit: 2015-07-25 | Discharge: 2015-07-25 | Disposition: A | Discharge status: Home / Self Care | Payer: BLUE CROSS/BLUE SHIELD | Source: Ambulatory Visit | Attending: Nurse Practitioner | Admitting: Nurse Practitioner

## 2015-07-25 ENCOUNTER — Encounter (HOSPITAL_COMMUNITY): Payer: Self-pay

## 2015-07-25 ENCOUNTER — Ambulatory Visit (HOSPITAL_BASED_OUTPATIENT_CLINIC_OR_DEPARTMENT_OTHER): Payer: BLUE CROSS/BLUE SHIELD | Admitting: Oncology

## 2015-07-25 ENCOUNTER — Ambulatory Visit: Payer: BLUE CROSS/BLUE SHIELD

## 2015-07-25 VITALS — BP 127/64 | HR 75 | Temp 98.0°F | Resp 18 | Ht 66.0 in | Wt 135.5 lb

## 2015-07-25 DIAGNOSIS — R911 Solitary pulmonary nodule: Secondary | ICD-10-CM | POA: Diagnosis not present

## 2015-07-25 DIAGNOSIS — C50411 Malignant neoplasm of upper-outer quadrant of right female breast: Secondary | ICD-10-CM

## 2015-07-25 DIAGNOSIS — R918 Other nonspecific abnormal finding of lung field: Secondary | ICD-10-CM | POA: Diagnosis not present

## 2015-07-25 DIAGNOSIS — C50911 Malignant neoplasm of unspecified site of right female breast: Secondary | ICD-10-CM | POA: Insufficient documentation

## 2015-07-25 DIAGNOSIS — Z170 Estrogen receptor positive status [ER+]: Secondary | ICD-10-CM

## 2015-07-25 DIAGNOSIS — Z9011 Acquired absence of right breast and nipple: Secondary | ICD-10-CM | POA: Diagnosis not present

## 2015-07-25 DIAGNOSIS — K769 Liver disease, unspecified: Secondary | ICD-10-CM | POA: Diagnosis not present

## 2015-07-25 LAB — COMPREHENSIVE METABOLIC PANEL (CC13)
ALK PHOS: 153 U/L — AB (ref 40–150)
ALT: 34 U/L (ref 0–55)
AST: 15 U/L (ref 5–34)
Albumin: 4.2 g/dL (ref 3.5–5.0)
Anion Gap: 12 mEq/L — ABNORMAL HIGH (ref 3–11)
BILIRUBIN TOTAL: 0.36 mg/dL (ref 0.20–1.20)
BUN: 17.1 mg/dL (ref 7.0–26.0)
CALCIUM: 9.8 mg/dL (ref 8.4–10.4)
CO2: 22 mEq/L (ref 22–29)
Chloride: 105 mEq/L (ref 98–109)
Creatinine: 0.8 mg/dL (ref 0.6–1.1)
EGFR: 77 mL/min/{1.73_m2} — ABNORMAL LOW (ref >90)
GLUCOSE: 200 mg/dL — AB (ref 70–140)
POTASSIUM: 3.5 meq/L (ref 3.5–5.1)
SODIUM: 139 meq/L (ref 136–145)
Total Protein: 7.6 g/dL (ref 6.4–8.3)

## 2015-07-25 LAB — CBC WITH DIFFERENTIAL/PLATELET
BASO%: 0 % (ref 0.0–2.0)
BASOS ABS: 0 10*3/uL (ref 0.0–0.1)
EOS%: 0 % (ref 0.0–7.0)
Eosinophils Absolute: 0 10*3/uL (ref 0.0–0.5)
HEMATOCRIT: 39.4 % (ref 34.8–46.6)
HGB: 12.8 g/dL (ref 11.6–15.9)
LYMPH#: 1.3 10*3/uL (ref 0.9–3.3)
LYMPH%: 8.3 % — AB (ref 14.0–49.7)
MCH: 29 pg (ref 25.1–34.0)
MCHC: 32.5 g/dL (ref 31.5–36.0)
MCV: 89.1 fL (ref 79.5–101.0)
MONO#: 0.4 10*3/uL (ref 0.1–0.9)
MONO%: 2.6 % (ref 0.0–14.0)
NEUT#: 14.2 10*3/uL — ABNORMAL HIGH (ref 1.5–6.5)
NEUT%: 89.1 % — AB (ref 38.4–76.8)
Platelets: 266 10*3/uL (ref 145–400)
RBC: 4.42 10*6/uL (ref 3.70–5.45)
RDW: 13 % (ref 11.2–14.5)
WBC: 15.9 10*3/uL — ABNORMAL HIGH (ref 3.9–10.3)

## 2015-07-25 MED ORDER — IOHEXOL 300 MG/ML  SOLN
75.0000 mL | Freq: Once | INTRAMUSCULAR | Status: AC | PRN
  Administered 2015-07-25: 75 mL via INTRAVENOUS

## 2015-07-25 NOTE — Progress Notes (Signed)
ID: [name]   DOB: [birth year]  MR#: [id number]  PIR#:[id number]  PCP: Kandice Hams, MD GYN: SU: [name] MD OTHER MD: Earle Gell, Daneen Schick, Arnoldo Hooker Th[place]mappa]  CHIEF COMPLAINT: recurrent estrogen receptor positive breast cancer  CURRENT TREATMENT: Awaiting definitive surgery  BREAST CANCER HISTORY:   From the original intake note:  The patient had bilateral diagnostic mammography 02/12/2011.  She has a history of cystic changes noted on prior studies in April 2011 and April 2010.  On the Feb 12, 2011 study there were new calcifications identified superiorly in the right breast, without any obvious mass.  The patient was brought back for further studies, 02/19/2011 and Dr. Marcelo Baldy was able to localize the calcifications under stereotactic guidance.  The biopsy ([id number]) showed high-grade ductal carcinoma with papillary features which was 70% estrogen receptor positive, 0% progesterone receptor positive.  There was no definitive invasion and therefore further studies were not performed.   Bilateral breast MRIs were obtained Feb 26, 2011.  This showed marked nodular enhancement throughout both breasts, and in the upper central portion there was a post biopsy area of change measuring up to 3.7 cm.  Most of this is a fluid cavity with a thin rim of enhancement.  There were no suspicious areas in the left breast.  No internal mammary or axillary lymph nodes.  Of course, the patient has a history of hepatic adenomas and these were noted incidentally on the MRI. Her definitive surgical treatment and subsequent history are as detailed below.  INTERVAL HISTORY: [name] returns today for follow up of her now recurrent breast cancer.  The start of treatment was delayed because of her new granddaughter, Festus Holts , who is already 9 pounds thanks 2 successful breast-feeding. Also because of the "[employer]" project, which [name] was bleeding, and which raised over $200,000 for breast  cancer  Patient care.   at the last visit here she reviewed all her supportive medications and she appropriately started Decadron yesterday. She walked 7 miles last weekend.   REVIEW OF SYSTEMS:  a detailed review of systems today was otherwise entirely negative  PAST MEDICAL HISTORY: Past Medical History  Diagnosis Date  . History of small bowel obstruction   . Liver masses   . Insomnia   . GERD (gastroesophageal reflux disease)   . Cancer [facility])     right breast  . Complication of anesthesia     hard to wake up  history of small-bowel obstruction with partial colectomy March 2001.  The patient has a history of hepatic adenomas.  She has had liver resections x2.  The data for this is at [facility] and we will try to obtain it.  She has also had incisional hernia repair.  She has a history of osteopenia, and she has a history of multiple anti-red cell antibodies making a transfusion difficult.  The patient tells me that she has a history of low calcium and a tendency to get liver injections.  PAST SURGICAL HISTORY: Past Surgical History  Procedure Laterality Date  . Liver surgery    . Laparotomies    . Hernia repair      incisional hernia repair x3  . Breast surgery Right   . Mastectomy w/ sentinel node biopsy Right 07/05/2015    Procedure: RIGHT MASTECTOMY WITH SENTINEL NODE BIOPSY AND PORT A CATH INSERTION;  Surgeon: [name], MD;  Location: [place];  Service: General;  Laterality: Right;  . Portacath placement Left 07/05/2015  Procedure: INSERTION PORT-A-CATH;  Surgeon: [name], MD;  Location: [place];  Service: General;  Laterality: Left;    FAMILY HISTORY Family History  Problem Relation Age of Onset  . Ovarian cancer Mother 20    "metastasized to breasts"  . Breast cancer Maternal Grandmother 48    2-3 recurrences  . Stomach cancer Maternal Grandfather     dx. 47-49; metastatic stomach cancer  . Colon polyps Father      about 2-3 polyps removed at each colonoscopy  . Colon polyps Brother     "a few"  . Other Maternal Aunt     +TAH  . Colon cancer Paternal Aunt 90  . Brain cancer Daughter 60    astrocytoma  . Breast cancer Cousin     dx. 47s  The patient's mother died from ovarian cancer at the age of 64.  The patient's father is alive at age 44.  She has 2 brothers, no sisters.  A maternal grandmother was diagnosed with breast cancer at the age of 64, and the maternal grandfather had stomach cancer at the age of 10.  GYNECOLOGIC HISTORY: The patient had menarche at age 59.  Her last menstrual period was in 2005.  She never used hormone replacement.  She used birth control briefly to control bleeding prior to 1980.  She was 59 years old at the birth of her first child.  She started having mammograms at age 53 because of a strong family history.  SOCIAL HISTORY: Neiva works as a Programmer, applications for [employer].   She also heads the local "[employer]" Her husband, Arcelia Pals, is a retired Engineer, maintenance from a [employer] locally.  Son, [name], lives in [place] and works for that [employer]  Daughter, [name], lives in [place] and is a homemaker.   ADVANCED DIRECTIVES: in place  HEALTH MAINTENANCE: Social History  Substance Use Topics  . Smoking status: Never Smoker   . Smokeless tobacco: Never Used  . Alcohol Use: 0.6 oz/week    1 Glasses of wine per week     Comment: social     Colonoscopy:  PAP:  Bone density:  Lipid panel:  Allergies  Allergen Reactions  . Promethazine Hcl Other (See Comments)    Causes shaking  . Tylenol [Acetaminophen]     Avoids Tylenol products due to liver disease   . Penicillins Rash    Current Outpatient Prescriptions  Medication Sig Dispense Refill  . b complex vitamins tablet Take 1 tablet by mouth daily.      . calcium-vitamin D (OSCAL WITH D) 500-200 MG-UNIT per tablet Take 2 tablets by mouth daily.     Marland Kitchen dexamethasone (DECADRON) 4 MG tablet Take 2 tablets (8 mg total) by mouth 2 (two) times daily. Start the day before Taxotere. Then again the day after chemo for 3 days. 30 tablet 1  . famotidine (PEPCID AC) 10 MG chewable tablet Chew 10 mg by mouth daily as needed for heartburn.    . fish oil-omega-3 fatty acids 1000 MG capsule Take 2 g by mouth daily.      Marland Kitchen glucosamine-chondroitin 500-400 MG tablet Take 1 tablet by mouth 3 (three) times daily.      Marland Kitchen ibuprofen (ADVIL,MOTRIN) 200 MG tablet Take 200 mg by mouth every 6 (six) hours as needed.    . lidocaine-prilocaine (EMLA) cream Apply to affected area once 30 g 3  . LORazepam (ATIVAN) 0.5 MG tablet  Take 1 tablet (0.5 mg total) by mouth at bedtime. 30 tablet 0  . Multiple Vitamins-Minerals (MULTIVITAMIN WITH MINERALS) tablet Take 1 tablet by mouth daily.      . ondansetron (ZOFRAN) 8 MG tablet Take 1 tablet (8 mg total) by mouth 2 (two) times daily. Start the day after chemo for 3 days. Then take as needed for nausea or vomiting. 30 tablet 1  . oxyCODONE (OXY IR/ROXICODONE) 5 MG immediate release tablet Take 1-2 tablets (5-10 mg total) by mouth every 4 (four) hours as needed for moderate pain. (Patient not taking: Reported on 07/18/2015) 40 tablet 0  . prochlorperazine (COMPAZINE) 10 MG tablet Take 1 tablet (10 mg total) by mouth every 6 (six) hours as needed (Nausea or vomiting). 30 tablet 1  . TURMERIC PO Take 400 mg by mouth daily. Pt takes 2 tablets 400 mg each= 800 mg total     No current facility-administered medications for this visit.    OBJECTIVE: Middle-aged white woman who appears younger than stated age  59 Vitals:   07/25/15 1008  BP: 127/64  Pulse: 75  Temp: 98 F (36.7 C)  Resp: 18     Body mass index is 21.88 kg/(m^2).    ECOG FS: 0  Sclerae unicteric, pupils round and equal Oropharynx clear and moist-- no thrush or other lesions No cervical or supraclavicular adenopathy Lungs no rales or rhonchi Heart regular rate  and rhythm Abd soft, nontender, positive bowel sounds MSK no focal spinal tenderness, no upper extremity lymphedema Neuro: nonfocal, well oriented, appropriate affect Breasts:  deferred    LAB RESULTS: Lab Results  Component Value Date   WBC 15.9* 07/25/2015   NEUTROABS 14.2* 07/25/2015   HGB 12.8 07/25/2015   HCT 39.4 07/25/2015   MCV 89.1 07/25/2015   PLT 266 07/25/2015      Chemistry      Component Value Date/Time   NA 139 07/25/2015 0955   NA 139 03/10/2013 0848   K 3.5 07/25/2015 0955   K 3.9 03/10/2013 0848   CL 102 03/15/2013 0912   CL 100 03/10/2013 0848   CO2 22 07/25/2015 0955   CO2 23 03/10/2013 0848   BUN 17.1 07/25/2015 0955   BUN 20 03/10/2013 0848   CREATININE 0.8 07/25/2015 0955   CREATININE 0.69 03/10/2013 0848      Component Value Date/Time   CALCIUM 9.8 07/25/2015 0955   CALCIUM 10.0 03/10/2013 0848   ALKPHOS 153* 07/25/2015 0955   ALKPHOS 131* 03/10/2013 0848   AST 15 07/25/2015 0955   AST 29 03/10/2013 0848   ALT 34 07/25/2015 0955   ALT 36* 03/10/2013 0848   BILITOT 0.36 07/25/2015 0955   BILITOT 0.3 03/10/2013 0848       Lab Results  Component Value Date   LABCA2 28 10/20/2012    No components found for: OLMBE675  No results for input(s): INR in the last 168 hours.  Urinalysis No results found for: COLORURINE  STUDIES: Nm Sentinel Node Inj-no Rpt (breast)  07/05/2015   CLINICAL DATA: right breast cancer   Sulfur colloid was injected intradermally by the nuclear medicine  technologist for breast cancer sentinel node localization.    Dg Chest Port 1 View  07/05/2015   CLINICAL DATA:  Port-A-Cath insertion  EXAM: PORTABLE CHEST - 1 VIEW  COMPARISON:  CT chest of 06/13/2015 and chest x-ray of 03/10/2013  FINDINGS: A left-sided Port-A-Cath is now present. The tip overlies the mid SVC. No pneumothorax is seen. The lungs  are clear. A surgical drain overlies the right chest. Surgical clips are noted in the right upper quadrant. Heart  size is stable  IMPRESSION: Left-sided Port-A-Cath tip overlies the mid SVC. No pneumothorax is seen.   Electronically Signed   By: Ivar Drape M.D.   On: 07/05/2015 11:03   Dg Fluoro Guide Cv Line-no Report  07/05/2015   CLINICAL DATA:    FLOURO GUIDE CV LINE  Fluoroscopy was utilized by the requesting physician.  No radiographic  interpretation.     ASSESSMENT: 58 y.o.  BRCA 1-2 and p-53 negative [place] woman status post right lumpectomy and sentinel lymph node sampling June 2012 for a T1a N0, Stage IA  invasive ductal carcinoma, grade 3, estrogen receptor 98% positive, progesterone receptor 97% positive, with an MIB-1 of 5% and no HER-2 amplification   (a) note that the initial Right breast biopsy showed DCIS w papillary architecture, estrogen receptor 70% positive, progesterone receptor negative  (1) completed radiation September 2012.   (2) on letrozole since July 2012   RECURRENT DISEASE: (3) right breast upper outer quadrant biopsy 05/23/2015 was positive for a clinical T1c N0, stage IA invasive ductal carcinoma, grade 2, estrogen receptor 60% positive with moderate staining intensity, progesterone receptor negative, with no HER-2 ossification and an MIB-1 of 90%.  (4) status post right simple mastectomy 07/05/2015 for a pT1c pN0, stage IA invasive ductal carcinoma, grade 3, with negative margins. 4 lymph nodes removed with mastectomy were benign.  (5) to begin cyclophosphamide and docetaxel q21 days x 4, with neulasta onpro given on day 2 for granulocyte support  (6) genetics testing 07/20/2015, results pending  (7) RUL spiculated nodule measuring 1.2 cm noted on staging CT scan 06/13/2015-- repeat chest CT pending   PLAN:  [name]  Is ready and eager to get on with her chemotherapy. She has a good understanding of how to take her antinausea and other supportive metastases and we reviewed that today. Incidentally her glucose today is a bit on the high side. She will let us know if  she develops polyuria/polydipsia.  We also discussed the fact that we have not repeated her chest CT , which showed a her small nodule in her right upper lobe.  ifwe treat her today and obtain a CT scan in 2 weeks and the nodule has disappeared then perhaps he was the treatment that did that and not the fact that he was a transient finding.  For that reason after much discussion we decided to do a CT of the chest today and move her chemotherapy to tomorrow. She will not need to repeat the entire premed but she will take dexamethasone again this evening. Of course she will use Ativan tonight again otherwise she likely will not get much sleep.    we are accessing her power port so you can be used for the test today, and she can keep it accessed overnight so she does not have to be reaction as tomorrow for treatment.  She has already arranged to have her hair "buzzed off" in about 2 or 2 and half weeks and she will be looking 4 weeks with a close friend who has "contacts" with a wick store in [facility]. Otherwise the plan remains for 4 cycles of cyclophosphamide and docetaxel, given 3 weeks apart, with [place] will call with any problems that may develop before her next visit here, which will be a week from today.  MAGRINAT,GUSTAV C    07/25/2015  ADDENDUM: Ct shows the RUL spiculated mass to be unchanged; there are no new masses. I called the patient and left her message with the information.

## 2015-07-25 NOTE — Progress Notes (Signed)
Patient's port accessed today in clinic.  She is going to radiology for a CT scan today at 1300 and will have chemotherapy tomorrow morning at 8 am.  Spoke with radiology to let them know to leave the port accessed.

## 2015-07-26 ENCOUNTER — Ambulatory Visit (HOSPITAL_BASED_OUTPATIENT_CLINIC_OR_DEPARTMENT_OTHER): Payer: BLUE CROSS/BLUE SHIELD

## 2015-07-26 VITALS — BP 133/69 | HR 61 | Temp 98.1°F | Resp 18

## 2015-07-26 DIAGNOSIS — Z5111 Encounter for antineoplastic chemotherapy: Secondary | ICD-10-CM | POA: Diagnosis not present

## 2015-07-26 DIAGNOSIS — C50411 Malignant neoplasm of upper-outer quadrant of right female breast: Secondary | ICD-10-CM

## 2015-07-26 DIAGNOSIS — C50911 Malignant neoplasm of unspecified site of right female breast: Secondary | ICD-10-CM

## 2015-07-26 MED ORDER — SODIUM CHLORIDE 0.9 % IV SOLN
Freq: Once | INTRAVENOUS | Status: AC
  Administered 2015-07-26: 09:00:00 via INTRAVENOUS
  Filled 2015-07-26: qty 8

## 2015-07-26 MED ORDER — HEPARIN SOD (PORK) LOCK FLUSH 100 UNIT/ML IV SOLN
500.0000 [IU] | Freq: Once | INTRAVENOUS | Status: AC | PRN
  Administered 2015-07-26: 500 [IU]
  Filled 2015-07-26: qty 5

## 2015-07-26 MED ORDER — SODIUM CHLORIDE 0.9 % IV SOLN
Freq: Once | INTRAVENOUS | Status: AC
  Administered 2015-07-26: 08:00:00 via INTRAVENOUS

## 2015-07-26 MED ORDER — PEGFILGRASTIM 6 MG/0.6ML ~~LOC~~ PSKT
6.0000 mg | PREFILLED_SYRINGE | Freq: Once | SUBCUTANEOUS | Status: AC
  Administered 2015-07-26: 6 mg via SUBCUTANEOUS
  Filled 2015-07-26: qty 0.6

## 2015-07-26 MED ORDER — SODIUM CHLORIDE 0.9 % IJ SOLN
10.0000 mL | INTRAMUSCULAR | Status: DC | PRN
Start: 2015-07-26 — End: 2015-07-26
  Administered 2015-07-26: 10 mL
  Filled 2015-07-26: qty 10

## 2015-07-26 MED ORDER — DOCETAXEL CHEMO INJECTION 160 MG/16ML
75.0000 mg/m2 | Freq: Once | INTRAVENOUS | Status: AC
  Administered 2015-07-26: 130 mg via INTRAVENOUS
  Filled 2015-07-26: qty 13

## 2015-07-26 MED ORDER — SODIUM CHLORIDE 0.9 % IV SOLN
600.0000 mg/m2 | Freq: Once | INTRAVENOUS | Status: AC
  Administered 2015-07-26: 1020 mg via INTRAVENOUS
  Filled 2015-07-26: qty 51

## 2015-07-26 NOTE — Progress Notes (Signed)
WBC 15.9 and ANC 14.2 Reviewed labs with Dr. Jana Hakim, pt is to receive Neulasta On-Pro today per Dr. Jana Hakim.

## 2015-07-26 NOTE — Patient Instructions (Signed)
[facility] Discharge Instructions for Patients Receiving Chemotherapy  Today you received the following chemotherapy agents: Taxotere and Cytoxan   To help prevent nausea and vomiting after your treatment, we encourage you to take your nausea medication as directed.    If you develop nausea and vomiting that is not controlled by your nausea medication, call the clinic.   BELOW ARE SYMPTOMS THAT SHOULD BE REPORTED IMMEDIATELY:  *FEVER GREATER THAN 100.5 F  *CHILLS WITH OR WITHOUT FEVER  NAUSEA AND VOMITING THAT IS NOT CONTROLLED WITH YOUR NAUSEA MEDICATION  *UNUSUAL SHORTNESS OF BREATH  *UNUSUAL BRUISING OR BLEEDING  TENDERNESS IN MOUTH AND THROAT WITH OR WITHOUT PRESENCE OF ULCERS  *URINARY PROBLEMS  *BOWEL PROBLEMS  UNUSUAL RASH Items with * indicate a potential emergency and should be followed up as soon as possible.  Feel free to call the clinic you have any questions or concerns. The clinic phone number is [phone].  Please show the [facility] at check-in to the Emergency Department and triage nurse.   Docetaxel injection What is this medicine? DOCETAXEL (doe se TAX el) is a chemotherapy drug. It targets fast dividing cells, like cancer cells, and causes these cells to die. This medicine is used to treat many types of cancers like breast cancer, certain stomach cancers, head and neck cancer, lung cancer, and prostate cancer. This medicine may be used for other purposes; ask your health care provider or pharmacist if you have questions. What should I tell my health care provider before I take this medicine? They need to know if you have any of these conditions: -infection (especially a virus infection such as chickenpox, cold sores, or herpes) -liver disease -low blood counts, like low white cell, platelet, or red cell counts -an unusual or allergic reaction to docetaxel, polysorbate 80, other chemotherapy agents, other medicines, foods, dyes,  or preservatives -pregnant or trying to get pregnant -breast-feeding How should I use this medicine? This drug is given as an infusion into a vein. It is administered in a hospital or clinic by a specially trained health care professional. Talk to your pediatrician regarding the use of this medicine in children. Special care may be needed. Overdosage: If you think you have taken too much of this medicine contact a poison control center or emergency room at once. NOTE: This medicine is only for you. Do not share this medicine with others. What if I miss a dose? It is important not to miss your dose. Call your doctor or health care professional if you are unable to keep an appointment. What may interact with this medicine? -cyclosporine -erythromycin -ketoconazole -medicines to increase blood counts like filgrastim, pegfilgrastim, sargramostim -vaccines Talk to your doctor or health care professional before taking any of these medicines: -acetaminophen -aspirin -ibuprofen -ketoprofen -naproxen This list may not describe all possible interactions. Give your health care provider a list of all the medicines, herbs, non-prescription drugs, or dietary supplements you use. Also tell them if you smoke, drink alcohol, or use illegal drugs. Some items may interact with your medicine. What should I watch for while using this medicine? Your condition will be monitored carefully while you are receiving this medicine. You will need important blood work done while you are taking this medicine. This drug may make you feel generally unwell. This is not uncommon, as chemotherapy can affect healthy cells as well as cancer cells. Report any side effects. Continue your course of treatment even though you feel ill unless your  doctor tells you to stop. In some cases, you may be given additional medicines to help with side effects. Follow all directions for their use. Call your doctor or health care professional  for advice if you get a fever, chills or sore throat, or other symptoms of a cold or flu. Do not treat yourself. This drug decreases your body's ability to fight infections. Try to avoid being around people who are sick. This medicine may increase your risk to bruise or bleed. Call your doctor or health care professional if you notice any unusual bleeding. This medicine may contain alcohol in the product. You may get drowsy or dizzy. Do not drive, use machinery, or do anything that needs mental alertness until you know how this medicine affects you. Do not stand or sit up quickly, especially if you are an older patient. This reduces the risk of dizzy or fainting spells. Avoid alcoholic drinks. Do not become pregnant while taking this medicine. Women should inform their doctor if they wish to become pregnant or think they might be pregnant. There is a potential for serious side effects to an unborn child. Talk to your health care professional or pharmacist for more information. Do not breast-feed an infant while taking this medicine. What side effects may I notice from receiving this medicine? Side effects that you should report to your doctor or health care professional as soon as possible: -allergic reactions like skin rash, itching or hives, swelling of the face, lips, or tongue -low blood counts - This drug may decrease the number of white blood cells, red blood cells and platelets. You may be at increased risk for infections and bleeding. -signs of infection - fever or chills, cough, sore throat, pain or difficulty passing urine -signs of decreased platelets or bleeding - bruising, pinpoint red spots on the skin, black, tarry stools, nosebleeds -signs of decreased red blood cells - unusually weak or tired, fainting spells, lightheadedness -breathing problems -fast or irregular heartbeat -low blood pressure -mouth sores -nausea and vomiting -pain, swelling, redness or irritation at the injection  site -pain, tingling, numbness in the hands or feet -swelling of the ankle, feet, hands -weight gain Side effects that usually do not require medical attention (report to your prescriber or health care professional if they continue or are bothersome): -bone pain -complete hair loss including hair on your head, underarms, pubic hair, eyebrows, and eyelashes -diarrhea -excessive tearing -changes in the color of fingernails -loosening of the fingernails -nausea -muscle pain -red flush to skin -sweating -weak or tired This list may not describe all possible side effects. Call your doctor for medical advice about side effects. You may report side effects to FDA at 1-800-FDA-1088. Where should I keep my medicine? This drug is given in a hospital or clinic and will not be stored at home. NOTE: This sheet is a summary. It may not cover all possible information. If you have questions about this medicine, talk to your doctor, pharmacist, or health care provider.    2016, [employer]. (2014-10-17 16:04:57)   Cyclophosphamide injection What is this medicine? CYCLOPHOSPHAMIDE (sye kloe FOSS fa mide) is a chemotherapy drug. It slows the growth of cancer cells. This medicine is used to treat many types of cancer like lymphoma, myeloma, leukemia, breast cancer, and ovarian cancer, to name a few. This medicine may be used for other purposes; ask your health care provider or pharmacist if you have questions. What should I tell my health care provider before I take this  medicine? They need to know if you have any of these conditions: -blood disorders -history of other chemotherapy -infection -kidney disease -liver disease -recent or ongoing radiation therapy -tumors in the bone marrow -an unusual or allergic reaction to cyclophosphamide, other chemotherapy, other medicines, foods, dyes, or preservatives -pregnant or trying to get pregnant -breast-feeding How should I use this  medicine? This drug is usually given as an injection into a vein or muscle or by infusion into a vein. It is administered in a hospital or clinic by a specially trained health care professional. Talk to your pediatrician regarding the use of this medicine in children. Special care may be needed. Overdosage: If you think you have taken too much of this medicine contact a poison control center or emergency room at once. NOTE: This medicine is only for you. Do not share this medicine with others. What if I miss a dose? It is important not to miss your dose. Call your doctor or health care professional if you are unable to keep an appointment. What may interact with this medicine? This medicine may interact with the following medications: -amiodarone -amphotericin B -azathioprine -certain antiviral medicines for HIV or AIDS such as protease inhibitors (e.g., indinavir, ritonavir) and zidovudine -certain blood pressure medications such as benazepril, captopril, enalapril, fosinopril, lisinopril, moexipril, monopril, perindopril, quinapril, ramipril, trandolapril -certain cancer medications such as anthracyclines (e.g., daunorubicin, doxorubicin), busulfan, cytarabine, paclitaxel, pentostatin, tamoxifen, trastuzumab -certain diuretics such as chlorothiazide, chlorthalidone, hydrochlorothiazide, indapamide, metolazone -certain medicines that treat or prevent blood clots like warfarin -certain muscle relaxants such as succinylcholine -cyclosporine -etanercept -indomethacin -medicines to increase blood counts like filgrastim, pegfilgrastim, sargramostim -medicines used as general anesthesia -metronidazole -natalizumab This list may not describe all possible interactions. Give your health care provider a list of all the medicines, herbs, non-prescription drugs, or dietary supplements you use. Also tell them if you smoke, drink alcohol, or use illegal drugs. Some items may interact with your  medicine. What should I watch for while using this medicine? Visit your doctor for checks on your progress. This drug may make you feel generally unwell. This is not uncommon, as chemotherapy can affect healthy cells as well as cancer cells. Report any side effects. Continue your course of treatment even though you feel ill unless your doctor tells you to stop. Drink water or other fluids as directed. Urinate often, even at night. In some cases, you may be given additional medicines to help with side effects. Follow all directions for their use. Call your doctor or health care professional for advice if you get a fever, chills or sore throat, or other symptoms of a cold or flu. Do not treat yourself. This drug decreases your body's ability to fight infections. Try to avoid being around people who are sick. This medicine may increase your risk to bruise or bleed. Call your doctor or health care professional if you notice any unusual bleeding. Be careful brushing and flossing your teeth or using a toothpick because you may get an infection or bleed more easily. If you have any dental work done, tell your dentist you are receiving this medicine. You may get drowsy or dizzy. Do not drive, use machinery, or do anything that needs mental alertness until you know how this medicine affects you. Do not become pregnant while taking this medicine or for 1 year after stopping it. Women should inform their doctor if they wish to become pregnant or think they might be pregnant. Men should not father a child while  taking this medicine and for 4 months after stopping it. There is a potential for serious side effects to an unborn child. Talk to your health care professional or pharmacist for more information. Do not breast-feed an infant while taking this medicine. This medicine may interfere with the ability to have a child. This medicine has caused ovarian failure in some women. This medicine has caused reduced sperm  counts in some men. You should talk with your doctor or health care professional if you are concerned about your fertility. If you are going to have surgery, tell your doctor or health care professional that you have taken this medicine. What side effects may I notice from receiving this medicine? Side effects that you should report to your doctor or health care professional as soon as possible: -allergic reactions like skin rash, itching or hives, swelling of the face, lips, or tongue -low blood counts - this medicine may decrease the number of white blood cells, red blood cells and platelets. You may be at increased risk for infections and bleeding. -signs of infection - fever or chills, cough, sore throat, pain or difficulty passing urine -signs of decreased platelets or bleeding - bruising, pinpoint red spots on the skin, black, tarry stools, blood in the urine -signs of decreased red blood cells - unusually weak or tired, fainting spells, lightheadedness -breathing problems -dark urine -dizziness -palpitations -swelling of the ankles, feet, hands -trouble passing urine or change in the amount of urine -weight gain -yellowing of the eyes or skin Side effects that usually do not require medical attention (report to your doctor or health care professional if they continue or are bothersome): -changes in nail or skin color -hair loss -missed menstrual periods -mouth sores -nausea, vomiting This list may not describe all possible side effects. Call your doctor for medical advice about side effects. You may report side effects to FDA at 1-800-FDA-1088. Where should I keep my medicine? This drug is given in a hospital or clinic and will not be stored at home. NOTE: This sheet is a summary. It may not cover all possible information. If you have questions about this medicine, talk to your doctor, pharmacist, or health care provider.    2016, [employer]. (2012-08-14  16:22:58)

## 2015-07-31 ENCOUNTER — Other Ambulatory Visit: Payer: Self-pay | Admitting: *Deleted

## 2015-07-31 DIAGNOSIS — C50911 Malignant neoplasm of unspecified site of right female breast: Secondary | ICD-10-CM

## 2015-08-01 ENCOUNTER — Other Ambulatory Visit (HOSPITAL_BASED_OUTPATIENT_CLINIC_OR_DEPARTMENT_OTHER): Payer: BLUE CROSS/BLUE SHIELD

## 2015-08-01 ENCOUNTER — Encounter: Payer: Self-pay | Admitting: Nurse Practitioner

## 2015-08-01 ENCOUNTER — Ambulatory Visit (HOSPITAL_BASED_OUTPATIENT_CLINIC_OR_DEPARTMENT_OTHER): Payer: BLUE CROSS/BLUE SHIELD | Admitting: Nurse Practitioner

## 2015-08-01 ENCOUNTER — Telehealth: Payer: Self-pay | Admitting: Oncology

## 2015-08-01 ENCOUNTER — Encounter: Payer: Self-pay | Admitting: *Deleted

## 2015-08-01 VITALS — BP 122/68 | HR 79 | Temp 98.6°F | Resp 18 | Ht 66.0 in | Wt 133.4 lb

## 2015-08-01 DIAGNOSIS — C50411 Malignant neoplasm of upper-outer quadrant of right female breast: Secondary | ICD-10-CM

## 2015-08-01 DIAGNOSIS — M898X9 Other specified disorders of bone, unspecified site: Secondary | ICD-10-CM | POA: Diagnosis not present

## 2015-08-01 DIAGNOSIS — R911 Solitary pulmonary nodule: Secondary | ICD-10-CM | POA: Diagnosis not present

## 2015-08-01 DIAGNOSIS — C50911 Malignant neoplasm of unspecified site of right female breast: Secondary | ICD-10-CM

## 2015-08-01 DIAGNOSIS — K209 Esophagitis, unspecified: Secondary | ICD-10-CM

## 2015-08-01 LAB — CBC WITH DIFFERENTIAL/PLATELET
BASO%: 0.7 % (ref 0.0–2.0)
Basophils Absolute: 0.1 10*3/uL (ref 0.0–0.1)
EOS%: 0.4 % (ref 0.0–7.0)
Eosinophils Absolute: 0.1 10*3/uL (ref 0.0–0.5)
HEMATOCRIT: 40.1 % (ref 34.8–46.6)
HGB: 13.2 g/dL (ref 11.6–15.9)
LYMPH#: 1.6 10*3/uL (ref 0.9–3.3)
LYMPH%: 13 % — ABNORMAL LOW (ref 14.0–49.7)
MCH: 28.9 pg (ref 25.1–34.0)
MCHC: 33 g/dL (ref 31.5–36.0)
MCV: 87.6 fL (ref 79.5–101.0)
MONO#: 1.2 10*3/uL — ABNORMAL HIGH (ref 0.1–0.9)
MONO%: 9.4 % (ref 0.0–14.0)
NEUT#: 9.5 10*3/uL — ABNORMAL HIGH (ref 1.5–6.5)
NEUT%: 76.5 % (ref 38.4–76.8)
Platelets: 193 10*3/uL (ref 145–400)
RBC: 4.58 10*6/uL (ref 3.70–5.45)
RDW: 13.2 % (ref 11.2–14.5)
WBC: 12.4 10*3/uL — ABNORMAL HIGH (ref 3.9–10.3)

## 2015-08-01 LAB — COMPREHENSIVE METABOLIC PANEL (CC13)
ALBUMIN: 3.5 g/dL (ref 3.5–5.0)
ALK PHOS: 152 U/L — AB (ref 40–150)
ALT: 36 U/L (ref 0–55)
ANION GAP: 8 meq/L (ref 3–11)
AST: 20 U/L (ref 5–34)
BUN: 13.6 mg/dL (ref 7.0–26.0)
CALCIUM: 9 mg/dL (ref 8.4–10.4)
CO2: 25 mEq/L (ref 22–29)
Chloride: 102 mEq/L (ref 98–109)
Creatinine: 0.8 mg/dL (ref 0.6–1.1)
EGFR: 86 mL/min/{1.73_m2} — AB (ref >90)
Glucose: 126 mg/dl (ref 70–140)
POTASSIUM: 4.6 meq/L (ref 3.5–5.1)
SODIUM: 134 meq/L — AB (ref 136–145)
Total Bilirubin: 0.32 mg/dL (ref 0.20–1.20)
Total Protein: 6.4 g/dL (ref 6.4–8.3)

## 2015-08-01 MED ORDER — TRAMADOL HCL 50 MG PO TABS: 50.0000 mg | ORAL_TABLET | Freq: Four times a day (QID) | ORAL | Status: DC | PRN

## 2015-08-01 MED ORDER — TOBRAMYCIN-DEXAMETHASONE 0.3-0.1 % OP SUSP: 1.0000 [drp] | Freq: Two times a day (BID) | OPHTHALMIC | Status: DC

## 2015-08-01 NOTE — Telephone Encounter (Signed)
Gave patient avs report and appointments for October thru December.

## 2015-08-01 NOTE — Progress Notes (Signed)
ID: Crystal [name]   DOB: [birth year]  MR#: [id number]  CSN#:[id number]  PCP: Kandice Hams, MD GYN: SU: [name] MD OTHER MD: Earle Gell, Daneen Schick, Arnoldo Hooker Thimmappa]  CHIEF COMPLAINT: recurrent estrogen receptor positive breast cancer  CURRENT TREATMENT: Awaiting definitive surgery  BREAST CANCER HISTORY:   From the original intake note:  The patient had bilateral diagnostic mammography 02/12/2011.  She has a history of cystic changes noted on prior studies in April 2011 and April 2010.  On the Feb 12, 2011 study there were new calcifications identified superiorly in the right breast, without any obvious mass.  The patient was brought back for further studies, 02/19/2011 and Dr. Marcelo Baldy was able to localize the calcifications under stereotactic guidance.  The biopsy ([id number]) showed high-grade ductal carcinoma with papillary features which was 70% estrogen receptor positive, 0% progesterone receptor positive.  There was no definitive invasion and therefore further studies were not performed.   Bilateral breast MRIs were obtained Feb 26, 2011.  This showed marked nodular enhancement throughout both breasts, and in the upper central portion there was a post biopsy area of change measuring up to 3.7 cm.  Most of this is a fluid cavity with a thin rim of enhancement.  There were no suspicious areas in the left breast.  No internal mammary or axillary lymph nodes.  Of course, the patient has a history of hepatic adenomas and these were noted incidentally on the MRI. Her definitive surgical treatment and subsequent history are as detailed below.  INTERVAL HISTORY: Crystal [name] returns today for follow up of her now recurrent breast cancer. Today is day 7, cycle 1 of cyclophosphamide and docetaxel, with neulasta onpro given on day 2 for granulocyte support.  REVIEW OF SYSTEMS: Purva is having a rough week. The bone pain from the neulata was particularly intense with her, despite using  claritin as directed. She has been on ibuprofen every 4 hours, and still rates her pain at 6/10. She does not want to use tylenol because of her history of hepatic adenomas. She denies fevers, chills, nausea, or vomiting. Her bowels have been loose, but no diarrhea. She complains of burning to her throat. She denies mouth sores, rashes, or neuropathy symptoms. Her eyes are burning off and on. She slept well using ativan PRN. She has some pain to her jaw. A detailed review of system is otherwise stable.  PAST MEDICAL HISTORY: Past Medical History  Diagnosis Date  . History of small bowel obstruction   . Liver masses   . Insomnia   . GERD (gastroesophageal reflux disease)   . Complication of anesthesia     hard to wake up  . Cancer [facility])     right breast  history of small-bowel obstruction with partial colectomy March 2001.  The patient has a history of hepatic adenomas.  She has had liver resections x2.  The data for this is at [facility] and we will try to obtain it.  She has also had incisional hernia repair.  She has a history of osteopenia, and she has a history of multiple anti-red cell antibodies making a transfusion difficult.  The patient tells me that she has a history of low calcium and a tendency to get liver injections.  PAST SURGICAL HISTORY: Past Surgical History  Procedure Laterality Date  . Liver surgery    . Laparotomies    . Hernia repair      incisional hernia repair x3  . Breast surgery Right   . Mastectomy  w/ sentinel node biopsy Right 07/05/2015    Procedure: RIGHT MASTECTOMY WITH SENTINEL NODE BIOPSY AND PORT A CATH INSERTION;  Surgeon: [name], MD;  Location: [place];  Service: General;  Laterality: Right;  . Portacath placement Left 07/05/2015    Procedure: INSERTION PORT-A-CATH;  Surgeon: [name], MD;  Location: [place];  Service: General;  Laterality: Left;    FAMILY HISTORY Family History  Problem Relation Age of  Onset  . Ovarian cancer Mother 46    "metastasized to breasts"  . Breast cancer Maternal Grandmother 48    2-3 recurrences  . Stomach cancer Maternal Grandfather     dx. 47-49; metastatic stomach cancer  . Colon polyps Father     about 2-3 polyps removed at each colonoscopy  . Colon polyps Brother     "a few"  . Other Maternal Aunt     +TAH  . Colon cancer Paternal Aunt 90  . Brain cancer Daughter 17    astrocytoma  . Breast cancer Cousin     dx. 57s  The patient's mother died from ovarian cancer at the age of 86.  The patient's father is alive at age 2.  She has 2 brothers, no sisters.  A maternal grandmother was diagnosed with breast cancer at the age of 39, and the maternal grandfather had stomach cancer at the age of 15.  GYNECOLOGIC HISTORY: The patient had menarche at age 7.  Her [name] menstrual period was in 2005.  She never used hormone replacement.  She used birth control briefly to control bleeding prior to 1980.  She was 59 years old at the birth of her first child.  She started having mammograms at age 59 because of a strong family history.  SOCIAL HISTORY: Birdie works as a Programmer, applications for [employer].   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  Son, Crystal [name], lives in [place] and works for that [employer]  Daughter, Crystal [name], lives in [place] and is a homemaker.   ADVANCED DIRECTIVES: in place  HEALTH MAINTENANCE: Social History  Substance Use Topics  . Smoking status: Never Smoker   . Smokeless tobacco: Never Used  . Alcohol Use: 0.6 oz/week    1 Glasses of wine per week     Comment: social     Colonoscopy:  PAP:  Bone density:  Lipid panel:  Allergies  Allergen Reactions  . Promethazine Hcl Other (See Comments)    Causes shaking  . Tylenol [Acetaminophen]     Avoids Tylenol products due to liver disease   . Penicillins  Rash    Current Outpatient Prescriptions  Medication Sig Dispense Refill  . b complex vitamins tablet Take 1 tablet by mouth daily.      . calcium-vitamin D (OSCAL WITH D) 500-200 MG-UNIT per tablet Take 2 tablets by mouth daily.    Marland Kitchen dexamethasone (DECADRON) 4 MG tablet Take 2 tablets (8 mg total) by mouth 2 (two) times daily. Start the day before Taxotere. Then again the day after chemo for 3 days. 30 tablet 1  . famotidine (PEPCID AC) 10 MG chewable tablet Chew 10 mg by mouth daily as needed for heartburn.    . fish oil-omega-3 fatty acids 1000 MG capsule Take 2 g by mouth daily.      Marland Kitchen glucosamine-chondroitin 500-400 MG tablet Take 1 tablet by mouth 3 (  three) times daily.      Marland Kitchen lidocaine-prilocaine (EMLA) cream Apply to affected area once 30 g 3  . LORazepam (ATIVAN) 0.5 MG tablet Take 1 tablet (0.5 mg total) by mouth at bedtime. 30 tablet 0  . Multiple Vitamins-Minerals (MULTIVITAMIN WITH MINERALS) tablet Take 1 tablet by mouth daily.      . ondansetron (ZOFRAN) 8 MG tablet Take 1 tablet (8 mg total) by mouth 2 (two) times daily. Start the day after chemo for 3 days. Then take as needed for nausea or vomiting. 30 tablet 1  . prochlorperazine (COMPAZINE) 10 MG tablet Take 1 tablet (10 mg total) by mouth every 6 (six) hours as needed (Nausea or vomiting). 30 tablet 1  . TURMERIC PO Take 400 mg by mouth daily. Pt takes 2 tablets 400 mg each= 800 mg total    . ibuprofen (ADVIL,MOTRIN) 200 MG tablet Take 200 mg by mouth every 6 (six) hours as needed.    Marland Kitchen oxyCODONE (OXY IR/ROXICODONE) 5 MG immediate release tablet Take 1-2 tablets (5-10 mg total) by mouth every 4 (four) hours as needed for moderate pain. (Patient not taking: Reported on 07/18/2015) 40 tablet 0  . tobramycin-dexamethasone (TOBRADEX) ophthalmic solution Place 1 drop into both eyes 2 (two) times daily. 5 mL 1  . traMADol (ULTRAM) 50 MG tablet Take 1 tablet (50 mg total) by mouth every 6 (six) hours as needed. 60 tablet 2   No  current facility-administered medications for this visit.    OBJECTIVE: Middle-aged white woman who appears younger than stated age  59 Vitals:   08/01/15 1255  BP: 122/68  Pulse: 79  Temp: 98.6 F (37 C)  Resp: 18     Body mass index is 21.54 kg/(m^2).    ECOG FS: 0  Skin: warm, dry  HEENT: sclerae anicteric, conjunctivae pink, oropharynx clear. No thrush or mucositis.  Lymph Nodes: No cervical or supraclavicular lymphadenopathy  Lungs: clear to auscultation bilaterally, no rales, wheezes, or rhonci  Heart: regular rate and rhythm  Abdomen: round, soft, non tender, positive bowel sounds  Musculoskeletal: No focal spinal tenderness, no peripheral edema  Neuro: non focal, well oriented, positive affect  Breasts: deferred  LAB RESULTS: Lab Results  Component Value Date   WBC 12.4* 08/01/2015   NEUTROABS 9.5* 08/01/2015   HGB 13.2 08/01/2015   HCT 40.1 08/01/2015   MCV 87.6 08/01/2015   PLT 193 08/01/2015      Chemistry      Component Value Date/Time   NA 134* 08/01/2015 1220   NA 139 03/10/2013 0848   K 4.6 08/01/2015 1220   K 3.9 03/10/2013 0848   CL 102 03/15/2013 0912   CL 100 03/10/2013 0848   CO2 25 08/01/2015 1220   CO2 23 03/10/2013 0848   BUN 13.6 08/01/2015 1220   BUN 20 03/10/2013 0848   CREATININE 0.8 08/01/2015 1220   CREATININE 0.69 03/10/2013 0848      Component Value Date/Time   CALCIUM 9.0 08/01/2015 1220   CALCIUM 10.0 03/10/2013 0848   ALKPHOS 152* 08/01/2015 1220   ALKPHOS 131* 03/10/2013 0848   AST 20 08/01/2015 1220   AST 29 03/10/2013 0848   ALT 36 08/01/2015 1220   ALT 36* 03/10/2013 0848   BILITOT 0.32 08/01/2015 1220   BILITOT 0.3 03/10/2013 0848       Lab Results  Component Value Date   LABCA2 28 10/20/2012    No components found for: [id number]  No results for input(s): INR in  the [name] 168 hours.  Urinalysis No results found for: COLORURINE  STUDIES: Ct Chest W Contrast  07/25/2015  CLINICAL DATA:  Subsequent  treatment strategy for breast carcinoma.Breast carcinoma diagnosis 2012 and 2016. Recurrent RIGHT breastcance EXAM: CT CHEST WITH CONTRAST TECHNIQUE: Multidetector CT imaging of the chest was performed during intravenous contrast administration. CONTRAST:  49m OMNIPAQUE IOHEXOL 300 MG/ML  SOLN COMPARISON:  CT 06/13/2015, MRI 03/04/2011 FINDINGS: Mediastinum/Nodes: Postsurgical change consistent with RIGHT mastectomy. There stranding in the RIGHT axilla related to nodal dissection. No LEFT axillary adenopathy. No supraclavicular adenopathy. No internal mammary adenopathy. No mediastinal hilar adenopathy.  No pericardial fluid. Lungs/Pleura: Spiculated nodule in the RIGHT upper lobe measures 10 x 14 x 14 mm compared to 11 x 13 x 14 mm on prior remeasured. No interval change. No new pulmonary nodules. Upper abdomen: Limited view of the upper abdomen demonstrates multiple lesions within the liver. These are described as benign adenomas on MRI in 2012 and are not changed from recent CT. adrenal glands are normal. There are surgical clips along the RIGHT hepatic lobe. Musculoskeletal: No aggressive osseous lesion. IMPRESSION: 1. Stable spiculated nodule in the RIGHT upper lobe over 2 month interval. Recommend CT follow-up versus FDG PET scan. 2. Interval RIGHT mastectomy. 3. Stable lesions within liver compared with MRI of 2012. Electronically Signed   By: SSuzy BouchardM.D.   On: 07/25/2015 17:25   Nm Sentinel Node Inj-no Rpt (breast)  07/05/2015  CLINICAL DATA: right breast cancer Sulfur colloid was injected intradermally by the nuclear medicine technologist for breast cancer sentinel node localization.   Dg Chest Port 1 View  07/05/2015  CLINICAL DATA:  Port-A-Cath insertion EXAM: PORTABLE CHEST - 1 VIEW COMPARISON:  CT chest of 06/13/2015 and chest x-ray of 03/10/2013 FINDINGS: A left-sided Port-A-Cath is now present. The tip overlies the mid SVC. No pneumothorax is seen. The lungs are clear. A surgical  drain overlies the right chest. Surgical clips are noted in the right upper quadrant. Heart size is stable IMPRESSION: Left-sided Port-A-Cath tip overlies the mid SVC. No pneumothorax is seen. Electronically Signed   By: PIvar DrapeM.D.   On: 07/05/2015 11:03   Dg Fluoro Guide Cv Line-no Report  07/05/2015  CLINICAL DATA:  FLOURO GUIDE CV LINE Fluoroscopy was utilized by the requesting physician.  No radiographic interpretation.    ASSESSMENT: 59y.o.  BRCA 1-2 and p-53 negative  woman status post right lumpectomy and sentinel lymph node sampling June 2012 for a T1a N0, Stage IA  invasive ductal carcinoma, grade 3, estrogen receptor 98% positive, progesterone receptor 97% positive, with an MIB-1 of 5% and no HER-2 amplification   (a) note that the initial Right breast biopsy showed DCIS w papillary architecture, estrogen receptor 70% positive, progesterone receptor negative  (1) completed radiation September 2012.   (2) on letrozole since July 2012   RECURRENT DISEASE: (3) right breast upper outer quadrant biopsy 05/23/2015 was positive for a clinical T1c N0, stage IA invasive ductal carcinoma, grade 2, estrogen receptor 60% positive with moderate staining intensity, progesterone receptor negative, with no HER-2 ossification and an MIB-1 of 90%.  (4) status post right simple mastectomy 07/05/2015 for a pT1c pN0, stage IA invasive ductal carcinoma, grade 3, with negative margins. 4 lymph nodes removed with mastectomy were benign.  (5) to begin cyclophosphamide and docetaxel q21 days x 4, with neulasta onpro given on day 2 for granulocyte support  (6) genetics testing 07/20/2015, results pending  (7) RUL spiculated nodule measuring  1.2 cm noted on staging CT scan 06/13/2015-- stable on repeat CT 07/25/15  PLAN:  Crystal [name] had some difficulty with her first cycle to treatment, mainly related to bone pain with neulasta. Given her liver condition, she would like to avoid tylenol use. I  have written her for a prescription for tramadol instead, to alternate with ibuprofen. I have also sent in a prescription for tobradex for her eyes. I offered a prescription for omeprazole for her esophagitis and she declined. She wishes to try tums first.   The labs were reviewed in detail and were stable.   I have given her a prescription for a wig.  Crystal [name] will return in 2 weeks for cycle 2 of cyclophosphamide and docetaxel. She understands and agrees with this plan. She has been encouraged to call with any issues that might arise before her next visit here.   Genelle Gather Boelter    08/01/2015

## 2015-08-07 ENCOUNTER — Encounter: Payer: Self-pay | Admitting: Physical Therapy

## 2015-08-07 ENCOUNTER — Ambulatory Visit: Payer: BLUE CROSS/BLUE SHIELD | Attending: Oncology | Admitting: Physical Therapy

## 2015-08-07 DIAGNOSIS — M25611 Stiffness of right shoulder, not elsewhere classified: Secondary | ICD-10-CM | POA: Diagnosis present

## 2015-08-07 DIAGNOSIS — Z9189 Other specified personal risk factors, not elsewhere classified: Secondary | ICD-10-CM | POA: Diagnosis present

## 2015-08-07 DIAGNOSIS — Z9011 Acquired absence of right breast and nipple: Secondary | ICD-10-CM | POA: Diagnosis not present

## 2015-08-07 NOTE — Therapy (Signed)
[place], [place], [address] Phone: [phone]   Fax:  [phone]  Physical Therapy Evaluation  Patient Details  Name: Crystal [name] MRN: [id number] Date of Birth: [birth year] Referring Provider: Dr. Lurline Del  Encounter Date: 08/07/2015      PT End of Session - 08/07/15 1424    Visit Number 1   Number of Visits 8   Date for PT Re-Evaluation 09/18/15  Pt will take about 7-10 days off from PT after chemo treatments   PT Start Time 1345   PT Stop Time 1430   PT Time Calculation (min) 45 min   Activity Tolerance Patient tolerated treatment well   Behavior During Therapy [facility] for tasks assessed/performed      Past Medical History  Diagnosis Date  . History of small bowel obstruction   . Liver masses   . Insomnia   . GERD (gastroesophageal reflux disease)   . Complication of anesthesia     hard to wake up  . Cancer [facility])     right breast    Past Surgical History  Procedure Laterality Date  . Liver surgery    . Laparotomies    . Hernia repair      incisional hernia repair x3  . Breast surgery Right   . Mastectomy w/ sentinel node biopsy Right 07/05/2015    Procedure: RIGHT MASTECTOMY WITH SENTINEL NODE BIOPSY AND PORT A CATH INSERTION;  Surgeon: [name], MD;  Location: [place];  Service: General;  Laterality: Right;  . Portacath placement Left 07/05/2015    Procedure: INSERTION PORT-A-CATH;  Surgeon: [name], MD;  Location: [place];  Service: General;  Laterality: Left;    There were no vitals filed for this visit.  Visit Diagnosis:  S/P mastectomy, right - Plan: PT plan of care cert/re-cert  Shoulder stiffness, right - Plan: PT plan of care cert/re-cert  At risk for lymphedema - Plan: PT plan of care cert/re-cert      Subjective Assessment - 08/07/15 1352    Subjective Patient is s/p right mastectomy 07/05/15 with 4  axillary lymph nodes removed.  She is not having reconstruction.  She is currently undergoing chemotherapy.  She is here to increase shoulder ROM and strength.   Pertinent History She had a right lumpectomy and sentinel node biopsy in 2012.  It was DCIS, ER/PR positive breast cancer.  She was diagnosed recently with stage I right breast cancer and had a mastectomy and 4 nodes removed 07/05/15.  She is not on anti-estrogen therapy due to liver adenomas.     Patient Stated Goals Increase shoulder ROM   Currently in Pain? Yes   Pain Score 4    Pain Location Shoulder   Pain Orientation Right   Pain Descriptors / Indicators Tightness   Pain Type Surgical pain   Pain Onset 1 to 4 weeks ago   Pain Frequency Intermittent   Aggravating Factors  Reaching up   Pain Relieving Factors Rest   Multiple Pain Sites No            [facility] PT Assessment - 08/07/15 0001    Assessment   Medical Diagnosis s/p right mastectomy   Referring Provider Dr. Sarajane Jews Magrinat   Onset Date/Surgical Date 07/05/15   Hand Dominance Right   Next MD Visit 08/15/15   Prior Therapy none   Precautions   Precautions Other (comment)  Active cancer    Restrictions   Weight  Bearing Restrictions No   Balance Screen   Has the patient fallen in the past 6 months No   Has the patient had a decrease in activity level because of a fear of falling?  No   Is the patient reluctant to leave their home because of a fear of falling?  No   Home Ecologist residence   Living Arrangements Spouse/significant other   Available Help at Discharge Family   Prior Function   Level of Independence Independent   Vocation Part time employment   Surveyor, minerals to pastor   Leisure Walks daily 1 hour   Cognition   Overall Cognitive Status Within Functional Limits for tasks assessed   Posture/Postural Control   Posture/Postural Control Postural limitations   Postural Limitations Rounded Shoulders    ROM / Strength   AROM / PROM / Strength AROM;Strength   AROM   AROM Assessment Site Shoulder   Right/Left Shoulder Right;Left   Right Shoulder Extension 59 Degrees   Right Shoulder Flexion 133 Degrees   Right Shoulder ABduction 99 Degrees   Right Shoulder Internal Rotation 65 Degrees   Right Shoulder External Rotation 75 Degrees   Left Shoulder Extension 69 Degrees   Left Shoulder Flexion 166 Degrees   Left Shoulder ABduction 178 Degrees   Left Shoulder Internal Rotation 68 Degrees   Left Shoulder External Rotation 90 Degrees   Strength   Overall Strength Comments Right shoulder not tested due to recent surgery   Palpation   Palpation comment Incision on chest appears to be well healed with no signs of redness; scar is very tight and bound down.           LYMPHEDEMA/ONCOLOGY QUESTIONNAIRE - 08/07/15 1409    Type   Cancer Type right breast   Surgeries   Mastectomy Date 07/05/15   Lumpectomy Date --  Right lumpectomy 2012   Sentinel Lymph Node Biopsy Date 07/05/15   Number Lymph Nodes Removed 4   Treatment   Active Chemotherapy Treatment Yes   Date 07/25/15   Past Chemotherapy Treatment No   Active Radiation Treatment No   Past Radiation Treatment Yes   Current Hormone Treatment No   Past Hormone Therapy No   What other symptoms do you have   Are you Having Heaviness or Tightness No   Are you having Pain Yes   Are you having pitting edema No   Is it Hard or Difficult finding clothes that fit No   Do you have infections No   Is there Decreased scar mobility Yes   Stemmer Sign No   Lymphedema Assessments   Lymphedema Assessments Upper extremities   Right Upper Extremity Lymphedema   10 cm Proximal to Olecranon Process 24.5 cm   Olecranon Process 23.9 cm   10 cm Proximal to Ulnar Styloid Process 20.5 cm   Just Proximal to Ulnar Styloid Process 25.5 cm   Across Hand at [employer] 19.2 cm   At [place] of 2nd Digit 6 cm   Left Upper Extremity Lymphedema   10  cm Proximal to Olecranon Process 25.2 cm   Olecranon Process 24 cm   10 cm Proximal to Ulnar Styloid Process 20.4 cm   Just Proximal to Ulnar Styloid Process 15 cm   Across Hand at [employer] 18.1 cm   At [place] of 2nd Digit 5.7 cm                [facility] Adult  PT Treatment/Exercise - 08/07/15 0001    Shoulder Exercises: Pulleys   Flexion 2 minutes                PT Education - 08/07/15 1424    Education provided Yes   Education Details Shoulder ROM HEP   Person(s) Educated Patient   Methods Explanation;Handout;Demonstration;Tactile cues;Verbal cues   Comprehension Verbalized understanding;Returned demonstration                [facility] Goals - 08/07/15 1537    CC Long Term Goal  #1   Title Patient will be independent with a home exercise program for shoulder ROM and strengthening   Time 6   Period Weeks   Status New   CC Long Term Goal  #2   Title Patient will be able to verbalize understanding of lymphedema risk reduction practices   Time 6   Period Weeks   Status New   CC Long Term Goal  #3   Title Increase right shoulder active flexion to >/= 160 degrees for increased ease reaching overhead   Baseline 133 degrees   Time 6   Period Weeks   Status New   CC Long Term Goal  #4   Title Increase right shoulder abduction to >/= 160 degrees for increased ease reaching   Baseline 99 degrees   Time 6   Period Weeks   Status New   CC Long Term Goal  #5   Title Report she is able to reach overhead without complaints of increased pain in right axilla or shoulder.   Time 6   Period Weeks   Status New            Plan - 08/07/15 1425    Clinical Impression Statement Patient is a very pleasant 59 year old woman s/p right mastectomy and SLNB.  She is doing quite well since her surgery but wants to regain shoulder ROM and strength and wants information on lymphedema risk reduction.   Pt will benefit from skilled therapeutic intervention in  order to improve on the following deficits Decreased strength;Decreased knowledge of precautions;Pain;Impaired UE functional use;Decreased range of motion   Rehab Potential Excellent   Clinical Impairments Affecting Rehab Potential none   PT Frequency 2x / week   PT Duration 6 weeks   PT Treatment/Interventions Therapeutic exercise;Manual techniques;Passive range of motion;Patient/family education;Scar mobilization;ADLs/Self Care Home Management   PT Next Visit Plan ROM exercises, PROM; coming to ABC class 09/04/15. Gentle scar mobilization.   PT Home Exercise Plan Shoulder ROM HEP   Consulted and Agree with Plan of Care Patient         Problem List Patient Active Problem List   Diagnosis Date Noted  . Recurrent cancer of right breast ([place]) 06/05/2015  . Breast cancer, right breast [facility]) 03/16/2012    Annia Friendly, PT 08/07/2015 3:44 PM   [place], [place], [address] Phone: [phone]   Fax:  [phone]  Name: Crystal [name] MRN: [id number] Date of Birth: Sep 08, 1956

## 2015-08-07 NOTE — Patient Instructions (Signed)
Flexion (Eccentric) - Active (Cane)    Lift cane with both hands. Avoid hiking shoulders. Lower cane slowly for 3-5 seconds. __10_ reps per set, _2__ sets per day, _7__ days per week.   http://ecce.exer.us/155   Copyright  VHI. All rights reserved.  Closed Chain: Shoulder Flexion / Extension - on Wall    Hands on wall, step backward. Return. Stepping causes shoulder flexion and extension Do _3__ times holding 10 seconds, each foot, __2_ times per day.  http://ss.exer.us/265   Copyright  VHI. All rights reserved.  Closed Chain: Shoulder Abduction / Adduction - on Wall    One hand on wall, step to side and return. Stepping causes shoulder to abduct and adduct. Step _3__ times holding 10 seconds, each side, _2__ times per day.  http://ss.exer.us/267   Copyright  VHI. All rights reserved.  Cane Exercise: Abduction    Hold cane with right hand over end, palm-up, with other hand palm-down. Move arm out from side and up by pushing with other arm. Hold __3__ seconds. Repeat __10__ times. Do __2__ sessions per day.  http://gt2.exer.us/82   Copyright  VHI. All rights reserved.

## 2015-08-09 ENCOUNTER — Ambulatory Visit: Payer: BLUE CROSS/BLUE SHIELD

## 2015-08-09 ENCOUNTER — Telehealth: Payer: Self-pay | Admitting: Genetic Counselor

## 2015-08-09 ENCOUNTER — Ambulatory Visit: Payer: Self-pay | Admitting: Genetic Counselor

## 2015-08-09 DIAGNOSIS — M25611 Stiffness of right shoulder, not elsewhere classified: Secondary | ICD-10-CM

## 2015-08-09 DIAGNOSIS — Z9189 Other specified personal risk factors, not elsewhere classified: Secondary | ICD-10-CM

## 2015-08-09 DIAGNOSIS — Z9011 Acquired absence of right breast and nipple: Secondary | ICD-10-CM

## 2015-08-09 DIAGNOSIS — Z1379 Encounter for other screening for genetic and chromosomal anomalies: Secondary | ICD-10-CM

## 2015-08-09 NOTE — Therapy (Signed)
[place], [place], [address] Phone: [phone]   Fax:  [phone]  Physical Therapy Treatment  Patient Details  Name: Crystal [name] MRN: [id number] Date of Birth: March 20, 1956 Referring Provider: Dr. Lurline Del  Encounter Date: 08/09/2015      PT End of Session - 08/09/15 1608    Visit Number 2   Number of Visits 8   Date for PT Re-Evaluation 09/18/15  Pt will take about 7-10 days off after chemo treatments   PT Start Time 1525   PT Stop Time 1606   PT Time Calculation (min) 41 min   Activity Tolerance Patient tolerated treatment well   Behavior During Therapy [facility] for tasks assessed/performed      Past Medical History  Diagnosis Date  . History of small bowel obstruction   . Liver masses   . Insomnia   . GERD (gastroesophageal reflux disease)   . Complication of anesthesia     hard to wake up  . Cancer [facility])     right breast    Past Surgical History  Procedure Laterality Date  . Liver surgery    . Laparotomies    . Hernia repair      incisional hernia repair x3  . Breast surgery Right   . Mastectomy w/ sentinel node biopsy Right 07/05/2015    Procedure: RIGHT MASTECTOMY WITH SENTINEL NODE BIOPSY AND PORT A CATH INSERTION;  Surgeon: [name], MD;  Location: [place];  Service: General;  Laterality: Right;  . Portacath placement Left 07/05/2015    Procedure: INSERTION PORT-A-CATH;  Surgeon: [name], MD;  Location: [place];  Service: General;  Laterality: Left;    There were no vitals filed for this visit.  Visit Diagnosis:  S/P mastectomy, right  Shoulder stiffness, right  At risk for lymphedema      Subjective Assessment - 08/09/15 1526    Subjective Feeling fine, no c/o pain.    Currently in Pain? No/denies                         [facility] Adult PT Treatment/Exercise - 08/09/15 0001    Shoulder  Exercises: Pulleys   Flexion 2 minutes   ABduction 2 minutes   Shoulder Exercises: Therapy Ball   Flexion 10 reps   ABduction 5 reps   Shoulder Exercises: ROM/Strengthening   Other ROM/Strengthening Exercises Modified downward dog on wall 5 reps, 5 seconds   Manual Therapy   Myofascial Release At Rt chest wall to pts tolerance   Passive ROM To Rt shoulder into flexion, abduction, D2, and neural tension stretch in supine                        [facility] Goals - 08/07/15 1537    CC Long Term Goal  #1   Title Patient will be independent with a home exercise program for shoulder ROM and strengthening   Time 6   Period Weeks   Status New   CC Long Term Goal  #2   Title Patient will be able to verbalize understanding of lymphedema risk reduction practices   Time 6   Period Weeks   Status New   CC Long Term Goal  #3   Title Increase right shoulder active flexion to >/= 160 degrees for increased ease reaching overhead   Baseline 133 degrees   Time 6  Period Weeks   Status New   CC Long Term Goal  #4   Title Increase right shoulder abduction to >/= 160 degrees for increased ease reaching   Baseline 99 degrees   Time 6   Period Weeks   Status New   CC Long Term Goal  #5   Title Report she is able to reach overhead without complaints of increased pain in right axilla or shoulder.   Time 6   Period Weeks   Status New            Plan - 08/09/15 1608    Clinical Impression Statement Pt toelrated treatment well with just having some pain at end PROM of abduction and some tenderness at Rt chest wall with myofascial release. Overall she reported feeling better after treatment.   Pt will benefit from skilled therapeutic intervention in order to improve on the following deficits Decreased strength;Decreased knowledge of precautions;Pain;Impaired UE functional use;Decreased range of motion   Rehab Potential Excellent   Clinical Impairments Affecting Rehab  Potential none   PT Frequency 2x / week   PT Duration 6 weeks   PT Treatment/Interventions Therapeutic exercise;Manual techniques;Passive range of motion;Patient/family education;Scar mobilization;ADLs/Self Care Home Management   PT Next Visit Plan ROM exercises, PROM; coming to ABC class 09/04/15. Gentle scar mobilization.   Consulted and Agree with Plan of Care Patient        Problem List Patient Active Problem List   Diagnosis Date Noted  . Recurrent cancer of right breast ([place]) 06/05/2015  . Breast cancer, right breast ([place]) 03/16/2012    Otelia Limes, PTA 08/09/2015, 4:10 PM  [place], [place], [address] Phone: [phone]   Fax:  [phone]  Name: Crystal [name] MRN: [id number] Date of Birth: 04/06/1956

## 2015-08-10 NOTE — Telephone Encounter (Signed)
Discussed with Ms. Mumaw that her genetic test results were negative for mutations within any of 32 genes that would cause her to be at an increased risk for breast, ovarian, or other related cancers.  Additionally, no variants of uncertain significance (VUSes) were found.  Thus, we still do not have an explanation for Ms. Lord's breast cancer or for her family history.  It could be the case that we are just not testing the right genes, that there is a genetic explanation for the family history that she did not herself inherit, or that all of the cancer in the family is actually sporadic.  At this point in time, there is no additional tests we would think to add.  I will keep an eye out for any research studies that Ms. Labree may be a good fit for.  She is always welcome to check back with [place] in the future to find out if there are any updated testing options available at that time.  We discussed that future cancer screening should be based on personal and family history of cancer.  Ms. Delval daughter has not yet begun mammogram screening, but will discuss this with her primary care physician in light of the family history of breast and ovarian cancer.  Ms. Volpi and her family members are welcome to call me with any further questions.

## 2015-08-11 DIAGNOSIS — Z1379 Encounter for other screening for genetic and chromosomal anomalies: Secondary | ICD-10-CM | POA: Insufficient documentation

## 2015-08-11 NOTE — Progress Notes (Addendum)
GENETIC TEST RESULTS  HPI: Crystal [name] was previously seen in the [facility] due to a personal history of breast cancer and hepatic adenoma, family history of breast, ovarian, and other cancers, and concerns regarding a hereditary predisposition to cancer.  Crystal [name] previously had negative BRACAnalysis testing (BRCA1/2 sequencing with BART) through [facility] [place], [place]).  Date of report is Mar 08, 2011.  Please refer to our prior cancer genetics clinic note from July 20, 2015 for more information regarding Crystal [name]'s medical, social and family histories, and our assessment and recommendations, at the time. Crystal [name] recent genetic test results were disclosed to her, as were recommendations warranted by these results. These results and recommendations are discussed in more detail below.  GENETIC TEST RESULTS: At the time of Crystal [name]'s visit on 07/20/15, we recommended she pursue genetic testing of the 32-gene Comprehensive Cancer Panel through [facility] Basilio Cairo, MD).  The Comprehensive Cancer Panel offered by [facility] includes sequencing and/or deletion duplication testing of the following 32 genes: APC, ATM, AXIN2, BARD1, BMPR1A, BRCA1, BRCA2, BRIP1, CDH1, CDK4, CDKN2A, CHEK2, EPCAM, FANCC, MLH1, MSH2, MSH6, MUTYH, NBN, PALB2, PMS2, POLD1, POLE, PTEN, RAD51C, RAD51D, SCG5/GREM1, SMAD4, STK11, TP53, VHL, and XRCC2.  Those results are now back, the report date for which is August 08, 2015.  Genetic testing was normal, and did not reveal a deleterious mutation in these genes.  Additionally, no variants of uncertain significance (VUSes) were found.  The test report will be scanned into EPIC and will be located under the Results Review tab in the Pathology>Molecular Pathology section.   We discussed with Crystal [name] that since the current genetic testing is not perfect, it is possible there may be a gene mutation in one of these genes that  current testing cannot detect, but that chance is small. We also discussed, that it is possible that another gene that has not yet been discovered, or that we have not yet tested, is responsible for the cancer diagnoses in the family, and it is, therefore, important to remain in touch with cancer genetics in the future so that we can continue to offer Crystal [name] the most up to date genetic testing.   CANCER SCREENING RECOMMENDATIONS: We still do not have an explanation for the personal and family history of cancer.  It could be that this result is reassuring and indicates that Crystal [name] likely does not have an increased risk for a future cancer due to a mutation in one of these genes. This normal test would then also suggest that Crystal [name]'s cancer was most likely not due to an inherited predisposition associated with one of these genes.  Most cancers happen by chance and this negative test suggests that her cancer falls into this category.  However, it could also be the case that there is a hereditary cancer syndrome in the family that explains the family history of cancer and that Crystal [name] did not inherit this herself, especially since she was diagnosed at a later age than the other breast/ovarian cancer in the family.  There is also a chance that we are just not testing the right genes at this point in time.  Thus, this result still cannot rule out a genetic cause for Crystal [name]'s breast cancer.  We, therefore, recommended she continue to follow the cancer management and screening guidelines provided by her oncology and primary healthcare providers.   Currently there are no further tests that we would consider.  We will keep an eye out for any research studies that Crystal [name] may qualify for.  Additional genetic testing for her brothers might help to determine whether there is a genetic cause for the cancer in  Crystal [name]'s mother that she might not have inherited.    RECOMMENDATIONS FOR FAMILY  MEMBERS: Women in this family might be at some increased risk of developing cancer, over the [name] population risk, simply due to the family history of cancer. We recommended women in this family have a yearly mammogram beginning at age 60, or 24 years younger than the earliest onset of cancer, an an annual clinical breast exam, and perform monthly breast self-exams. Crystal [name] daughter has not yet begun mammogram screening, but will discuss this with her primary care physician in light of the family history of breast and ovarian cancer.  Women in this family should also have a gynecological exam as recommended by their primary provider. All family members should have a colonoscopy by age 14.  Based on Crystal [name]'s family history, we recommended her two full brothers have genetic counseling and testing, if interested. Crystal [name] will let us know if we can be of any assistance in coordinating genetic counseling and/or testing for these family members.   FOLLOW-UP: Lastly, we discussed with Crystal [name] that cancer genetics is a rapidly advancing field and it is possible that new genetic tests will be appropriate for her and/or her family members in the future. We encouraged her to remain in contact with cancer genetics on an annual basis so we can update her personal and family histories and let her know of advances in cancer genetics that may benefit this family.   Our contact number was provided. Crystal [name] questions were answered to her satisfaction, and she knows she is welcome to call us at anytime with additional questions or concerns.   Jeanine Luz, MS Genetic Counselor [email or web address] Phone: [phone]

## 2015-08-14 ENCOUNTER — Other Ambulatory Visit: Payer: Self-pay | Admitting: *Deleted

## 2015-08-14 ENCOUNTER — Ambulatory Visit (HOSPITAL_COMMUNITY): Payer: BLUE CROSS/BLUE SHIELD

## 2015-08-14 DIAGNOSIS — C50911 Malignant neoplasm of unspecified site of right female breast: Secondary | ICD-10-CM

## 2015-08-15 ENCOUNTER — Encounter: Payer: Self-pay | Admitting: Nurse Practitioner

## 2015-08-15 ENCOUNTER — Ambulatory Visit (HOSPITAL_BASED_OUTPATIENT_CLINIC_OR_DEPARTMENT_OTHER): Payer: BLUE CROSS/BLUE SHIELD

## 2015-08-15 ENCOUNTER — Other Ambulatory Visit (HOSPITAL_BASED_OUTPATIENT_CLINIC_OR_DEPARTMENT_OTHER): Payer: BLUE CROSS/BLUE SHIELD

## 2015-08-15 ENCOUNTER — Ambulatory Visit (HOSPITAL_BASED_OUTPATIENT_CLINIC_OR_DEPARTMENT_OTHER): Payer: BLUE CROSS/BLUE SHIELD | Admitting: Nurse Practitioner

## 2015-08-15 ENCOUNTER — Encounter: Payer: Self-pay | Admitting: *Deleted

## 2015-08-15 ENCOUNTER — Ambulatory Visit: Payer: BLUE CROSS/BLUE SHIELD

## 2015-08-15 VITALS — BP 117/58 | HR 80 | Temp 98.2°F | Resp 18 | Ht 66.0 in | Wt 136.7 lb

## 2015-08-15 DIAGNOSIS — R911 Solitary pulmonary nodule: Secondary | ICD-10-CM

## 2015-08-15 DIAGNOSIS — C50411 Malignant neoplasm of upper-outer quadrant of right female breast: Secondary | ICD-10-CM | POA: Diagnosis not present

## 2015-08-15 DIAGNOSIS — Z5111 Encounter for antineoplastic chemotherapy: Secondary | ICD-10-CM | POA: Diagnosis not present

## 2015-08-15 DIAGNOSIS — R748 Abnormal levels of other serum enzymes: Secondary | ICD-10-CM | POA: Diagnosis not present

## 2015-08-15 DIAGNOSIS — C50911 Malignant neoplasm of unspecified site of right female breast: Secondary | ICD-10-CM

## 2015-08-15 DIAGNOSIS — R7989 Other specified abnormal findings of blood chemistry: Secondary | ICD-10-CM

## 2015-08-15 LAB — CBC WITH DIFFERENTIAL/PLATELET
BASO%: 0.2 % (ref 0.0–2.0)
Basophils Absolute: 0 10*3/uL (ref 0.0–0.1)
EOS ABS: 0 10*3/uL (ref 0.0–0.5)
EOS%: 0 % (ref 0.0–7.0)
HEMATOCRIT: 35.7 % (ref 34.8–46.6)
HEMOGLOBIN: 11.6 g/dL (ref 11.6–15.9)
LYMPH#: 0.8 10*3/uL — AB (ref 0.9–3.3)
LYMPH%: 6.1 % — ABNORMAL LOW (ref 14.0–49.7)
MCH: 29 pg (ref 25.1–34.0)
MCHC: 32.4 g/dL (ref 31.5–36.0)
MCV: 89.6 fL (ref 79.5–101.0)
MONO#: 0.4 10*3/uL (ref 0.1–0.9)
MONO%: 3.1 % (ref 0.0–14.0)
NEUT%: 90.6 % — AB (ref 38.4–76.8)
NEUTROS ABS: 11.9 10*3/uL — AB (ref 1.5–6.5)
PLATELETS: 351 10*3/uL (ref 145–400)
RBC: 3.98 10*6/uL (ref 3.70–5.45)
RDW: 13.7 % (ref 11.2–14.5)
WBC: 13.2 10*3/uL — ABNORMAL HIGH (ref 3.9–10.3)

## 2015-08-15 LAB — COMPREHENSIVE METABOLIC PANEL (CC13)
ALBUMIN: 4 g/dL (ref 3.5–5.0)
ALK PHOS: 166 U/L — AB (ref 40–150)
ALT: 68 U/L — ABNORMAL HIGH (ref 0–55)
ANION GAP: 9 meq/L (ref 3–11)
AST: 18 U/L (ref 5–34)
BUN: 18.3 mg/dL (ref 7.0–26.0)
CALCIUM: 10 mg/dL (ref 8.4–10.4)
CHLORIDE: 106 meq/L (ref 98–109)
CO2: 24 mEq/L (ref 22–29)
Creatinine: 0.8 mg/dL (ref 0.6–1.1)
EGFR: 81 mL/min/{1.73_m2} — AB (ref >90)
Glucose: 240 mg/dl — ABNORMAL HIGH (ref 70–140)
POTASSIUM: 4.2 meq/L (ref 3.5–5.1)
Sodium: 139 mEq/L (ref 136–145)
Total Bilirubin: <0.3 mg/dL (ref 0.20–1.20)
Total Protein: 7.3 g/dL (ref 6.4–8.3)

## 2015-08-15 MED ORDER — SODIUM CHLORIDE 0.9 % IV SOLN
Freq: Once | INTRAVENOUS | Status: AC
  Administered 2015-08-15: 12:00:00 via INTRAVENOUS
  Filled 2015-08-15: qty 8

## 2015-08-15 MED ORDER — PEGFILGRASTIM 6 MG/0.6ML ~~LOC~~ PSKT
6.0000 mg | PREFILLED_SYRINGE | Freq: Once | SUBCUTANEOUS | Status: AC
  Administered 2015-08-15: 6 mg via SUBCUTANEOUS
  Filled 2015-08-15: qty 0.6

## 2015-08-15 MED ORDER — SODIUM CHLORIDE 0.9 % IV SOLN
Freq: Once | INTRAVENOUS | Status: AC
  Administered 2015-08-15: 12:00:00 via INTRAVENOUS

## 2015-08-15 MED ORDER — SODIUM CHLORIDE 0.9 % IJ SOLN
10.0000 mL | INTRAMUSCULAR | Status: DC | PRN
  Administered 2015-08-15: 10 mL
  Filled 2015-08-15: qty 10

## 2015-08-15 MED ORDER — SODIUM CHLORIDE 0.9 % IV SOLN
600.0000 mg/m2 | Freq: Once | INTRAVENOUS | Status: AC
  Administered 2015-08-15: 1020 mg via INTRAVENOUS
  Filled 2015-08-15: qty 51

## 2015-08-15 MED ORDER — HEPARIN SOD (PORK) LOCK FLUSH 100 UNIT/ML IV SOLN
500.0000 [IU] | Freq: Once | INTRAVENOUS | Status: AC | PRN
  Administered 2015-08-15: 500 [IU]
  Filled 2015-08-15: qty 5

## 2015-08-15 MED ORDER — DOCETAXEL CHEMO INJECTION 160 MG/16ML
75.0000 mg/m2 | Freq: Once | INTRAVENOUS | Status: AC
  Administered 2015-08-15: 130 mg via INTRAVENOUS
  Filled 2015-08-15: qty 13

## 2015-08-15 NOTE — Progress Notes (Signed)
Pt Ok to treat with Pt labs Alk phos 166 and AST 68 per Gentry Fitz, NP

## 2015-08-15 NOTE — Patient Instructions (Signed)
[facility] Discharge Instructions for Patients Receiving Chemotherapy  Today you received the following chemotherapy agents: Taxotere and Cytoxan   To help prevent nausea and vomiting after your treatment, we encourage you to take your nausea medication as directed.    If you develop nausea and vomiting that is not controlled by your nausea medication, call the clinic.   BELOW ARE SYMPTOMS THAT SHOULD BE REPORTED IMMEDIATELY:  *FEVER GREATER THAN 100.5 F  *CHILLS WITH OR WITHOUT FEVER  NAUSEA AND VOMITING THAT IS NOT CONTROLLED WITH YOUR NAUSEA MEDICATION  *UNUSUAL SHORTNESS OF BREATH  *UNUSUAL BRUISING OR BLEEDING  TENDERNESS IN MOUTH AND THROAT WITH OR WITHOUT PRESENCE OF ULCERS  *URINARY PROBLEMS  *BOWEL PROBLEMS  UNUSUAL RASH Items with * indicate a potential emergency and should be followed up as soon as possible.  Feel free to call the clinic you have any questions or concerns. The clinic phone number is [phone].  Please show the [facility] at check-in to the Emergency Department and triage nurse.   Docetaxel injection What is this medicine? DOCETAXEL (doe se TAX el) is a chemotherapy drug. It targets fast dividing cells, like cancer cells, and causes these cells to die. This medicine is used to treat many types of cancers like breast cancer, certain stomach cancers, head and neck cancer, lung cancer, and prostate cancer. This medicine may be used for other purposes; ask your health care provider or pharmacist if you have questions. What should I tell my health care provider before I take this medicine? They need to know if you have any of these conditions: -infection (especially a virus infection such as chickenpox, cold sores, or herpes) -liver disease -low blood counts, like low white cell, platelet, or red cell counts -an unusual or allergic reaction to docetaxel, polysorbate 80, other chemotherapy agents, other medicines, foods, dyes,  or preservatives -pregnant or trying to get pregnant -breast-feeding How should I use this medicine? This drug is given as an infusion into a vein. It is administered in a hospital or clinic by a specially trained health care professional. Talk to your pediatrician regarding the use of this medicine in children. Special care may be needed. Overdosage: If you think you have taken too much of this medicine contact a poison control center or emergency room at once. NOTE: This medicine is only for you. Do not share this medicine with others. What if I miss a dose? It is important not to miss your dose. Call your doctor or health care professional if you are unable to keep an appointment. What may interact with this medicine? -cyclosporine -erythromycin -ketoconazole -medicines to increase blood counts like filgrastim, pegfilgrastim, sargramostim -vaccines Talk to your doctor or health care professional before taking any of these medicines: -acetaminophen -aspirin -ibuprofen -ketoprofen -naproxen This list may not describe all possible interactions. Give your health care provider a list of all the medicines, herbs, non-prescription drugs, or dietary supplements you use. Also tell them if you smoke, drink alcohol, or use illegal drugs. Some items may interact with your medicine. What should I watch for while using this medicine? Your condition will be monitored carefully while you are receiving this medicine. You will need important blood work done while you are taking this medicine. This drug may make you feel generally unwell. This is not uncommon, as chemotherapy can affect healthy cells as well as cancer cells. Report any side effects. Continue your course of treatment even though you feel ill unless your  doctor tells you to stop. In some cases, you may be given additional medicines to help with side effects. Follow all directions for their use. Call your doctor or health care professional  for advice if you get a fever, chills or sore throat, or other symptoms of a cold or flu. Do not treat yourself. This drug decreases your body's ability to fight infections. Try to avoid being around people who are sick. This medicine may increase your risk to bruise or bleed. Call your doctor or health care professional if you notice any unusual bleeding. This medicine may contain alcohol in the product. You may get drowsy or dizzy. Do not drive, use machinery, or do anything that needs mental alertness until you know how this medicine affects you. Do not stand or sit up quickly, especially if you are an older patient. This reduces the risk of dizzy or fainting spells. Avoid alcoholic drinks. Do not become pregnant while taking this medicine. Women should inform their doctor if they wish to become pregnant or think they might be pregnant. There is a potential for serious side effects to an unborn child. Talk to your health care professional or pharmacist for more information. Do not breast-feed an infant while taking this medicine. What side effects may I notice from receiving this medicine? Side effects that you should report to your doctor or health care professional as soon as possible: -allergic reactions like skin rash, itching or hives, swelling of the face, lips, or tongue -low blood counts - This drug may decrease the number of white blood cells, red blood cells and platelets. You may be at increased risk for infections and bleeding. -signs of infection - fever or chills, cough, sore throat, pain or difficulty passing urine -signs of decreased platelets or bleeding - bruising, pinpoint red spots on the skin, black, tarry stools, nosebleeds -signs of decreased red blood cells - unusually weak or tired, fainting spells, lightheadedness -breathing problems -fast or irregular heartbeat -low blood pressure -mouth sores -nausea and vomiting -pain, swelling, redness or irritation at the injection  site -pain, tingling, numbness in the hands or feet -swelling of the ankle, feet, hands -weight gain Side effects that usually do not require medical attention (report to your prescriber or health care professional if they continue or are bothersome): -bone pain -complete hair loss including hair on your head, underarms, pubic hair, eyebrows, and eyelashes -diarrhea -excessive tearing -changes in the color of fingernails -loosening of the fingernails -nausea -muscle pain -red flush to skin -sweating -weak or tired This list may not describe all possible side effects. Call your doctor for medical advice about side effects. You may report side effects to FDA at 1-800-FDA-1088. Where should I keep my medicine? This drug is given in a hospital or clinic and will not be stored at home. NOTE: This sheet is a summary. It may not cover all possible information. If you have questions about this medicine, talk to your doctor, pharmacist, or health care provider.    2016, [employer]. (2014-10-17 16:04:57)   Cyclophosphamide injection What is this medicine? CYCLOPHOSPHAMIDE (sye kloe FOSS fa mide) is a chemotherapy drug. It slows the growth of cancer cells. This medicine is used to treat many types of cancer like lymphoma, myeloma, leukemia, breast cancer, and ovarian cancer, to name a few. This medicine may be used for other purposes; ask your health care provider or pharmacist if you have questions. What should I tell my health care provider before I take this  medicine? They need to know if you have any of these conditions: -blood disorders -history of other chemotherapy -infection -kidney disease -liver disease -recent or ongoing radiation therapy -tumors in the bone marrow -an unusual or allergic reaction to cyclophosphamide, other chemotherapy, other medicines, foods, dyes, or preservatives -pregnant or trying to get pregnant -breast-feeding How should I use this  medicine? This drug is usually given as an injection into a vein or muscle or by infusion into a vein. It is administered in a hospital or clinic by a specially trained health care professional. Talk to your pediatrician regarding the use of this medicine in children. Special care may be needed. Overdosage: If you think you have taken too much of this medicine contact a poison control center or emergency room at once. NOTE: This medicine is only for you. Do not share this medicine with others. What if I miss a dose? It is important not to miss your dose. Call your doctor or health care professional if you are unable to keep an appointment. What may interact with this medicine? This medicine may interact with the following medications: -amiodarone -amphotericin B -azathioprine -certain antiviral medicines for HIV or AIDS such as protease inhibitors (e.g., indinavir, ritonavir) and zidovudine -certain blood pressure medications such as benazepril, captopril, enalapril, fosinopril, lisinopril, moexipril, monopril, perindopril, quinapril, ramipril, trandolapril -certain cancer medications such as anthracyclines (e.g., daunorubicin, doxorubicin), busulfan, cytarabine, paclitaxel, pentostatin, tamoxifen, trastuzumab -certain diuretics such as chlorothiazide, chlorthalidone, hydrochlorothiazide, indapamide, metolazone -certain medicines that treat or prevent blood clots like warfarin -certain muscle relaxants such as succinylcholine -cyclosporine -etanercept -indomethacin -medicines to increase blood counts like filgrastim, pegfilgrastim, sargramostim -medicines used as general anesthesia -metronidazole -natalizumab This list may not describe all possible interactions. Give your health care provider a list of all the medicines, herbs, non-prescription drugs, or dietary supplements you use. Also tell them if you smoke, drink alcohol, or use illegal drugs. Some items may interact with your  medicine. What should I watch for while using this medicine? Visit your doctor for checks on your progress. This drug may make you feel generally unwell. This is not uncommon, as chemotherapy can affect healthy cells as well as cancer cells. Report any side effects. Continue your course of treatment even though you feel ill unless your doctor tells you to stop. Drink water or other fluids as directed. Urinate often, even at night. In some cases, you may be given additional medicines to help with side effects. Follow all directions for their use. Call your doctor or health care professional for advice if you get a fever, chills or sore throat, or other symptoms of a cold or flu. Do not treat yourself. This drug decreases your body's ability to fight infections. Try to avoid being around people who are sick. This medicine may increase your risk to bruise or bleed. Call your doctor or health care professional if you notice any unusual bleeding. Be careful brushing and flossing your teeth or using a toothpick because you may get an infection or bleed more easily. If you have any dental work done, tell your dentist you are receiving this medicine. You may get drowsy or dizzy. Do not drive, use machinery, or do anything that needs mental alertness until you know how this medicine affects you. Do not become pregnant while taking this medicine or for 1 year after stopping it. Women should inform their doctor if they wish to become pregnant or think they might be pregnant. Men should not father a child while  taking this medicine and for 4 months after stopping it. There is a potential for serious side effects to an unborn child. Talk to your health care professional or pharmacist for more information. Do not breast-feed an infant while taking this medicine. This medicine may interfere with the ability to have a child. This medicine has caused ovarian failure in some women. This medicine has caused reduced sperm  counts in some men. You should talk with your doctor or health care professional if you are concerned about your fertility. If you are going to have surgery, tell your doctor or health care professional that you have taken this medicine. What side effects may I notice from receiving this medicine? Side effects that you should report to your doctor or health care professional as soon as possible: -allergic reactions like skin rash, itching or hives, swelling of the face, lips, or tongue -low blood counts - this medicine may decrease the number of white blood cells, red blood cells and platelets. You may be at increased risk for infections and bleeding. -signs of infection - fever or chills, cough, sore throat, pain or difficulty passing urine -signs of decreased platelets or bleeding - bruising, pinpoint red spots on the skin, black, tarry stools, blood in the urine -signs of decreased red blood cells - unusually weak or tired, fainting spells, lightheadedness -breathing problems -dark urine -dizziness -palpitations -swelling of the ankles, feet, hands -trouble passing urine or change in the amount of urine -weight gain -yellowing of the eyes or skin Side effects that usually do not require medical attention (report to your doctor or health care professional if they continue or are bothersome): -changes in nail or skin color -hair loss -missed menstrual periods -mouth sores -nausea, vomiting This list may not describe all possible side effects. Call your doctor for medical advice about side effects. You may report side effects to FDA at 1-800-FDA-1088. Where should I keep my medicine? This drug is given in a hospital or clinic and will not be stored at home. NOTE: This sheet is a summary. It may not cover all possible information. If you have questions about this medicine, talk to your doctor, pharmacist, or health care provider.    2016, [employer]. (2012-08-14  16:22:58)

## 2015-08-15 NOTE — Progress Notes (Signed)
ID: Crystal [name]   DOB: 05-07-1956  MR#: [id number]  CSN#:[id number]  PCP: Kandice Hams, MD GYN: SU: [name] MD OTHER MD: Earle Gell, Daneen Schick, Arnoldo Hooker Thimmappa]  CHIEF COMPLAINT: recurrent estrogen receptor positive breast cancer  CURRENT TREATMENT: Awaiting definitive surgery  BREAST CANCER HISTORY:   From the original intake note:  The patient had bilateral diagnostic mammography 02/12/2011.  She has a history of cystic changes noted on prior studies in April 2011 and April 2010.  On the Feb 12, 2011 study there were new calcifications identified superiorly in the right breast, without any obvious mass.  The patient was brought back for further studies, 02/19/2011 and Dr. Marcelo Baldy was able to localize the calcifications under stereotactic guidance.  The biopsy ([id number]) showed high-grade ductal carcinoma with papillary features which was 70% estrogen receptor positive, 0% progesterone receptor positive.  There was no definitive invasion and therefore further studies were not performed.   Bilateral breast MRIs were obtained Feb 26, 2011.  This showed marked nodular enhancement throughout both breasts, and in the upper central portion there was a post biopsy area of change measuring up to 3.7 cm.  Most of this is a fluid cavity with a thin rim of enhancement.  There were no suspicious areas in the left breast.  No internal mammary or axillary lymph nodes.  Of course, the patient has a history of hepatic adenomas and these were noted incidentally on the MRI. Her definitive surgical treatment and subsequent history are as detailed below.  INTERVAL HISTORY: [name] returns today for follow up of her now recurrent breast cancer. Today is day 1, cycle 2 of cyclophosphamide and docetaxel, with neulasta onpro given on day 2 for granulocyte support.  REVIEW OF SYSTEMS: Zoha is feeling better this week. Her bone pain has resolved. She only took the tramadol twice. Her appetite  is back. She is moving her bowels well. The irritation to her eyes has cleared with Clear Eyes drops. She denies fevers, chills, nausea, or vomiting. Her reflux has improved on OTC omeprazole. She denies mouth sores, rashes, or neuropathy symptoms. Her energy level is good. A detailed review of systems is otherwise stable.  PAST MEDICAL HISTORY: Past Medical History  Diagnosis Date  . History of small bowel obstruction   . Liver masses   . Insomnia   . GERD (gastroesophageal reflux disease)   . Complication of anesthesia     hard to wake up  . Cancer [facility])     right breast  history of small-bowel obstruction with partial colectomy March 2001.  The patient has a history of hepatic adenomas.  She has had liver resections x2.  The data for this is at [facility] and we will try to obtain it.  She has also had incisional hernia repair.  She has a history of osteopenia, and she has a history of multiple anti-red cell antibodies making a transfusion difficult.  The patient tells me that she has a history of low calcium and a tendency to get liver injections.  PAST SURGICAL HISTORY: Past Surgical History  Procedure Laterality Date  . Liver surgery    . Laparotomies    . Hernia repair      incisional hernia repair x3  . Breast surgery Right   . Mastectomy w/ sentinel node biopsy Right 07/05/2015    Procedure: RIGHT MASTECTOMY WITH SENTINEL NODE BIOPSY AND PORT A CATH INSERTION;  Surgeon: [name], MD;  Location: [place];  Service: General;  Laterality: Right;  . Portacath placement Left 07/05/2015    Procedure: INSERTION PORT-A-CATH;  Surgeon: [name], MD;  Location: [place];  Service: General;  Laterality: Left;    FAMILY HISTORY Family History  Problem Relation Age of Onset  . Ovarian cancer Mother 62    "metastasized to breasts"  . Breast cancer Maternal Grandmother 48    2-3 recurrences  . Stomach cancer Maternal Grandfather     dx. 47-49;  metastatic stomach cancer  . Colon polyps Father     about 2-3 polyps removed at each colonoscopy  . Colon polyps Brother     "a few"  . Other Maternal Aunt     +TAH  . Colon cancer Paternal Aunt 90  . Brain cancer Daughter 17    astrocytoma  . Breast cancer Cousin     dx. 82s  The patient's mother died from ovarian cancer at the age of 39.  The patient's father is alive at age 63.  She has 2 brothers, no sisters.  A maternal grandmother was diagnosed with breast cancer at the age of 71, and the maternal grandfather had stomach cancer at the age of 84.  GYNECOLOGIC HISTORY: The patient had menarche at age 59.  Her [name] menstrual period was in 2005.  She never used hormone replacement.  She used birth control briefly to control bleeding prior to 1980.  She was 59 years old at the birth of her first child.  She started having mammograms at age 59 because of a strong family history.  SOCIAL HISTORY: Deyra works as a Programmer, applications for [employer].   She also heads the local "[employer]" Her husband, Cherith Tewell, is a retired Engineer, maintenance from a [employer] locally.  Son, [name], lives in [place] and works for that [employer]  Daughter, [name], lives in [place] and is a homemaker.   ADVANCED DIRECTIVES: in place  HEALTH MAINTENANCE: Social History  Substance Use Topics  . Smoking status: Never Smoker   . Smokeless tobacco: Never Used  . Alcohol Use: 0.6 oz/week    1 Glasses of wine per week     Comment: social     Colonoscopy:  PAP:  Bone density:  Lipid panel:  Allergies  Allergen Reactions  . Promethazine Hcl Other (See Comments)    Causes shaking  . Tylenol [Acetaminophen]     Avoids Tylenol products due to liver disease   . Penicillins Rash    Current Outpatient Prescriptions  Medication Sig Dispense Refill  . Carboxymethylcellul-Glycerin (CLEAR EYES FOR DRY EYES OP) Apply 1 drop to eye as needed.    Marland Kitchen b complex  vitamins tablet Take 1 tablet by mouth daily.      . calcium-vitamin D (OSCAL WITH D) 500-200 MG-UNIT per tablet Take 2 tablets by mouth daily.    Marland Kitchen dexamethasone (DECADRON) 4 MG tablet Take 2 tablets (8 mg total) by mouth 2 (two) times daily. Start the day before Taxotere. Then again the day after chemo for 3 days. 30 tablet 1  . famotidine (PEPCID AC) 10 MG chewable tablet Chew 10 mg by mouth daily as needed for heartburn.    . fish oil-omega-3 fatty acids 1000 MG capsule Take 2 g by mouth daily.      Marland Kitchen glucosamine-chondroitin 500-400 MG tablet Take 1 tablet by mouth 3 (three) times daily.      Marland Kitchen ibuprofen (ADVIL,MOTRIN) 200 MG tablet Take 200 mg by  mouth every 6 (six) hours as needed.    . lidocaine-prilocaine (EMLA) cream Apply to affected area once 30 g 3  . LORazepam (ATIVAN) 0.5 MG tablet Take 1 tablet (0.5 mg total) by mouth at bedtime. 30 tablet 0  . Multiple Vitamins-Minerals (MULTIVITAMIN WITH MINERALS) tablet Take 1 tablet by mouth daily.      . ondansetron (ZOFRAN) 8 MG tablet Take 1 tablet (8 mg total) by mouth 2 (two) times daily. Start the day after chemo for 3 days. Then take as needed for nausea or vomiting. 30 tablet 1  . prochlorperazine (COMPAZINE) 10 MG tablet Take 1 tablet (10 mg total) by mouth every 6 (six) hours as needed (Nausea or vomiting). 30 tablet 1  . tobramycin-dexamethasone (TOBRADEX) ophthalmic solution Place 1 drop into both eyes 2 (two) times daily. 5 mL 1  . traMADol (ULTRAM) 50 MG tablet Take 1 tablet (50 mg total) by mouth every 6 (six) hours as needed. 60 tablet 2  . TURMERIC PO Take 400 mg by mouth daily. Pt takes 2 tablets 400 mg each= 800 mg total     No current facility-administered medications for this visit.    OBJECTIVE: Middle-aged white woman who appears younger than stated age  59 Vitals:   08/15/15 1113  BP: 117/58  Pulse: 80  Temp: 98.2 F (36.8 C)  Resp: 18     Body mass index is 22.07 kg/(m^2).    ECOG FS: 0  Skin: warm, dry   HEENT: sclerae anicteric, conjunctivae pink, oropharynx clear. No thrush or mucositis.  Lymph Nodes: No cervical or supraclavicular lymphadenopathy  Lungs: clear to auscultation bilaterally, no rales, wheezes, or rhonci  Heart: regular rate and rhythm  Abdomen: round, soft, non tender, positive bowel sounds  Musculoskeletal: No focal spinal tenderness, no peripheral edema  Neuro: non focal, well oriented, positive affect  Breasts: deferred  LAB RESULTS: Lab Results  Component Value Date   WBC 13.2* 08/15/2015   NEUTROABS 11.9* 08/15/2015   HGB 11.6 08/15/2015   HCT 35.7 08/15/2015   MCV 89.6 08/15/2015   PLT 351 08/15/2015      Chemistry      Component Value Date/Time   NA 139 08/15/2015 1055   NA 139 03/10/2013 0848   K 4.2 08/15/2015 1055   K 3.9 03/10/2013 0848   CL 102 03/15/2013 0912   CL 100 03/10/2013 0848   CO2 24 08/15/2015 1055   CO2 23 03/10/2013 0848   BUN 18.3 08/15/2015 1055   BUN 20 03/10/2013 0848   CREATININE 0.8 08/15/2015 1055   CREATININE 0.69 03/10/2013 0848      Component Value Date/Time   CALCIUM 10.0 08/15/2015 1055   CALCIUM 10.0 03/10/2013 0848   ALKPHOS 166* 08/15/2015 1055   ALKPHOS 131* 03/10/2013 0848   AST 18 08/15/2015 1055   AST 29 03/10/2013 0848   ALT 68* 08/15/2015 1055   ALT 36* 03/10/2013 0848   BILITOT <0.30 08/15/2015 1055   BILITOT 0.3 03/10/2013 0848       Lab Results  Component Value Date   LABCA2 28 10/20/2012    No components found for: [id number]  No results for input(s): INR in the [name] 168 hours.  Urinalysis No results found for: COLORURINE  STUDIES: Ct Chest W Contrast  07/25/2015  CLINICAL DATA:  Subsequent treatment strategy for breast carcinoma.Breast carcinoma diagnosis 2012 and 2016. Recurrent RIGHT breastcance EXAM: CT CHEST WITH CONTRAST TECHNIQUE: Multidetector CT imaging of the chest was performed during intravenous contrast  administration. CONTRAST:  3m OMNIPAQUE IOHEXOL 300 MG/ML  SOLN  COMPARISON:  CT 06/13/2015, MRI 03/04/2011 FINDINGS: Mediastinum/Nodes: Postsurgical change consistent with RIGHT mastectomy. There stranding in the RIGHT axilla related to nodal dissection. No LEFT axillary adenopathy. No supraclavicular adenopathy. No internal mammary adenopathy. No mediastinal hilar adenopathy.  No pericardial fluid. Lungs/Pleura: Spiculated nodule in the RIGHT upper lobe measures 10 x 14 x 14 mm compared to 11 x 13 x 14 mm on prior remeasured. No interval change. No new pulmonary nodules. Upper abdomen: Limited view of the upper abdomen demonstrates multiple lesions within the liver. These are described as benign adenomas on MRI in 2012 and are not changed from recent CT. adrenal glands are normal. There are surgical clips along the RIGHT hepatic lobe. Musculoskeletal: No aggressive osseous lesion. IMPRESSION: 1. Stable spiculated nodule in the RIGHT upper lobe over 2 month interval. Recommend CT follow-up versus FDG PET scan. 2. Interval RIGHT mastectomy. 3. Stable lesions within liver compared with MRI of 2012. Electronically Signed   By: SSuzy BouchardM.D.   On: 07/25/2015 17:25    ASSESSMENT: 59y.o.  BRCA 1-2 and p-53 negative [place] woman status post right lumpectomy and sentinel lymph node sampling June 2012 for a T1a N0, Stage IA  invasive ductal carcinoma, grade 3, estrogen receptor 98% positive, progesterone receptor 97% positive, with an MIB-1 of 5% and no HER-2 amplification   (a) note that the initial Right breast biopsy showed DCIS w papillary architecture, estrogen receptor 70% positive, progesterone receptor negative  (1) completed radiation September 2012.   (2) on letrozole since July 2012   RECURRENT DISEASE: (3) right breast upper outer quadrant biopsy 05/23/2015 was positive for a clinical T1c N0, stage IA invasive ductal carcinoma, grade 2, estrogen receptor 60% positive with moderate staining intensity, progesterone receptor negative, with no HER-2  ossification and an MIB-1 of 90%.  (4) status post right simple mastectomy 07/05/2015 for a pT1c pN0, stage IA invasive ductal carcinoma, grade 3, with negative margins. 4 lymph nodes removed with mastectomy were benign.  (5) to begin cyclophosphamide and docetaxel q21 days x 4, with neulasta onpro given on day 2 for granulocyte support  (6) genetics testing 07/20/2015, results pending  (7) RUL spiculated nodule measuring 1.2 cm noted on staging CT scan 06/13/2015-- stable on repeat CT 07/25/15  PLAN:  SLovey Newcomeris much improved today. The labs were reviewed in detail and were relatively stable, with mild increases in her alk phos and ALT. We will continue to monitor these values as she has a history of hepatic adenomas. She will proceed with cycle 2 of cyclophosphamide and docetaxel as planned.   She will return in 1 week for a nadir visit. She understands and agrees with this plan. She knows the goal of treatment in her case is cure. She has been encouraged to call with any issues that might arise before her next visit here.   HGenelle GatherBoelter    08/15/2015

## 2015-08-17 ENCOUNTER — Other Ambulatory Visit: Payer: Self-pay | Admitting: *Deleted

## 2015-08-17 DIAGNOSIS — C50911 Malignant neoplasm of unspecified site of right female breast: Secondary | ICD-10-CM

## 2015-08-21 ENCOUNTER — Encounter: Payer: Self-pay | Admitting: Oncology

## 2015-08-21 ENCOUNTER — Ambulatory Visit (HOSPITAL_BASED_OUTPATIENT_CLINIC_OR_DEPARTMENT_OTHER): Payer: BLUE CROSS/BLUE SHIELD | Admitting: Oncology

## 2015-08-21 ENCOUNTER — Other Ambulatory Visit (HOSPITAL_BASED_OUTPATIENT_CLINIC_OR_DEPARTMENT_OTHER): Payer: BLUE CROSS/BLUE SHIELD

## 2015-08-21 VITALS — BP 110/56 | HR 90 | Temp 98.1°F | Resp 18 | Ht 66.0 in | Wt 136.8 lb

## 2015-08-21 DIAGNOSIS — K219 Gastro-esophageal reflux disease without esophagitis: Secondary | ICD-10-CM | POA: Diagnosis not present

## 2015-08-21 DIAGNOSIS — R911 Solitary pulmonary nodule: Secondary | ICD-10-CM

## 2015-08-21 DIAGNOSIS — C50911 Malignant neoplasm of unspecified site of right female breast: Secondary | ICD-10-CM

## 2015-08-21 DIAGNOSIS — C50411 Malignant neoplasm of upper-outer quadrant of right female breast: Secondary | ICD-10-CM

## 2015-08-21 LAB — COMPREHENSIVE METABOLIC PANEL (CC13)
ALBUMIN: 3.4 g/dL — AB (ref 3.5–5.0)
ALK PHOS: 146 U/L (ref 40–150)
ALT: 41 U/L (ref 0–55)
ANION GAP: 8 meq/L (ref 3–11)
AST: 23 U/L (ref 5–34)
BUN: 16.2 mg/dL (ref 7.0–26.0)
CALCIUM: 9.3 mg/dL (ref 8.4–10.4)
CO2: 25 meq/L (ref 22–29)
CREATININE: 0.9 mg/dL (ref 0.6–1.1)
Chloride: 103 mEq/L (ref 98–109)
EGFR: 74 mL/min/{1.73_m2} — ABNORMAL LOW (ref >90)
Glucose: 86 mg/dl (ref 70–140)
Potassium: 4.4 mEq/L (ref 3.5–5.1)
Sodium: 137 mEq/L (ref 136–145)
TOTAL PROTEIN: 5.9 g/dL — AB (ref 6.4–8.3)

## 2015-08-21 LAB — CBC WITH DIFFERENTIAL/PLATELET
BASO%: 1.2 % (ref 0.0–2.0)
Basophils Absolute: 0.1 10*3/uL (ref 0.0–0.1)
EOS ABS: 0.1 10*3/uL (ref 0.0–0.5)
EOS%: 2.4 % (ref 0.0–7.0)
HEMATOCRIT: 33.9 % — AB (ref 34.8–46.6)
HGB: 11.2 g/dL — ABNORMAL LOW (ref 11.6–15.9)
LYMPH#: 0.8 10*3/uL — AB (ref 0.9–3.3)
LYMPH%: 12.3 % — ABNORMAL LOW (ref 14.0–49.7)
MCH: 29.3 pg (ref 25.1–34.0)
MCHC: 33 g/dL (ref 31.5–36.0)
MCV: 88.9 fL (ref 79.5–101.0)
MONO#: 0.7 10*3/uL (ref 0.1–0.9)
MONO%: 11.3 % (ref 0.0–14.0)
NEUT%: 72.8 % (ref 38.4–76.8)
NEUTROS ABS: 4.6 10*3/uL (ref 1.5–6.5)
PLATELETS: 263 10*3/uL (ref 145–400)
RBC: 3.81 10*6/uL (ref 3.70–5.45)
RDW: 14.4 % (ref 11.2–14.5)
WBC: 6.3 10*3/uL (ref 3.9–10.3)

## 2015-08-21 NOTE — Progress Notes (Signed)
ID: Crystal [name]   DOB: August 24, 1956  MR#: [id number]  CSN#:[id number]  PCP: Kandice Hams, MD GYN: SU: [name] MD OTHER MD: Earle Gell, Daneen Schick, Arnoldo Hooker Th[place]ma[place]]  CHIEF COMPLAINT: recurrent estrogen receptor positive breast cancer  CURRENT TREATMENT: Awaiting definitive surgery  BREAST CANCER HISTORY:   From the original intake note:  The patient had bilateral diagnostic mammography 02/12/2011.  She has a history of cystic changes noted on prior studies in April 2011 and April 2010.  On the Feb 12, 2011 study there were new calcifications identified superiorly in the right breast, without any obvious mass.  The patient was brought back for further studies, 02/19/2011 and Dr. Marcelo Baldy was able to localize the calcifications under stereotactic guidance.  The biopsy ([id number]) showed high-grade ductal carcinoma with papillary features which was 70% estrogen receptor positive, 0% progesterone receptor positive.  There was no definitive invasion and therefore further studies were not performed.   Bilateral breast MRIs were obtained Feb 26, 2011.  This showed marked nodular enhancement throughout both breasts, and in the upper central portion there was a post biopsy area of change measuring up to 3.7 cm.  Most of this is a fluid cavity with a thin rim of enhancement.  There were no suspicious areas in the left breast.  No internal mammary or axillary lymph nodes.  Of course, the patient has a history of hepatic adenomas and these were noted incidentally on the MRI. Her definitive surgical treatment and subsequent history are as detailed below.  INTERVAL HISTORY: [name] returns today for follow up of her now recurrent breast cancer. Today is day 7, cycle 2 of cyclophosphamide and docetaxel, with neulasta onpro given on day 2 for granulocyte support.  REVIEW OF SYSTEMS: Konnor is feeling better with her second cycle of chemo. She tool tramadol 3 times and had very little bone  pain. She is moving her bowels well. The irritation to her eyes has cleared with Clear Eyes drops. She denies fevers, chills, nausea, or vomiting. Her reflux has improved on OTC omeprazole. She denies mouth sores, rashes, or neuropathy symptoms. Her energy level is good. A detailed review of systems is otherwise stable.  PAST MEDICAL HISTORY: Past Medical History  Diagnosis Date  . History of small bowel obstruction   . Liver masses   . Insomnia   . GERD (gastroesophageal reflux disease)   . Complication of anesthesia     hard to wake up  . Cancer [facility])     right breast  history of small-bowel obstruction with partial colectomy March 2001.  The patient has a history of hepatic adenomas.  She has had liver resections x2.  The data for this is at [facility] and we will try to obtain it.  She has also had incisional hernia repair.  She has a history of osteopenia, and she has a history of multiple anti-red cell antibodies making a transfusion difficult.  The patient tells me that she has a history of low calcium and a tendency to get liver injections.  PAST SURGICAL HISTORY: Past Surgical History  Procedure Laterality Date  . Liver surgery    . Laparotomies    . Hernia repair      incisional hernia repair x3  . Breast surgery Right   . Mastectomy w/ sentinel node biopsy Right 07/05/2015    Procedure: RIGHT MASTECTOMY WITH SENTINEL NODE BIOPSY AND PORT A CATH INSERTION;  Surgeon: [name], MD;  Location: [place];  Service: General;  Laterality: Right;  . Portacath placement Left 07/05/2015    Procedure: INSERTION PORT-A-CATH;  Surgeon: [name], MD;  Location: [place];  Service: General;  Laterality: Left;    FAMILY HISTORY Family History  Problem Relation Age of Onset  . Ovarian cancer Mother 83    "metastasized to breasts"  . Breast cancer Maternal Grandmother 48    2-3 recurrences  . Stomach cancer Maternal Grandfather     dx. 47-49;  metastatic stomach cancer  . Colon polyps Father     about 2-3 polyps removed at each colonoscopy  . Colon polyps Brother     "a few"  . Other Maternal Aunt     +TAH  . Colon cancer Paternal Aunt 90  . Brain cancer Daughter 17    astrocytoma  . Breast cancer Cousin     dx. 39s  The patient's mother died from ovarian cancer at the age of 32.  The patient's father is alive at age 1.  She has 2 brothers, no sisters.  A maternal grandmother was diagnosed with breast cancer at the age of 32, and the maternal grandfather had stomach cancer at the age of 64.  GYNECOLOGIC HISTORY: The patient had menarche at age 45.  Her [name] menstrual period was in 2005.  She never used hormone replacement.  She used birth control briefly to control bleeding prior to 1980.  She was 59 years old at the birth of her first child.  She started having mammograms at age 19 because of a strong family history.  SOCIAL HISTORY: Bonni works as a Programmer, applications for [employer].   She also heads the local "[employer]" Her husband, Tegan Britain, is a retired Engineer, maintenance from a [employer] locally.  Son, [name], lives in [place] and works for that [employer]  Daughter, [name], lives in [place] and is a homemaker.   ADVANCED DIRECTIVES: in place  HEALTH MAINTENANCE: Social History  Substance Use Topics  . Smoking status: Never Smoker   . Smokeless tobacco: Never Used  . Alcohol Use: 0.6 oz/week    1 Glasses of wine per week     Comment: social     Colonoscopy:  PAP:  Bone density:  Lipid panel:  Allergies  Allergen Reactions  . Promethazine Hcl Other (See Comments)    Causes shaking  . Tylenol [Acetaminophen]     Avoids Tylenol products due to liver disease   . Penicillins Rash    Current Outpatient Prescriptions  Medication Sig Dispense Refill  . b complex vitamins tablet Take 1 tablet by mouth daily.      . calcium-vitamin D (OSCAL WITH D) 500-200  MG-UNIT per tablet Take 2 tablets by mouth daily.    . Carboxymethylcellul-Glycerin (CLEAR EYES FOR DRY EYES OP) Apply 1 drop to eye as needed.    Marland Kitchen dexamethasone (DECADRON) 4 MG tablet Take 2 tablets (8 mg total) by mouth 2 (two) times daily. Start the day before Taxotere. Then again the day after chemo for 3 days. 30 tablet 1  . famotidine (PEPCID AC) 10 MG chewable tablet Chew 10 mg by mouth daily as needed for heartburn.    . fish oil-omega-3 fatty acids 1000 MG capsule Take 2 g by mouth daily.      Marland Kitchen glucosamine-chondroitin 500-400 MG tablet Take 1 tablet by mouth 3 (three) times daily.      Marland Kitchen ibuprofen (ADVIL,MOTRIN) 200 MG tablet Take 200 mg by  mouth every 6 (six) hours as needed.    . lidocaine-prilocaine (EMLA) cream Apply to affected area once 30 g 3  . LORazepam (ATIVAN) 0.5 MG tablet Take 1 tablet (0.5 mg total) by mouth at bedtime. 30 tablet 0  . Multiple Vitamins-Minerals (MULTIVITAMIN WITH MINERALS) tablet Take 1 tablet by mouth daily.      . ondansetron (ZOFRAN) 8 MG tablet Take 1 tablet (8 mg total) by mouth 2 (two) times daily. Start the day after chemo for 3 days. Then take as needed for nausea or vomiting. 30 tablet 1  . prochlorperazine (COMPAZINE) 10 MG tablet Take 1 tablet (10 mg total) by mouth every 6 (six) hours as needed (Nausea or vomiting). 30 tablet 1  . tobramycin-dexamethasone (TOBRADEX) ophthalmic solution Place 1 drop into both eyes 2 (two) times daily. 5 mL 1  . traMADol (ULTRAM) 50 MG tablet Take 1 tablet (50 mg total) by mouth every 6 (six) hours as needed. 60 tablet 2  . TURMERIC PO Take 400 mg by mouth daily. Pt takes 2 tablets 400 mg each= 800 mg total     No current facility-administered medications for this visit.    OBJECTIVE: Middle-aged white woman who appears younger than stated age  59 Vitals:   08/21/15 1122  BP: 110/56  Pulse: 90  Temp: 98.1 F (36.7 C)  Resp: 18     Body mass index is 22.09 kg/(m^2).    ECOG FS: 0  Skin: warm, dry   HEENT: sclerae anicteric, conjunctivae pink, oropharynx clear. No thrush or mucositis.  Lymph Nodes: No cervical or supraclavicular lymphadenopathy  Lungs: clear to auscultation bilaterally, no rales, wheezes, or rhonci  Heart: regular rate and rhythm  Abdomen: round, soft, non tender, positive bowel sounds  Musculoskeletal: No focal spinal tenderness, no peripheral edema  Neuro: non focal, well oriented, positive affect  Breasts: deferred  LAB RESULTS: Lab Results  Component Value Date   WBC 6.3 08/21/2015   NEUTROABS 4.6 08/21/2015   HGB 11.2* 08/21/2015   HCT 33.9* 08/21/2015   MCV 88.9 08/21/2015   PLT 263 08/21/2015      Chemistry      Component Value Date/Time   NA 137 08/21/2015 1110   NA 139 03/10/2013 0848   K 4.4 08/21/2015 1110   K 3.9 03/10/2013 0848   CL 102 03/15/2013 0912   CL 100 03/10/2013 0848   CO2 25 08/21/2015 1110   CO2 23 03/10/2013 0848   BUN 16.2 08/21/2015 1110   BUN 20 03/10/2013 0848   CREATININE 0.9 08/21/2015 1110   CREATININE 0.69 03/10/2013 0848      Component Value Date/Time   CALCIUM 9.3 08/21/2015 1110   CALCIUM 10.0 03/10/2013 0848   ALKPHOS 146 08/21/2015 1110   ALKPHOS 131* 03/10/2013 0848   AST 23 08/21/2015 1110   AST 29 03/10/2013 0848   ALT 41 08/21/2015 1110   ALT 36* 03/10/2013 0848   BILITOT <0.30 08/21/2015 1110   BILITOT 0.3 03/10/2013 0848       Lab Results  Component Value Date   LABCA2 28 10/20/2012    No components found for: [id number]  No results for input(s): INR in the [name] 168 hours.  Urinalysis No results found for: COLORURINE  STUDIES: Ct Chest W Contrast  07/25/2015  CLINICAL DATA:  Subsequent treatment strategy for breast carcinoma.Breast carcinoma diagnosis 2012 and 2016. Recurrent RIGHT breastcance EXAM: CT CHEST WITH CONTRAST TECHNIQUE: Multidetector CT imaging of the chest was performed during intravenous contrast  administration. CONTRAST:  33m OMNIPAQUE IOHEXOL 300 MG/ML  SOLN  COMPARISON:  CT 06/13/2015, MRI 03/04/2011 FINDINGS: Mediastinum/Nodes: Postsurgical change consistent with RIGHT mastectomy. There stranding in the RIGHT axilla related to nodal dissection. No LEFT axillary adenopathy. No supraclavicular adenopathy. No internal mammary adenopathy. No mediastinal hilar adenopathy.  No pericardial fluid. Lungs/Pleura: Spiculated nodule in the RIGHT upper lobe measures 10 x 14 x 14 mm compared to 11 x 13 x 14 mm on prior remeasured. No interval change. No new pulmonary nodules. Upper abdomen: Limited view of the upper abdomen demonstrates multiple lesions within the liver. These are described as benign adenomas on MRI in 2012 and are not changed from recent CT. adrenal glands are normal. There are surgical clips along the RIGHT hepatic lobe. Musculoskeletal: No aggressive osseous lesion. IMPRESSION: 1. Stable spiculated nodule in the RIGHT upper lobe over 2 month interval. Recommend CT follow-up versus FDG PET scan. 2. Interval RIGHT mastectomy. 3. Stable lesions within liver compared with MRI of 2012. Electronically Signed   By: SSuzy BouchardM.D.   On: 07/25/2015 17:25    ASSESSMENT: 59y.o.  BRCA 1-2 and p-53 negative [place] woman status post right lumpectomy and sentinel lymph node sampling June 2012 for a T1a N0, Stage IA  invasive ductal carcinoma, grade 3, estrogen receptor 98% positive, progesterone receptor 97% positive, with an MIB-1 of 5% and no HER-2 amplification   (a) note that the initial Right breast biopsy showed DCIS w papillary architecture, estrogen receptor 70% positive, progesterone receptor negative  (1) completed radiation September 2012.   (2) on letrozole since July 2012   RECURRENT DISEASE: (3) right breast upper outer quadrant biopsy 05/23/2015 was positive for a clinical T1c N0, stage IA invasive ductal carcinoma, grade 2, estrogen receptor 60% positive with moderate staining intensity, progesterone receptor negative, with no HER-2  ossification and an MIB-1 of 90%.  (4) status post right simple mastectomy 07/05/2015 for a pT1c pN0, stage IA invasive ductal carcinoma, grade 3, with negative margins. 4 lymph nodes removed with mastectomy were benign.  (5) to begin cyclophosphamide and docetaxel q21 days x 4, with neulasta onpro given on day 2 for granulocyte support  (6) genetics testing 07/20/2015, results pending  (7) RUL spiculated nodule measuring 1.2 cm noted on staging CT scan 06/13/2015-- stable on repeat CT 07/25/15  PLAN:  SLovey Newcomeris much improved today. The labs were reviewed in detail and were relatively stable. Her alk phos and ALT have normalized.    She will return in 2 weeks prior to cycle #3. She understands and agrees with this plan. She knows the goal of treatment in her case is cure. She has been encouraged to call with any issues that might arise before her next visit here.   CMikey Bussing   08/21/2015

## 2015-08-24 ENCOUNTER — Ambulatory Visit: Payer: BLUE CROSS/BLUE SHIELD | Attending: Oncology

## 2015-08-24 DIAGNOSIS — Z9011 Acquired absence of right breast and nipple: Secondary | ICD-10-CM | POA: Insufficient documentation

## 2015-08-24 DIAGNOSIS — Z9189 Other specified personal risk factors, not elsewhere classified: Secondary | ICD-10-CM | POA: Diagnosis present

## 2015-08-24 DIAGNOSIS — M25611 Stiffness of right shoulder, not elsewhere classified: Secondary | ICD-10-CM | POA: Insufficient documentation

## 2015-08-24 NOTE — Therapy (Signed)
Crystal [name], [place], [address] Phone: [phone]   Fax:  [phone]  Physical Therapy Treatment  Patient Details  Name: Crystal [name] MRN: [id number] Date of Birth: 1955/10/19 Referring Provider: Dr. Lurline Del  Encounter Date: 08/24/2015      PT End of Session - 08/24/15 1110    Visit Number 3   Number of Visits 8   Date for PT Re-Evaluation 09/18/15  Pt will take 7-10 days off after chemo treatments.    PT Start Time 1023   PT Stop Time 1109   PT Time Calculation (min) 46 min   Activity Tolerance Patient tolerated treatment well   Behavior During Therapy WFL for tasks assessed/performed      Past Medical History  Diagnosis Date  . History of small bowel obstruction   . Liver masses   . Insomnia   . GERD (gastroesophageal reflux disease)   . Complication of anesthesia     hard to wake up  . Cancer [facility])     right breast    Past Surgical History  Procedure Laterality Date  . Liver surgery    . Laparotomies    . Hernia repair      incisional hernia repair x3  . Breast surgery Right   . Mastectomy w/ sentinel node biopsy Right 07/05/2015    Procedure: RIGHT MASTECTOMY WITH SENTINEL NODE BIOPSY AND PORT A CATH INSERTION;  Surgeon: [name], MD;  Location: [place];  Service: General;  Laterality: Right;  . Portacath placement Left 07/05/2015    Procedure: INSERTION PORT-A-CATH;  Surgeon: [name], MD;  Location: [place];  Service: General;  Laterality: Left;    There were no vitals filed for this visit.  Visit Diagnosis:  S/P mastectomy, right  Shoulder stiffness, right  At risk for lymphedema      Subjective Assessment - 08/24/15 1026    Subjective Had some bone pain yesterday from the neulasta which was later than it happened last time so I was hoping I was out of the woods this round but I'm not hurting now so that's  an improvement. I felt great after last session and I feel like I've maintained since then.    Currently in Pain? No/denies            [facility] PT Assessment - 08/24/15 0001    AROM   Right Shoulder Flexion 144 Degrees   Right Shoulder ABduction 134 Degrees                     [facility] Adult PT Treatment/Exercise - 08/24/15 0001    Shoulder Exercises: Pulleys   Flexion 2 minutes   ABduction 2 minutes   Shoulder Exercises: Therapy Ball   Flexion 10 reps   ABduction 5 reps   Shoulder Exercises: ROM/Strengthening   "W" Arms Standing with back against wall 5 reps for 5 second holds   Other ROM/Strengthening Exercises Doorway Pectoralis Stretch 3 reps for 10 second holds   Manual Therapy   Myofascial Release At Rt chest wall to pts tolerance   Passive ROM To Rt shoulder into flexion, abduction, D2, and neural tension stretch in supine                        [facility] Goals - 08/24/15 1121    CC Long Term Goal  #1   Title Patient will be independent  with a home exercise program for shoulder ROM and strengthening  Independent with shoulder ROM HEP   Status Partially Met   CC Long Term Goal  #2   Title Patient will be able to verbalize understanding of lymphedema risk reduction practices   Status On-going   CC Long Term Goal  #3   Title Increase right shoulder active flexion to >/= 160 degrees for increased ease reaching overhead  144 degrees attained thus far 08/24/15   Status On-going   CC Long Term Goal  #4   Title Increase right shoulder abduction to >/= 160 degrees for increased ease reaching  134 degrees attained thus far 08/24/15   Status On-going   CC Long Term Goal  #5   Title Report she is able to reach overhead without complaints of increased pain in right axilla or shoulder.  Pt has noticed her reaching with ADLs is improved and with less pain now   Status Partially Met            Plan - 08/24/15 1115    Clinical Impression  Statement Pt had no pain with end range PROM stretching today and had good improvements with her AROM measurements as well. Pt is progressing well with therapy and towards her goals.    Pt will benefit from skilled therapeutic intervention in order to improve on the following deficits Decreased strength;Decreased knowledge of precautions;Pain;Impaired UE functional use;Decreased range of motion   Rehab Potential Excellent   Clinical Impairments Affecting Rehab Potential none   PT Frequency 2x / week   PT Duration 6 weeks   PT Treatment/Interventions Therapeutic exercise;Manual techniques;Passive range of motion;Patient/family education;Scar mobilization;ADLs/Self Care Home Management   PT Next Visit Plan ROM exercises, PROM; Gentle scar mobilization.   Recommended Other Services Pt to atttend ABC class on 09/04/15.   Consulted and Agree with Plan of Care Patient        Problem List Patient Active Problem List   Diagnosis Date Noted  . Genetic testing 08/11/2015  . Recurrent cancer of right breast ([place]) 06/05/2015  . Breast cancer, right breast ([place]) 03/16/2012    Otelia Limes, PTA 08/24/2015, 11:23 AM  [place], [place], [address] Phone: [phone]   Fax:  [phone]  Name: Crystal [name] MRN: [id number] Date of Birth: 30-Mar-1956

## 2015-08-28 ENCOUNTER — Ambulatory Visit: Payer: BLUE CROSS/BLUE SHIELD | Admitting: Physical Therapy

## 2015-08-28 DIAGNOSIS — M25611 Stiffness of right shoulder, not elsewhere classified: Secondary | ICD-10-CM

## 2015-08-28 DIAGNOSIS — Z9011 Acquired absence of right breast and nipple: Secondary | ICD-10-CM

## 2015-08-28 NOTE — Therapy (Signed)
[place], [place], [address] Phone: [phone]   Fax:  [phone]  Physical Therapy Treatment  Patient Details  Name: Crystal [name] MRN: [id number] Date of Birth: [birth year] Referring Provider: Dr. Lurline Del  Encounter Date: 08/28/2015      PT End of Session - 08/28/15 1601    Visit Number 4   Number of Visits 8   Date for PT Re-Evaluation 09/18/15   PT Start Time 1514   PT Stop Time 1601   PT Time Calculation (min) 47 min   Activity Tolerance Patient tolerated treatment well   Behavior During Therapy [facility] for tasks assessed/performed      Past Medical History  Diagnosis Date  . History of small bowel obstruction   . Liver masses   . Insomnia   . GERD (gastroesophageal reflux disease)   . Complication of anesthesia     hard to wake up  . Cancer [facility])     right breast    Past Surgical History  Procedure Laterality Date  . Liver surgery    . Laparotomies    . Hernia repair      incisional hernia repair x3  . Breast surgery Right   . Mastectomy w/ sentinel node biopsy Right 07/05/2015    Procedure: RIGHT MASTECTOMY WITH SENTINEL NODE BIOPSY AND PORT A CATH INSERTION;  Surgeon: [name], MD;  Location: [place];  Service: General;  Laterality: Right;  . Portacath placement Left 07/05/2015    Procedure: INSERTION PORT-A-CATH;  Surgeon: [name], MD;  Location: [place];  Service: General;  Laterality: Left;    There were no vitals filed for this visit.  Visit Diagnosis:  Shoulder stiffness, right  S/P mastectomy, right      Subjective Assessment - 08/28/15 1518    Subjective Chemo side effects were delayed this second round compared to the first.  Feels like her insides are sore.  Also headachey.   Currently in Pain? Yes   Pain Score 2    Pain Location Other (Comment)  "my insides" and headache   Pain Orientation Other  (Comment)  insides   Pain Descriptors / Indicators Sore   Aggravating Factors  chemo   Pain Relieving Factors ibuprofen                         [facility] Adult PT Treatment/Exercise - 08/28/15 0001    Shoulder Exercises: Supine   Other Supine Exercises In hooklying with arms outstretched, lower trunk rotation to feel chest/anterior shoulder stretch x 2 for right, x 1 for left.30 seconds each.   Other Supine Exercises over towel roll, active bilat. D2 motions x 10   Shoulder Exercises: Pulleys   Flexion 2 minutes   ABduction 2 minutes   Shoulder Exercises: Therapy Ball   Flexion 10 reps  with hold at top   Shoulder Exercises: ROM/Strengthening   "W" Arms Standing with back against wall 5 reps for 5 second holds   Other ROM/Strengthening Exercises Doorway Pectoralis Stretch 3 reps for 10 second holds   Other ROM/Strengthening Exercises Finger ladder for right shoulder abduction x 5   Manual Therapy   Myofascial Release At right chest wall in vertical with crosshands technique; right UE myofascial pulling in supine up to patient's tolerance into abduction.   Passive ROM Supine over towel roll, passive pect minor stretches x 20 seconds x 2.; right shoulder passive flexion.  [facility] Goals - 08/24/15 1121    CC Long Term Goal  #1   Title Patient will be independent with a home exercise program for shoulder ROM and strengthening  Independent with shoulder ROM HEP   Status Partially Met   CC Long Term Goal  #2   Title Patient will be able to verbalize understanding of lymphedema risk reduction practices   Status On-going   CC Long Term Goal  #3   Title Increase right shoulder active flexion to >/= 160 degrees for increased ease reaching overhead  144 degrees attained thus far 08/24/15   Status On-going   CC Long Term Goal  #4   Title Increase right shoulder abduction to >/= 160 degrees for increased ease reaching  134  degrees attained thus far 08/24/15   Status On-going   CC Long Term Goal  #5   Title Report she is able to reach overhead without complaints of increased pain in right axilla or shoulder.  Pt has noticed her reaching with ADLs is improved and with less pain now   Status Partially Met            Plan - 08/28/15 1656    Clinical Impression Statement Did well with exercise and with addition of three exercises to HEP; limited by discomfort for some manual stretching/myofascial release especially with right arm into abduction.     Pt will benefit from skilled therapeutic intervention in order to improve on the following deficits Decreased strength;Decreased knowledge of precautions;Pain;Impaired UE functional use;Decreased range of motion   Rehab Potential Excellent   PT Frequency 2x / week   PT Duration 6 weeks   PT Treatment/Interventions Therapeutic exercise;Manual techniques;Passive range of motion   PT Next Visit Plan Remeasure; review HEP as needed; manual techniques for right chest and shoulder.   Consulted and Agree with Plan of Care Patient        Problem List Patient Active Problem List   Diagnosis Date Noted  . Genetic testing 08/11/2015  . Recurrent cancer of right breast ([place]) 06/05/2015  . Breast cancer, right breast ([place]) 03/16/2012    SALISBURY,DONNA 08/28/2015, 4:59 PM  [place], [place], [address] Phone: [phone]   Fax:  [phone]  Name: Crystal [name] MRN: [id number] Date of Birth: August 01, 1956    Serafina Royals, PT 08/28/2015 4:59 PM

## 2015-08-28 NOTE — Patient Instructions (Addendum)
CHEST: Doorway, Bilateral - Standing    Standing in doorway, place hands on wall with elbows bent at shoulder height. STEP forward SO THAT ONE FOOT IS FORWARD AND YOU ARE IN A SLIGHT LUNGE POSITION. Hold __10_ seconds. __3_ reps per set, _2-3__ sets per day, _7__ days per week  Copyright  VHI. All rights reserved.   Lower Trunk Rotation    Bring both knees into a bent position so that you put your feet flat on the bed or floor. Rotate from side to side, keeping knees together and feet on floor.  Hold for 30 seconds (mainly work on knees to left to feel stretch at right chest). Repeat __1-2__ times per set. Do __1__ sets per session. Do __1-3__ sessions per day.    LIE ON YOUR BACK OVER A ROLLED UP TOWEL, WITH THE TOWEL UNDER YOUR SPINE.  Start with hands at opposite side hips, then lift arms up and out into a "V" position.  Pause at the top where you feel a stretch, then bring hands back down to starting point.  Repeat 10 times, 1-3 times a day.  http://orth.exer.us/151   Copyright  VHI. All rights reserved.

## 2015-08-29 ENCOUNTER — Telehealth: Payer: Self-pay | Admitting: *Deleted

## 2015-08-29 NOTE — Telephone Encounter (Signed)
Received message from patient 11/14 stating she has a hard lump on her vulva.  Left her a message to see her GYN for evaluation.  She returned my call today and stated her MD stated that the area was an abscess and gave her antibiotics (Bactrim).  She is supposed to return to her GYN later in the week to assess.

## 2015-08-31 ENCOUNTER — Ambulatory Visit: Payer: BLUE CROSS/BLUE SHIELD

## 2015-08-31 DIAGNOSIS — Z9011 Acquired absence of right breast and nipple: Secondary | ICD-10-CM

## 2015-08-31 DIAGNOSIS — Z9189 Other specified personal risk factors, not elsewhere classified: Secondary | ICD-10-CM

## 2015-08-31 DIAGNOSIS — M25611 Stiffness of right shoulder, not elsewhere classified: Secondary | ICD-10-CM

## 2015-08-31 NOTE — Therapy (Signed)
[place], [place], [address] Phone: [phone]   Fax:  [phone]  Physical Therapy Treatment  Patient Details  Name: Crystal [name] MRN: [id number] Date of Birth: 03/26/1956 Referring Provider: Dr. Lurline Del  Encounter Date: 08/31/2015      PT End of Session - 08/31/15 1158    Visit Number 5   Number of Visits 8   Date for PT Re-Evaluation 09/18/15   PT Start Time 1111   PT Stop Time 1155   PT Time Calculation (min) 44 min   Activity Tolerance Patient tolerated treatment well   Behavior During Therapy [facility] for tasks assessed/performed      Past Medical History  Diagnosis Date  . History of small bowel obstruction   . Liver masses   . Insomnia   . GERD (gastroesophageal reflux disease)   . Complication of anesthesia     hard to wake up  . Cancer [facility])     right breast    Past Surgical History  Procedure Laterality Date  . Liver surgery    . Laparotomies    . Hernia repair      incisional hernia repair x3  . Breast surgery Right   . Mastectomy w/ sentinel node biopsy Right 07/05/2015    Procedure: RIGHT MASTECTOMY WITH SENTINEL NODE BIOPSY AND PORT A CATH INSERTION;  Surgeon: [name], MD;  Location: [place];  Service: General;  Laterality: Right;  . Portacath placement Left 07/05/2015    Procedure: INSERTION PORT-A-CATH;  Surgeon: [name], MD;  Location: [place];  Service: General;  Laterality: Left;    There were no vitals filed for this visit.  Visit Diagnosis:  Shoulder stiffness, right  S/P mastectomy, right  At risk for lymphedema      Subjective Assessment - 08/31/15 1114    Subjective I got a vulvular abcess that I first noticed Monday night and saw the doctor Tuesday and she started me on Bactrim but I dont remember the mg. It opened up yesterday and now it's starting to feel better. My Rt shoulder just keeps  getting better. Out to the side reaching is still limited but all keeps getting better.    Currently in Pain? No/denies            [facility] PT Assessment - 08/31/15 0001    AROM   Right Shoulder Flexion 160 Degrees   Right Shoulder ABduction 174 Degrees                     [facility] Adult PT Treatment/Exercise - 08/31/15 0001    Shoulder Exercises: Pulleys   Flexion 3 minutes   ABduction 2 minutes   Shoulder Exercises: Therapy Ball   Flexion 10 reps  With hold at top   Shoulder Exercises: ROM/Strengthening   "W" Arms Standing with back against wall 5 reps for 5 second holds   Other ROM/Strengthening Exercises Doorway Pectoralis Stretch 3 reps for 10 second holds   Other ROM/Strengthening Exercises Finger ladder for right shoulder abduction x 5 with slight "hang" at end of stretch   Manual Therapy   Myofascial Release At right chest wall in vertical with crosshands technique; right UE myofascial pulling in supine up to patient's tolerance into abduction.   Passive ROM Supine for Rt shoulder flexion and abduction  [facility] Goals - 08/31/15 1200    CC Long Term Goal  #1   Title Patient will be independent with a home exercise program for shoulder ROM and strengthening   Status Partially Met   CC Long Term Goal  #2   Title Patient will be able to verbalize understanding of lymphedema risk reduction practices  Began instructin gp tin this today and she will be attending the ABC class on Monday 09/04/15   Status Partially Met   CC Long Term Goal  #3   Title Increase right shoulder active flexion to >/= 160 degrees for increased ease reaching overhead  160 degrees attained today 08/31/15   Status Achieved   CC Long Term Goal  #4   Title Increase right shoulder abduction to >/= 160 degrees for increased ease reaching  174 degrees attained today 08/31/15   CC Long Term Goal  #5   Title Report she is able to reach overhead  without complaints of increased pain in right axilla or shoulder.  Has an occassional twinge with higher reaching, but no longer constant   Status Partially Met            Plan - 08/31/15 1158    Clinical Impression Statement Pt was not limited with PROM today reporting it felt good and her AROM abduction is WNLs and AROM has iproved greatly as well. She is making excellent progress overall and plans to attend the ABC class on Monday.   Pt will benefit from skilled therapeutic intervention in order to improve on the following deficits Decreased strength;Decreased knowledge of precautions;Pain;Impaired UE functional use;Decreased range of motion   Rehab Potential Excellent   Clinical Impairments Affecting Rehab Potential none   PT Frequency 2x / week   PT Duration 6 weeks   PT Treatment/Interventions Therapeutic exercise;Manual techniques;Passive range of motion   PT Next Visit Plan Review HEP as needed; manual techniques for right chest and shoulder.   Recommended Other Services Faxed script to Dr. Jana Hakim for compression sleeve class I as pt would like this for preventative measures   Consulted and Agree with Plan of Care Patient        Problem List Patient Active Problem List   Diagnosis Date Noted  . Genetic testing 08/11/2015  . Recurrent cancer of right breast ([place]) 06/05/2015  . Breast cancer, right breast ([place]) 03/16/2012    Otelia Limes, PTA 08/31/2015, 12:04 PM  [place], [place], [address] Phone: [phone]   Fax:  [phone]  Name: Crystal [name] MRN: [id number] Date of Birth: 1956/06/25

## 2015-09-04 ENCOUNTER — Other Ambulatory Visit: Payer: Self-pay | Admitting: *Deleted

## 2015-09-04 ENCOUNTER — Ambulatory Visit: Payer: BLUE CROSS/BLUE SHIELD | Admitting: Physical Therapy

## 2015-09-04 DIAGNOSIS — Z9189 Other specified personal risk factors, not elsewhere classified: Secondary | ICD-10-CM

## 2015-09-04 DIAGNOSIS — Z9011 Acquired absence of right breast and nipple: Secondary | ICD-10-CM | POA: Diagnosis not present

## 2015-09-04 DIAGNOSIS — C50911 Malignant neoplasm of unspecified site of right female breast: Secondary | ICD-10-CM

## 2015-09-04 DIAGNOSIS — M25611 Stiffness of right shoulder, not elsewhere classified: Secondary | ICD-10-CM

## 2015-09-04 NOTE — Therapy (Signed)
[place], [place], [address] Phone: [phone]   Fax:  [phone]  Physical Therapy Treatment  Patient Details  Name: Crystal [name] MRN: [id number] Date of Birth: 04/15/1956 Referring Provider: Dr. Lurline Del  Encounter Date: 09/04/2015      PT End of Session - 09/04/15 2054    Visit Number 6   Number of Visits 8   Date for PT Re-Evaluation 09/18/15   PT Start Time 1301   PT Stop Time 4268   PT Time Calculation (min) 46 min   Activity Tolerance Patient tolerated treatment well   Behavior During Therapy [facility] for tasks assessed/performed      Past Medical History  Diagnosis Date  . History of small bowel obstruction   . Liver masses   . Insomnia   . GERD (gastroesophageal reflux disease)   . Complication of anesthesia     hard to wake up  . Cancer [facility])     right breast    Past Surgical History  Procedure Laterality Date  . Liver surgery    . Laparotomies    . Hernia repair      incisional hernia repair x3  . Breast surgery Right   . Mastectomy w/ sentinel node biopsy Right 07/05/2015    Procedure: RIGHT MASTECTOMY WITH SENTINEL NODE BIOPSY AND PORT A CATH INSERTION;  Surgeon: [name], MD;  Location: [place];  Service: General;  Laterality: Right;  . Portacath placement Left 07/05/2015    Procedure: INSERTION PORT-A-CATH;  Surgeon: [name], MD;  Location: [place];  Service: General;  Laterality: Left;    There were no vitals filed for this visit.  Visit Diagnosis:  Shoulder stiffness, right  At risk for lymphedema      Subjective Assessment - 09/04/15 1305    Subjective They extended my antibiotic, but I'm doing so much better.  I go back to the doctor this Friday.  Arm seems to get a little bit better all the time.     Currently in Pain? No/denies                         [facility] Adult PT  Treatment/Exercise - 09/04/15 0001    Shoulder Exercises: Pulleys   Flexion 2 minutes   ABduction 2 minutes   Shoulder Exercises: Therapy Ball   Flexion 10 reps  With hold at top   Shoulder Exercises: ROM/Strengthening   Other ROM/Strengthening Exercises Finger ladder for right shoulder abduction x 5 with slight "hang" at end of stretch   Manual Therapy   Manual Therapy Scapular mobilization;Other (comment);Neural Stretch   Myofascial Release At right chest wall in vertical and horizontal with crosshands technique; right UE myofascial pulling in supine up to patient's tolerance into abduction, in supine; also in left sidelying, crosshands at right flank with other hand at right arm in flexion.   Scapular Mobilization of right side in left sidelying, for protraction and depression   Passive ROM Supine for Rt shoulder flexion and abduction with stroking of tight tendons at axilla.  Supine over towel roll with passive pect minor stretches.   Neural Stretch right UE                        [facility] Goals - 09/04/15 1310    CC Long Term Goal  #1   Title Patient will be independent with  a home exercise program for shoulder ROM and strengthening   Status Partially Met   CC Long Term Goal  #2   Title Patient will be able to verbalize understanding of lymphedema risk reduction practices   Status Achieved   CC Long Term Goal  #4   Title Increase right shoulder abduction to >/= 160 degrees for increased ease reaching   Status On-going   CC Long Term Goal  #5   Title Report she is able to reach overhead without complaints of increased pain in right axilla or shoulder.   Status Partially Met            Plan - 09/04/15 2054    Clinical Impression Statement Patient did attend ABC class today and has met lymphedema risk reduction knowledge goal.  She continues to have mild tightness helped by manual techniques.   Pt will benefit from skilled therapeutic intervention in  order to improve on the following deficits Decreased strength;Decreased knowledge of precautions;Pain;Impaired UE functional use;Decreased range of motion   Rehab Potential Excellent   PT Frequency 2x / week   PT Duration 6 weeks   PT Treatment/Interventions Therapeutic exercise;Manual techniques;Passive range of motion   PT Next Visit Plan Teach strength ABC program.   Recommended Other Services Patient was give her prescription and knows how to obtain a compression sleeve.   Consulted and Agree with Plan of Care Patient        Problem List Patient Active Problem List   Diagnosis Date Noted  . Genetic testing 08/11/2015  . Recurrent cancer of right breast ([place]) 06/05/2015  . Breast cancer, right breast ([place]) 03/16/2012    Crystal [name] 09/04/2015, 8:58 PM  [place], [place], [address] Phone: [phone]   Fax:  [phone]  Name: Crystal [name] MRN: [id number] Date of Birth: 22-Apr-1956    Serafina Royals, PT 09/04/2015 8:58 PM

## 2015-09-05 ENCOUNTER — Ambulatory Visit: Payer: BLUE CROSS/BLUE SHIELD

## 2015-09-05 ENCOUNTER — Ambulatory Visit (HOSPITAL_BASED_OUTPATIENT_CLINIC_OR_DEPARTMENT_OTHER): Payer: BLUE CROSS/BLUE SHIELD | Admitting: Nurse Practitioner

## 2015-09-05 ENCOUNTER — Ambulatory Visit (HOSPITAL_BASED_OUTPATIENT_CLINIC_OR_DEPARTMENT_OTHER): Payer: BLUE CROSS/BLUE SHIELD

## 2015-09-05 ENCOUNTER — Encounter: Payer: Self-pay | Admitting: Nurse Practitioner

## 2015-09-05 ENCOUNTER — Telehealth: Payer: Self-pay | Admitting: Oncology

## 2015-09-05 ENCOUNTER — Other Ambulatory Visit: Payer: Self-pay | Admitting: Oncology

## 2015-09-05 ENCOUNTER — Other Ambulatory Visit: Payer: BLUE CROSS/BLUE SHIELD

## 2015-09-05 ENCOUNTER — Other Ambulatory Visit (HOSPITAL_BASED_OUTPATIENT_CLINIC_OR_DEPARTMENT_OTHER): Payer: BLUE CROSS/BLUE SHIELD

## 2015-09-05 VITALS — BP 118/60 | HR 73 | Temp 98.1°F | Resp 18 | Ht 66.0 in | Wt 138.3 lb

## 2015-09-05 DIAGNOSIS — C50411 Malignant neoplasm of upper-outer quadrant of right female breast: Secondary | ICD-10-CM

## 2015-09-05 DIAGNOSIS — Z5111 Encounter for antineoplastic chemotherapy: Secondary | ICD-10-CM | POA: Diagnosis not present

## 2015-09-05 DIAGNOSIS — C50911 Malignant neoplasm of unspecified site of right female breast: Secondary | ICD-10-CM

## 2015-09-05 DIAGNOSIS — Z170 Estrogen receptor positive status [ER+]: Secondary | ICD-10-CM

## 2015-09-05 DIAGNOSIS — R911 Solitary pulmonary nodule: Secondary | ICD-10-CM

## 2015-09-05 DIAGNOSIS — Z9011 Acquired absence of right breast and nipple: Secondary | ICD-10-CM

## 2015-09-05 LAB — CBC WITH DIFFERENTIAL/PLATELET
BASO%: 0.1 % (ref 0.0–2.0)
BASOS ABS: 0 10*3/uL (ref 0.0–0.1)
EOS%: 0 % (ref 0.0–7.0)
Eosinophils Absolute: 0 10*3/uL (ref 0.0–0.5)
HCT: 33.1 % — ABNORMAL LOW (ref 34.8–46.6)
HGB: 10.6 g/dL — ABNORMAL LOW (ref 11.6–15.9)
LYMPH%: 11.2 % — ABNORMAL LOW (ref 14.0–49.7)
MCH: 28.9 pg (ref 25.1–34.0)
MCHC: 32 g/dL (ref 31.5–36.0)
MCV: 90.2 fL (ref 79.5–101.0)
MONO#: 0.9 10*3/uL (ref 0.1–0.9)
MONO%: 6.9 % (ref 0.0–14.0)
NEUT#: 10.8 10*3/uL — ABNORMAL HIGH (ref 1.5–6.5)
NEUT%: 81.8 % — AB (ref 38.4–76.8)
Platelets: 342 10*3/uL (ref 145–400)
RBC: 3.67 10*6/uL — ABNORMAL LOW (ref 3.70–5.45)
RDW: 15.6 % — ABNORMAL HIGH (ref 11.2–14.5)
WBC: 13.2 10*3/uL — ABNORMAL HIGH (ref 3.9–10.3)
lymph#: 1.5 10*3/uL (ref 0.9–3.3)

## 2015-09-05 LAB — COMPREHENSIVE METABOLIC PANEL (CC13)
ALBUMIN: 3.9 g/dL (ref 3.5–5.0)
ALT: 35 U/L (ref 0–55)
AST: 17 U/L (ref 5–34)
Alkaline Phosphatase: 160 U/L — ABNORMAL HIGH (ref 40–150)
Anion Gap: 11 mEq/L (ref 3–11)
BUN: 20 mg/dL (ref 7.0–26.0)
CALCIUM: 9.8 mg/dL (ref 8.4–10.4)
CHLORIDE: 103 meq/L (ref 98–109)
CO2: 22 mEq/L (ref 22–29)
CREATININE: 0.8 mg/dL (ref 0.6–1.1)
EGFR: 80 mL/min/{1.73_m2} — ABNORMAL LOW (ref >90)
Glucose: 118 mg/dl (ref 70–140)
Potassium: 4.3 mEq/L (ref 3.5–5.1)
Sodium: 135 mEq/L — ABNORMAL LOW (ref 136–145)
Total Bilirubin: <0.3 mg/dL (ref 0.20–1.20)
Total Protein: 6.8 g/dL (ref 6.4–8.3)

## 2015-09-05 MED ORDER — DOCETAXEL CHEMO INJECTION 160 MG/16ML
75.0000 mg/m2 | Freq: Once | INTRAVENOUS | Status: AC
  Administered 2015-09-05: 130 mg via INTRAVENOUS
  Filled 2015-09-05: qty 13

## 2015-09-05 MED ORDER — HEPARIN SOD (PORK) LOCK FLUSH 100 UNIT/ML IV SOLN
500.0000 [IU] | Freq: Once | INTRAVENOUS | Status: AC | PRN
Start: 2015-09-05 — End: 2015-09-05
  Administered 2015-09-05: 500 [IU]
  Filled 2015-09-05: qty 5

## 2015-09-05 MED ORDER — SODIUM CHLORIDE 0.9 % IV SOLN
Freq: Once | INTRAVENOUS | Status: AC
  Administered 2015-09-05: 14:00:00 via INTRAVENOUS
  Filled 2015-09-05: qty 8

## 2015-09-05 MED ORDER — SODIUM CHLORIDE 0.9 % IJ SOLN
10.0000 mL | INTRAMUSCULAR | Status: DC | PRN
  Administered 2015-09-05: 10 mL
  Filled 2015-09-05: qty 10

## 2015-09-05 MED ORDER — SODIUM CHLORIDE 0.9 % IV SOLN
600.0000 mg/m2 | Freq: Once | INTRAVENOUS | Status: AC
  Administered 2015-09-05: 1020 mg via INTRAVENOUS
  Filled 2015-09-05: qty 51

## 2015-09-05 MED ORDER — PEGFILGRASTIM 6 MG/0.6ML ~~LOC~~ PSKT
6.0000 mg | PREFILLED_SYRINGE | Freq: Once | SUBCUTANEOUS | Status: AC
  Administered 2015-09-05: 6 mg via SUBCUTANEOUS
  Filled 2015-09-05: qty 0.6

## 2015-09-05 MED ORDER — SODIUM CHLORIDE 0.9 % IV SOLN
Freq: Once | INTRAVENOUS | Status: AC
  Administered 2015-09-05: 13:00:00 via INTRAVENOUS

## 2015-09-05 NOTE — Patient Instructions (Addendum)
[facility] Discharge Instructions for Patients Receiving Chemotherapy  Today you received the following chemotherapy agents: taxotere, cytoxan  To help prevent nausea and vomiting after your treatment, we encourage you to take your nausea medication.  Take it as often as prescribed.     If you develop nausea and vomiting that is not controlled by your nausea medication, call the clinic. If it is after clinic hours your family physician or the after hours number for the clinic or go to the Emergency Department.   BELOW ARE SYMPTOMS THAT SHOULD BE REPORTED IMMEDIATELY:  *FEVER GREATER THAN 100.5 F  *CHILLS WITH OR WITHOUT FEVER  NAUSEA AND VOMITING THAT IS NOT CONTROLLED WITH YOUR NAUSEA MEDICATION  *UNUSUAL SHORTNESS OF BREATH  *UNUSUAL BRUISING OR BLEEDING  TENDERNESS IN MOUTH AND THROAT WITH OR WITHOUT PRESENCE OF ULCERS  *URINARY PROBLEMS  *BOWEL PROBLEMS  UNUSUAL RASH Items with * indicate a potential emergency and should be followed up as soon as possible.  Feel free to call the clinic you have any questions or concerns. The clinic phone number is [phone].   I have been informed and understand all the instructions given to me. I know to contact the clinic, my physician, or go to the Emergency Department if any problems should occur. I do not have any questions at this time, but understand that I may call the clinic during office hours   should I have any questions or need assistance in obtaining follow up care.    __________________________________________  _____________  __________ Signature of Patient or Authorized Representative            Date                   Time    __________________________________________ Nurse's Signature    

## 2015-09-05 NOTE — Telephone Encounter (Signed)
Appointments made and avs printed for patient °

## 2015-09-05 NOTE — Progress Notes (Signed)
ID: Crystal [name]   DOB: 28-Jan-1956  MR#: [id number]  CSN#:[id number]  PCP: Crystal Hams, MD GYN: SU: Crystal Keens MD OTHER MD: Crystal [name], Crystal [name], Crystal [name]]  CHIEF COMPLAINT: recurrent estrogen receptor positive breast cancer  CURRENT TREATMENT: Awaiting definitive surgery  BREAST CANCER HISTORY:   From the original intake note:  The patient had bilateral diagnostic mammography 02/12/2011.  She has a history of cystic changes noted on prior studies in April 2011 and April 2010.  On the Feb 12, 2011 study there were new calcifications identified superiorly in the right breast, without any obvious mass.  The patient was brought back for further studies, 02/19/2011 and Dr. Marcelo [name] was able to localize the calcifications under stereotactic guidance.  The biopsy ([id number]) showed high-grade ductal carcinoma with papillary features which was 70% estrogen receptor positive, 0% progesterone receptor positive.  There was no definitive invasion and therefore further studies were not performed.   Bilateral breast MRIs were obtained Feb 26, 2011.  This showed marked nodular enhancement throughout both breasts, and in the upper central portion there was a post biopsy area of change measuring up to 3.7 cm.  Most of this is a fluid cavity with a thin rim of enhancement.  There were no suspicious areas in the left breast.  No internal mammary or axillary lymph nodes.  Of course, the patient has a history of hepatic adenomas and these were noted incidentally on the MRI. Her definitive surgical treatment and subsequent history are as detailed below.  INTERVAL HISTORY: Crystal [name] returns today for follow up of her now recurrent breast cancer. Today is day 1, cycle 3 of cyclophosphamide and docetaxel, with neulasta onpro given on day 2 for granulocyte support. The interval history is remarkable for developing an abscess to her vulva. She visited her GYN who started her on a 14 day course of  bactrim BID. She will return for follow up in 2 weeks at that office, but already feels improvement.   REVIEW OF SYSTEMS: Crystal [name] denies fevers, chills, nausea, vomiting, or changes in bowel or bladder habits. She used salt water rinses for a few days and avoided developing mouth sores. She continues on omeprazole for reflux. She denies rashes or neuropathy symptoms. Tramadol and ibuprofen work well to reduce the bone pain from neulasta. She had one major hot flash this past Sunday during church, but it resolved when she got some fresh air. Her energy level is decent. A detailed review of systems is otherwise stable.  PAST MEDICAL HISTORY: Past Medical History  Diagnosis Date  . History of small bowel obstruction   . Liver masses   . Insomnia   . GERD (gastroesophageal reflux disease)   . Complication of anesthesia     hard to wake up  . Cancer [facility])     right breast  history of small-bowel obstruction with partial colectomy March 2001.  The patient has a history of hepatic adenomas.  She has had liver resections x2.  The data for this is at [facility] and we will try to obtain it.  She has also had incisional hernia repair.  She has a history of osteopenia, and she has a history of multiple anti-red cell antibodies making a transfusion difficult.  The patient tells me that she has a history of low calcium and a tendency to get liver injections.  PAST SURGICAL HISTORY: Past Surgical History  Procedure Laterality Date  . Liver surgery    . Laparotomies    . Hernia  repair      incisional hernia repair x3  . Breast surgery Right   . Mastectomy w/ sentinel node biopsy Right 07/05/2015    Procedure: RIGHT MASTECTOMY WITH SENTINEL NODE BIOPSY AND PORT A CATH INSERTION;  Surgeon: Crystal Keens, MD;  Location: [place];  Service: General;  Laterality: Right;  . Portacath placement Left 07/05/2015    Procedure: INSERTION PORT-A-CATH;  Surgeon: Crystal Keens, MD;  Location: [place];  Service: General;  Laterality: Left;    FAMILY HISTORY Family History  Problem Relation Age of Onset  . Ovarian cancer Mother 72    "metastasized to breasts"  . Breast cancer Maternal Grandmother 48    2-3 recurrences  . Stomach cancer Maternal Grandfather     dx. 47-49; metastatic stomach cancer  . Colon polyps Father     about 2-3 polyps removed at each colonoscopy  . Colon polyps Brother     "a few"  . Other Maternal Aunt     +TAH  . Colon cancer Paternal Aunt 90  . Brain cancer Daughter 17    astrocytoma  . Breast cancer Cousin     dx. 11s  The patient's mother died from ovarian cancer at the age of 38.  The patient's father is alive at age 10.  She has 2 brothers, no sisters.  A maternal grandmother was diagnosed with breast cancer at the age of 51, and the maternal grandfather had stomach cancer at the age of 71.  GYNECOLOGIC HISTORY: The patient had menarche at age 38.  Her [name] menstrual period was in 2005.  She never used hormone replacement.  She used birth control briefly to control bleeding prior to 1980.  She was 59 years old at the birth of her first child.  She started having mammograms at age 66 because of a strong family history.  SOCIAL HISTORY: Crystal [name] works as a Programmer, applications for [employer].   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  Son, Crystal [name], lives in [place] and works for that [employer]  Daughter, Crystal [name], lives in [place] and is a homemaker.   ADVANCED DIRECTIVES: in place  HEALTH MAINTENANCE: Social History  Substance Use Topics  . Smoking status: Never Smoker   . Smokeless tobacco: Never Used  . Alcohol Use: 0.6 oz/week    1 Glasses of wine per week     Comment: social     Colonoscopy:  PAP:  Bone density:  Lipid panel:  Allergies  Allergen Reactions  . Promethazine Hcl Other (See  Comments)    Causes shaking  . Tylenol [Acetaminophen]     Avoids Tylenol products due to liver disease   . Penicillins Rash    Current Outpatient Prescriptions  Medication Sig Dispense Refill  . b complex vitamins tablet Take 1 tablet by mouth daily.      . calcium-vitamin D (OSCAL WITH D) 500-200 MG-UNIT per tablet Take 2 tablets by mouth daily.    . Carboxymethylcellul-Glycerin (CLEAR EYES FOR DRY EYES OP) Apply 1 drop to eye as needed.    Marland Kitchen dexamethasone (DECADRON) 4 MG tablet Take 2 tablets (8 mg total) by mouth 2 (two) times daily. Start the day before Taxotere. Then again the day after chemo for 3 days. 30 tablet 1  . famotidine (PEPCID AC) 10 MG chewable tablet Chew 10 mg by mouth daily  as needed for heartburn.    . fish oil-omega-3 fatty acids 1000 MG capsule Take 2 g by mouth daily.      Marland Kitchen glucosamine-chondroitin 500-400 MG tablet Take 1 tablet by mouth 3 (three) times daily.      Marland Kitchen ibuprofen (ADVIL,MOTRIN) 200 MG tablet Take 200 mg by mouth every 6 (six) hours as needed.    . lidocaine-prilocaine (EMLA) cream Apply to affected area once 30 g 3  . LORazepam (ATIVAN) 0.5 MG tablet Take 1 tablet (0.5 mg total) by mouth at bedtime. 30 tablet 0  . Multiple Vitamins-Minerals (MULTIVITAMIN WITH MINERALS) tablet Take 1 tablet by mouth daily.      Marland Kitchen sulfamethoxazole-trimethoprim (BACTRIM DS,SEPTRA DS) 800-160 MG tablet Take 1 tablet by mouth 2 (two) times daily.  0  . traMADol (ULTRAM) 50 MG tablet Take 1 tablet (50 mg total) by mouth every 6 (six) hours as needed. 60 tablet 2  . TURMERIC PO Take 400 mg by mouth daily. Pt takes 2 tablets 400 mg each= 800 mg total    . ondansetron (ZOFRAN) 8 MG tablet Take 1 tablet (8 mg total) by mouth 2 (two) times daily. Start the day after chemo for 3 days. Then take as needed for nausea or vomiting. (Patient not taking: Reported on 09/05/2015) 30 tablet 1  . prochlorperazine (COMPAZINE) 10 MG tablet Take 1 tablet (10 mg total) by mouth every 6 (six)  hours as needed (Nausea or vomiting). (Patient not taking: Reported on 09/05/2015) 30 tablet 1  . tobramycin-dexamethasone (TOBRADEX) ophthalmic solution Place 1 drop into both eyes 2 (two) times daily. (Patient not taking: Reported on 09/05/2015) 5 mL 1   No current facility-administered medications for this visit.    OBJECTIVE: Middle-aged white woman who appears younger than stated age  35 Vitals:   09/05/15 1144  BP: 118/60  Pulse: 73  Temp: 98.1 F (36.7 C)  Resp: 18     Body mass index is 22.33 kg/(m^2).    ECOG FS: 0  Skin: warm, dry  HEENT: sclerae anicteric, conjunctivae pink, oropharynx clear. No thrush or mucositis.  Lymph Nodes: No cervical or supraclavicular lymphadenopathy  Lungs: clear to auscultation bilaterally, no rales, wheezes, or rhonci  Heart: regular rate and rhythm  Abdomen: round, soft, non tender, positive bowel sounds  Musculoskeletal: No focal spinal tenderness, no peripheral edema  Neuro: non focal, well oriented, positive affect  Breasts: deferred  LAB RESULTS: Lab Results  Component Value Date   WBC 13.2* 09/05/2015   NEUTROABS 10.8* 09/05/2015   HGB 10.6* 09/05/2015   HCT 33.1* 09/05/2015   MCV 90.2 09/05/2015   PLT 342 09/05/2015      Chemistry      Component Value Date/Time   NA 135* 09/05/2015 1121   NA 139 03/10/2013 0848   K 4.3 09/05/2015 1121   K 3.9 03/10/2013 0848   CL 102 03/15/2013 0912   CL 100 03/10/2013 0848   CO2 22 09/05/2015 1121   CO2 23 03/10/2013 0848   BUN 20.0 09/05/2015 1121   BUN 20 03/10/2013 0848   CREATININE 0.8 09/05/2015 1121   CREATININE 0.69 03/10/2013 0848      Component Value Date/Time   CALCIUM 9.8 09/05/2015 1121   CALCIUM 10.0 03/10/2013 0848   ALKPHOS 160* 09/05/2015 1121   ALKPHOS 131* 03/10/2013 0848   AST 17 09/05/2015 1121   AST 29 03/10/2013 0848   ALT 35 09/05/2015 1121   ALT 36* 03/10/2013 0848   BILITOT <0.30 09/05/2015  1121   BILITOT 0.3 03/10/2013 0848       Lab  Results  Component Value Date   LABCA2 28 10/20/2012    No components found for: CBJSE831  No results for input(s): INR in the [name] 168 hours.  Urinalysis No results found for: COLORURINE  STUDIES: No results found.  ASSESSMENT: 59 y.o.  BRCA 1-2 and p-53 negative Crystal [name] woman status post right lumpectomy and sentinel lymph node sampling June 2012 for a T1a N0, Stage IA  invasive ductal carcinoma, grade 3, estrogen receptor 98% positive, progesterone receptor 97% positive, with an MIB-1 of 5% and no HER-2 amplification   (a) note that the initial Right breast biopsy showed DCIS w papillary architecture, estrogen receptor 70% positive, progesterone receptor negative  (1) completed radiation September 2012.   (2) on letrozole since July 2012   RECURRENT DISEASE: (3) right breast upper outer quadrant biopsy 05/23/2015 was positive for a clinical T1c N0, stage IA invasive ductal carcinoma, grade 2, estrogen receptor 60% positive with moderate staining intensity, progesterone receptor negative, with no HER-2 ossification and an MIB-1 of 90%.  (4) status post right simple mastectomy 07/05/2015 for a pT1c pN0, stage IA invasive ductal carcinoma, grade 3, with negative margins. 4 lymph nodes removed with mastectomy were benign.  (5) to begin cyclophosphamide and docetaxel q21 days x 4, with neulasta onpro given on day 2 for granulocyte support  (6) genetics testing 07/20/2015, results pending  (7) RUL spiculated nodule measuring 1.2 cm noted on staging CT scan 06/13/2015-- stable on repeat CT 07/25/15  PLAN:  Crystal [name] continues to manage treatment well. The labs were reviewed in detail and were stable. She will proceed with cycle 3 of cyclophosphamide and docetaxel as planned today.  She will return in 1 week for labs and a nadir visit. She understands and agrees with this plan. She knows the goal of treatment in her case is cure. She has been encouraged to call with any issues that  might arise before her next visit here.   Genelle Gather [place]    09/05/2015

## 2015-09-11 ENCOUNTER — Other Ambulatory Visit: Payer: Self-pay

## 2015-09-11 DIAGNOSIS — C50911 Malignant neoplasm of unspecified site of right female breast: Secondary | ICD-10-CM

## 2015-09-12 ENCOUNTER — Other Ambulatory Visit (HOSPITAL_BASED_OUTPATIENT_CLINIC_OR_DEPARTMENT_OTHER): Payer: BLUE CROSS/BLUE SHIELD

## 2015-09-12 ENCOUNTER — Ambulatory Visit (HOSPITAL_BASED_OUTPATIENT_CLINIC_OR_DEPARTMENT_OTHER): Payer: BLUE CROSS/BLUE SHIELD | Admitting: Nurse Practitioner

## 2015-09-12 ENCOUNTER — Encounter: Payer: Self-pay | Admitting: Nurse Practitioner

## 2015-09-12 VITALS — BP 128/63 | HR 84 | Temp 98.3°F | Resp 18 | Ht 66.0 in | Wt 139.3 lb

## 2015-09-12 DIAGNOSIS — C50411 Malignant neoplasm of upper-outer quadrant of right female breast: Secondary | ICD-10-CM

## 2015-09-12 DIAGNOSIS — K219 Gastro-esophageal reflux disease without esophagitis: Secondary | ICD-10-CM

## 2015-09-12 DIAGNOSIS — C50911 Malignant neoplasm of unspecified site of right female breast: Secondary | ICD-10-CM

## 2015-09-12 DIAGNOSIS — R911 Solitary pulmonary nodule: Secondary | ICD-10-CM | POA: Diagnosis not present

## 2015-09-12 LAB — COMPREHENSIVE METABOLIC PANEL (CC13)
ALBUMIN: 3.6 g/dL (ref 3.5–5.0)
ALK PHOS: 155 U/L — AB (ref 40–150)
ALT: 33 U/L (ref 0–55)
AST: 28 U/L (ref 5–34)
Anion Gap: 9 mEq/L (ref 3–11)
BUN: 15.4 mg/dL (ref 7.0–26.0)
CALCIUM: 9.3 mg/dL (ref 8.4–10.4)
CO2: 25 mEq/L (ref 22–29)
Chloride: 101 mEq/L (ref 98–109)
Creatinine: 0.8 mg/dL (ref 0.6–1.1)
EGFR: 75 mL/min/{1.73_m2} — AB (ref >90)
Glucose: 103 mg/dl (ref 70–140)
POTASSIUM: 4.3 meq/L (ref 3.5–5.1)
Sodium: 135 mEq/L — ABNORMAL LOW (ref 136–145)
Total Protein: 6.4 g/dL (ref 6.4–8.3)

## 2015-09-12 LAB — CBC WITH DIFFERENTIAL/PLATELET
BASO%: 0.9 % (ref 0.0–2.0)
BASOS ABS: 0.2 10*3/uL — AB (ref 0.0–0.1)
EOS ABS: 0.2 10*3/uL (ref 0.0–0.5)
EOS%: 0.8 % (ref 0.0–7.0)
HEMATOCRIT: 32.6 % — AB (ref 34.8–46.6)
HEMOGLOBIN: 10.4 g/dL — AB (ref 11.6–15.9)
LYMPH#: 1.3 10*3/uL (ref 0.9–3.3)
LYMPH%: 7 % — ABNORMAL LOW (ref 14.0–49.7)
MCH: 28.5 pg (ref 25.1–34.0)
MCHC: 31.9 g/dL (ref 31.5–36.0)
MCV: 89.3 fL (ref 79.5–101.0)
MONO#: 1.4 10*3/uL — ABNORMAL HIGH (ref 0.1–0.9)
MONO%: 7.6 % (ref 0.0–14.0)
NEUT#: 16 10*3/uL — ABNORMAL HIGH (ref 1.5–6.5)
NEUT%: 83.7 % — ABNORMAL HIGH (ref 38.4–76.8)
Platelets: 259 10*3/uL (ref 145–400)
RBC: 3.65 10*6/uL — ABNORMAL LOW (ref 3.70–5.45)
RDW: 16.3 % — AB (ref 11.2–14.5)
WBC: 19.1 10*3/uL — ABNORMAL HIGH (ref 3.9–10.3)

## 2015-09-12 NOTE — Progress Notes (Signed)
ID: Crystal Crystal [name]   DOB: [birth year]  MR#: [id number]  CSN#:[id number]  PCP: Crystal Hams, MD GYN: SU: Crystal Keens MD OTHER MD: Crystal Crystal [name], Crystal Crystal [name], Crystal Crystal [name]]  CHIEF COMPLAINT: recurrent estrogen receptor positive breast cancer  CURRENT TREATMENT: Awaiting definitive surgery  BREAST CANCER HISTORY:   From the original intake note:  The patient had bilateral diagnostic mammography 02/12/2011.  She has a history of cystic changes noted on prior studies in April 2011 and April 2010.  On the Feb 12, 2011 study there were new calcifications identified superiorly in the right breast, without any obvious mass.  The patient was brought back for further studies, 02/19/2011 and Dr. Marcelo [name] was able to localize the calcifications under stereotactic guidance.  The biopsy ([id number]) showed high-grade ductal carcinoma with papillary features which was 70% estrogen receptor positive, 0% progesterone receptor positive.  There was no definitive invasion and therefore further studies were not performed.   Bilateral breast MRIs were obtained Feb 26, 2011.  This showed marked nodular enhancement throughout both breasts, and in the upper central portion there was a post biopsy area of change measuring up to 3.7 cm.  Most of this is a fluid cavity with a thin rim of enhancement.  There were no suspicious areas in the left breast.  No internal mammary or axillary lymph nodes.  Of course, the patient has a history of hepatic adenomas and these were noted incidentally on the MRI. Her definitive surgical treatment and subsequent history are as detailed below.  INTERVAL HISTORY: Crystal Crystal [name] returns today for follow up of her now recurrent breast cancer. Today is day 8, cycle 3 of cyclophosphamide and docetaxel, with neulasta onpro given on day 2 for granulocyte support.   REVIEW OF SYSTEMS: Crystal [name] went for follow up of the abscess to her vulva, which has completely resolved now. She has 1 pill left  in her antibiotic course. She has developed chest congestion and a cough in the past week, but this is getting better daily. She denies fevers, chills, nausea, vomiting, or changes in bowel or bladder habits. She used salt water rinses for a few days and avoided developing mouth sores. She continues on omeprazole for reflux. She denies rashes or neuropathy symptoms. Tramadol and ibuprofen work well to reduce the bone pain from neulasta. Her energy level is decent. A detailed review of systems is otherwise stable.  PAST MEDICAL HISTORY: Past Medical History  Diagnosis Date  . History of small bowel obstruction   . Liver masses   . Insomnia   . GERD (gastroesophageal reflux disease)   . Complication of anesthesia     hard to wake up  . Cancer [facility])     right breast  history of small-bowel obstruction with partial colectomy March 2001.  The patient has a history of hepatic adenomas.  She has had liver resections x2.  The data for this is at [facility] and we will try to obtain it.  She has also had incisional hernia repair.  She has a history of osteopenia, and she has a history of multiple anti-red cell antibodies making a transfusion difficult.  The patient tells me that she has a history of low calcium and a tendency to get liver injections.  PAST SURGICAL HISTORY: Past Surgical History  Procedure Laterality Date  . Liver surgery    . Laparotomies    . Hernia repair      incisional hernia repair x3  . Breast surgery Right   . Mastectomy w/  sentinel node biopsy Right 07/05/2015    Procedure: RIGHT MASTECTOMY WITH SENTINEL NODE BIOPSY AND PORT A CATH INSERTION;  Surgeon: Crystal Keens, MD;  Location: [place];  Service: General;  Laterality: Right;  . Portacath placement Left 07/05/2015    Procedure: INSERTION PORT-A-CATH;  Surgeon: Crystal Keens, MD;  Location: [place];  Service: General;  Laterality: Left;    FAMILY HISTORY Family History  Problem  Relation Age of Onset  . Ovarian cancer Mother 55    "metastasized to breasts"  . Breast cancer Maternal Grandmother 48    2-3 recurrences  . Stomach cancer Maternal Grandfather     dx. 47-49; metastatic stomach cancer  . Colon polyps Father     about 2-3 polyps removed at each colonoscopy  . Colon polyps Brother     "a few"  . Other Maternal Aunt     +TAH  . Colon cancer Paternal Aunt 90  . Brain cancer Daughter 17    astrocytoma  . Breast cancer Cousin     dx. 66s  The patient's mother died from ovarian cancer at the age of 31.  The patient's father is alive at age 58.  She has 2 brothers, no sisters.  A maternal grandmother was diagnosed with breast cancer at the age of 82, and the maternal grandfather had stomach cancer at the age of 30.  GYNECOLOGIC HISTORY: The patient had menarche at age 55.  Her Crystal [name] menstrual period was in 2005.  She never used hormone replacement.  She used birth control briefly to control bleeding prior to 1980.  She was 59 years old at the birth of her first child.  She started having mammograms at age 23 because of a strong family history.  SOCIAL HISTORY: Crystal [name] works as a Programmer, applications for [employer].   She also heads the local "[employer]" Her husband, Crystal Crystal [name], is a retired Engineer, maintenance from a [employer] locally.  Son, Crystal Crystal [name], lives in [place] and works for that [employer]  Daughter, Crystal Crystal [name], lives in [place] and is a homemaker.   ADVANCED DIRECTIVES: in place  HEALTH MAINTENANCE: Social History  Substance Use Topics  . Smoking status: Never Smoker   . Smokeless tobacco: Never Used  . Alcohol Use: 0.6 oz/week    1 Glasses of wine per week     Comment: social     Colonoscopy:  PAP:  Bone density:  Lipid panel:  Allergies  Allergen Reactions  . Promethazine Hcl Other (See Comments)    Causes shaking  . Tylenol [Acetaminophen]     Avoids Tylenol products due to liver disease    . Penicillins Rash    Current Outpatient Prescriptions  Medication Sig Dispense Refill  . b complex vitamins tablet Take 1 tablet by mouth daily.      . calcium-vitamin D (OSCAL WITH D) 500-200 MG-UNIT per tablet Take 2 tablets by mouth daily.    . Carboxymethylcellul-Glycerin (CLEAR EYES FOR DRY EYES OP) Apply 1 drop to eye as needed.    . famotidine (PEPCID AC) 10 MG chewable tablet Chew 10 mg by mouth daily as needed for heartburn.    . fish oil-omega-3 fatty acids 1000 MG capsule Take 2 g by mouth daily.      Marland Kitchen glucosamine-chondroitin 500-400 MG tablet Take 1 tablet by mouth 3 (three) times daily.      Marland Kitchen lidocaine-prilocaine (EMLA) cream Apply to affected area once 30 g  3  . LORazepam (ATIVAN) 0.5 MG tablet Take 1 tablet (0.5 mg total) by mouth at bedtime. 30 tablet 0  . Multiple Vitamins-Minerals (MULTIVITAMIN WITH MINERALS) tablet Take 1 tablet by mouth daily.      Marland Kitchen sulfamethoxazole-trimethoprim (BACTRIM DS,SEPTRA DS) 800-160 MG tablet Take 1 tablet by mouth 2 (two) times daily.  0  . traMADol (ULTRAM) 50 MG tablet Take 1 tablet (50 mg total) by mouth every 6 (six) hours as needed. 60 tablet 2  . TURMERIC PO Take 400 mg by mouth daily. Pt takes 2 tablets 400 mg each= 800 mg total    . dexamethasone (DECADRON) 4 MG tablet Take 2 tablets (8 mg total) by mouth 2 (two) times daily. Start the day before Taxotere. Then again the day after chemo for 3 days. (Patient not taking: Reported on 09/12/2015) 30 tablet 1  . ibuprofen (ADVIL,MOTRIN) 200 MG tablet Take 200 mg by mouth every 6 (six) hours as needed.    . ondansetron (ZOFRAN) 8 MG tablet Take 1 tablet (8 mg total) by mouth 2 (two) times daily. Start the day after chemo for 3 days. Then take as needed for nausea or vomiting. (Patient not taking: Reported on 09/05/2015) 30 tablet 1  . prochlorperazine (COMPAZINE) 10 MG tablet Take 1 tablet (10 mg total) by mouth every 6 (six) hours as needed (Nausea or vomiting). (Patient not taking:  Reported on 09/05/2015) 30 tablet 1  . tobramycin-dexamethasone (TOBRADEX) ophthalmic solution Place 1 drop into both eyes 2 (two) times daily. (Patient not taking: Reported on 09/05/2015) 5 mL 1   No current facility-administered medications for this visit.    OBJECTIVE: Middle-aged white woman who appears younger than stated age  59 Vitals:   09/12/15 1243  BP: 128/63  Pulse: 84  Temp: 98.3 F (36.8 C)  Resp: 18     Body mass index is 22.49 kg/(m^2).    ECOG FS: 0  Sclerae unicteric, pupils round and equal Oropharynx clear and moist-- no thrush or other lesions No cervical or supraclavicular adenopathy Lungs no rales or rhonchi Heart regular rate and rhythm Abd soft, nontender, positive bowel sounds MSK no focal spinal tenderness, no upper extremity lymphedema Neuro: nonfocal, well oriented, appropriate affect Breasts: deferred  LAB RESULTS: Lab Results  Component Value Date   WBC 19.1* 09/12/2015   NEUTROABS 16.0* 09/12/2015   HGB 10.4* 09/12/2015   HCT 32.6* 09/12/2015   MCV 89.3 09/12/2015   PLT 259 09/12/2015      Chemistry      Component Value Date/Time   NA 135* 09/12/2015 1235   NA 139 03/10/2013 0848   K 4.3 09/12/2015 1235   K 3.9 03/10/2013 0848   CL 102 03/15/2013 0912   CL 100 03/10/2013 0848   CO2 25 09/12/2015 1235   CO2 23 03/10/2013 0848   BUN 15.4 09/12/2015 1235   BUN 20 03/10/2013 0848   CREATININE 0.8 09/12/2015 1235   CREATININE 0.69 03/10/2013 0848      Component Value Date/Time   CALCIUM 9.3 09/12/2015 1235   CALCIUM 10.0 03/10/2013 0848   ALKPHOS 155* 09/12/2015 1235   ALKPHOS 131* 03/10/2013 0848   AST 28 09/12/2015 1235   AST 29 03/10/2013 0848   ALT 33 09/12/2015 1235   ALT 36* 03/10/2013 0848   BILITOT <0.30 09/12/2015 1235   BILITOT 0.3 03/10/2013 0848       Lab Results  Component Value Date   LABCA2 28 10/20/2012    No components found  for: [id number]  No results for input(s): INR in the Crystal [name] 168  hours.  Urinalysis No results found for: COLORURINE  STUDIES: No results found.  ASSESSMENT: 59 y.o.  BRCA 1-2 and p-53 negative Pace woman status post right lumpectomy and sentinel lymph node sampling June 2012 for a T1a N0, Stage IA  invasive ductal carcinoma, grade 3, estrogen receptor 98% positive, progesterone receptor 97% positive, with an MIB-1 of 5% and no HER-2 amplification   (a) note that the initial Right breast biopsy showed DCIS w papillary architecture, estrogen receptor 70% positive, progesterone receptor negative  (1) completed radiation September 2012.   (2) on letrozole since July 2012   RECURRENT DISEASE: (3) right breast upper outer quadrant biopsy 05/23/2015 was positive for a clinical T1c N0, stage IA invasive ductal carcinoma, grade 2, estrogen receptor 60% positive with moderate staining intensity, progesterone receptor negative, with no HER-2 ossification and an MIB-1 of 90%.  (4) status post right simple mastectomy 07/05/2015 for a pT1c pN0, stage IA invasive ductal carcinoma, grade 3, with negative margins. 4 lymph nodes removed with mastectomy were benign.  (5) to begin cyclophosphamide and docetaxel q21 days x 4, with neulasta onpro given on day 2 for granulocyte support  (6) genetics testing 07/20/2015, results pending  (7) RUL spiculated nodule measuring 1.2 cm noted on staging CT scan 06/13/2015-- stable on repeat CT 07/25/15  PLAN:  Crystal Crystal [name] is entirely stable today. She is managing the cough and congestion well on her own. The labs were reviewed in detail and showed very little changes from Crystal [name] week.   She will return in 2 weeks for her 4th and final cycle of cyclophosphamide and docetaxel. She understands and agrees with this plan. She knows the goal of treatment in her case is cure. She has been encouraged to call with any issues that might arise before her next visit here.   Genelle Gather Safira Proffit    09/12/2015

## 2015-09-14 ENCOUNTER — Ambulatory Visit: Payer: BLUE CROSS/BLUE SHIELD

## 2015-09-16 ENCOUNTER — Encounter (HOSPITAL_COMMUNITY): Payer: Self-pay

## 2015-09-21 ENCOUNTER — Ambulatory Visit: Payer: BLUE CROSS/BLUE SHIELD | Attending: Oncology

## 2015-09-21 DIAGNOSIS — Z9189 Other specified personal risk factors, not elsewhere classified: Secondary | ICD-10-CM | POA: Diagnosis present

## 2015-09-21 DIAGNOSIS — Z9011 Acquired absence of right breast and nipple: Secondary | ICD-10-CM | POA: Diagnosis present

## 2015-09-21 DIAGNOSIS — M25611 Stiffness of right shoulder, not elsewhere classified: Secondary | ICD-10-CM | POA: Diagnosis not present

## 2015-09-21 NOTE — Therapy (Addendum)
[place], [place], [address] Phone: [phone]   Fax:  [phone]  Physical Therapy Treatment  Patient Details  Name: Crystal [name] MRN: [id number] Date of Birth: [birth year] Referring Provider: Dr. Lurline Del  Encounter Date: 09/21/2015      PT End of Session - 09/21/15 1158    Visit Number 7   Number of Visits 8   Date for PT Re-Evaluation 09/18/15   PT Start Time 1107   PT Stop Time 1151   PT Time Calculation (min) 44 min   Activity Tolerance Patient tolerated treatment well   Behavior During Therapy [facility] for tasks assessed/performed      Past Medical History  Diagnosis Date  . History of small bowel obstruction   . Liver masses   . Insomnia   . GERD (gastroesophageal reflux disease)   . Complication of anesthesia     hard to wake up  . Cancer [facility])     right breast    Past Surgical History  Procedure Laterality Date  . Liver surgery    . Laparotomies    . Hernia repair      incisional hernia repair x3  . Breast surgery Right   . Mastectomy w/ sentinel node biopsy Right 07/05/2015    Procedure: RIGHT MASTECTOMY WITH SENTINEL NODE BIOPSY AND PORT A CATH INSERTION;  Surgeon: [name], MD;  Location: [place];  Service: General;  Laterality: Right;  . Portacath placement Left 07/05/2015    Procedure: INSERTION PORT-A-CATH;  Surgeon: [name], MD;  Location: [place];  Service: General;  Laterality: Left;    There were no vitals filed for this visit.  Visit Diagnosis:  Shoulder stiffness, right  At risk for lymphedema  S/P mastectomy, right      Subjective Assessment - 09/21/15 1116    Subjective Today is my last day, I'm ready to learn the Strength ABC Program that Butch Penny told me about last time.I'm slowly feeling better from my last treatment I ended up with a bad cold.     Currently in Pain? No/denies                          [facility] Adult PT Treatment/Exercise - 09/21/15 0001    Shoulder Exercises: Pulleys   Flexion 2 minutes   ABduction 2 minutes   Shoulder Exercises: ROM/Strengthening   Other ROM/Strengthening Exercises All Stretches from Strength ABC Program including: Bil chest, shoulder, tricep, calf, and quadricep stretch in standign and then seated hamstrin and figure 4 piriformis stretch and supine low trunk rotation all 1 rep each for 15 second holds.    Other ROM/Strengthening Exercises All Strength from Strength ABC Program including: Supine bridging and chest press with 2 lbs, bil S/L clam, quadruped alternating UE/LE raises (5 reps each side),  then standing for squats with 2 lbs, standing "W" against wall, one armed row with chair and 2 lbs, bil hip abduction, scaption against wall with 2 lbs, 6" step ups, tricep kickbacks with 2 lbs, calf raises and bicep curls against wall with 2 lbs all 5 reps due                 PT Education - 09/21/15 1157    Education provided Yes   Education Details Strength ABC Program   Person(s) Educated Patient   Methods Explanation;Demonstration;Handout;Verbal cues   Comprehension Verbalized understanding;Returned demonstration  [facility] Goals - 09/21/15 1159    CC Long Term Goal  #1   Title Patient will be independent with a home exercise program for shoulder ROM and strengthening   Status Achieved   CC Long Term Goal  #2   Title Patient will be able to verbalize understanding of lymphedema risk reduction practices   Status Achieved   CC Long Term Goal  #3   Title Increase right shoulder active flexion to >/= 160 degrees for increased ease reaching overhead   Status Achieved   CC Long Term Goal  #4   Title Increase right shoulder abduction to >/= 160 degrees for increased ease reaching  AROM WNLs   Status Achieved   CC Long Term Goal  #5   Title Report she is able to reach overhead  without complaints of increased pain in right axilla or shoulder.   Status Achieved            Plan - 09/21/15 1158    Clinical Impression Statement Pt did excellent with return demo of all Strength ABC Program exercises today. She reported no pain after and is pleased with her pgroess overall andfeels prepared for discharge.    Pt will benefit from skilled therapeutic intervention in order to improve on the following deficits Decreased strength;Decreased knowledge of precautions;Pain;Impaired UE functional use;Decreased range of motion   Rehab Potential Excellent   Clinical Impairments Affecting Rehab Potential none   PT Frequency 2x / week   PT Duration 6 weeks   PT Treatment/Interventions Therapeutic exercise;Manual techniques;Passive range of motion   PT Next Visit Plan Discharge this visit.   PT Home Exercise Plan Strength ABC Program   Consulted and Agree with Plan of Care Patient        Problem List Patient Active Problem List   Diagnosis Date Noted  . Genetic testing 08/11/2015  . Recurrent cancer of right breast ([place]) 06/05/2015  . Breast cancer, right breast ([place]) 03/16/2012    Otelia Limes, PTA 09/21/2015, 12:02 PM  [place], [place], [address] Phone: [phone]   Fax:  [phone]  Name: Crystal [name] MRN: [id number] Date of Birth: 06-09-1956    PHYSICAL THERAPY DISCHARGE SUMMARY  Visits from Start of Care: 7  Current functional level related to goals / functional outcomes: See above   Remaining deficits: None   Education / Equipment: HEP; ABC Class  Plan: Patient agrees to discharge.  Patient goals were partially met. Patient is being discharged due to meeting the stated rehab goals.  ?????   Annia Friendly, [place] 09/21/2015 12:30 PM

## 2015-09-25 ENCOUNTER — Other Ambulatory Visit: Payer: Self-pay | Admitting: *Deleted

## 2015-09-25 DIAGNOSIS — C50911 Malignant neoplasm of unspecified site of right female breast: Secondary | ICD-10-CM

## 2015-09-26 ENCOUNTER — Encounter: Payer: Self-pay | Admitting: Nurse Practitioner

## 2015-09-26 ENCOUNTER — Other Ambulatory Visit (HOSPITAL_BASED_OUTPATIENT_CLINIC_OR_DEPARTMENT_OTHER): Payer: BLUE CROSS/BLUE SHIELD

## 2015-09-26 ENCOUNTER — Ambulatory Visit (HOSPITAL_BASED_OUTPATIENT_CLINIC_OR_DEPARTMENT_OTHER): Payer: BLUE CROSS/BLUE SHIELD

## 2015-09-26 ENCOUNTER — Encounter: Payer: Self-pay | Admitting: *Deleted

## 2015-09-26 ENCOUNTER — Other Ambulatory Visit: Payer: BLUE CROSS/BLUE SHIELD

## 2015-09-26 ENCOUNTER — Ambulatory Visit: Payer: BLUE CROSS/BLUE SHIELD

## 2015-09-26 ENCOUNTER — Ambulatory Visit (HOSPITAL_BASED_OUTPATIENT_CLINIC_OR_DEPARTMENT_OTHER): Payer: BLUE CROSS/BLUE SHIELD | Admitting: Nurse Practitioner

## 2015-09-26 VITALS — BP 138/41 | HR 85 | Temp 98.4°F | Resp 18 | Ht 66.0 in | Wt 140.3 lb

## 2015-09-26 DIAGNOSIS — C50411 Malignant neoplasm of upper-outer quadrant of right female breast: Secondary | ICD-10-CM

## 2015-09-26 DIAGNOSIS — R911 Solitary pulmonary nodule: Secondary | ICD-10-CM | POA: Diagnosis not present

## 2015-09-26 DIAGNOSIS — Z5189 Encounter for other specified aftercare: Secondary | ICD-10-CM | POA: Diagnosis not present

## 2015-09-26 DIAGNOSIS — C50911 Malignant neoplasm of unspecified site of right female breast: Secondary | ICD-10-CM

## 2015-09-26 DIAGNOSIS — Z5111 Encounter for antineoplastic chemotherapy: Secondary | ICD-10-CM

## 2015-09-26 LAB — COMPREHENSIVE METABOLIC PANEL
ALBUMIN: 3.8 g/dL (ref 3.5–5.0)
ALK PHOS: 140 U/L (ref 40–150)
ALT: 29 U/L (ref 0–55)
AST: 12 U/L (ref 5–34)
Anion Gap: 12 mEq/L — ABNORMAL HIGH (ref 3–11)
BUN: 17.9 mg/dL (ref 7.0–26.0)
CALCIUM: 9.3 mg/dL (ref 8.4–10.4)
CO2: 22 mEq/L (ref 22–29)
CREATININE: 0.9 mg/dL (ref 0.6–1.1)
Chloride: 106 mEq/L (ref 98–109)
EGFR: 67 mL/min/{1.73_m2} — ABNORMAL LOW (ref >90)
Glucose: 254 mg/dl — ABNORMAL HIGH (ref 70–140)
Potassium: 3.9 mEq/L (ref 3.5–5.1)
Sodium: 139 mEq/L (ref 136–145)
TOTAL PROTEIN: 7.1 g/dL (ref 6.4–8.3)
Total Bilirubin: 0.3 mg/dL (ref 0.20–1.20)

## 2015-09-26 LAB — CBC WITH DIFFERENTIAL/PLATELET
BASO%: 0.1 % (ref 0.0–2.0)
BASOS ABS: 0 10*3/uL (ref 0.0–0.1)
EOS%: 0 % (ref 0.0–7.0)
Eosinophils Absolute: 0 10*3/uL (ref 0.0–0.5)
HEMATOCRIT: 32.8 % — AB (ref 34.8–46.6)
HEMOGLOBIN: 10.5 g/dL — AB (ref 11.6–15.9)
LYMPH#: 0.8 10*3/uL — AB (ref 0.9–3.3)
LYMPH%: 8 % — ABNORMAL LOW (ref 14.0–49.7)
MCH: 29.3 pg (ref 25.1–34.0)
MCHC: 32.1 g/dL (ref 31.5–36.0)
MCV: 91.2 fL (ref 79.5–101.0)
MONO#: 0.2 10*3/uL (ref 0.1–0.9)
MONO%: 2.3 % (ref 0.0–14.0)
NEUT%: 89.6 % — ABNORMAL HIGH (ref 38.4–76.8)
NEUTROS ABS: 9.2 10*3/uL — AB (ref 1.5–6.5)
Platelets: 292 10*3/uL (ref 145–400)
RBC: 3.6 10*6/uL — ABNORMAL LOW (ref 3.70–5.45)
RDW: 17.6 % — AB (ref 11.2–14.5)
WBC: 10.3 10*3/uL (ref 3.9–10.3)

## 2015-09-26 MED ORDER — SODIUM CHLORIDE 0.9 % IV SOLN
Freq: Once | INTRAVENOUS | Status: AC
  Administered 2015-09-26: 12:00:00 via INTRAVENOUS

## 2015-09-26 MED ORDER — SODIUM CHLORIDE 0.9 % IV SOLN
75.0000 mg/m2 | Freq: Once | INTRAVENOUS | Status: AC
  Administered 2015-09-26: 130 mg via INTRAVENOUS
  Filled 2015-09-26: qty 13

## 2015-09-26 MED ORDER — PEGFILGRASTIM 6 MG/0.6ML ~~LOC~~ PSKT
6.0000 mg | PREFILLED_SYRINGE | Freq: Once | SUBCUTANEOUS | Status: AC
  Administered 2015-09-26: 6 mg via SUBCUTANEOUS
  Filled 2015-09-26: qty 0.6

## 2015-09-26 MED ORDER — SODIUM CHLORIDE 0.9 % IV SOLN
600.0000 mg/m2 | Freq: Once | INTRAVENOUS | Status: AC
  Administered 2015-09-26: 1020 mg via INTRAVENOUS
  Filled 2015-09-26: qty 51

## 2015-09-26 MED ORDER — SODIUM CHLORIDE 0.9 % IJ SOLN
10.0000 mL | INTRAMUSCULAR | Status: DC | PRN
  Administered 2015-09-26: 10 mL
  Filled 2015-09-26: qty 10

## 2015-09-26 MED ORDER — SODIUM CHLORIDE 0.9 % IV SOLN
Freq: Once | INTRAVENOUS | Status: AC
  Administered 2015-09-26: 13:00:00 via INTRAVENOUS
  Filled 2015-09-26: qty 8

## 2015-09-26 MED ORDER — HEPARIN SOD (PORK) LOCK FLUSH 100 UNIT/ML IV SOLN
500.0000 [IU] | Freq: Once | INTRAVENOUS | Status: AC | PRN
  Administered 2015-09-26: 500 [IU]
  Filled 2015-09-26: qty 5

## 2015-09-26 NOTE — Progress Notes (Signed)
ID: Crystal [name]   DOB: [birth year]  MR#: [id number]  WUJ#:[id number]  PCP: Kandice Hams, MD GYN: SU: [name] MD OTHER MD: Earle Gell, Daneen Schick, Arnoldo Hooker Thimmappa]  CHIEF COMPLAINT: recurrent estrogen receptor positive breast cancer  CURRENT TREATMENT: Awaiting definitive surgery  BREAST CANCER HISTORY:   From the original intake note:  The patient had bilateral diagnostic mammography 02/12/2011.  She has a history of cystic changes noted on prior studies in April 2011 and April 2010.  On the Feb 12, 2011 study there were new calcifications identified superiorly in the right breast, without any obvious mass.  The patient was brought back for further studies, 02/19/2011 and Dr. Marcelo Baldy was able to localize the calcifications under stereotactic guidance.  The biopsy ([id number]) showed high-grade ductal carcinoma with papillary features which was 70% estrogen receptor positive, 0% progesterone receptor positive.  There was no definitive invasion and therefore further studies were not performed.   Bilateral breast MRIs were obtained Feb 26, 2011.  This showed marked nodular enhancement throughout both breasts, and in the upper central portion there was a post biopsy area of change measuring up to 3.7 cm.  Most of this is a fluid cavity with a thin rim of enhancement.  There were no suspicious areas in the left breast.  No internal mammary or axillary lymph nodes.  Of course, the patient has a history of hepatic adenomas and these were noted incidentally on the MRI. Her definitive surgical treatment and subsequent history are as detailed below.  INTERVAL HISTORY: Crystal [name] returns today for follow up of her now recurrent breast cancer. Today is day 1, cycle 4 of cyclophosphamide and docetaxel, with neulasta onpro given on day 2 for granulocyte support.   REVIEW OF SYSTEMS: Crystal [name] is now on her 3rd week of the upper respiratory infection she has been battling, but it is better on  Sudafed. She denies fevers or chills. She has completed the course of antibiotics for her vulvular abscess, which as completely resolved. She denies nausea, vomiting, or changes in bowel or bladder habits. She has no mouth sores, rashes, or neuropathy symptoms. She has had no reflux while on omeprazole, so she plans to wean herself off of this once treatment is done. She managed to use less tramadol for her bone pain since her [name] visit. Her energy level is decent. A detailed review of systems is otherwise stable.  PAST MEDICAL HISTORY: Past Medical History  Diagnosis Date  . History of small bowel obstruction   . Liver masses   . Insomnia   . GERD (gastroesophageal reflux disease)   . Complication of anesthesia     hard to wake up  . Cancer [facility])     right breast  history of small-bowel obstruction with partial colectomy March 2001.  The patient has a history of hepatic adenomas.  She has had liver resections x2.  The data for this is at [facility] and we will try to obtain it.  She has also had incisional hernia repair.  She has a history of osteopenia, and she has a history of multiple anti-red cell antibodies making a transfusion difficult.  The patient tells me that she has a history of low calcium and a tendency to get liver injections.  PAST SURGICAL HISTORY: Past Surgical History  Procedure Laterality Date  . Liver surgery    . Laparotomies    . Hernia repair      incisional hernia repair x3  . Breast surgery Right   .  Mastectomy w/ sentinel node biopsy Right 07/05/2015    Procedure: RIGHT MASTECTOMY WITH SENTINEL NODE BIOPSY AND PORT A CATH INSERTION;  Surgeon: [name], MD;  Location: [place];  Service: General;  Laterality: Right;  . Portacath placement Left 07/05/2015    Procedure: INSERTION PORT-A-CATH;  Surgeon: [name], MD;  Location: [place];  Service: General;  Laterality: Left;    FAMILY HISTORY Family History  Problem  Relation Age of Onset  . Ovarian cancer Mother 65    "metastasized to breasts"  . Breast cancer Maternal Grandmother 48    2-3 recurrences  . Stomach cancer Maternal Grandfather     dx. 47-49; metastatic stomach cancer  . Colon polyps Father     about 2-3 polyps removed at each colonoscopy  . Colon polyps Brother     "a few"  . Other Maternal Aunt     +TAH  . Colon cancer Paternal Aunt 90  . Brain cancer Daughter 17    astrocytoma  . Breast cancer Cousin     dx. 53s  The patient's mother died from ovarian cancer at the age of 40.  The patient's father is alive at age 20.  She has 2 brothers, no sisters.  A maternal grandmother was diagnosed with breast cancer at the age of 75, and the maternal grandfather had stomach cancer at the age of 79.  GYNECOLOGIC HISTORY: The patient had menarche at age 25.  Her [name] menstrual period was in 2005.  She never used hormone replacement.  She used birth control briefly to control bleeding prior to 1980.  She was 59 years old at the birth of her first child.  She started having mammograms at age 68 because of a strong family history.  SOCIAL HISTORY: Aino works as a Programmer, applications for [employer].   She also heads the local "[employer]" Her husband, Temperence Zenor, is a retired Engineer, maintenance from a [employer] locally.  Son, [name], lives in [place] and works for that [employer]  Daughter, [name], lives in [place] and is a homemaker.   ADVANCED DIRECTIVES: in place  HEALTH MAINTENANCE: Social History  Substance Use Topics  . Smoking status: Never Smoker   . Smokeless tobacco: Never Used  . Alcohol Use: 0.6 oz/week    1 Glasses of wine per week     Comment: social     Colonoscopy:  PAP:  Bone density:  Lipid panel:  Allergies  Allergen Reactions  . Promethazine Hcl Other (See Comments)    Causes shaking  . Tylenol [Acetaminophen]     Avoids Tylenol products due to liver disease    . Penicillins Rash    Current Outpatient Prescriptions  Medication Sig Dispense Refill  . b complex vitamins tablet Take 1 tablet by mouth daily.      . calcium-vitamin D (OSCAL WITH D) 500-200 MG-UNIT per tablet Take 2 tablets by mouth daily.    . Carboxymethylcellul-Glycerin (CLEAR EYES FOR DRY EYES OP) Apply 1 drop to eye as needed.    Marland Kitchen dexamethasone (DECADRON) 4 MG tablet Take 2 tablets (8 mg total) by mouth 2 (two) times daily. Start the day before Taxotere. Then again the day after chemo for 3 days. 30 tablet 1  . famotidine (PEPCID AC) 10 MG chewable tablet Chew 10 mg by mouth daily as needed for heartburn.    Marland Kitchen glucosamine-chondroitin 500-400 MG tablet Take 1 tablet by mouth 3 (  three) times daily.      Marland Kitchen lidocaine-prilocaine (EMLA) cream Apply to affected area once 30 g 3  . LORazepam (ATIVAN) 0.5 MG tablet Take 1 tablet (0.5 mg total) by mouth at bedtime. 30 tablet 0  . Multiple Vitamins-Minerals (MULTIVITAMIN WITH MINERALS) tablet Take 1 tablet by mouth daily.      . ondansetron (ZOFRAN) 8 MG tablet Take 1 tablet (8 mg total) by mouth 2 (two) times daily. Start the day after chemo for 3 days. Then take as needed for nausea or vomiting. 30 tablet 1  . prochlorperazine (COMPAZINE) 10 MG tablet Take 1 tablet (10 mg total) by mouth every 6 (six) hours as needed (Nausea or vomiting). 30 tablet 1  . traMADol (ULTRAM) 50 MG tablet Take 1 tablet (50 mg total) by mouth every 6 (six) hours as needed. 60 tablet 2  . fish oil-omega-3 fatty acids 1000 MG capsule Take 2 g by mouth daily.      Marland Kitchen ibuprofen (ADVIL,MOTRIN) 200 MG tablet Take 200 mg by mouth every 6 (six) hours as needed.    . tobramycin-dexamethasone (TOBRADEX) ophthalmic solution Place 1 drop into both eyes 2 (two) times daily. (Patient not taking: Reported on 09/05/2015) 5 mL 1  . TURMERIC PO Take 400 mg by mouth daily. Pt takes 2 tablets 400 mg each= 800 mg total     No current facility-administered medications for this visit.     OBJECTIVE: Middle-aged white woman who appears younger than stated age  3 Vitals:   09/26/15 1126  BP: 138/41  Pulse: 85  Temp: 98.4 F (36.9 C)  Resp: 18     Body mass index is 22.66 kg/(m^2).    ECOG FS: 0  Skin: warm, dry  HEENT: sclerae anicteric, conjunctivae pink, oropharynx clear. No thrush or mucositis.  Lymph Nodes: No cervical or supraclavicular lymphadenopathy  Lungs: clear to auscultation bilaterally, no rales, wheezes, or rhonci  Heart: regular rate and rhythm  Abdomen: round, soft, non tender, positive bowel sounds  Musculoskeletal: No focal spinal tenderness, no peripheral edema  Neuro: non focal, well oriented, positive affect  Breasts: deferred  LAB RESULTS: Lab Results  Component Value Date   WBC 10.3 09/26/2015   NEUTROABS 9.2* 09/26/2015   HGB 10.5* 09/26/2015   HCT 32.8* 09/26/2015   MCV 91.2 09/26/2015   PLT 292 09/26/2015      Chemistry      Component Value Date/Time   NA 139 09/26/2015 1032   NA 139 03/10/2013 0848   K 3.9 09/26/2015 1032   K 3.9 03/10/2013 0848   CL 102 03/15/2013 0912   CL 100 03/10/2013 0848   CO2 22 09/26/2015 1032   CO2 23 03/10/2013 0848   BUN 17.9 09/26/2015 1032   BUN 20 03/10/2013 0848   CREATININE 0.9 09/26/2015 1032   CREATININE 0.69 03/10/2013 0848      Component Value Date/Time   CALCIUM 9.3 09/26/2015 1032   CALCIUM 10.0 03/10/2013 0848   ALKPHOS 140 09/26/2015 1032   ALKPHOS 131* 03/10/2013 0848   AST 12 09/26/2015 1032   AST 29 03/10/2013 0848   ALT 29 09/26/2015 1032   ALT 36* 03/10/2013 0848   BILITOT 0.30 09/26/2015 1032   BILITOT 0.3 03/10/2013 0848       Lab Results  Component Value Date   LABCA2 28 10/20/2012    No components found for: PPJKD326  No results for input(s): INR in the [name] 168 hours.  Urinalysis No results found for: COLORURINE  STUDIES: No results found.  ASSESSMENT: 59 y.o.  BRCA 1-2 and p-53 negative [place] woman status post right lumpectomy and  sentinel lymph node sampling June 2012 for a T1a N0, Stage IA  invasive ductal carcinoma, grade 3, estrogen receptor 98% positive, progesterone receptor 97% positive, with an MIB-1 of 5% and no HER-2 amplification   (a) note that the initial Right breast biopsy showed DCIS w papillary architecture, estrogen receptor 70% positive, progesterone receptor negative  (1) completed radiation September 2012.   (2) on letrozole since July 2012   RECURRENT DISEASE: (3) right breast upper outer quadrant biopsy 05/23/2015 was positive for a clinical T1c N0, stage IA invasive ductal carcinoma, grade 2, estrogen receptor 60% positive with moderate staining intensity, progesterone receptor negative, with no HER-2 ossification and an MIB-1 of 90%.  (4) status post right simple mastectomy 07/05/2015 for a pT1c pN0, stage IA invasive ductal carcinoma, grade 3, with negative margins. 4 lymph nodes removed with mastectomy were benign.  (5) to begin cyclophosphamide and docetaxel q21 days x 4, with neulasta onpro given on day 2 for granulocyte support  (6) genetics testing 07/20/2015 was normal and did not reveal a deleterious mutation in ATM, BARD1, BRCA1, BRCA2, BRIP1, CDH1, CHEK2, FANCC, MLH1, MSH2, MSH6, NBN, PALB2, PMS2, PTEN, RAD51C, RAD51D, TP53, and XRCC2. Additionally no variant of uncertain significance were found.   (7) RUL spiculated nodule measuring 1.2 cm noted on staging CT scan 06/13/2015-- stable on repeat CT 07/25/15  PLAN:  Crystal [name] is excited to be completing chemotherapy today. The labs were reviewed in detail and were entirely stable. She will proceed with cycle 4 of cyclophosphamide and docetaxel as planned.   She will return in 1 week for her final nadir visit with Dr. Jana Hakim. During this visit they will discuss a plan for surveillance of her right upper lobe spiculated nodule, that was stable on CT back in October. She understands and agrees with this plan. She has been encouraged to call  with any issues that might arise before her next visit here.   Genelle Gather Jalissa Heinzelman    09/26/2015

## 2015-09-26 NOTE — Patient Instructions (Signed)
[facility] Discharge Instructions for Patients Receiving Chemotherapy  Today you received the following chemotherapy agents Taxotere/Cytoxan.   To help prevent nausea and vomiting after your treatment, we encourage you to take your nausea medication as directed.    If you develop nausea and vomiting that is not controlled by your nausea medication, call the clinic.   BELOW ARE SYMPTOMS THAT SHOULD BE REPORTED IMMEDIATELY:  *FEVER GREATER THAN 100.5 F  *CHILLS WITH OR WITHOUT FEVER  NAUSEA AND VOMITING THAT IS NOT CONTROLLED WITH YOUR NAUSEA MEDICATION  *UNUSUAL SHORTNESS OF BREATH  *UNUSUAL BRUISING OR BLEEDING  TENDERNESS IN MOUTH AND THROAT WITH OR WITHOUT PRESENCE OF ULCERS  *URINARY PROBLEMS  *BOWEL PROBLEMS  UNUSUAL RASH Items with * indicate a potential emergency and should be followed up as soon as possible.  Feel free to call the clinic you have any questions or concerns. The clinic phone number is [phone].  Please show the [facility] at check-in to the Emergency Department and triage nurse.

## 2015-10-02 ENCOUNTER — Other Ambulatory Visit: Payer: Self-pay

## 2015-10-02 DIAGNOSIS — C50911 Malignant neoplasm of unspecified site of right female breast: Secondary | ICD-10-CM

## 2015-10-03 ENCOUNTER — Ambulatory Visit (HOSPITAL_BASED_OUTPATIENT_CLINIC_OR_DEPARTMENT_OTHER): Payer: BLUE CROSS/BLUE SHIELD | Admitting: Oncology

## 2015-10-03 ENCOUNTER — Other Ambulatory Visit (HOSPITAL_BASED_OUTPATIENT_CLINIC_OR_DEPARTMENT_OTHER): Payer: BLUE CROSS/BLUE SHIELD

## 2015-10-03 VITALS — BP 115/49 | HR 90 | Temp 98.6°F | Resp 18 | Ht 66.0 in | Wt 138.8 lb

## 2015-10-03 DIAGNOSIS — C50911 Malignant neoplasm of unspecified site of right female breast: Secondary | ICD-10-CM

## 2015-10-03 DIAGNOSIS — C50412 Malignant neoplasm of upper-outer quadrant of left female breast: Secondary | ICD-10-CM

## 2015-10-03 DIAGNOSIS — R05 Cough: Secondary | ICD-10-CM

## 2015-10-03 DIAGNOSIS — G8929 Other chronic pain: Secondary | ICD-10-CM | POA: Diagnosis not present

## 2015-10-03 DIAGNOSIS — M545 Low back pain: Secondary | ICD-10-CM

## 2015-10-03 DIAGNOSIS — D134 Benign neoplasm of liver: Secondary | ICD-10-CM | POA: Diagnosis not present

## 2015-10-03 DIAGNOSIS — C50811 Malignant neoplasm of overlapping sites of right female breast: Secondary | ICD-10-CM

## 2015-10-03 DIAGNOSIS — R918 Other nonspecific abnormal finding of lung field: Secondary | ICD-10-CM

## 2015-10-03 DIAGNOSIS — C50011 Malignant neoplasm of nipple and areola, right female breast: Secondary | ICD-10-CM

## 2015-10-03 LAB — COMPREHENSIVE METABOLIC PANEL
ALBUMIN: 3.6 g/dL (ref 3.5–5.0)
ALK PHOS: 142 U/L (ref 40–150)
ALT: 35 U/L (ref 0–55)
AST: 24 U/L (ref 5–34)
Anion Gap: 10 mEq/L (ref 3–11)
BILIRUBIN TOTAL: 0.34 mg/dL (ref 0.20–1.20)
BUN: 13.7 mg/dL (ref 7.0–26.0)
CO2: 26 mEq/L (ref 22–29)
Calcium: 9.1 mg/dL (ref 8.4–10.4)
Chloride: 101 mEq/L (ref 98–109)
Creatinine: 0.8 mg/dL (ref 0.6–1.1)
EGFR: 81 mL/min/{1.73_m2} — ABNORMAL LOW (ref >90)
GLUCOSE: 112 mg/dL (ref 70–140)
POTASSIUM: 4.2 meq/L (ref 3.5–5.1)
SODIUM: 137 meq/L (ref 136–145)
TOTAL PROTEIN: 6.4 g/dL (ref 6.4–8.3)

## 2015-10-03 LAB — CBC WITH DIFFERENTIAL/PLATELET
BASO%: 1.5 % (ref 0.0–2.0)
Basophils Absolute: 0.3 10*3/uL — ABNORMAL HIGH (ref 0.0–0.1)
EOS%: 0.5 % (ref 0.0–7.0)
Eosinophils Absolute: 0.1 10*3/uL (ref 0.0–0.5)
HCT: 34.9 % (ref 34.8–46.6)
HEMOGLOBIN: 11 g/dL — AB (ref 11.6–15.9)
LYMPH%: 9.8 % — ABNORMAL LOW (ref 14.0–49.7)
MCH: 28.8 pg (ref 25.1–34.0)
MCHC: 31.5 g/dL (ref 31.5–36.0)
MCV: 91.3 fL (ref 79.5–101.0)
MONO#: 1.8 10*3/uL — ABNORMAL HIGH (ref 0.1–0.9)
MONO%: 8.1 % (ref 0.0–14.0)
NEUT%: 80.1 % — ABNORMAL HIGH (ref 38.4–76.8)
NEUTROS ABS: 17.7 10*3/uL — AB (ref 1.5–6.5)
Platelets: 225 10*3/uL (ref 145–400)
RBC: 3.83 10*6/uL (ref 3.70–5.45)
RDW: 17.5 % — AB (ref 11.2–14.5)
WBC: 22 10*3/uL — AB (ref 3.9–10.3)
lymph#: 2.2 10*3/uL (ref 0.9–3.3)

## 2015-10-03 NOTE — Progress Notes (Signed)
ID: Crystal [name]   DOB: 1956-07-08  MR#: [id number]  ZYS#:[id number]  PCP: Kandice Hams, MD GYN: SU: [name] MD OTHER MD: Earle Gell, Daneen Schick, Arnoldo Hooker Thimmappa]  CHIEF COMPLAINT: recurrent estrogen receptor positive breast cancer  CURRENT TREATMENT: Antiestrogen therapy pending  BREAST CANCER HISTORY:   From the original intake note:  The patient had bilateral diagnostic mammography 02/12/2011.  She has a history of cystic changes noted on prior studies in April 2011 and April 2010.  On the Feb 12, 2011 study there were new calcifications identified superiorly in the right breast, without any obvious mass.  The patient was brought back for further studies, 02/19/2011 and Dr. Marcelo Baldy was able to localize the calcifications under stereotactic guidance.  The biopsy ([id number]) showed high-grade ductal carcinoma with papillary features which was 70% estrogen receptor positive, 0% progesterone receptor positive.  There was no definitive invasion and therefore further studies were not performed.   Bilateral breast MRIs were obtained Feb 26, 2011.  This showed marked nodular enhancement throughout both breasts, and in the upper central portion there was a post biopsy area of change measuring up to 3.7 cm.  Most of this is a fluid cavity with a thin rim of enhancement.  There were no suspicious areas in the left breast.  No internal mammary or axillary lymph nodes.  Of course, the patient has a history of hepatic adenomas and these were noted incidentally on the MRI. Her definitive surgical treatment and subsequent history are as detailed below.  INTERVAL HISTORY: Crystal [name] returns today for follow up of her estrogen receptor positive recurrent breast cancer. Today is day 8, cycle 4 of 4 planned cycles of cyclophosphamide and docetaxel, given 3 weeks apart. She is now ready for restaging and switching back to anti-estrogens  REVIEW OF SYSTEMS: Sandy tolerated the chemotherapy  generally well. She was not able to continue to work. She had significant bone pain with the Neulasta. She also developed a "cold" after cycle 3, and this is only now resolving. Usually the first week she was pretty tired but the next 2 weeks she had a bit more energy. She never developed any peripheral neuropathy, which is very favorable. She does have some shortness of breath when walking up stairs particularly, and still has that productive cough just mentioned. She has chronic low back pain which is not more intense or persistent than before. On one occasion she felt her upper legs were weak, but that has not recurred. Also [name] week when entering the shower her hands became below. She rubbed them and that resolved. That also has not happened since. A detailed review of systems was otherwise stable  PAST MEDICAL HISTORY: Past Medical History  Diagnosis Date  . History of small bowel obstruction   . Liver masses   . Insomnia   . GERD (gastroesophageal reflux disease)   . Complication of anesthesia     hard to wake up  . Cancer [facility])     right breast  history of small-bowel obstruction with partial colectomy March 2001.  The patient has a history of hepatic adenomas.  She has had liver resections x2.  The data for this is at [facility] and we will try to obtain it.  She has also had incisional hernia repair.  She has a history of osteopenia, and she has a history of multiple anti-red cell antibodies making a transfusion difficult.  The patient tells me that she has a history of low calcium and a tendency  to get liver injections.  PAST SURGICAL HISTORY: Past Surgical History  Procedure Laterality Date  . Liver surgery    . Laparotomies    . Hernia repair      incisional hernia repair x3  . Breast surgery Right   . Mastectomy w/ sentinel node biopsy Right 07/05/2015    Procedure: RIGHT MASTECTOMY WITH SENTINEL NODE BIOPSY AND PORT A CATH INSERTION;  Surgeon: [name], MD;  Location: [place];  Service: General;  Laterality: Right;  . Portacath placement Left 07/05/2015    Procedure: INSERTION PORT-A-CATH;  Surgeon: [name], MD;  Location: [place];  Service: General;  Laterality: Left;    FAMILY HISTORY Family History  Problem Relation Age of Onset  . Ovarian cancer Mother 35    "metastasized to breasts"  . Breast cancer Maternal Grandmother 48    2-3 recurrences  . Stomach cancer Maternal Grandfather     dx. 47-49; metastatic stomach cancer  . Colon polyps Father     about 2-3 polyps removed at each colonoscopy  . Colon polyps Brother     "a few"  . Other Maternal Aunt     +TAH  . Colon cancer Paternal Aunt 90  . Brain cancer Daughter 17    astrocytoma  . Breast cancer Cousin     dx. 39s  The patient's mother died from ovarian cancer at the age of 70.  The patient's father is alive at age 52.  She has 2 brothers, no sisters.  A maternal grandmother was diagnosed with breast cancer at the age of 28, and the maternal grandfather had stomach cancer at the age of 81.  GYNECOLOGIC HISTORY: The patient had menarche at age 18.  Her [name] menstrual period was in 2005.  She never used hormone replacement.  She used birth control briefly to control bleeding prior to 1980.  She was 59 years old at the birth of her first child.  She started having mammograms at age 64 because of a strong family history.  SOCIAL HISTORY: Haniah works as a Programmer, applications for [employer].   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  Son, Crystal [name], lives in [place] and works for that [employer]  Daughter, Crystal [name], lives in [place] and is a homemaker.   ADVANCED DIRECTIVES: in place  HEALTH MAINTENANCE: Social History  Substance Use Topics  . Smoking status: Never Smoker   . Smokeless tobacco: Never Used  . Alcohol Use: 0.6  oz/week    1 Glasses of wine per week     Comment: social     Colonoscopy:  PAP:  Bone density:  Lipid panel:  Allergies  Allergen Reactions  . Promethazine Hcl Other (See Comments)    Causes shaking  . Tylenol [Acetaminophen]     Avoids Tylenol products due to liver disease   . Penicillins Rash    Current Outpatient Prescriptions  Medication Sig Dispense Refill  . b complex vitamins tablet Take 1 tablet by mouth daily.      . calcium-vitamin D (OSCAL WITH D) 500-200 MG-UNIT per tablet Take 2 tablets by mouth daily.    . Carboxymethylcellul-Glycerin (CLEAR EYES FOR DRY EYES OP) Apply 1 drop to eye as needed.    Marland Kitchen dexamethasone (DECADRON) 4 MG tablet Take 2 tablets (8 mg total) by mouth 2 (two) times daily. Start the day  before Taxotere. Then again the day after chemo for 3 days. 30 tablet 1  . famotidine (PEPCID AC) 10 MG chewable tablet Chew 10 mg by mouth daily as needed for heartburn.    . fish oil-omega-3 fatty acids 1000 MG capsule Take 2 g by mouth daily.      Marland Kitchen glucosamine-chondroitin 500-400 MG tablet Take 1 tablet by mouth 3 (three) times daily.      Marland Kitchen ibuprofen (ADVIL,MOTRIN) 200 MG tablet Take 200 mg by mouth every 6 (six) hours as needed.    . lidocaine-prilocaine (EMLA) cream Apply to affected area once 30 g 3  . LORazepam (ATIVAN) 0.5 MG tablet Take 1 tablet (0.5 mg total) by mouth at bedtime. 30 tablet 0  . Multiple Vitamins-Minerals (MULTIVITAMIN WITH MINERALS) tablet Take 1 tablet by mouth daily.      . ondansetron (ZOFRAN) 8 MG tablet Take 1 tablet (8 mg total) by mouth 2 (two) times daily. Start the day after chemo for 3 days. Then take as needed for nausea or vomiting. 30 tablet 1  . prochlorperazine (COMPAZINE) 10 MG tablet Take 1 tablet (10 mg total) by mouth every 6 (six) hours as needed (Nausea or vomiting). 30 tablet 1  . tobramycin-dexamethasone (TOBRADEX) ophthalmic solution Place 1 drop into both eyes 2 (two) times daily. (Patient not taking: Reported on  09/05/2015) 5 mL 1  . traMADol (ULTRAM) 50 MG tablet Take 1 tablet (50 mg total) by mouth every 6 (six) hours as needed. 60 tablet 2  . TURMERIC PO Take 400 mg by mouth daily. Pt takes 2 tablets 400 mg each= 800 mg total     No current facility-administered medications for this visit.    OBJECTIVE: Middle-aged white woman in no acute distress Filed Vitals:   10/03/15 1002  BP: 115/49  Pulse: 90  Temp: 98.6 F (37 C)  Resp: 18     Body mass index is 22.41 kg/(m^2).    ECOG FS: 1  Sclerae unicteric, pupils round and equal Oropharynx clear, slightly dry  No cervical or supraclavicular adenopathy Lungs no rales or rhonchi Heart regular rate and rhythm Abd soft, nontender, positive bowel sounds MSK no focal spinal tenderness, no upper extremity lymphedema Neuro: nonfocal, well oriented, appropriate affect Breasts: deferred    LAB RESULTS: Lab Results  Component Value Date   WBC 22.0* 10/03/2015   NEUTROABS 17.7* 10/03/2015   HGB 11.0* 10/03/2015   HCT 34.9 10/03/2015   MCV 91.3 10/03/2015   PLT 225 10/03/2015      Chemistry      Component Value Date/Time   NA 137 10/03/2015 0842   NA 139 03/10/2013 0848   K 4.2 10/03/2015 0842   K 3.9 03/10/2013 0848   CL 102 03/15/2013 0912   CL 100 03/10/2013 0848   CO2 26 10/03/2015 0842   CO2 23 03/10/2013 0848   BUN 13.7 10/03/2015 0842   BUN 20 03/10/2013 0848   CREATININE 0.8 10/03/2015 0842   CREATININE 0.69 03/10/2013 0848      Component Value Date/Time   CALCIUM 9.1 10/03/2015 0842   CALCIUM 10.0 03/10/2013 0848   ALKPHOS 142 10/03/2015 0842   ALKPHOS 131* 03/10/2013 0848   AST 24 10/03/2015 0842   AST 29 03/10/2013 0848   ALT 35 10/03/2015 0842   ALT 36* 03/10/2013 0848   BILITOT 0.34 10/03/2015 0842   BILITOT 0.3 03/10/2013 0848       Lab Results  Component Value Date   LABCA2 28 10/20/2012  No components found for: LABCA125  No results for input(s): INR in the [name] 168 hours.  Urinalysis No  results found for: COLORURINE  STUDIES: CLINICAL DATA: Subsequent treatment strategy for breast carcinoma.Breast carcinoma diagnosis 2012 and 2016. Recurrent RIGHT breastcance  EXAM: CT CHEST WITH CONTRAST  TECHNIQUE: Multidetector CT imaging of the chest was performed during intravenous contrast administration.  CONTRAST: 65m OMNIPAQUE IOHEXOL 300 MG/ML SOLN  COMPARISON: CT 06/13/2015, MRI 03/04/2011  FINDINGS: Mediastinum/Nodes: Postsurgical change consistent with RIGHT mastectomy. There stranding in the RIGHT axilla related to nodal dissection. No LEFT axillary adenopathy. No supraclavicular adenopathy. No internal mammary adenopathy.  No mediastinal hilar adenopathy. No pericardial fluid.  Lungs/Pleura: Spiculated nodule in the RIGHT upper lobe measures 10 x 14 x 14 mm compared to 11 x 13 x 14 mm on prior remeasured. No interval change. No new pulmonary nodules.  Upper abdomen: Limited view of the upper abdomen demonstrates multiple lesions within the liver. These are described as benign adenomas on MRI in 2012 and are not changed from recent CT. adrenal glands are normal. There are surgical clips along the RIGHT hepatic lobe.  Musculoskeletal: No aggressive osseous lesion.  IMPRESSION: 1. Stable spiculated nodule in the RIGHT upper lobe over 2 month interval. Recommend CT follow-up versus FDG PET scan. 2. Interval RIGHT mastectomy. 3. Stable lesions within liver compared with MRI of 2012.   Electronically Signed  By: SSuzy BouchardM.D.  On: 07/25/2015 17:25    ASSESSMENT: 59y.o.  BRCA 1-2 and p-53 negative [place] woman status post right lumpectomy and sentinel lymph node sampling June 2012 for a T1a N0, Stage IA  invasive ductal carcinoma, grade 3, estrogen receptor 98% positive, progesterone receptor 97% positive, with an MIB-1 of 5% and no HER-2 amplification   (a) note that the initial Right breast biopsy showed DCIS w papillary  architecture, estrogen receptor 70% positive, progesterone receptor negative  (1) completed radiation September 2012.   (2) on letrozole starting July 2012, discontinued August 2016 with recurrence  RECURRENT DISEASE: (3) right breast upper outer quadrant biopsy 05/23/2015 was positive for a clinical T1c N0, stage IA invasive ductal carcinoma, grade 2, estrogen receptor 60% positive with moderate staining intensity, progesterone receptor negative, with no HER-2 ossification and an MIB-1 of 90%.  (4) status post right simple mastectomy 07/05/2015 for a pT1c pN0, stage IA invasive ductal carcinoma, grade 3, with negative margins. 4 lymph nodes removed with mastectomy were benign.  (5) began cyclophosphamide and docetaxel q21 days 07/26/2015, repeated every 21 days 4, final dose 09/26/2015  (6) genetics testing 07/20/2015 was normal and did not reveal a deleterious mutation in ATM, BARD1, BRCA1, BRCA2, BRIP1, CDH1, CHEK2, FANCC, MLH1, MSH2, MSH6, NBN, PALB2, PMS2, PTEN, RAD51C, RAD51D, TP53, and XRCC2. Additionally no variant of uncertain significance were found.   (7) RUL spiculated nodule measuring 1.2 cm noted on staging CT scan 06/13/2015-- stable on repeat CT 07/25/15  (8) history of multiple hepatic adenomas  PLAN:  SLovey Newcomerhas completed her adjuvant chemotherapy for her recurrent breast cancer. She did generally well with the treatment and did not require any dose reductions or delays.  We need to restage her and she has a nodule in the right upper lung which has been evaluated with CTs previously. It will be helpful to know whether this is "hot" on PET scan and I am scheduling her for that study later this month.  We are now ready to return to anti-estrogens. This however presents a real problem. The tumor  recurred on letrozole, an aromatase inhibitor. That suggests that returning to letrozole or similar drugs I would not be the best strategy.  Tamoxifen would be an option except for  her history of multiple hepatic adenomas, which contraindicates that drug.  I think 2 options would be 1 fulvestrant, or 2, anastrozole plus Palbociclib. We discussed both options in detail. She is drawn to the fulvestrant option and we have set her up for the first dose on January 9. She is going to return to see me the next week to make sure she will tolerate that well and if she does the plan will be to go on fulvestrant as per the usual protocol for a total of 5 years.  At the next visit also we will decide whether to remove the port.  She has a good understanding of the overall plan. She agrees with it. She will call with any problems that may develop before her next visit here. MAGRINAT,GUSTAV C    10/03/2015

## 2015-10-04 ENCOUNTER — Telehealth: Payer: Self-pay | Admitting: Oncology

## 2015-10-04 ENCOUNTER — Telehealth: Payer: Self-pay

## 2015-10-04 NOTE — Telephone Encounter (Signed)
Patient called and had questions about the medications that were discussed at her appointment yesterday- fulvestrant, anastrozole and palbociclib.  She has been reading information about the faslodex and seems a bit anxious about it.Marland Kitchen She wanted to make sure it wasn't a "clinical trial" medication.  Writer reassured her it was not.  Patient is scheduled for three injections pending the approval of her insurance company.

## 2015-10-04 NOTE — Telephone Encounter (Signed)
Aware of new upcoming appointments

## 2015-10-23 ENCOUNTER — Ambulatory Visit (HOSPITAL_BASED_OUTPATIENT_CLINIC_OR_DEPARTMENT_OTHER): Payer: BLUE CROSS/BLUE SHIELD

## 2015-10-23 VITALS — BP 138/62 | HR 73 | Temp 97.9°F

## 2015-10-23 DIAGNOSIS — Z5111 Encounter for antineoplastic chemotherapy: Secondary | ICD-10-CM | POA: Diagnosis not present

## 2015-10-23 DIAGNOSIS — C50811 Malignant neoplasm of overlapping sites of right female breast: Secondary | ICD-10-CM

## 2015-10-23 DIAGNOSIS — C50911 Malignant neoplasm of unspecified site of right female breast: Secondary | ICD-10-CM | POA: Diagnosis not present

## 2015-10-23 MED ORDER — FULVESTRANT 250 MG/5ML IM SOLN
500.0000 mg | INTRAMUSCULAR | Status: DC
  Administered 2015-10-23: 500 mg via INTRAMUSCULAR
  Filled 2015-10-23: qty 10

## 2015-10-23 NOTE — Patient Instructions (Signed)

## 2015-10-31 ENCOUNTER — Other Ambulatory Visit: Payer: Self-pay | Admitting: Oncology

## 2015-10-31 ENCOUNTER — Ambulatory Visit (HOSPITAL_COMMUNITY)
Admission: RE | Admit: 2015-10-31 | Discharge: 2015-10-31 | Disposition: A | Discharge status: Home / Self Care | Payer: BLUE CROSS/BLUE SHIELD | Source: Ambulatory Visit | Attending: Oncology | Admitting: Oncology

## 2015-10-31 DIAGNOSIS — K769 Liver disease, unspecified: Secondary | ICD-10-CM | POA: Diagnosis not present

## 2015-10-31 DIAGNOSIS — R911 Solitary pulmonary nodule: Secondary | ICD-10-CM | POA: Insufficient documentation

## 2015-10-31 DIAGNOSIS — C50011 Malignant neoplasm of nipple and areola, right female breast: Secondary | ICD-10-CM

## 2015-10-31 DIAGNOSIS — Z0189 Encounter for other specified special examinations: Secondary | ICD-10-CM | POA: Diagnosis present

## 2015-10-31 LAB — GLUCOSE, CAPILLARY: Glucose-Capillary: 86 mg/dL (ref 65–99)

## 2015-10-31 MED ORDER — FLUDEOXYGLUCOSE F - 18 (FDG) INJECTION
6.8000 | Freq: Once | INTRAVENOUS | Status: AC | PRN
  Administered 2015-10-31: 6.8 via INTRAVENOUS

## 2015-11-03 ENCOUNTER — Other Ambulatory Visit: Payer: Self-pay | Admitting: *Deleted

## 2015-11-03 DIAGNOSIS — C50811 Malignant neoplasm of overlapping sites of right female breast: Secondary | ICD-10-CM

## 2015-11-06 ENCOUNTER — Ambulatory Visit (HOSPITAL_BASED_OUTPATIENT_CLINIC_OR_DEPARTMENT_OTHER): Payer: BLUE CROSS/BLUE SHIELD

## 2015-11-06 ENCOUNTER — Ambulatory Visit (HOSPITAL_BASED_OUTPATIENT_CLINIC_OR_DEPARTMENT_OTHER): Payer: BLUE CROSS/BLUE SHIELD | Admitting: Oncology

## 2015-11-06 ENCOUNTER — Telehealth: Payer: Self-pay | Admitting: Oncology

## 2015-11-06 ENCOUNTER — Other Ambulatory Visit (HOSPITAL_BASED_OUTPATIENT_CLINIC_OR_DEPARTMENT_OTHER): Payer: BLUE CROSS/BLUE SHIELD

## 2015-11-06 VITALS — BP 135/57 | HR 85 | Temp 98.6°F | Resp 18 | Ht 66.0 in | Wt 141.1 lb

## 2015-11-06 DIAGNOSIS — Z1379 Encounter for other screening for genetic and chromosomal anomalies: Secondary | ICD-10-CM

## 2015-11-06 DIAGNOSIS — C50412 Malignant neoplasm of upper-outer quadrant of left female breast: Secondary | ICD-10-CM

## 2015-11-06 DIAGNOSIS — Z5111 Encounter for antineoplastic chemotherapy: Secondary | ICD-10-CM | POA: Diagnosis not present

## 2015-11-06 DIAGNOSIS — C50811 Malignant neoplasm of overlapping sites of right female breast: Secondary | ICD-10-CM

## 2015-11-06 DIAGNOSIS — C50911 Malignant neoplasm of unspecified site of right female breast: Secondary | ICD-10-CM

## 2015-11-06 DIAGNOSIS — D134 Benign neoplasm of liver: Secondary | ICD-10-CM | POA: Insufficient documentation

## 2015-11-06 DIAGNOSIS — R918 Other nonspecific abnormal finding of lung field: Secondary | ICD-10-CM | POA: Diagnosis not present

## 2015-11-06 LAB — CBC WITH DIFFERENTIAL/PLATELET
BASO%: 1 % (ref 0.0–2.0)
BASOS ABS: 0.1 10*3/uL (ref 0.0–0.1)
EOS ABS: 0.1 10*3/uL (ref 0.0–0.5)
EOS%: 1.2 % (ref 0.0–7.0)
HEMATOCRIT: 39.3 % (ref 34.8–46.6)
HEMOGLOBIN: 12.9 g/dL (ref 11.6–15.9)
LYMPH#: 1.4 10*3/uL (ref 0.9–3.3)
LYMPH%: 23.1 % (ref 14.0–49.7)
MCH: 29.5 pg (ref 25.1–34.0)
MCHC: 32.7 g/dL (ref 31.5–36.0)
MCV: 90.3 fL (ref 79.5–101.0)
MONO#: 0.3 10*3/uL (ref 0.1–0.9)
MONO%: 5.7 % (ref 0.0–14.0)
NEUT%: 69 % (ref 38.4–76.8)
NEUTROS ABS: 4.1 10*3/uL (ref 1.5–6.5)
PLATELETS: 199 10*3/uL (ref 145–400)
RBC: 4.36 10*6/uL (ref 3.70–5.45)
RDW: 14.9 % — ABNORMAL HIGH (ref 11.2–14.5)
WBC: 6 10*3/uL (ref 3.9–10.3)

## 2015-11-06 LAB — COMPREHENSIVE METABOLIC PANEL
ALBUMIN: 4.1 g/dL (ref 3.5–5.0)
ALT: 29 U/L (ref 0–55)
ANION GAP: 11 meq/L (ref 3–11)
AST: 23 U/L (ref 5–34)
Alkaline Phosphatase: 141 U/L (ref 40–150)
BILIRUBIN TOTAL: 0.35 mg/dL (ref 0.20–1.20)
BUN: 14.4 mg/dL (ref 7.0–26.0)
CALCIUM: 9.7 mg/dL (ref 8.4–10.4)
CO2: 25 mEq/L (ref 22–29)
CREATININE: 0.8 mg/dL (ref 0.6–1.1)
Chloride: 105 mEq/L (ref 98–109)
EGFR: 77 mL/min/{1.73_m2} — ABNORMAL LOW (ref >90)
Glucose: 144 mg/dl — ABNORMAL HIGH (ref 70–140)
Potassium: 4 mEq/L (ref 3.5–5.1)
Sodium: 141 mEq/L (ref 136–145)
TOTAL PROTEIN: 7.1 g/dL (ref 6.4–8.3)

## 2015-11-06 MED ORDER — FULVESTRANT 250 MG/5ML IM SOLN
500.0000 mg | INTRAMUSCULAR | Status: DC
  Administered 2015-11-06: 500 mg via INTRAMUSCULAR
  Filled 2015-11-06: qty 10

## 2015-11-06 NOTE — Progress Notes (Signed)
ID: Crystal [name]   DOB: 09/06/1956  MR#: [id number]  NOM#:[id number]  PCP: Crystal Hams, MD GYN: SU: Crystal Keens MD OTHER MD: Crystal [name], Crystal [name], Crystal [name]]  CHIEF COMPLAINT: recurrent estrogen receptor positive breast cancer in previously irradiated breast  CURRENT TREATMENT: Fulvestrant  BREAST CANCER HISTORY:   From the original intake note:  The patient had bilateral diagnostic mammography 02/12/2011.  She has a history of cystic changes noted on prior studies in April 2011 and April 2010.  On the Feb 12, 2011 study there were new calcifications identified superiorly in the right breast, without any obvious mass.  The patient was brought back for further studies, 02/19/2011 and Dr. Marcelo [name] was able to localize the calcifications under stereotactic guidance.  The biopsy ([id number]) showed high-grade ductal carcinoma with papillary features which was 70% estrogen receptor positive, 0% progesterone receptor positive.  There was no definitive invasion and therefore further studies were not performed.   Bilateral breast MRIs were obtained Feb 26, 2011.  This showed marked nodular enhancement throughout both breasts, and in the upper central portion there was a post biopsy area of change measuring up to 3.7 cm.  Most of this is a fluid cavity with a thin rim of enhancement.  There were no suspicious areas in the left breast.  No internal mammary or axillary lymph nodes.  Of course, the patient has a history of hepatic adenomas and these were noted incidentally on the MRI.   Her definitive surgical treatment and subsequent history are as detailed below.  INTERVAL HISTORY: Crystal [name] returns today for follow up of her recurrent breast cancer and lung mass. Today she will receive her second dose of fulvestrant. She received a first dose 2 weeks ago, of course, and she did moderately well with that. She was fatigued for 2 or 3 days. She felt some quads weakness, and she is doing  extra stair climbing to overcome that. She is also walking outside when the weather permits. She did have some pain from the shots and she felt it was given perhaps a little too fast and a little too high.  REVIEW OF SYSTEMS: Crystal [name] had a viral upper respiratory infection which he set her back a few days. That has largely resolved although she still feels a little congested and she had some headaches for which she took nonsteroidal analgesics. She has also experienced more hot flashes: Both increased intensity and increased frequency. 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 Aside from these issues a detailed review of systems today was stable  PAST MEDICAL HISTORY: Past Medical History  Diagnosis Date  . History of small bowel obstruction   . Liver masses   . Insomnia   . GERD (gastroesophageal reflux disease)   . Complication of anesthesia     hard to wake up  . Cancer [facility])     right breast  history of small-bowel obstruction with partial colectomy March 2001.  The patient has a history of hepatic adenomas.  She has had liver resections x2.  The data for this is at [facility] and we will try to obtain it.  She has also had incisional hernia repair.  She has a history of osteopenia, and she has a history of multiple anti-red cell antibodies making a transfusion difficult.  The patient tells me  that she has a history of low calcium and a tendency to get liver injections.  PAST SURGICAL HISTORY: Past Surgical History  Procedure Laterality Date  . Liver surgery    . Laparotomies    . Hernia repair      incisional hernia repair x3  . Breast surgery Right   . Mastectomy w/ sentinel node biopsy Right 07/05/2015    Procedure: RIGHT MASTECTOMY WITH SENTINEL NODE BIOPSY AND PORT A CATH INSERTION;  Surgeon: Crystal Keens, MD;   Location: [facility];  Service: General;  Laterality: Right;  . Portacath placement Left 07/05/2015    Procedure: INSERTION PORT-A-CATH;  Surgeon: Crystal Keens, MD;  Location: [place];  Service: General;  Laterality: Left;    FAMILY HISTORY Family History  Problem Relation Age of Onset  . Ovarian cancer Mother 50    "metastasized to breasts"  . Breast cancer Maternal Grandmother 48    2-3 recurrences  . Stomach cancer Maternal Grandfather     dx. 47-49; metastatic stomach cancer  . Colon polyps Father     about 2-3 polyps removed at each colonoscopy  . Colon polyps Brother     "a few"  . Other Maternal Aunt     +TAH  . Colon cancer Paternal Aunt 90  . Brain cancer Daughter 17    astrocytoma  . Breast cancer Cousin     dx. 85s  The patient's mother died from ovarian cancer at the age of 70.  The patient's father is alive at age 48.  She has 2 brothers, no sisters.  A maternal grandmother was diagnosed with breast cancer at the age of 55, and the maternal grandfather had stomach cancer at the age of 48.  GYNECOLOGIC HISTORY: The patient had menarche at age 60.  Her [name] menstrual period was in 2005.  She never used hormone replacement.  She used birth control briefly to control bleeding prior to 1980.  She was 60 years old at the birth of her first child.  She started having mammograms at age 60 because of a strong family history.  SOCIAL HISTORY: Crystal [name] works as a Programmer, applications for [employer].   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  Son, Crystal [name], lives in [place] and works for that [employer]  Daughter, Crystal [name], lives in [place] and is a homemaker.   ADVANCED DIRECTIVES: in place  HEALTH MAINTENANCE: Social History  Substance Use Topics  . Smoking status: Never Smoker   . Smokeless tobacco: Never Used  .  Alcohol Use: 0.6 oz/week    1 Glasses of wine per week     Comment: social     Colonoscopy:  PAP:  Bone density:  Lipid panel:  Allergies  Allergen Reactions  . Promethazine Hcl Other (See Comments)    Causes shaking  . Tylenol [Acetaminophen]     Avoids Tylenol products due to liver disease   . Penicillins Rash    Current Outpatient Prescriptions  Medication Sig Dispense Refill  . b complex vitamins tablet Take 1 tablet by mouth daily.      . calcium-vitamin D (OSCAL WITH D) 500-200 MG-UNIT per tablet Take 2 tablets by mouth daily.    . Carboxymethylcellul-Glycerin (CLEAR EYES FOR DRY EYES OP) Apply 1 drop to eye as needed.    . famotidine (PEPCID AC) 10 MG chewable tablet Chew 10  mg by mouth daily as needed for heartburn.    . fish oil-omega-3 fatty acids 1000 MG capsule Take 2 g by mouth daily.      Marland Kitchen glucosamine-chondroitin 500-400 MG tablet Take 1 tablet by mouth 3 (three) times daily.      Marland Kitchen ibuprofen (ADVIL,MOTRIN) 200 MG tablet Take 200 mg by mouth every 6 (six) hours as needed.    . lidocaine-prilocaine (EMLA) cream Apply to affected area once 30 g 3  . LORazepam (ATIVAN) 0.5 MG tablet Take 1 tablet (0.5 mg total) by mouth at bedtime. 30 tablet 0  . Multiple Vitamins-Minerals (MULTIVITAMIN WITH MINERALS) tablet Take 1 tablet by mouth daily.      . TURMERIC PO Take 500 mg by mouth daily.     No current facility-administered medications for this visit.   Facility-Administered Medications Ordered in Other Visits  Medication Dose Route Frequency Provider [name] Rate [name] Dose  . fulvestrant (FASLODEX) injection 500 mg  500 mg Intramuscular Q14 Days Chauncey Cruel, MD   500 mg at 11/06/15 2353    OBJECTIVE: Middle-aged white woman who appears stated age 60 Vitals:   11/06/15 0837  BP: 135/57  Pulse: 85  Temp: 98.6 F (37 C)  Resp: 18     Body mass index is 22.79 kg/(m^2).    ECOG FS: 1  Sclerae unicteric, EOMs intact Oropharynx clear, dentition in good  repair No cervical or supraclavicular adenopathy Lungs no rales or rhonchi Heart regular rate and rhythm Abd soft, nontender, positive bowel sounds MSK no focal spinal tenderness, no upper extremity lymphedema Neuro: nonfocal, well oriented, appropriate affect Breasts: The right breast is status post mastectomy. The incision has healed very nicely area there is no evidence of local recurrence. The right axilla is benign. Left breast is unremarkable.   LAB RESULTS: Lab Results  Component Value Date   WBC 6.0 11/06/2015   NEUTROABS 4.1 11/06/2015   HGB 12.9 11/06/2015   HCT 39.3 11/06/2015   MCV 90.3 11/06/2015   PLT 199 11/06/2015      Chemistry      Component Value Date/Time   NA 141 11/06/2015 0807   NA 139 03/10/2013 0848   K 4.0 11/06/2015 0807   K 3.9 03/10/2013 0848   CL 102 03/15/2013 0912   CL 100 03/10/2013 0848   CO2 25 11/06/2015 0807   CO2 23 03/10/2013 0848   BUN 14.4 11/06/2015 0807   BUN 20 03/10/2013 0848   CREATININE 0.8 11/06/2015 0807   CREATININE 0.69 03/10/2013 0848      Component Value Date/Time   CALCIUM 9.7 11/06/2015 0807   CALCIUM 10.0 03/10/2013 0848   ALKPHOS 141 11/06/2015 0807   ALKPHOS 131* 03/10/2013 0848   AST 23 11/06/2015 0807   AST 29 03/10/2013 0848   ALT 29 11/06/2015 0807   ALT 36* 03/10/2013 0848   BILITOT 0.35 11/06/2015 0807   BILITOT 0.3 03/10/2013 0848       Lab Results  Component Value Date   LABCA2 28 10/20/2012    No components found for: [id number]  No results for input(s): INR in the [name] 168 hours.  Urinalysis No results found for: COLORURINE  STUDIES: Nm Pet Image Restag (ps) Skull Base To Thigh  10/31/2015  CLINICAL DATA:  Initial treatment strategy for spiculated 14 mm right upper lobe pulmonary nodule in a patient with personal history of breast cancer. EXAM: NUCLEAR MEDICINE PET SKULL BASE TO THIGH TECHNIQUE: 6.9 mCi F-18 FDG was injected intravenously.  Full-ring PET imaging was performed from the  skull base to thigh after the radiotracer. CT data was obtained and used for attenuation correction and anatomic localization. FASTING BLOOD GLUCOSE:  Value: 86 first the mg/dl COMPARISON:  None. FINDINGS: NECK No hypermetabolic lymph nodes in the neck. CHEST 1.4 cm spiculated anterior right upper lobe nodule is visualized on image 54 of series 4. This has evidence of tethering to the anterior pleura. On PET imaging, the lesion is mildly hypermetabolic with SUV max = 2.2. No other hypermetabolic pulmonary nodule. No hypermetabolic lymphadenopathy in the mediastinum or either hilum. Left Port-A-Cath tip is positioned in the mid to distal SVC. ABDOMEN/PELVIS Surgical changes are compatible with previous right hepatectomy. The patient has numerous hypermetabolic lesions in the liver of varying sizes. MRI of 03/04/2011 documented too numerous to count fat containing lesions ranging in size from 1-7 cm compatible with adenomas. Index lesion seen on image 98 of series 4 today measures 4.9 cm and when I remeasure it on the previous MRI of 03/04/2011, this same lesion was 5.1 cm. The hepatic lesions have been present on imaging studies, by report, dating back to 10/02/1994. No abnormal hypermetabolic activity within the pancreas, adrenal glands, or spleen. No hypermetabolic lymph nodes in the abdomen or pelvis. SKELETON Marrow activity is heterogeneous and nonspecific. No scintigraphic findings to suggest bony metastatic disease on bone scan performed without 5 months ago. IMPRESSION: 1. Evidence of FDG accumulation in a small spiculated right upper lobe pulmonary nodule. While the uptake is low level, given the morphologic features of this nodule along with the discernible FDG uptake, neoplasm must be considered. This could represent primary bronchogenic neoplasm versus metastatic disease from breast cancer. 2. Innumerable heterogeneous lesions scattered throughout the liver in this patient with evidence of prior partial  hepatectomy. These liver lesions have been present since 1995 and are seen to be hypermetabolic on today's PET images. Hypermetabolism is known to occur in hepatocellular adenoma. Given the volume of hypermetabolic lesions scattered throughout the liver, hypermetabolic liver metastases would be difficult to detect. Electronically Signed   By: Misty Stanley M.D.   On: 10/31/2015 10:57      ASSESSMENT: 60 y.o.  BRCA 1-2 and p-53 negative Noank woman status post right lumpectomy and sentinel lymph node sampling June 2012 for a T1a N0, Stage IA  invasive ductal carcinoma, grade 3, estrogen receptor 98% positive, progesterone receptor 97% positive, with an MIB-1 of 5% and no HER-2 amplification   (a) note that the initial Right breast biopsy showed DCIS w papillary architecture, estrogen receptor 70% positive, progesterone receptor negative  (1) completed radiation September 2012.   (2) on letrozole starting July 2012, discontinued August 2016 with recurrence  RECURRENT DISEASE: (3) right breast upper outer quadrant biopsy 05/23/2015 was positive for a clinical T1c N0, stage IA invasive ductal carcinoma, grade 2, estrogen receptor 60% positive with moderate staining intensity, progesterone receptor negative, with no HER-2 ossification and an MIB-1 of 90%.  (4) status post right simple mastectomy 07/05/2015 for a pT1c pN0, stage IA invasive ductal carcinoma, grade 3, with negative margins. 4 lymph nodes removed with mastectomy were benign.  (a) the patient is not planning on reconstruction  (5) began cyclophosphamide and docetaxel 07/26/2015, repeated every 21 days 4, final dose 09/26/2015  (6) genetics testing 07/20/2015 was normal and did not reveal a deleterious mutation in ATM, BARD1, BRCA1, BRCA2, BRIP1, CDH1, CHEK2, FANCC, MLH1, MSH2, MSH6, NBN, PALB2, PMS2, PTEN, RAD51C, RAD51D, TP53, and XRCC2. Additionally no  variant of uncertain significance were found.   (7) RUL spiculated nodule  measuring 1.4 cm noted on staging CT scan 06/13/2015-- stable on repeat CT 07/25/15--moderately hot and slightly larger on PET 10/31/2015   (8) history of multiple hepatic adenomas  (9) fulvestrant started 10/23/2015  PLAN:  Crystal [name] is tolerating the fulvestrant well him a and I think with additional doses some of the side effects she experienced with the first dose will become milder.  She likes the idea of a "slower and lower" administration and I discussed that with our "shot nurse". We also discussed the PET scan in detail. The small spiculated right upper lobe nodule does have some uptake, and I think this is going to proved to be a slow-growing primary lung carcinoma. I am referring her to thoracic surgery for evaluation and likely resection.  She also wondered about her daughter, who recall had a history of astrocytoma as a teenager. Her daughter is now 60 and wonders when she should start having mammograms. The youngest person in the family to be diagnosed with breast cancer was 4. Accordingly I think if her daughter starts mammography at 46 that would be fine. Crystal [name] understands mammogram in younger women is really not very informative because they generally have very dense breasts. The standard recommendation is to start at age 84.  Crystal [name] is also due for a repeat bone density and I am scheduling that through [facility] sometime in the next 2 weeks.  She will return February 6 for her third dose of fulvestrant, and then her next dose will be March 6. I will see her with that dose. Hopefully by then she will have had her lung surgery and her bone density scan and we will be able to discuss those. 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  Alainna Stawicki C    11/06/2015

## 2015-11-06 NOTE — Telephone Encounter (Signed)
Appointments complete per 1/23 pof. Spoke with patient re appointments for 3/6 and per patient she is to keep scheduled appointment for 2/6. Patient to get avs report/appointments from inj nurse.

## 2015-11-08 ENCOUNTER — Telehealth: Payer: Self-pay | Admitting: *Deleted

## 2015-11-08 NOTE — Telephone Encounter (Signed)
This RN reviewed appointment and noted next faslodex appointment as 2/6- which is last " loading dose ".  Returned call to pt to discuss and obtained VM.  Message left to return call.

## 2015-11-08 NOTE — Telephone Encounter (Signed)
Patient called and left message that she was having trouble scheduling her faslodex.  The schedulers sent her to triage.  Will forward this to Dr. Virgie Dad nurse. Her call back is [phone].

## 2015-11-20 ENCOUNTER — Other Ambulatory Visit: Payer: Self-pay | Admitting: *Deleted

## 2015-11-20 ENCOUNTER — Other Ambulatory Visit: Payer: Self-pay | Admitting: Oncology

## 2015-11-20 ENCOUNTER — Institutional Professional Consult (permissible substitution) (INDEPENDENT_AMBULATORY_CARE_PROVIDER_SITE_OTHER): Payer: BLUE CROSS/BLUE SHIELD | Admitting: Cardiothoracic Surgery

## 2015-11-20 ENCOUNTER — Encounter: Payer: Self-pay | Admitting: Cardiothoracic Surgery

## 2015-11-20 ENCOUNTER — Ambulatory Visit (HOSPITAL_BASED_OUTPATIENT_CLINIC_OR_DEPARTMENT_OTHER): Payer: BLUE CROSS/BLUE SHIELD

## 2015-11-20 VITALS — BP 126/71 | HR 80 | Resp 16 | Ht 66.0 in | Wt 138.0 lb

## 2015-11-20 VITALS — BP 119/66 | HR 81 | Temp 98.2°F

## 2015-11-20 DIAGNOSIS — Z5111 Encounter for antineoplastic chemotherapy: Secondary | ICD-10-CM

## 2015-11-20 DIAGNOSIS — R918 Other nonspecific abnormal finding of lung field: Secondary | ICD-10-CM

## 2015-11-20 DIAGNOSIS — D381 Neoplasm of uncertain behavior of trachea, bronchus and lung: Secondary | ICD-10-CM | POA: Diagnosis not present

## 2015-11-20 DIAGNOSIS — C50911 Malignant neoplasm of unspecified site of right female breast: Secondary | ICD-10-CM | POA: Diagnosis not present

## 2015-11-20 DIAGNOSIS — C50811 Malignant neoplasm of overlapping sites of right female breast: Secondary | ICD-10-CM

## 2015-11-20 MED ORDER — FULVESTRANT 250 MG/5ML IM SOLN
500.0000 mg | Freq: Once | INTRAMUSCULAR | Status: AC
  Administered 2015-11-20: 500 mg via INTRAMUSCULAR
  Filled 2015-11-20: qty 10

## 2015-11-20 NOTE — Progress Notes (Addendum)
[address]       [place] [address]             [place] Record #[id number] Date of Birth: 1955-10-21  Referring: Jana Hakim Virgie Dad, MD Primary Care: Kandice Hams, MD  Chief Complaint:    Chief Complaint  Patient presents with  . Lung Lesion    RULobe...CT CHEST 07/25/15 and PET 10/31/15  . Breast Cancer    RIGHT...CHEMO COMPLETE   Cancer of overlapping sites of right female breast [facility])   Staging form: Breast, AJCC 7th Edition     Clinical: Stage IA (T1a, N0, M0) - Signed by Chauncey Cruel, MD on 06/05/2015 Recurrent cancer of right breast [facility])   Staging form: Breast, AJCC 7th Edition     Clinical: Stage IA (T1c, N0, M0) - Signed by Chauncey Cruel, MD on 06/05/2015  History of Present Illness:    Crystal [name] 60 y.o. female is seen in the office  today for evaluation of a right upper lobe lung nodule spiculated mildly hypermetabolic and the patient with known recurrent breast cancer, status post right mastectomy September 2016 . the patient has a history of hepatic adenomas and these were noted incidentally on the MRI. CT scan of the chest in August 2016 noted 1.4 cm spiculated lesion right upper lobe. She underwent treatment for her recurrent breast cancer including mastectomy and chemotherapy.  Currently on monthly fulvestrant, next dose is scheduled March 6.     Current Activity/ Functional Status:  Patient is independent with mobility/ambulation, transfers, ADL's, IADL's.   Zubrod Score: At the time of surgery this patient's most appropriate activity status/level should be described as: '[]'$     0    Normal activity, no symptoms '[x]'$     1    Restricted in physical strenuous activity but ambulatory, able to do out light work '[]'$     2    Ambulatory and capable of self care, unable to do work activities, up and about               >50 % of waking hours                              '[]'$      3    Only limited self care, in bed greater than 50% of waking hours '[]'$     4    Completely disabled, no self care, confined to bed or chair '[]'$     5    Moribund   Past Medical History  Diagnosis Date  . History of small bowel obstruction   . Liver masses   . Insomnia   . GERD (gastroesophageal reflux disease)   . Complication of anesthesia     hard to wake up  . Cancer [facility])     right breast    Past Surgical History  Procedure Laterality Date  . Liver surgery    . Laparotomies    . Hernia repair      incisional hernia repair x3  . Breast surgery Right   . Mastectomy w/ sentinel node biopsy Right 07/05/2015    Procedure: RIGHT MASTECTOMY WITH SENTINEL NODE BIOPSY AND PORT A CATH INSERTION;  Surgeon: [name], MD;  Location: [place];  Service: General;  Laterality: Right;  . Portacath placement Left 07/05/2015    Procedure: INSERTION PORT-A-CATH;  Surgeon: [name], MD;  Location: [place];  Service: General;  Laterality: Left;    Family History  Problem Relation Age of Onset  . Ovarian cancer Mother 23    "metastasized to breasts"  . Breast cancer Maternal Grandmother 48    2-3 recurrences  . Stomach cancer Maternal Grandfather     dx. 47-49; metastatic stomach cancer  . Colon polyps Father     about 2-3 polyps removed at each colonoscopy  . Colon polyps Brother     "a few"  . Other Maternal Aunt     +TAH  . Colon cancer Paternal Aunt 90  . Brain cancer Daughter 17    astrocytoma  . Breast cancer Cousin     dx. 12s    Social History   Social History  . Marital Status: Married    Spouse Name: N/A  . Number of Children: N/A  . Years of Education: N/A   Occupational History  . Not on file.   Social History Main Topics  . Smoking status: Never Smoker   . Smokeless tobacco: Never Used  . Alcohol Use: 0.6 oz/week    1 Glasses of wine per week     Comment: social  . Drug Use: No  . Sexual Activity: Not on  file   Other Topics Concern  . Not on file   Social History Narrative    History  Smoking status  . Never Smoker   Smokeless tobacco  . Never Used    History  Alcohol Use  . 0.6 oz/week  . 1 Glasses of wine per week    Comment: social     Allergies  Allergen Reactions  . Promethazine Hcl Other (See Comments)    Causes shaking  . Tylenol [Acetaminophen]     Avoids Tylenol products due to liver disease   . Penicillins Rash    Current Outpatient Prescriptions  Medication Sig Dispense Refill  . b complex vitamins tablet Take 1 tablet by mouth daily.      . calcium-vitamin D (OSCAL WITH D) 500-200 MG-UNIT per tablet Take 2 tablets by mouth daily.    . Carboxymethylcellul-Glycerin (CLEAR EYES FOR DRY EYES OP) Apply 1 drop to eye as needed.    . famotidine (PEPCID AC) 10 MG chewable tablet Chew 10 mg by mouth daily as needed for heartburn.    . fish oil-omega-3 fatty acids 1000 MG capsule Take 2 g by mouth daily.      Marland Kitchen glucosamine-chondroitin 500-400 MG tablet Take 1 tablet by mouth 3 (three) times daily.      Marland Kitchen ibuprofen (ADVIL,MOTRIN) 200 MG tablet Take 200 mg by mouth every 6 (six) hours as needed.    . lidocaine-prilocaine (EMLA) cream Apply to affected area once 30 g 3  . Multiple Vitamins-Minerals (MULTIVITAMIN WITH MINERALS) tablet Take 1 tablet by mouth daily.      . TURMERIC PO Take 500 mg by mouth daily.     No current facility-administered medications for this visit.   fulvestrant (FASLODEX) injection 500 mg   Review of Systems:     Cardiac Review of Systems: Y or N  Chest Pain [ n   ]  Resting SOB [  n ] Exertional SOB  [ y ]  Orthopnea [ n ]   Pedal Edema [n   ]    Palpitations [n ] Syncope  [ n ]  Presyncope [n   ]  General Review of Systems: [Y] = yes [  ]=no Constitional: recent weight change [  ];  Wt loss over the last 3 months [   ] anorexia [  ]; fatigue [ y ]; nausea [  ]; night sweats [  ]; fever [  ]; or chills [  ];          Dental: poor  dentition[  ]; Last Dentist visit:   Eye : blurred vision [  ]; diplopia [   ]; vision changes [  ];  Amaurosis fugax[  ]; Resp: cough [  ];  wheezing[  ];  hemoptysis[  ]; shortness of breath[  ]; paroxysmal nocturnal dyspnea[  ]; dyspnea on exertion[  ]; or orthopnea[  ];  GI:  gallstones[  ], vomiting[  ];  dysphagia[  ]; melena[  ];  hematochezia [  ]; heartburn[  ];   Hx of  Colonoscopy[  ]; GU: kidney stones [  ]; hematuria[  ];   dysuria [  ];  nocturia[  ];  history of     obstruction [  ]; urinary frequency [  ]             Skin: rash, swelling[  ];, hair loss[  ];  peripheral edema[  ];  or itching[  ]; Musculosketetal: myalgias[  ];  joint swelling[  ];  joint erythema[  ];  joint pain[  ];  back pain[  ];  Heme/Lymph: bruising[  ];  bleeding[  ];  anemia[  ];  Neuro: TIA[  ];  headaches[  ];  stroke[  ];  vertigo[  ];  seizures[  ];   paresthesias[  ];  difficulty walking[  ];  Psych:depression[  ]; anxiety[  ];  Endocrine: diabetes[n  ];  thyroid dysfunction[  ];  Immunizations: Flu up to date Blue.Reese  ]; Pneumococcal up to date [ y ];  Other:  Physical Exam: BP 126/71 mmHg  Pulse 80  Resp 16  Ht '5\' 6"'$  (1.676 m)  Wt 138 lb (62.596 kg)  BMI 22.28 kg/m2  SpO2 98%  PHYSICAL EXAMINATION: General appearance: alert, cooperative and no distress Head: Normocephalic, without obvious abnormality, atraumatic Neck: no adenopathy, no carotid bruit, no JVD, supple, symmetrical, trachea midline and thyroid not enlarged, symmetric, no tenderness/mass/nodules Lymph nodes: Cervical, supraclavicular, and axillary nodes normal. Resp: clear to auscultation bilaterally Back: symmetric, no curvature. ROM normal. No CVA tenderness. Cardio: regular rate and rhythm, S1, S2 normal, no murmur, click, rub or gallop GI: soft, non-tender; bowel sounds normal; no masses,  no organomegaly Extremities: extremities normal, atraumatic, no cyanosis or edema Neurologic: Grossly normal Right  mastectomy   Diagnostic Studies & Laboratory data:     Recent Radiology Findings:   Nm Pet Image Restag (ps) Skull Base To Thigh  10/31/2015  CLINICAL DATA:  Initial treatment strategy for spiculated 14 mm right upper lobe pulmonary nodule in a patient with personal history of breast cancer. EXAM: NUCLEAR MEDICINE PET SKULL BASE TO THIGH TECHNIQUE: 6.9 mCi F-18 FDG was injected intravenously. Full-ring PET imaging was performed from the skull base to thigh after the radiotracer. CT data was obtained and used for attenuation correction and anatomic localization. FASTING BLOOD GLUCOSE:  Value: 86 first the mg/dl COMPARISON:  None. FINDINGS: NECK No hypermetabolic lymph nodes in the neck. CHEST 1.4 cm spiculated anterior right upper lobe nodule is visualized on image 54 of series 4. This has evidence of tethering  to the anterior pleura. On PET imaging, the lesion is mildly hypermetabolic with SUV max = 2.2. No other hypermetabolic pulmonary nodule. No hypermetabolic lymphadenopathy in the mediastinum or either hilum. Left Port-A-Cath tip is positioned in the mid to distal SVC. ABDOMEN/PELVIS Surgical changes are compatible with previous right hepatectomy. The patient has numerous hypermetabolic lesions in the liver of varying sizes. MRI of 03/04/2011 documented too numerous to count fat containing lesions ranging in size from 1-7 cm compatible with adenomas. Index lesion seen on image 98 of series 4 today measures 4.9 cm and when I remeasure it on the previous MRI of 03/04/2011, this same lesion was 5.1 cm. The hepatic lesions have been present on imaging studies, by report, dating back to 10/02/1994. No abnormal hypermetabolic activity within the pancreas, adrenal glands, or spleen. No hypermetabolic lymph nodes in the abdomen or pelvis. SKELETON Marrow activity is heterogeneous and nonspecific. No scintigraphic findings to suggest bony metastatic disease on bone scan performed without 5 months ago. IMPRESSION:  1. Evidence of FDG accumulation in a small spiculated right upper lobe pulmonary nodule. While the uptake is low level, given the morphologic features of this nodule along with the discernible FDG uptake, neoplasm must be considered. This could represent primary bronchogenic neoplasm versus metastatic disease from breast cancer. 2. Innumerable heterogeneous lesions scattered throughout the liver in this patient with evidence of prior partial hepatectomy. These liver lesions have been present since 1995 and are seen to be hypermetabolic on today's PET images. Hypermetabolism is known to occur in hepatocellular adenoma. Given the volume of hypermetabolic lesions scattered throughout the liver, hypermetabolic liver metastases would be difficult to detect. Electronically Signed   By: Misty Stanley M.D.   On: 10/31/2015 10:57   CLINICAL DATA: Subsequent treatment strategy for breast carcinoma.Breast carcinoma diagnosis 2012 and 2016. Recurrent RIGHT breastcance  EXAM: CT CHEST WITH CONTRAST  TECHNIQUE: Multidetector CT imaging of the chest was performed during intravenous contrast administration.  CONTRAST: 19m OMNIPAQUE IOHEXOL 300 MG/ML SOLN  COMPARISON: CT 06/13/2015, MRI 03/04/2011  FINDINGS: Mediastinum/Nodes: Postsurgical change consistent with RIGHT mastectomy. There stranding in the RIGHT axilla related to nodal dissection. No LEFT axillary adenopathy. No supraclavicular adenopathy. No internal mammary adenopathy.  No mediastinal hilar adenopathy. No pericardial fluid.  Lungs/Pleura: Spiculated nodule in the RIGHT upper lobe measures 10 x 14 x 14 mm compared to 11 x 13 x 14 mm on prior remeasured. No interval change. No new pulmonary nodules.  Upper abdomen: Limited view of the upper abdomen demonstrates multiple lesions within the liver. These are described as benign adenomas on MRI in 2012 and are not changed from recent CT. adrenal glands are normal. There are  surgical clips along the RIGHT hepatic lobe.  Musculoskeletal: No aggressive osseous lesion.  IMPRESSION: 1. Stable spiculated nodule in the RIGHT upper lobe over 2 month interval. Recommend CT follow-up versus FDG PET scan. 2. Interval RIGHT mastectomy. 3. Stable lesions within liver compared with MRI of 2012.   Electronically Signed  By: SSuzy BouchardM.D.  On: 07/25/2015 17:25      CLINICAL DATA: Subsequent treatment strategy for breast carcinoma. Breast carcinoma diagnosis 2012 and 2016. Recurrent RIGHT breast cancer. Past  EXAM: CT CHEST WITH CONTRAST  TECHNIQUE: Multidetector CT imaging of the chest was performed during intravenous contrast administration.  CONTRAST: 760mOMNIPAQUE IOHEXOL 300 MG/ML SOLN  COMPARISON: MRI abdomen 03/04/2011  FINDINGS: Mediastinum/Nodes: No axillary or supraclavicular lymphadenopathy. No mediastinal hilar adenopathy. No central pulmonary embolism. Pericardial fluid. Esophagus is normal.  Lungs/Pleura: Spiculated nodule in the RIGHT upper lobe measures 10 mm x 12 mm on image 13 series 5. No comparison available.  Upper abdomen: Again demonstrated multiple round enhancing lesions within the LEFT and RIGHT hepatic lobe which are not changed from comparison MRI of 03/04/2011. Surgical clips along the posterior RIGHT hepatic lobe. Adrenal glands are normal.  Musculoskeletal: Aggressive osseous lesion  IMPRESSION: 1. Spiculated nodule in the RIGHT upper lobe is concerning for metastatic lesion or primary bronchogenic carcinoma. Recommend FDG PET scan further evaluation. 2. No metastatic lymphadenopathy. 3. Multiple enhancing lesions within the liver are not changed from comparison MRI of 2012. At that time these lesions were characterized as adenomas.   Electronically Signed  By: Suzy Bouchard M.D.  On: 06/13/2015 14:43    I have independently reviewed the above radiologic studies.  Recent  Lab Findings: Lab Results  Component Value Date   WBC 6.0 11/06/2015   HGB 12.9 11/06/2015   HCT 39.3 11/06/2015   PLT 199 11/06/2015   GLUCOSE 144* 11/06/2015   ALT 29 11/06/2015   AST 23 11/06/2015   NA 141 11/06/2015   K 4.0 11/06/2015   CL 102 03/15/2013   CREATININE 0.8 11/06/2015   BUN 14.4 11/06/2015   CO2 25 11/06/2015      Assessment / Plan:   PFT's Plan right VATS and wedge resection/biopsy poss lobectomy-I discussed with the patient the strategy to deal with the right upper lobe suspicious nodule. We discussed proceeding with a needle biopsy first, but in reality this will not change our approach much. Since the lesion in the right upper lobe is highly suspicious for malignancy even a negative biopsy wouldn't lead [place] to recommend resection. This lesion was found to be metastatic breast cancer and currently on scans the only site of metastatic disease we would still proceed with resection. Risks and options of surgical resection with bronchoscopy right video-assisted thoracoscopy wedge resection of right upper lobe possible lobectomy were discussed with the patient in detail and she is willing to proceed. We will obtain pulmonary function studies preoperatively. An tentatively plan for February 15.      I  spent 40 minutes counseling the patient face to face and 50% or more the  time was spent in counseling and coordination of care. The total time spent in the appointment was 60 minutes.  Grace Isaac MD      [address] [place] [address] Office [phone]   [place]  11/20/2015 8:15 PM

## 2015-11-22 ENCOUNTER — Ambulatory Visit (HOSPITAL_COMMUNITY)
Admission: RE | Admit: 2015-11-22 | Discharge: 2015-11-22 | Disposition: A | Discharge status: Home / Self Care | Payer: BLUE CROSS/BLUE SHIELD | Source: Ambulatory Visit | Attending: Cardiothoracic Surgery | Admitting: Cardiothoracic Surgery

## 2015-11-22 DIAGNOSIS — R918 Other nonspecific abnormal finding of lung field: Secondary | ICD-10-CM | POA: Insufficient documentation

## 2015-11-22 LAB — PULMONARY FUNCTION TEST
DL/VA % pred: 97 %
DL/VA: 4.79 ml/min/mmHg/L
DLCO cor % pred: 76 %
DLCO cor: 19.7 ml/min/mmHg
DLCO unc % pred: 75 %
DLCO unc: 19.4 ml/min/mmHg
FEF 25-75 Post: 1.14 L/s
FEF 25-75 Pre: 2.28 L/s
FEF2575-%Change-Post: -49 %
FEF2575-%Pred-Post: 48 %
FEF2575-%Pred-Pre: 96 %
FEV1-%Change-Post: -8 %
FEV1-%Pred-Post: 85 %
FEV1-%Pred-Pre: 93 %
FEV1-Post: 2.26 L
FEV1-Pre: 2.46 L
FEV1FVC-%Change-Post: 2 %
FEV1FVC-%Pred-Pre: 99 %
FEV6-%Change-Post: -9 %
FEV6-%Pred-Post: 86 %
FEV6-%Pred-Pre: 95 %
FEV6-Post: 2.83 L
FEV6-Pre: 3.13 L
FEV6FVC-%Change-Post: 0 %
FEV6FVC-%Pred-Post: 103 %
FEV6FVC-%Pred-Pre: 102 %
FVC-%Change-Post: -10 %
FVC-%Pred-Post: 83 %
FVC-%Pred-Pre: 93 %
FVC-Post: 2.84 L
FVC-Pre: 3.17 L
Post FEV1/FVC ratio: 79 %
Post FEV6/FVC ratio: 100 %
Pre FEV1/FVC ratio: 78 %
Pre FEV6/FVC Ratio: 99 %
RV % pred: 96 %
RV: 1.99 L
TLC % pred: 96 %
TLC: 5.03 L

## 2015-11-22 MED ORDER — ALBUTEROL SULFATE (2.5 MG/3ML) 0.083% IN NEBU
2.5000 mg | INHALATION_SOLUTION | Freq: Once | RESPIRATORY_TRACT | Status: AC
  Administered 2015-11-22: 2.5 mg via RESPIRATORY_TRACT

## 2015-11-24 ENCOUNTER — Other Ambulatory Visit: Payer: Self-pay | Admitting: Oncology

## 2015-11-27 ENCOUNTER — Ambulatory Visit (HOSPITAL_COMMUNITY)
Admission: RE | Admit: 2015-11-27 | Discharge: 2015-11-27 | Disposition: A | Discharge status: Home / Self Care | Payer: BLUE CROSS/BLUE SHIELD | Source: Ambulatory Visit | Attending: Cardiothoracic Surgery | Admitting: Cardiothoracic Surgery

## 2015-11-27 ENCOUNTER — Encounter (HOSPITAL_COMMUNITY)
Admission: RE | Admit: 2015-11-27 | Discharge: 2015-11-27 | Disposition: A | Discharge status: Home / Self Care | Payer: BLUE CROSS/BLUE SHIELD | Source: Ambulatory Visit | Attending: Cardiothoracic Surgery | Admitting: Cardiothoracic Surgery

## 2015-11-27 ENCOUNTER — Other Ambulatory Visit: Payer: Self-pay

## 2015-11-27 ENCOUNTER — Encounter (HOSPITAL_COMMUNITY): Payer: Self-pay

## 2015-11-27 VITALS — BP 126/68 | HR 83 | Temp 97.9°F | Resp 16 | Ht 66.0 in | Wt 141.4 lb

## 2015-11-27 DIAGNOSIS — Z01812 Encounter for preprocedural laboratory examination: Secondary | ICD-10-CM | POA: Diagnosis not present

## 2015-11-27 DIAGNOSIS — R918 Other nonspecific abnormal finding of lung field: Secondary | ICD-10-CM | POA: Diagnosis not present

## 2015-11-27 DIAGNOSIS — R9431 Abnormal electrocardiogram [ECG] [EKG]: Secondary | ICD-10-CM | POA: Diagnosis not present

## 2015-11-27 DIAGNOSIS — Z01818 Encounter for other preprocedural examination: Secondary | ICD-10-CM | POA: Diagnosis not present

## 2015-11-27 DIAGNOSIS — Z01810 Encounter for preprocedural cardiovascular examination: Secondary | ICD-10-CM | POA: Diagnosis not present

## 2015-11-27 HISTORY — DX: Anxiety disorder, unspecified: F41.9

## 2015-11-27 LAB — COMPREHENSIVE METABOLIC PANEL
ALT: 28 U/L (ref 14–54)
AST: 26 U/L (ref 15–41)
Albumin: 4.3 g/dL (ref 3.5–5.0)
Alkaline Phosphatase: 125 U/L (ref 38–126)
Anion gap: 11 (ref 5–15)
BUN: 12 mg/dL (ref 6–20)
CO2: 23 mmol/L (ref 22–32)
Calcium: 9.5 mg/dL (ref 8.9–10.3)
Chloride: 107 mmol/L (ref 101–111)
Creatinine, Ser: 0.69 mg/dL (ref 0.44–1.00)
GFR calc Af Amer: >60 mL/min (ref >60)
GFR calc non Af Amer: >60 mL/min (ref >60)
Glucose, Bld: 86 mg/dL (ref 65–99)
Potassium: 3.9 mmol/L (ref 3.5–5.1)
Sodium: 141 mmol/L (ref 135–145)
Total Bilirubin: 0.2 mg/dL — ABNORMAL LOW (ref 0.3–1.2)
Total Protein: 7 g/dL (ref 6.5–8.1)

## 2015-11-27 LAB — BLOOD GAS, ARTERIAL
Acid-base deficit: 2 mmol/L (ref 0.0–2.0)
Bicarbonate: 21.8 mEq/L (ref 20.0–24.0)
Drawn by: 421801
FIO2: 0.21
O2 Saturation: 98.1 %
Patient temperature: 98.6
TCO2: 22.8 mmol/L (ref 0–100)
pCO2 arterial: 33.8 mmHg — ABNORMAL LOW (ref 35.0–45.0)
pH, Arterial: 7.424 (ref 7.350–7.450)
pO2, Arterial: 111 mmHg — ABNORMAL HIGH (ref 80.0–100.0)

## 2015-11-27 LAB — CBC
HCT: 39.2 % (ref 36.0–46.0)
Hemoglobin: 12.8 g/dL (ref 12.0–15.0)
MCH: 29.8 pg (ref 26.0–34.0)
MCHC: 32.7 g/dL (ref 30.0–36.0)
MCV: 91.4 fL (ref 78.0–100.0)
Platelets: 210 10*3/uL (ref 150–400)
RBC: 4.29 MIL/uL (ref 3.87–5.11)
RDW: 13 % (ref 11.5–15.5)
WBC: 5.9 10*3/uL (ref 4.0–10.5)

## 2015-11-27 LAB — URINALYSIS, ROUTINE W REFLEX MICROSCOPIC
Bilirubin Urine: NEGATIVE
Glucose, UA: NEGATIVE mg/dL
Hgb urine dipstick: NEGATIVE
Ketones, ur: NEGATIVE mg/dL
Leukocytes, UA: NEGATIVE
Nitrite: NEGATIVE
Protein, ur: NEGATIVE mg/dL
Specific Gravity, Urine: 1.005 (ref 1.005–1.030)
pH: 5 (ref 5.0–8.0)

## 2015-11-27 LAB — APTT: aPTT: 32 seconds (ref 24–37)

## 2015-11-27 LAB — SURGICAL PCR SCREEN
MRSA, PCR: NEGATIVE
Staphylococcus aureus: NEGATIVE

## 2015-11-27 LAB — PROTIME-INR
INR: 1.06 (ref 0.00–1.49)
Prothrombin Time: 14 seconds (ref 11.6–15.2)

## 2015-11-27 NOTE — Pre-Procedure Instructions (Signed)
    Crystal [name]  11/27/2015      [id number], [place] [address] Phone: [phone] Fax: [phone]    Your procedure is scheduled on 11/29/15.  Report to [facility] Admitting at 630 A.M.  Call this number if you have problems the morning of surgery:  [phone]   Remember:  Do not eat food or drink liquids after midnight.  Take these medicines the morning of surgery with A SIP OF WATER --pepcid   Do not wear jewelry, make-up or nail polish.  Do not wear lotions, powders, or perfumes.  You may wear deodorant.  Do not shave 48 hours prior to surgery.  Men may shave face and neck.  Do not bring valuables to the hospital.  [facility] is not responsible for any belongings or valuables.  Contacts, dentures or bridgework may not be worn into surgery.  Leave your suitcase in the car.  After surgery it may be brought to your room.  For patients admitted to the hospital, discharge time will be determined by your treatment team.  Patients discharged the day of surgery will not be allowed to drive home.   Name and phone number of your driver:  Special instructions:   Please read over the following fact sheets that you were given. Pain Booklet, Coughing and Deep Breathing, Blood Transfusion Information, MRSA Information and Surgical Site Infection Prevention

## 2015-11-28 MED ORDER — VANCOMYCIN HCL IN DEXTROSE 1-5 GM/200ML-% IV SOLN
1000.0000 mg | INTRAVENOUS | Status: AC
  Administered 2015-11-29: 1000 mg via INTRAVENOUS
  Filled 2015-11-28: qty 200

## 2015-11-28 NOTE — Progress Notes (Signed)
Anesthesia chart review: Patient is a 60 year old female scheduled for video bronchoscopy, right VATS, wedge resection, possible lobectomy on 11/29/2015 by Dr. Servando Snare.  History includes recurrent breast cancer s/p right mastectomy 06/2015 and chemotherapy, left Port-a-cath, hepatic adenomas s/p surgery, GERD, "hard to wake up" after anesthesia, SBO, incisional hernia repair, non-smoker.   PCP is Dr. Seward Carol. HEM-ONC is Dr. Jana Hakim. She is not routinely followed by cardiology, but saw Dr. Daneen Schick in 04/2015 and had an ETT.  Meds include Pepcid AC, fish oil omega-3 fatty acids, Faslodex.   11/27/15 EKG: Normal sinus rhythm. Cannot rule out anterior infarct, age undetermined. No significant change since last tracing.  04/21/13 Exercise stress test: MHR achieved. No symptoms. No EKG changes suggestive of ischemia. Pending Dr. Tamala Julian. (His office note, electronically signed 04/30/13 has "Normal" written beside [facility] Stress Test [employer].)  11/27/15 CXR: IMPRESSION: No active cardiopulmonary disease. The CT noted mass in the right upper lobe is not well seen on the chest x-ray.  11/22/15 PFTs: FVC 3.17 (93%), FEV1 2.46 (93%), DLCUunc 19.40 (75%).  Preoperative labs noted.  If no acute changes then I anticipate that she can proceed as planned.  George Hugh [facility]/Anesthesiology Phone [phone] 11/28/2015 3:44 PM

## 2015-11-29 ENCOUNTER — Inpatient Hospital Stay (HOSPITAL_COMMUNITY): Payer: BLUE CROSS/BLUE SHIELD | Admitting: Certified Registered Nurse Anesthetist

## 2015-11-29 ENCOUNTER — Inpatient Hospital Stay (HOSPITAL_COMMUNITY)
Admission: RE | Admit: 2015-11-29 | Discharge: 2015-12-04 | DRG: 164 | Disposition: A | Discharge status: Home / Self Care | Payer: BLUE CROSS/BLUE SHIELD | Source: Ambulatory Visit | Attending: Cardiothoracic Surgery | Admitting: Cardiothoracic Surgery

## 2015-11-29 ENCOUNTER — Encounter (HOSPITAL_COMMUNITY): Payer: Self-pay | Admitting: Certified Registered Nurse Anesthetist

## 2015-11-29 ENCOUNTER — Inpatient Hospital Stay (HOSPITAL_COMMUNITY): Payer: BLUE CROSS/BLUE SHIELD | Admitting: Vascular Surgery

## 2015-11-29 ENCOUNTER — Encounter (HOSPITAL_COMMUNITY)
Admission: RE | Disposition: A | Discharge status: Home / Self Care | Payer: Self-pay | Source: Ambulatory Visit | Attending: Cardiothoracic Surgery

## 2015-11-29 ENCOUNTER — Inpatient Hospital Stay (HOSPITAL_COMMUNITY): Payer: BLUE CROSS/BLUE SHIELD

## 2015-11-29 DIAGNOSIS — Z9011 Acquired absence of right breast and nipple: Secondary | ICD-10-CM

## 2015-11-29 DIAGNOSIS — F419 Anxiety disorder, unspecified: Secondary | ICD-10-CM | POA: Diagnosis present

## 2015-11-29 DIAGNOSIS — C3410 Malignant neoplasm of upper lobe, unspecified bronchus or lung: Secondary | ICD-10-CM | POA: Diagnosis present

## 2015-11-29 DIAGNOSIS — K219 Gastro-esophageal reflux disease without esophagitis: Secondary | ICD-10-CM | POA: Diagnosis present

## 2015-11-29 DIAGNOSIS — E876 Hypokalemia: Secondary | ICD-10-CM | POA: Diagnosis not present

## 2015-11-29 DIAGNOSIS — R918 Other nonspecific abnormal finding of lung field: Secondary | ICD-10-CM

## 2015-11-29 DIAGNOSIS — Z4682 Encounter for fitting and adjustment of non-vascular catheter: Secondary | ICD-10-CM

## 2015-11-29 DIAGNOSIS — J9382 Other air leak: Secondary | ICD-10-CM | POA: Diagnosis not present

## 2015-11-29 DIAGNOSIS — R0602 Shortness of breath: Secondary | ICD-10-CM

## 2015-11-29 DIAGNOSIS — C3411 Malignant neoplasm of upper lobe, right bronchus or lung: Principal | ICD-10-CM | POA: Diagnosis present

## 2015-11-29 DIAGNOSIS — C50911 Malignant neoplasm of unspecified site of right female breast: Secondary | ICD-10-CM | POA: Diagnosis present

## 2015-11-29 DIAGNOSIS — Z9689 Presence of other specified functional implants: Secondary | ICD-10-CM

## 2015-11-29 DIAGNOSIS — Z853 Personal history of malignant neoplasm of breast: Secondary | ICD-10-CM

## 2015-11-29 DIAGNOSIS — R911 Solitary pulmonary nodule: Secondary | ICD-10-CM | POA: Diagnosis present

## 2015-11-29 DIAGNOSIS — Z902 Acquired absence of lung [part of]: Secondary | ICD-10-CM

## 2015-11-29 DIAGNOSIS — D62 Acute posthemorrhagic anemia: Secondary | ICD-10-CM | POA: Diagnosis not present

## 2015-11-29 HISTORY — PX: LOBECTOMY: SHX5089

## 2015-11-29 HISTORY — PX: VIDEO ASSISTED THORACOSCOPY (VATS)/WEDGE RESECTION: SHX6174

## 2015-11-29 SURGERY — VIDEO ASSISTED THORACOSCOPY (VATS)/WEDGE RESECTION
Anesthesia: General | Site: Chest | Laterality: Right

## 2015-11-29 MED ORDER — DIPHENHYDRAMINE HCL 50 MG/ML IJ SOLN: 12.5000 mg | Freq: Four times a day (QID) | INTRAMUSCULAR | Status: DC | PRN

## 2015-11-29 MED ORDER — TRAMADOL HCL 50 MG PO TABS
50.0000 mg | ORAL_TABLET | Freq: Four times a day (QID) | ORAL | Status: DC | PRN
  Administered 2015-11-30: 50 mg via ORAL
  Administered 2015-11-30 – 2015-12-01 (×3): 100 mg via ORAL
  Administered 2015-12-01 – 2015-12-04 (×6): 50 mg via ORAL
  Filled 2015-11-29 (×2): qty 2
  Filled 2015-11-29 (×2): qty 1
  Filled 2015-11-29: qty 2
  Filled 2015-11-29 (×2): qty 1
  Filled 2015-11-29: qty 2
  Filled 2015-11-29 (×2): qty 1

## 2015-11-29 MED ORDER — OXYCODONE HCL 5 MG/5ML PO SOLN: 5.0000 mg | Freq: Once | ORAL | Status: DC | PRN

## 2015-11-29 MED ORDER — FENTANYL 40 MCG/ML IV SOLN
INTRAVENOUS | Status: AC
  Filled 2015-11-29: qty 25

## 2015-11-29 MED ORDER — DEXTROSE-NACL 5-0.9 % IV SOLN
INTRAVENOUS | Status: DC
  Administered 2015-11-29: 125 mL/h via INTRAVENOUS
  Administered 2015-12-01 – 2015-12-03 (×3): via INTRAVENOUS

## 2015-11-29 MED ORDER — ROCURONIUM BROMIDE 50 MG/5ML IV SOLN
INTRAVENOUS | Status: AC
  Filled 2015-11-29: qty 1

## 2015-11-29 MED ORDER — ROCURONIUM BROMIDE 50 MG/5ML IV SOLN
INTRAVENOUS | Status: AC
  Filled 2015-11-29: qty 2

## 2015-11-29 MED ORDER — POTASSIUM CHLORIDE 10 MEQ/50ML IV SOLN
10.0000 meq | Freq: Every day | INTRAVENOUS | Status: DC | PRN
  Administered 2015-12-03 (×3): 10 meq via INTRAVENOUS
  Filled 2015-11-29 (×3): qty 50

## 2015-11-29 MED ORDER — NALOXONE HCL 0.4 MG/ML IJ SOLN: 0.4000 mg | INTRAMUSCULAR | Status: DC | PRN

## 2015-11-29 MED ORDER — MIDAZOLAM HCL 2 MG/2ML IJ SOLN
INTRAMUSCULAR | Status: AC
  Filled 2015-11-29: qty 2

## 2015-11-29 MED ORDER — PANTOPRAZOLE SODIUM 40 MG PO TBEC
40.0000 mg | DELAYED_RELEASE_TABLET | Freq: Every day | ORAL | Status: DC
  Administered 2015-11-30 – 2015-12-03 (×4): 40 mg via ORAL
  Filled 2015-11-29 (×5): qty 1

## 2015-11-29 MED ORDER — ONDANSETRON HCL 4 MG/2ML IJ SOLN
4.0000 mg | Freq: Four times a day (QID) | INTRAMUSCULAR | Status: DC | PRN
  Administered 2015-11-29: 4 mg via INTRAVENOUS
  Filled 2015-11-29: qty 2

## 2015-11-29 MED ORDER — SUGAMMADEX SODIUM 200 MG/2ML IV SOLN
INTRAVENOUS | Status: AC
  Filled 2015-11-29: qty 2

## 2015-11-29 MED ORDER — PROPOFOL 10 MG/ML IV BOLUS
INTRAVENOUS | Status: AC
  Filled 2015-11-29: qty 20

## 2015-11-29 MED ORDER — HEMOSTATIC AGENTS (NO CHARGE) OPTIME
TOPICAL | Status: DC | PRN
  Administered 2015-11-29: 1 via TOPICAL

## 2015-11-29 MED ORDER — SUGAMMADEX SODIUM 200 MG/2ML IV SOLN
INTRAVENOUS | Status: DC | PRN
  Administered 2015-11-29: 200 mg via INTRAVENOUS

## 2015-11-29 MED ORDER — SUCCINYLCHOLINE CHLORIDE 20 MG/ML IJ SOLN
INTRAMUSCULAR | Status: AC
  Filled 2015-11-29: qty 1

## 2015-11-29 MED ORDER — PROPOFOL 10 MG/ML IV BOLUS
INTRAVENOUS | Status: DC | PRN
  Administered 2015-11-29: 150 mg via INTRAVENOUS
  Administered 2015-11-29: 50 mg via INTRAVENOUS

## 2015-11-29 MED ORDER — BUPIVACAINE HCL (PF) 0.5 % IJ SOLN
INTRAMUSCULAR | Status: AC
  Filled 2015-11-29: qty 10

## 2015-11-29 MED ORDER — 0.9 % SODIUM CHLORIDE (POUR BTL) OPTIME
TOPICAL | Status: DC | PRN
  Administered 2015-11-29: 2000 mL

## 2015-11-29 MED ORDER — HYDROMORPHONE HCL 1 MG/ML IJ SOLN
INTRAMUSCULAR | Status: AC
  Filled 2015-11-29: qty 1

## 2015-11-29 MED ORDER — FENTANYL CITRATE (PF) 100 MCG/2ML IJ SOLN
INTRAMUSCULAR | Status: DC | PRN
  Administered 2015-11-29 (×7): 50 ug via INTRAVENOUS
  Administered 2015-11-29: 150 ug via INTRAVENOUS
  Administered 2015-11-29 (×5): 50 ug via INTRAVENOUS

## 2015-11-29 MED ORDER — OXYCODONE HCL 5 MG PO TABS: 5.0000 mg | ORAL_TABLET | Freq: Once | ORAL | Status: DC | PRN

## 2015-11-29 MED ORDER — ONDANSETRON HCL 4 MG/2ML IJ SOLN
4.0000 mg | Freq: Four times a day (QID) | INTRAMUSCULAR | Status: DC | PRN
  Administered 2015-11-30 (×3): 4 mg via INTRAVENOUS
  Filled 2015-11-29 (×3): qty 2

## 2015-11-29 MED ORDER — BISACODYL 5 MG PO TBEC
10.0000 mg | DELAYED_RELEASE_TABLET | Freq: Every day | ORAL | Status: DC
  Administered 2015-11-30 – 2015-12-01 (×2): 10 mg via ORAL
  Filled 2015-11-29 (×3): qty 2

## 2015-11-29 MED ORDER — LIDOCAINE HCL (CARDIAC) 20 MG/ML IV SOLN
INTRAVENOUS | Status: DC | PRN
  Administered 2015-11-29: 60 mg via INTRAVENOUS

## 2015-11-29 MED ORDER — LACTATED RINGERS IV SOLN
INTRAVENOUS | Status: DC | PRN
  Administered 2015-11-29: 08:00:00 via INTRAVENOUS

## 2015-11-29 MED ORDER — BUPIVACAINE 0.5 % ON-Q PUMP SINGLE CATH 400 ML
400.0000 mL | INJECTION | Status: DC
  Administered 2015-11-29: 400 mL
  Filled 2015-11-29: qty 400

## 2015-11-29 MED ORDER — FENTANYL CITRATE (PF) 250 MCG/5ML IJ SOLN
INTRAMUSCULAR | Status: AC
  Filled 2015-11-29: qty 5

## 2015-11-29 MED ORDER — SENNOSIDES-DOCUSATE SODIUM 8.6-50 MG PO TABS
1.0000 | ORAL_TABLET | Freq: Every day | ORAL | Status: DC
  Administered 2015-11-30: 1 via ORAL
  Filled 2015-11-29 (×2): qty 1

## 2015-11-29 MED ORDER — LACTATED RINGERS IV SOLN
INTRAVENOUS | Status: DC | PRN
  Administered 2015-11-29 (×2): via INTRAVENOUS

## 2015-11-29 MED ORDER — HYDROMORPHONE HCL 1 MG/ML IJ SOLN
0.2500 mg | INTRAMUSCULAR | Status: DC | PRN
  Administered 2015-11-29 (×3): 0.5 mg via INTRAVENOUS

## 2015-11-29 MED ORDER — OXYCODONE HCL 5 MG PO TABS: 5.0000 mg | ORAL_TABLET | ORAL | Status: DC | PRN

## 2015-11-29 MED ORDER — BUPIVACAINE HCL (PF) 0.5 % IJ SOLN
INTRAMUSCULAR | Status: DC | PRN
Start: 2015-11-29 — End: 2015-11-29
  Administered 2015-11-29: 10 mL

## 2015-11-29 MED ORDER — LIDOCAINE HCL (CARDIAC) 20 MG/ML IV SOLN
INTRAVENOUS | Status: AC
  Filled 2015-11-29: qty 5

## 2015-11-29 MED ORDER — FENTANYL 40 MCG/ML IV SOLN
INTRAVENOUS | Status: DC
  Administered 2015-11-30: 45 ug via INTRAVENOUS
  Administered 2015-11-30: 210 ug via INTRAVENOUS
  Administered 2015-11-30: 90 ug via INTRAVENOUS
  Administered 2015-12-01: 150 ug via INTRAVENOUS
  Administered 2015-12-01: 75 ug via INTRAVENOUS
  Administered 2015-12-01: 120 ug via INTRAVENOUS
  Administered 2015-12-01 – 2015-12-02 (×2): 30 ug via INTRAVENOUS
  Administered 2015-12-02: 45 ug via INTRAVENOUS
  Administered 2015-12-02: 15 ug via INTRAVENOUS
  Administered 2015-12-02: 120 ug via INTRAVENOUS
  Administered 2015-12-02: 30 ug via INTRAVENOUS
  Administered 2015-12-02: 90 ug via INTRAVENOUS
  Administered 2015-12-02: 14:00:00 via INTRAVENOUS
  Administered 2015-12-03 (×2): 75 ug via INTRAVENOUS
  Administered 2015-12-03: 30 ug via INTRAVENOUS
  Filled 2015-11-29 (×2): qty 25

## 2015-11-29 MED ORDER — PHENYLEPHRINE HCL 10 MG/ML IJ SOLN
10.0000 mg | INTRAVENOUS | Status: DC | PRN
  Administered 2015-11-29: 20 ug/min via INTRAVENOUS

## 2015-11-29 MED ORDER — MIDAZOLAM HCL 5 MG/5ML IJ SOLN
INTRAMUSCULAR | Status: DC | PRN
  Administered 2015-11-29 (×2): 1 mg via INTRAVENOUS

## 2015-11-29 MED ORDER — ONDANSETRON HCL 4 MG/2ML IJ SOLN
INTRAMUSCULAR | Status: AC
  Filled 2015-11-29: qty 2

## 2015-11-29 MED ORDER — ONDANSETRON HCL 4 MG/2ML IJ SOLN
INTRAMUSCULAR | Status: DC | PRN
  Administered 2015-11-29: 4 mg via INTRAVENOUS

## 2015-11-29 MED ORDER — DIPHENHYDRAMINE HCL 12.5 MG/5ML PO ELIX: 12.5000 mg | ORAL_SOLUTION | Freq: Four times a day (QID) | ORAL | Status: DC | PRN

## 2015-11-29 MED ORDER — CALCIUM CHLORIDE 10 % IV SOLN
INTRAVENOUS | Status: AC
  Filled 2015-11-29: qty 10

## 2015-11-29 MED ORDER — SODIUM CHLORIDE 0.9% FLUSH: 9.0000 mL | INTRAVENOUS | Status: DC | PRN

## 2015-11-29 MED ORDER — ROCURONIUM BROMIDE 100 MG/10ML IV SOLN
INTRAVENOUS | Status: DC | PRN
  Administered 2015-11-29: 10 mg via INTRAVENOUS
  Administered 2015-11-29: 50 mg via INTRAVENOUS
  Administered 2015-11-29 (×2): 20 mg via INTRAVENOUS
  Administered 2015-11-29 (×3): 10 mg via INTRAVENOUS
  Administered 2015-11-29: 20 mg via INTRAVENOUS
  Administered 2015-11-29: 5 mg via INTRAVENOUS
  Administered 2015-11-29: 20 mg via INTRAVENOUS

## 2015-11-29 SURGICAL SUPPLY — 97 items
APPLICATOR TIP COSEAL (VASCULAR PRODUCTS) IMPLANT
APPLICATOR TIP EXT COSEAL (VASCULAR PRODUCTS) ×3 IMPLANT
APPLIER CLIP ROT 10 11.4 M/L (STAPLE) ×3
BLADE SURG 11 STRL SS (BLADE) ×3 IMPLANT
BRUSH CYTOL CELLEBRITY 1.5X140 (MISCELLANEOUS) IMPLANT
CANISTER SUCTION 2500CC (MISCELLANEOUS) ×3 IMPLANT
CATH KIT ON Q 5IN SLV (PAIN MANAGEMENT) ×3 IMPLANT
CATH THORACIC 28FR (CATHETERS) ×3 IMPLANT
CATH THORACIC 36FR (CATHETERS) IMPLANT
CATH THORACIC 36FR RT ANG (CATHETERS) IMPLANT
CLIP APPLIE ROT 10 11.4 M/L (STAPLE) ×2 IMPLANT
CLIP TI MEDIUM 24 (CLIP) ×3 IMPLANT
CLIP TI MEDIUM 6 (CLIP) IMPLANT
CLIP TI WIDE RED SMALL 24 (CLIP) ×3 IMPLANT
CONN ST 1/4X3/8  BEN (MISCELLANEOUS)
CONN ST 1/4X3/8 BEN (MISCELLANEOUS) IMPLANT
CONT SPEC 4OZ CLIKSEAL STRL BL (MISCELLANEOUS) ×33 IMPLANT
COVER TABLE BACK 60X90 (DRAPES) IMPLANT
DERMABOND ADVANCED (GAUZE/BANDAGES/DRESSINGS) ×1
DERMABOND ADVANCED .7 DNX12 (GAUZE/BANDAGES/DRESSINGS) ×2 IMPLANT
DRAIN CHANNEL 28F RND 3/8 FF (WOUND CARE) ×3 IMPLANT
DRAIN CHANNEL 32F RND 10.7 FF (WOUND CARE) IMPLANT
DRAPE LAPAROSCOPIC ABDOMINAL (DRAPES) ×3 IMPLANT
DRAPE WARM FLUID 44X44 (DRAPE) IMPLANT
DRILL BIT 7/64X5 (BIT) IMPLANT
ELECT BLADE 4.0 EZ CLEAN MEGAD (MISCELLANEOUS) ×3
ELECT REM PT RETURN 9FT ADLT (ELECTROSURGICAL) ×3
ELECTRODE BLDE 4.0 EZ CLN MEGD (MISCELLANEOUS) ×2 IMPLANT
ELECTRODE REM PT RTRN 9FT ADLT (ELECTROSURGICAL) ×2 IMPLANT
FORCEPS BIOP RJ4 1.8 (CUTTING FORCEPS) IMPLANT
GAUZE SPONGE 4X4 12PLY STRL (GAUZE/BANDAGES/DRESSINGS) ×3 IMPLANT
GLOVE BIO SURGEON STRL SZ 6.5 (GLOVE) ×12 IMPLANT
GOWN STRL REUS W/ TWL LRG LVL3 (GOWN DISPOSABLE) ×8 IMPLANT
GOWN STRL REUS W/TWL LRG LVL3 (GOWN DISPOSABLE) ×4
HEMOSTAT SURGICEL 2X14 (HEMOSTASIS) ×3 IMPLANT
KIT BASIN OR (CUSTOM PROCEDURE TRAY) ×3 IMPLANT
KIT CLEAN ENDO COMPLIANCE (KITS) IMPLANT
KIT ROOM TURNOVER OR (KITS) ×3 IMPLANT
KIT SUCTION CATH 14FR (SUCTIONS) ×3 IMPLANT
MARKER SKIN DUAL TIP RULER LAB (MISCELLANEOUS) IMPLANT
NEEDLE BIOPSY TRANSBRONCH 21G (NEEDLE) IMPLANT
NS IRRIG 1000ML POUR BTL (IV SOLUTION) ×9 IMPLANT
OIL SILICONE PENTAX (PARTS (SERVICE/REPAIRS)) ×3 IMPLANT
PACK CHEST (CUSTOM PROCEDURE TRAY) ×3 IMPLANT
PAD ARMBOARD 7.5X6 YLW CONV (MISCELLANEOUS) IMPLANT
PASSER SUT SWANSON 36MM LOOP (INSTRUMENTS) ×3 IMPLANT
POUCH ENDO CATCH II 15MM (MISCELLANEOUS) ×3 IMPLANT
POUCH SPECIMEN RETRIEVAL 10MM (ENDOMECHANICALS) ×3 IMPLANT
RELOAD GOLD ECHELON 45 (STAPLE) ×30 IMPLANT
RELOAD GREEN ECHELON 45 (STAPLE) ×3 IMPLANT
SCISSORS LAP 5X35 DISP (ENDOMECHANICALS) IMPLANT
SEALANT PROGEL (MISCELLANEOUS) IMPLANT
SEALANT SURG COSEAL 4ML (VASCULAR PRODUCTS) IMPLANT
SEALANT SURG COSEAL 8ML (VASCULAR PRODUCTS) ×3 IMPLANT
SOLUTION ANTI FOG 6CC (MISCELLANEOUS) ×3 IMPLANT
SPONGE GAUZE 4X4 12PLY STER LF (GAUZE/BANDAGES/DRESSINGS) ×3 IMPLANT
SPONGE INTESTINAL PEANUT (DISPOSABLE) ×3 IMPLANT
SPONGE TONSIL 1 RF SGL (DISPOSABLE) ×3 IMPLANT
STAPLE RELOAD 2.5MM WHITE (STAPLE) ×6 IMPLANT
STAPLER ECHELON POWERED (MISCELLANEOUS) ×3 IMPLANT
STAPLER VASCULAR ECHELON 35 (CUTTER) ×3 IMPLANT
SUT PROLENE 3 0 SH DA (SUTURE) IMPLANT
SUT PROLENE 4 0 RB 1 (SUTURE) ×1
SUT PROLENE 4-0 RB1 .5 CRCL 36 (SUTURE) ×2 IMPLANT
SUT SILK  1 MH (SUTURE) ×4
SUT SILK 1 MH (SUTURE) ×8 IMPLANT
SUT SILK 1 TIES 10X30 (SUTURE) IMPLANT
SUT SILK 2 0 SH (SUTURE) IMPLANT
SUT SILK 2 0 SH CR/8 (SUTURE) ×3 IMPLANT
SUT SILK 2 0SH CR/8 30 (SUTURE) IMPLANT
SUT SILK 3 0SH CR/8 30 (SUTURE) IMPLANT
SUT STEEL 1 (SUTURE) IMPLANT
SUT VIC AB 1 CTX 18 (SUTURE) ×3 IMPLANT
SUT VIC AB 1 CTX 36 (SUTURE) ×2
SUT VIC AB 1 CTX36XBRD ANBCTR (SUTURE) ×4 IMPLANT
SUT VIC AB 2-0 CTX 36 (SUTURE) ×6 IMPLANT
SUT VIC AB 2-0 UR6 27 (SUTURE) IMPLANT
SUT VIC AB 3-0 SH 8-18 (SUTURE) IMPLANT
SUT VIC AB 3-0 X1 27 (SUTURE) ×9 IMPLANT
SUT VICRYL 0 UR6 27IN ABS (SUTURE) IMPLANT
SUT VICRYL 2 TP 1 (SUTURE) ×3 IMPLANT
SWAB COLLECTION DEVICE MRSA (MISCELLANEOUS) IMPLANT
SYR 20ML ECCENTRIC (SYRINGE) IMPLANT
SYSTEM SAHARA CHEST DRAIN ATS (WOUND CARE) ×3 IMPLANT
TAPE CLOTH SURG 4X10 WHT LF (GAUZE/BANDAGES/DRESSINGS) ×3 IMPLANT
TAPE UMBILICAL COTTON 1/8X30 (MISCELLANEOUS) ×9 IMPLANT
TIP APPLICATOR SPRAY EXTEND 16 (VASCULAR PRODUCTS) IMPLANT
TOWEL OR 17X24 6PK STRL BLUE (TOWEL DISPOSABLE) IMPLANT
TOWEL OR 17X26 10 PK STRL BLUE (TOWEL DISPOSABLE) ×6 IMPLANT
TRAP SPECIMEN MUCOUS 40CC (MISCELLANEOUS) IMPLANT
TRAY FOLEY CATH 16FRSI W/METER (SET/KITS/TRAYS/PACK) ×3 IMPLANT
TROCAR XCEL BLUNT TIP 100MML (ENDOMECHANICALS) ×6 IMPLANT
TUBE ANAEROBIC SPECIMEN COL (MISCELLANEOUS) IMPLANT
TUBE CONNECTING 12X1/4 (SUCTIONS) ×3 IMPLANT
TUBE CONNECTING 20X1/4 (TUBING) IMPLANT
TUNNELER SHEATH ON-Q 11GX8 DSP (PAIN MANAGEMENT) ×3 IMPLANT
WATER STERILE IRR 1000ML POUR (IV SOLUTION) IMPLANT

## 2015-11-29 NOTE — Anesthesia Preprocedure Evaluation (Addendum)
Anesthesia Evaluation  Patient identified by MRN, date of birth, ID band Patient awake    Reviewed: Allergy & Precautions, NPO status , Patient's Chart, lab work & pertinent test results  History of Anesthesia Complications Negative for: history of anesthetic complications  Airway Mallampati: II  TM Distance: >3 FB Neck ROM: Full    Dental  (+) Dental Advisory Given, Teeth Intact   Pulmonary neg pulmonary ROS,    breath sounds clear to auscultation       Cardiovascular negative cardio ROS   Rhythm:Regular     Neuro/Psych PSYCHIATRIC DISORDERS Anxiety negative neurological ROS     GI/Hepatic Neg liver ROS, GERD  Medicated,  Endo/Other  negative endocrine ROS  Renal/GU negative Renal ROS     Musculoskeletal   Abdominal   Peds  Hematology negative hematology ROS (+)   Anesthesia Other Findings   Reproductive/Obstetrics                           Anesthesia Physical Anesthesia Plan  ASA: II  Anesthesia Plan: General   Post-op Pain Management:    Induction: Intravenous  Airway Management Planned: Oral ETT and Double Lumen EBT  Additional Equipment: Arterial line, CVP and Ultrasound Guidance Line Placement  Intra-op Plan:   Post-operative Plan: Extubation in OR and Possible Post-op intubation/ventilation  Informed Consent: I have reviewed the patients History and Physical, chart, labs and discussed the procedure including the risks, benefits and alternatives for the proposed anesthesia with the patient or authorized representative who has indicated his/her understanding and acceptance.   Dental advisory given  Plan Discussed with: CRNA, Anesthesiologist and Surgeon  Anesthesia Plan Comments:         Anesthesia Quick Evaluation

## 2015-11-29 NOTE — H&P (Signed)
[address]       [place], [address]             Gregory Record #[id number] Date of Birth: [birth year]  Referring: No ref. provider found Primary Care: Kandice Hams, MD  Chief Complaint:    No chief complaint on file.  Cancer of overlapping sites of right female breast [facility])   Staging form: Breast, AJCC 7th Edition     Clinical: Stage IA (T1a, N0, M0) - Signed by Chauncey Cruel, MD on 06/05/2015 Recurrent cancer of right breast [facility])   Staging form: Breast, AJCC 7th Edition     Clinical: Stage IA (T1c, N0, M0) - Signed by Chauncey Cruel, MD on 06/05/2015  History of Present Illness:    RYELEIGH SANTORE 60 y.o. female is seen in the office  today for evaluation of a right upper lobe lung nodule spiculated mildly hypermetabolic and the patient with known recurrent breast cancer, status post right mastectomy September 2016 . the patient has a history of hepatic adenomas and these were noted incidentally on the MRI. CT scan of the chest in August 2016 noted 1.4 cm spiculated lesion right upper lobe. She underwent treatment for her recurrent breast cancer including mastectomy and chemotherapy.  Currently on monthly fulvestrant, next dose is scheduled March 6.     Current Activity/ Functional Status:  Patient is independent with mobility/ambulation, transfers, ADL's, IADL's.   Zubrod Score: At the time of surgery this patient's most appropriate activity status/level should be described as: '[]'$     0    Normal activity, no symptoms '[x]'$     1    Restricted in physical strenuous activity but ambulatory, able to do out light work '[]'$     2    Ambulatory and capable of self care, unable to do work activities, up and about               >50 % of waking hours                              '[]'$     3    Only limited self care, in bed greater than 50% of waking hours '[]'$     4    Completely disabled, no self care,  confined to bed or chair '[]'$     5    Moribund   Past Medical History  Diagnosis Date  . History of small bowel obstruction   . Liver masses   . Insomnia   . GERD (gastroesophageal reflux disease)   . Complication of anesthesia     hard to wake up  . Cancer [facility])     right breast  . Anxiety     Past Surgical History  Procedure Laterality Date  . Liver surgery    . Laparotomies    . Hernia repair      incisional hernia repair x3  . Breast surgery Right   . Mastectomy w/ sentinel node biopsy Right 07/05/2015    Procedure: RIGHT MASTECTOMY WITH SENTINEL NODE BIOPSY AND PORT A CATH INSERTION;  Surgeon: [name], MD;  Location: [place];  Service: General;  Laterality: Right;  . Portacath placement Left 07/05/2015    Procedure: INSERTION PORT-A-CATH;  Surgeon: [name], MD;  Location: [place];  Service: General;  Laterality: Left;    Family History  Problem Relation Age of Onset  . Ovarian cancer Mother 58    "metastasized to breasts"  . Breast cancer Maternal Grandmother 48    2-3 recurrences  . Stomach cancer Maternal Grandfather     dx. 47-49; metastatic stomach cancer  . Colon polyps Father     about 2-3 polyps removed at each colonoscopy  . Colon polyps Brother     "a few"  . Other Maternal Aunt     +TAH  . Colon cancer Paternal Aunt 90  . Brain cancer Daughter 17    astrocytoma  . Breast cancer Cousin     dx. 72s    Social History   Social History  . Marital Status: Married    Spouse Name: N/A  . Number of Children: N/A  . Years of Education: N/A   Occupational History  . Not on file.   Social History Main Topics  . Smoking status: Never Smoker   . Smokeless tobacco: Never Used  . Alcohol Use: 0.6 oz/week    1 Glasses of wine per week     Comment: social  . Drug Use: No  . Sexual Activity: Not on file   Other Topics Concern  . Not on file   Social History Narrative    History  Smoking status    . Never Smoker   Smokeless tobacco  . Never Used    History  Alcohol Use  . 0.6 oz/week  . 1 Glasses of wine per week    Comment: social     Allergies  Allergen Reactions  . Promethazine Hcl Other (See Comments)    Causes shaking  . Tylenol [Acetaminophen] Other (See Comments)    Avoids Tylenol products due to liver disease   . Penicillins Rash    Current Facility-Administered Medications  Medication Dose Route Frequency Provider Last Rate Last Dose  . vancomycin (VANCOCIN) IVPB 1000 mg/200 mL premix  1,000 mg Intravenous To SS-Surg Grace Isaac, MD       fulvestrant (FASLODEX) injection 500 mg   Review of Systems:     Cardiac Review of Systems: Y or N  Chest Pain [ n   ]  Resting SOB [  n ] Exertional SOB  [ y ]  Orthopnea [ n ]   Pedal Edema [n   ]    Palpitations [n ] Syncope  [ n ]   Presyncope [n   ]  General Review of Systems: [Y] = yes [  ]=no Constitional: recent weight change [  ];  Wt loss over the last 3 months [   ] anorexia [  ]; fatigue [ y ]; nausea [  ]; night sweats [  ]; fever [  ]; or chills [  ];          Dental: poor dentition[  ]; Last Dentist visit:   Eye : blurred vision [  ]; diplopia [   ]; vision changes [  ];  Amaurosis fugax[  ]; Resp: cough [  ];  wheezing[  ];  hemoptysis[  ]; shortness of breath[  ]; paroxysmal nocturnal dyspnea[  ]; dyspnea on exertion[  ]; or orthopnea[  ];  GI:  gallstones[  ], vomiting[  ];  dysphagia[  ]; melena[  ];  hematochezia [  ]; heartburn[  ];   Hx of  Colonoscopy[  ]; GU: kidney stones [  ];  hematuria[  ];   dysuria [  ];  nocturia[  ];  history of     obstruction [  ]; urinary frequency [  ]             Skin: rash, swelling[  ];, hair loss[  ];  peripheral edema[  ];  or itching[  ]; Musculosketetal: myalgias[  ];  joint swelling[  ];  joint erythema[  ];  joint pain[  ];  back pain[  ];  Heme/Lymph: bruising[  ];  bleeding[  ];  anemia[  ];  Neuro: TIA[  ];  headaches[  ];  stroke[  ];  vertigo[  ];   seizures[  ];   paresthesias[  ];  difficulty walking[  ];  Psych:depression[  ]; anxiety[  ];  Endocrine: diabetes[n  ];  thyroid dysfunction[  ];  Immunizations: Flu up to date Blue.Reese  ]; Pneumococcal up to date [ y ];  Other:  Physical Exam: BP 134/66 mmHg  Pulse 78  Temp(Src) 98.2 F (36.8 C) (Oral)  Resp 18  Ht '5\' 6"'$  (1.676 m)  Wt 141 lb 6.4 oz (64.139 kg)  BMI 22.83 kg/m2  SpO2 100%  PHYSICAL EXAMINATION: General appearance: alert, cooperative and no distress Head: Normocephalic, without obvious abnormality, atraumatic Neck: no adenopathy, no carotid bruit, no JVD, supple, symmetrical, trachea midline and thyroid not enlarged, symmetric, no tenderness/mass/nodules Lymph nodes: Cervical, supraclavicular, and axillary nodes normal. Resp: clear to auscultation bilaterally Back: symmetric, no curvature. ROM normal. No CVA tenderness. Cardio: regular rate and rhythm, S1, S2 normal, no murmur, click, rub or gallop GI: soft, non-tender; bowel sounds normal; no masses,  no organomegaly Extremities: extremities normal, atraumatic, no cyanosis or edema Neurologic: Grossly normal Right mastectomy   Diagnostic Studies & Laboratory data:     Recent Radiology Findings:   Nm Pet Image Restag (ps) Skull Base To Thigh  10/31/2015  CLINICAL DATA:  Initial treatment strategy for spiculated 14 mm right upper lobe pulmonary nodule in a patient with personal history of breast cancer. EXAM: NUCLEAR MEDICINE PET SKULL BASE TO THIGH TECHNIQUE: 6.9 mCi F-18 FDG was injected intravenously. Full-ring PET imaging was performed from the skull base to thigh after the radiotracer. CT data was obtained and used for attenuation correction and anatomic localization. FASTING BLOOD GLUCOSE:  Value: 86 first the mg/dl COMPARISON:  None. FINDINGS: NECK No hypermetabolic lymph nodes in the neck. CHEST 1.4 cm spiculated anterior right upper lobe nodule is visualized on image 54 of series 4. This has evidence of  tethering to the anterior pleura. On PET imaging, the lesion is mildly hypermetabolic with SUV max = 2.2. No other hypermetabolic pulmonary nodule. No hypermetabolic lymphadenopathy in the mediastinum or either hilum. Left Port-A-Cath tip is positioned in the mid to distal SVC. ABDOMEN/PELVIS Surgical changes are compatible with previous right hepatectomy. The patient has numerous hypermetabolic lesions in the liver of varying sizes. MRI of 03/04/2011 documented too numerous to count fat containing lesions ranging in size from 1-7 cm compatible with adenomas. Index lesion seen on image 98 of series 4 today measures 4.9 cm and when I remeasure it on the previous MRI of 03/04/2011, this same lesion was 5.1 cm. The hepatic lesions have been present on imaging studies, by report, dating back to 10/02/1994. No abnormal hypermetabolic activity within the pancreas, adrenal glands, or spleen. No hypermetabolic lymph nodes in the abdomen or pelvis. SKELETON Marrow activity is heterogeneous and nonspecific. No scintigraphic findings to suggest bony metastatic disease  on bone scan performed without 5 months ago. IMPRESSION: 1. Evidence of FDG accumulation in a small spiculated right upper lobe pulmonary nodule. While the uptake is low level, given the morphologic features of this nodule along with the discernible FDG uptake, neoplasm must be considered. This could represent primary bronchogenic neoplasm versus metastatic disease from breast cancer. 2. Innumerable heterogeneous lesions scattered throughout the liver in this patient with evidence of prior partial hepatectomy. These liver lesions have been present since 1995 and are seen to be hypermetabolic on today's PET images. Hypermetabolism is known to occur in hepatocellular adenoma. Given the volume of hypermetabolic lesions scattered throughout the liver, hypermetabolic liver metastases would be difficult to detect. Electronically Signed   By: Misty Stanley M.D.   On:  10/31/2015 10:57   CLINICAL DATA: Subsequent treatment strategy for breast carcinoma.Breast carcinoma diagnosis 2012 and 2016. Recurrent RIGHT breastcance  EXAM: CT CHEST WITH CONTRAST  TECHNIQUE: Multidetector CT imaging of the chest was performed during intravenous contrast administration.  CONTRAST: 74m OMNIPAQUE IOHEXOL 300 MG/ML SOLN  COMPARISON: CT 06/13/2015, MRI 03/04/2011  FINDINGS: Mediastinum/Nodes: Postsurgical change consistent with RIGHT mastectomy. There stranding in the RIGHT axilla related to nodal dissection. No LEFT axillary adenopathy. No supraclavicular adenopathy. No internal mammary adenopathy.  No mediastinal hilar adenopathy. No pericardial fluid.  Lungs/Pleura: Spiculated nodule in the RIGHT upper lobe measures 10 x 14 x 14 mm compared to 11 x 13 x 14 mm on prior remeasured. No interval change. No new pulmonary nodules.  Upper abdomen: Limited view of the upper abdomen demonstrates multiple lesions within the liver. These are described as benign adenomas on MRI in 2012 and are not changed from recent CT. adrenal glands are normal. There are surgical clips along the RIGHT hepatic lobe.  Musculoskeletal: No aggressive osseous lesion.  IMPRESSION: 1. Stable spiculated nodule in the RIGHT upper lobe over 2 month interval. Recommend CT follow-up versus FDG PET scan. 2. Interval RIGHT mastectomy. 3. Stable lesions within liver compared with MRI of 2012.   Electronically Signed  By: SSuzy BouchardM.D.  On: 07/25/2015 17:25      CLINICAL DATA: Subsequent treatment strategy for breast carcinoma. Breast carcinoma diagnosis 2012 and 2016. Recurrent RIGHT breast cancer. Past  EXAM: CT CHEST WITH CONTRAST  TECHNIQUE: Multidetector CT imaging of the chest was performed during intravenous contrast administration.  CONTRAST: 758mOMNIPAQUE IOHEXOL 300 MG/ML SOLN  COMPARISON: MRI abdomen  03/04/2011  FINDINGS: Mediastinum/Nodes: No axillary or supraclavicular lymphadenopathy. No mediastinal hilar adenopathy. No central pulmonary embolism. Pericardial fluid. Esophagus is normal.  Lungs/Pleura: Spiculated nodule in the RIGHT upper lobe measures 10 mm x 12 mm on image 13 series 5. No comparison available.  Upper abdomen: Again demonstrated multiple round enhancing lesions within the LEFT and RIGHT hepatic lobe which are not changed from comparison MRI of 03/04/2011. Surgical clips along the posterior RIGHT hepatic lobe. Adrenal glands are normal.  Musculoskeletal: Aggressive osseous lesion  IMPRESSION: 1. Spiculated nodule in the RIGHT upper lobe is concerning for metastatic lesion or primary bronchogenic carcinoma. Recommend FDG PET scan further evaluation. 2. No metastatic lymphadenopathy. 3. Multiple enhancing lesions within the liver are not changed from comparison MRI of 2012. At that time these lesions were characterized as adenomas.   Electronically Signed  By: StSuzy Bouchard.D.  On: 06/13/2015 14:43    I have independently reviewed the above radiologic studies.  Recent Lab Findings: Lab Results  Component Value Date   WBC 5.9 11/27/2015   HGB 12.8 11/27/2015  HCT 39.2 11/27/2015   PLT 210 11/27/2015   GLUCOSE 86 11/27/2015   ALT 28 11/27/2015   AST 26 11/27/2015   NA 141 11/27/2015   K 3.9 11/27/2015   CL 107 11/27/2015   CREATININE 0.69 11/27/2015   BUN 12 11/27/2015   CO2 23 11/27/2015   INR 1.06 11/27/2015   PFT's: FEV1 2.4 93% DLCO 19.4 75%   Assessment / Plan:    Plan right VATS and wedge resection/biopsy poss lobectomy-I discussed with the patient the strategy to deal with the right upper lobe suspicious nodule. We discussed proceeding with a needle biopsy first, but in reality this will not change our approach much. Since the lesion in the right upper lobe is highly suspicious for malignancy even a negative biopsy  wouldn't lead [place] to recommend resection. This lesion was found to be metastatic breast cancer and currently on scans the only site of metastatic disease we would still proceed with resection. Risks and options of surgical resection with bronchoscopy right video-assisted thoracoscopy wedge resection of right upper lobe possible lobectomy were discussed with the patient in detail and she is willing to proceed.   The goals risks and alternatives of the planned surgical procedure bronchoscopy right video-assisted thoracoscopy wedge resection of right upper lobe possible lobectomy  have been discussed with the patient in detail. The risks of the procedure including death, infection, stroke, myocardial infarction, bleeding, blood transfusion have all been discussed specifically.  I have quoted [name] a 2 % of perioperative mortality and a complication rate as high as 20 %. The patient's questions have been answered.TROYCE FEBO is willing  to proceed with the planned procedure. Grace Isaac MD      [address] [place] [address] Office [phone]   Beeper [phone]  11/29/2015 7:38 AM

## 2015-11-29 NOTE — Transfer of Care (Signed)
Immediate Anesthesia Transfer of Care Note  Patient: Crystal [name]  Procedure(s) Performed: Procedure(s): BRONCHOSCOPY , VIDEO ASSISTED THORACOSCOPY (VATS)/WEDGE RESECTION (Right) Completion Right Upper  LOBECTOMY, Lymph node dissection, on Q placement (Right)  Patient Location: PACU  Anesthesia Type:General  Level of Consciousness: awake, alert  and oriented  Airway & Oxygen Therapy: Patient Spontanous Breathing and Patient connected to nasal cannula oxygen  Post-op Assessment: Report given to RN, Post -op Vital signs reviewed and stable and Patient moving all extremities X 4  Post vital signs: Reviewed and stable  Last Vitals:  Filed Vitals:   11/29/15 0650 11/29/15 0708  BP: 141/58 134/66  Pulse: 90 78  Temp: 36.4 C 36.8 C  Resp: 16 18    Complications: No apparent anesthesia complications

## 2015-11-29 NOTE — OR Nursing (Signed)
11:03 - called volunteer desk to update family per Dr. Servando Snare

## 2015-11-29 NOTE — Brief Op Note (Addendum)
      [address]       [place] [address]             [phone]      11/29/2015  2:28 PM  PATIENT:  [name]  60 y.o. female  PRE-OPERATIVE DIAGNOSIS:  LUNG MASS  POST-OPERATIVE DIAGNOSIS:  LUNG MASS/non small lung cancer favored on frozen  PROCEDURE:  Procedure(s): VIDEO ASSISTED THORACOSCOPY (VATS)/WEDGE RESECTION (Right) Right Upper  LOBECTOMY (Right) FLEXIBLE BRONCHOSCOPY Node dissection On Q  Placement  SURGEON:  Surgeon(s) and Role:    * Grace Isaac, MD - Primary  PHYSICIAN ASSISTANT: WAYNE GOLD PA-C  ANESTHESIA:   general  EBL:  Total I/O In: 2000 [I.V.:2000] Out: 1625 [Urine:1225; Blood:400]  BLOOD ADMINISTERED:none  DRAINS: 1 75F Chest Tube(s) in the RIGHT HEMITHORAX and (1 28 F) Blake drain(s) in the RIGHT HEMITHORAX   LOCAL MEDICATIONS USED:  NONE  SPECIMEN:  Source of Specimen:  RUL WEDGE/COPLETION LOBECTOMY AND LND  DISPOSITION OF SPECIMEN:  PATHOLOGY  COUNTS:  YES   DICTATION: .Other Dictation: Dictation Number PENDING  PLAN OF CARE: Admit to inpatient   PATIENT DISPOSITION:  PACU - hemodynamically stable.   Delay start of Pharmacological VTE agent (>24hrs) due to surgical blood loss or risk of bleeding: yes

## 2015-11-29 NOTE — Anesthesia Procedure Notes (Addendum)
Central Venous Catheter Insertion Performed by: anesthesiologist 11/29/2015 8:09 AM Patient location: Pre-op. Preanesthetic checklist: patient identified, IV checked, site marked, risks and benefits discussed, surgical consent, monitors and equipment checked, pre-op evaluation, timeout performed and anesthesia consent Lidocaine 1% used for infiltration Landmarks identified Catheter size: 8 Fr Central line was placed.Double lumen Procedure performed using ultrasound guided technique. Attempts: 1 Following insertion, dressing applied and line sutured. Post procedure assessment: blood return through all ports. Patient tolerated the procedure well with no immediate complications.   Procedure Name: Intubation Date/Time: 11/29/2015 9:00 AM Performed by: Garrison Columbus T Pre-anesthesia Checklist: Patient identified, Emergency Drugs available, Suction available and Patient being monitored Patient Re-evaluated:Patient Re-evaluated prior to inductionOxygen Delivery Method: Circle system utilized Preoxygenation: Pre-oxygenation with 100% oxygen Intubation Type: IV induction Ventilation: Mask ventilation without difficulty Laryngoscope Size: Miller and 2 Grade View: Grade I Endobronchial tube: Left, Double lumen EBT, EBT position confirmed by fiberoptic bronchoscope and EBT position confirmed by auscultation and 37 Fr Number of attempts: 1 Airway Equipment and Method: Stylet Placement Confirmation: ETT inserted through vocal cords under direct vision,  positive ETCO2 and breath sounds checked- equal and bilateral Tube secured with: Tape Dental Injury: Teeth and Oropharynx as per pre-operative assessment

## 2015-11-29 NOTE — Progress Notes (Signed)
Patient ID: Crystal [name], female   DOB: [birth year], 59 y.o.   MRN: [id number] EVENING ROUNDS NOTE :     [address]       [place] [address]             [phone]                 Day of Surgery Procedure(s) (LRB): BRONCHOSCOPY , VIDEO ASSISTED THORACOSCOPY (VATS)/WEDGE RESECTION (Right) Completion Right Upper  LOBECTOMY, Lymph node dissection, on Q placement (Right)  Total Length of Stay:  LOS: 0 days  BP 105/60 mmHg  Pulse 69  Temp(Src) 98.9 F (37.2 C) (Oral)  Resp 20  Ht '5\' 6"'$  (1.676 m)  Wt 141 lb 6.4 oz (64.139 kg)  BMI 22.83 kg/m2  SpO2 97%  .Intake/Output      02/14 0701 - 02/15 0700 02/15 0701 - 02/16 0700   I.V. (mL/kg)  2000 (31.2)   Total Intake(mL/kg)  2000 (31.2)   Urine (mL/kg/hr)  1425 (2)   Blood  400 (0.6)   Chest Tube  90 (0.1)   Total Output   1915   Net   +85          . dextrose 5 % and 0.9% NaCl     Dg Chest Port 1 View  11/29/2015  CLINICAL DATA:  Postop right upper lobectomy. EXAM: PORTABLE CHEST 1 VIEW COMPARISON:  Radiographs 11/27/2015.  CT 10/31/2015. FINDINGS: 1542 hours. Two right-sided chest tubes extend to the right lung apex. There is no significant pneumothorax. There is a right IJ central venous catheter extending to upper right atrium. Pre-existing left subclavian Port-A-Cath is unchanged near the SVC right atrial junction. The heart size and mediastinal contours are stable. There is mild atelectasis both lung bases. Postsurgical changes are present within the upper abdomen. IMPRESSION: No demonstrated complication following right upper lobe resection. Electronically Signed   By: Richardean Sale M.D.   On: 11/29/2015 15:51    Lab Results  Component Value Date   WBC 5.9 11/27/2015   HGB 12.8 11/27/2015   HCT 39.2 11/27/2015   PLT 210 11/27/2015   GLUCOSE 86 11/27/2015   ALT 28 11/27/2015   AST 26 11/27/2015   NA 141 11/27/2015   K 3.9 11/27/2015   CL 107 11/27/2015   CREATININE 0.69 11/27/2015   BUN 12  11/27/2015   CO2 23 11/27/2015   INR 1.06 11/27/2015   Stable in 2s, some nausea   Grace Isaac MD  Beeper [phone] Office [phone] 11/29/2015 6:03 PM

## 2015-11-30 ENCOUNTER — Encounter (HOSPITAL_COMMUNITY): Payer: Self-pay | Admitting: Cardiothoracic Surgery

## 2015-11-30 ENCOUNTER — Inpatient Hospital Stay (HOSPITAL_COMMUNITY): Payer: BLUE CROSS/BLUE SHIELD

## 2015-11-30 DIAGNOSIS — C3410 Malignant neoplasm of upper lobe, unspecified bronchus or lung: Secondary | ICD-10-CM | POA: Diagnosis present

## 2015-11-30 LAB — POCT I-STAT 3, ART BLOOD GAS (G3+)
Acid-base deficit: 1 mmol/L (ref 0.0–2.0)
Bicarbonate: 24.4 mEq/L — ABNORMAL HIGH (ref 20.0–24.0)
O2 Saturation: 95 %
Patient temperature: 98.6
TCO2: 26 mmol/L (ref 0–100)
pCO2 arterial: 40.7 mmHg (ref 35.0–45.0)
pH, Arterial: 7.386 (ref 7.350–7.450)
pO2, Arterial: 79 mmHg — ABNORMAL LOW (ref 80.0–100.0)

## 2015-11-30 LAB — CBC
HCT: 33.5 % — ABNORMAL LOW (ref 36.0–46.0)
Hemoglobin: 10.3 g/dL — ABNORMAL LOW (ref 12.0–15.0)
MCH: 28.3 pg (ref 26.0–34.0)
MCHC: 30.7 g/dL (ref 30.0–36.0)
MCV: 92 fL (ref 78.0–100.0)
PLATELETS: 209 10*3/uL (ref 150–400)
RBC: 3.64 MIL/uL — AB (ref 3.87–5.11)
RDW: 13.1 % (ref 11.5–15.5)
WBC: 8.2 10*3/uL (ref 4.0–10.5)

## 2015-11-30 LAB — BASIC METABOLIC PANEL
ANION GAP: 9 (ref 5–15)
BUN: 8 mg/dL (ref 6–20)
CALCIUM: 8.7 mg/dL — AB (ref 8.9–10.3)
CO2: 26 mmol/L (ref 22–32)
Chloride: 108 mmol/L (ref 101–111)
Creatinine, Ser: 0.81 mg/dL (ref 0.44–1.00)
GLUCOSE: 198 mg/dL — AB (ref 65–99)
POTASSIUM: 4.1 mmol/L (ref 3.5–5.1)
SODIUM: 143 mmol/L (ref 135–145)

## 2015-11-30 MED ORDER — ENOXAPARIN SODIUM 30 MG/0.3ML ~~LOC~~ SOLN
30.0000 mg | SUBCUTANEOUS | Status: DC
  Administered 2015-11-30 – 2015-12-04 (×5): 30 mg via SUBCUTANEOUS
  Filled 2015-11-30 (×5): qty 0.3

## 2015-11-30 NOTE — Progress Notes (Signed)
Patient ID: MALEAHA HUGHETT, female   DOB: 1956-02-12, 60 y.o.   MRN: [id number] EVENING ROUNDS NOTE :     [address]       [place] [address]             [phone]                 1 Day Post-Op Procedure(s) (LRB): BRONCHOSCOPY , VIDEO ASSISTED THORACOSCOPY (VATS)/WEDGE RESECTION (Right) Completion Right Upper  LOBECTOMY, Lymph node dissection, on Q placement (Right)  Total Length of Stay:  LOS: 1 day  BP 145/79 mmHg  Pulse 74  Temp(Src) 97.8 F (36.6 C) (Oral)  Resp 21  Ht '5\' 6"'$  (1.676 m)  Wt 141 lb 6.4 oz (64.139 kg)  BMI 22.83 kg/m2  SpO2 100%  .Intake/Output      02/15 0701 - 02/16 0700 02/16 0701 - 02/17 0700   P[address]  [address]   I.V. (mL/kg) 3625 (56.5) 621.3 (9.7)   Total Intake(mL/kg) 3625 (56.5) 1101.3 (17.2)   Urine (mL/kg/hr) 2095 (1.4) 160 (0.2)   Blood 400 (0.3)    Chest Tube 350 (0.2) 70 (0.1)   Total Output 2845 230   Net +780 +871.3          . dextrose 5 % and 0.9% NaCl 50 mL/hr at 11/30/15 1700     Lab Results  Component Value Date   WBC 8.2 11/30/2015   HGB 10.3* 11/30/2015   HCT 33.5* 11/30/2015   PLT 209 11/30/2015   GLUCOSE 198* 11/30/2015   ALT 28 11/27/2015   AST 26 11/27/2015   NA 143 11/30/2015   K 4.1 11/30/2015   CL 108 11/30/2015   CREATININE 0.81 11/30/2015   BUN 8 11/30/2015   CO2 26 11/30/2015   INR 1.06 11/27/2015   Small air lak with cough Reviewed path with patient and husband Cancer of overlapping sites of right female breast ([place])   Staging form: Breast, AJCC 7th Edition     Clinical: Stage IA (T1a, N0, M0) - Signed by Chauncey Cruel, MD on 06/05/2015 Lung cancer, right upper lobe [facility])   Staging form: Lung, AJCC 7th Edition     Pathologic stage from 11/30/2015: Stage IA (T1a, N0, cM0) - Signed by Grace Isaac, MD on 11/30/2015 Recurrent cancer of right breast [facility])   Staging form: Breast, AJCC 7th Edition     Clinical: Stage IA (T1c, N0, M0) - Signed by Chauncey Cruel, MD on  06/05/2015  Grace Isaac MD  Beeper [phone] Office [phone] 11/30/2015 6:11 PM

## 2015-11-30 NOTE — Progress Notes (Signed)
Patient ID: Crystal [name], female   DOB: 1956-01-06, 60 y.o.   MRN: [id number] [facility] DAILY ICU PROGRESS NOTE                   [address]            [place] [address]          [phone]   1 Day Post-Op Procedure(s) (LRB): BRONCHOSCOPY , VIDEO ASSISTED THORACOSCOPY (VATS)/WEDGE RESECTION (Right) Completion Right Upper  LOBECTOMY, Lymph node dissection, on Q placement (Right)  Total Length of Stay:  LOS: 1 day   Subjective: Some chest pain with cough , using pca  Objective: Vital signs in last 24 hours: Temp:  [97.5 F (36.4 C)-98.9 F (37.2 C)] 97.5 F (36.4 C) (02/16 0700) Pulse Rate:  [54-78] 60 (02/16 0800) Cardiac Rhythm:  [-] Sinus bradycardia (02/16 0800) Resp:  [10-24] 14 (02/16 0800) BP: (91-123)/(45-77) 97/52 mmHg (02/16 0800) SpO2:  [97 %-100 %] 100 % (02/16 0800) Arterial Line BP: (90-123)/(47-80) 120/72 mmHg (02/16 0500)  Filed Weights   11/29/15 0708  Weight: 141 lb 6.4 oz (64.139 kg)    Weight change:    Hemodynamic parameters for last 24 hours:    Intake/Output from previous day: 02/15 0701 - 02/16 0700 In: 3625 [I.V.:3625] Out: 2805 [Urine:2065; Blood:400; Chest Tube:340]  Intake/Output this shift: Total I/O In: 125 [I.V.:125] Out: -   Current Meds: Scheduled Meds: . bisacodyl  10 mg Oral Daily  . fentaNYL   Intravenous 6 times per day  . pantoprazole  40 mg Oral Daily  . senna-docusate  1 tablet Oral QHS   Continuous Infusions: . dextrose 5 % and 0.9% NaCl 125 mL/hr at 11/30/15 0800   PRN Meds:.diphenhydrAMINE **OR** diphenhydrAMINE, naloxone **AND** sodium chloride flush, ondansetron (ZOFRAN) IV, oxyCODONE, potassium chloride, traMADol  General appearance: alert, cooperative and no distress Neurologic: intact Heart: regular rate and rhythm, S1, S2 normal, no murmur, click, rub or gallop Lungs: diminished breath sounds bibasilar Abdomen: soft, non-tender; bowel sounds normal; no masses,  no  organomegaly Extremities: extremities normal, atraumatic, no cyanosis or edema and Homans sign is negative, no sign of DVT Wound: small air leak  Lab Results: CBC: Recent Labs  11/27/15 1403 11/30/15 0525  WBC 5.9 8.2  HGB 12.8 10.3*  HCT 39.2 33.5*  PLT 210 209   BMET:  Recent Labs  11/27/15 1403 11/30/15 0525  NA 141 143  K 3.9 4.1  CL 107 108  CO2 23 26  GLUCOSE 86 198*  BUN 12 8  CREATININE 0.69 0.81  CALCIUM 9.5 8.7*    PT/INR:  Recent Labs  11/27/15 1403  LABPROT 14.0  INR 1.06   Radiology: Dg Chest Port 1 View  11/30/2015  CLINICAL DATA:  Status post right upper lobectomy or spiculated nodule, lymph node dissection, history of recurrent breast malignancy EXAM: PORTABLE CHEST 1 VIEW COMPARISON:  Portable chest x-ray of November 29, 2015 FINDINGS: The lungs remain well-expanded. No pneumothorax is evident. The 2 right-sided chest tubes are in stable position. There is no mediastinal shift. There is no atelectasis or pneumonia. The heart and pulmonary vascularity are within the limits of normal. The Port-A-Cath appliance tip projects over the proximal SVC. The right internal jugular venous catheter tip projects over the midportion of the SVC. There are surgical clips in the right upper quadrant of the abdomen. IMPRESSION: Stable appearance of the chest without evidence of postprocedure complication. The support tubes are in reasonable position.  Electronically Signed   By: David  Martinique M.D.   On: 11/30/2015 08:10   Dg Chest Port 1 View  11/29/2015  CLINICAL DATA:  Postop right upper lobectomy. EXAM: PORTABLE CHEST 1 VIEW COMPARISON:  Radiographs 11/27/2015.  CT 10/31/2015. FINDINGS: 1542 hours. Two right-sided chest tubes extend to the right lung apex. There is no significant pneumothorax. There is a right IJ central venous catheter extending to upper right atrium. Pre-existing left subclavian Port-A-Cath is unchanged near the SVC right atrial junction. The heart size and  mediastinal contours are stable. There is mild atelectasis both lung bases. Postsurgical changes are present within the upper abdomen. IMPRESSION: No demonstrated complication following right upper lobe resection. Electronically Signed   By: Richardean Sale M.D.   On: 11/29/2015 15:51     Assessment/Plan: S/P Procedure(s) (LRB): BRONCHOSCOPY , VIDEO ASSISTED THORACOSCOPY (VATS)/WEDGE RESECTION (Right) Completion Right Upper  LOBECTOMY, Lymph node dissection, on Q placement (Right) Mobilize Diuresis Decrease iv rate  Aline and foley out Small air leak will leave ct in place  Ambulate has had SBO after previous surgeries, watch for distention   Grace Isaac 11/30/2015 8:32 AM

## 2015-11-30 NOTE — Progress Notes (Signed)
Utilization Review Completed.  

## 2015-12-01 ENCOUNTER — Inpatient Hospital Stay (HOSPITAL_COMMUNITY): Payer: BLUE CROSS/BLUE SHIELD

## 2015-12-01 LAB — CBC
HCT: 31.3 % — ABNORMAL LOW (ref 36.0–46.0)
HEMOGLOBIN: 9.6 g/dL — AB (ref 12.0–15.0)
MCH: 28.4 pg (ref 26.0–34.0)
MCHC: 30.7 g/dL (ref 30.0–36.0)
MCV: 92.6 fL (ref 78.0–100.0)
PLATELETS: 207 10*3/uL (ref 150–400)
RBC: 3.38 MIL/uL — AB (ref 3.87–5.11)
RDW: 13.2 % (ref 11.5–15.5)
WBC: 9.2 10*3/uL (ref 4.0–10.5)

## 2015-12-01 LAB — COMPREHENSIVE METABOLIC PANEL
ALK PHOS: 82 U/L (ref 38–126)
ALT: 22 U/L (ref 14–54)
ANION GAP: 8 (ref 5–15)
AST: 25 U/L (ref 15–41)
Albumin: 2.8 g/dL — ABNORMAL LOW (ref 3.5–5.0)
BUN: <5 mg/dL — ABNORMAL LOW (ref 6–20)
CALCIUM: 8.5 mg/dL — AB (ref 8.9–10.3)
CO2: 27 mmol/L (ref 22–32)
CREATININE: 0.63 mg/dL (ref 0.44–1.00)
Chloride: 104 mmol/L (ref 101–111)
Glucose, Bld: 117 mg/dL — ABNORMAL HIGH (ref 65–99)
Potassium: 3.3 mmol/L — ABNORMAL LOW (ref 3.5–5.1)
Sodium: 139 mmol/L (ref 135–145)
TOTAL PROTEIN: 5.3 g/dL — AB (ref 6.5–8.1)
Total Bilirubin: 0.5 mg/dL (ref 0.3–1.2)

## 2015-12-01 MED ORDER — POTASSIUM CHLORIDE CRYS ER 20 MEQ PO TBCR
20.0000 meq | EXTENDED_RELEASE_TABLET | Freq: Two times a day (BID) | ORAL | Status: AC
  Administered 2015-12-01 – 2015-12-02 (×2): 20 meq via ORAL
  Filled 2015-12-01 (×2): qty 1

## 2015-12-01 NOTE — Progress Notes (Signed)
Patient ID: Crystal [name], female   DOB: April 16, 1956, 60 y.o.   MRN: [id number] [facility] DAILY ICU PROGRESS NOTE                   [address]            [place] [address]          [phone]   2 Days Post-Op Procedure(s) (LRB): BRONCHOSCOPY , VIDEO ASSISTED THORACOSCOPY (VATS)/WEDGE RESECTION (Right) Completion Right Upper  LOBECTOMY, Lymph node dissection, on Q placement (Right)  Total Length of Stay:  LOS: 2 days   Subjective: Up in chair , walking around unit, pain control ok, appears comfortable  Objective: Vital signs in last 24 hours: Temp:  [97.7 F (36.5 C)-99.4 F (37.4 C)] 98.5 F (36.9 C) (02/17 0800) Pulse Rate:  [58-74] 66 (02/17 0800) Cardiac Rhythm:  [-] Normal sinus rhythm (02/17 0700) Resp:  [12-24] 14 (02/17 0800) BP: (92-145)/(43-79) 111/61 mmHg (02/17 0800) SpO2:  [93 %-100 %] 100 % (02/17 0800)  Filed Weights   11/29/15 0708  Weight: 141 lb 6.4 oz (64.139 kg)    Weight change:    Hemodynamic parameters for last 24 hours:    Intake/Output from previous day: 02/16 0701 - 02/17 0700 In: 2041.3 [P.O.:720; I.V.:1321.3] Out: 1540 [Urine:1310; Chest Tube:230]  Intake/Output this shift:    Current Meds: Scheduled Meds: . bisacodyl  10 mg Oral Daily  . enoxaparin (LOVENOX) injection  30 mg Subcutaneous Q24H  . fentaNYL   Intravenous 6 times per day  . pantoprazole  40 mg Oral Daily  . senna-docusate  1 tablet Oral QHS   Continuous Infusions: . dextrose 5 % and 0.9% NaCl 50 mL/hr at 12/01/15 0700   PRN Meds:.diphenhydrAMINE **OR** diphenhydrAMINE, naloxone **AND** sodium chloride flush, ondansetron (ZOFRAN) IV, oxyCODONE, potassium chloride, traMADol  General appearance: alert, cooperative and no distress Neurologic: intact Heart: regular rate and rhythm, S1, S2 normal, no murmur, click, rub or gallop Lungs: diminished breath sounds bibasilar Abdomen: soft, non-tender; bowel sounds normal; no masses,  no  organomegaly Extremities: extremities normal, atraumatic, no cyanosis or edema and Homans sign is negative, no sign of DVT Wound: no air leak this am  Lab Results: CBC: Recent Labs  11/30/15 0525 12/01/15 0415  WBC 8.2 9.2  HGB 10.3* 9.6*  HCT 33.5* 31.3*  PLT 209 207   BMET:  Recent Labs  11/30/15 0525 12/01/15 0415  NA 143 139  K 4.1 3.3*  CL 108 104  CO2 26 27  GLUCOSE 198* 117*  BUN 8 <5*  CREATININE 0.81 0.63  CALCIUM 8.7* 8.5*    PT/INR: No results for input(s): LABPROT, INR in the last 72 hours. Radiology: Dg Chest Port 1 View  12/01/2015  CLINICAL DATA:  Status post wedge resection of the rib right upper lobe mass EXAM: PORTABLE CHEST 1 VIEW COMPARISON:  Portable chest x-ray of November 30, 2015 FINDINGS: An approximately 10% apical pneumothorax is evident. This was previously obscured by the adjacent lateral chest tube. There are 2 chest tubes present in the right pulmonary apex which are stable in position. There is no alveolar infiltrate nor large pleural effusion. The left lung is clear. The heart is normal in size. The Port-A-Cath appliance is in stable position. There is increased density in the right perihilar region. The observed bony thorax is unremarkable. IMPRESSION: An approximately 10% right apical pneumothorax is present with 2 chest tubes located immediately adjacent to the pleural line. This pneumothorax  was likely obscured by the overlying upper lateral chest tube on yesterday's study. The remainder of the chest appears stable allowing for slight differences in positioning. The support tubes are in reasonable position. Critical Value/emergent results were called by telephone at the time of interpretation on 12/01/2015 at 7:47 am to Norberta Keens, RN, who verbally acknowledged these results. Electronically Signed   By: David  Martinique M.D.   On: 12/01/2015 07:48     Assessment/Plan: S/P Procedure(s) (LRB): BRONCHOSCOPY , VIDEO ASSISTED THORACOSCOPY  (VATS)/WEDGE RESECTION (Right) Completion Right Upper  LOBECTOMY, Lymph node dissection, on Q placement (Right) Mobilize  History of SBO after surgery, bowel sounds present advancing po diet slowly Ct to water seal, no air leak today small apical cap present - poss d/c one chest tube tomorrow Expected Acute  Blood - loss Anemia Hypokalemia, replace k+   Cancer of overlapping sites of right female breast ([place])   Staging form: Breast, AJCC 7th Edition     Clinical: Stage IA (T1a, N0, M0) - Signed by Chauncey Cruel, MD on 06/05/2015 Lung cancer, right upper lobe [facility])   Staging form: Lung, AJCC 7th Edition     Pathologic stage from 11/30/2015: Stage IA (T1a, N0, cM0) - Signed by Grace Isaac, MD on 11/30/2015 Recurrent cancer of right breast [facility])   Staging form: Breast, AJCC 7th Edition     Clinical: Stage IA (T1c, N0, M0) - Signed by Chauncey Cruel, MD on 06/05/2015    Grace Isaac 12/01/2015 9:07 AM

## 2015-12-01 NOTE — Care Management Note (Signed)
Case Management Note  Patient Details  Name: Crystal [name] MRN: [id number] Date of Birth: [birth year]  Subjective/Objective:       Pt is s/p lobectomy              Action/Plan:  Pt is from home with husband - current plan is for pt to discharge back home - no CM needs determined at this time.  CM will continue to monitor for disposition needs   Expected Discharge Date:                  Expected Discharge Plan:  Home/Self Care  In-House Referral:     Discharge planning Services  CM Consult  Post Acute Care Choice:    Choice offered to:     DME Arranged:    DME Agency:     HH Arranged:    HH Agency:     Status of Service:  In process, will continue to follow  Medicare Important Message Given:    Date Medicare IM Given:    Medicare IM give by:    Date Additional Medicare IM Given:    Additional Medicare Important Message give by:     If discussed at [place] of Stay Meetings, dates discussed:    Additional Comments:  Maryclare Labrador, RN 12/01/2015, 2:25 PM

## 2015-12-01 NOTE — Progress Notes (Signed)
CT surgery p.m. Rounds  Patient examined and record reviewed.Hemodynamics stable,labs satisfactory.Patient had stable day.Continue current care. Crystal [name] 12/01/2015

## 2015-12-02 ENCOUNTER — Inpatient Hospital Stay (HOSPITAL_COMMUNITY): Payer: BLUE CROSS/BLUE SHIELD

## 2015-12-02 LAB — CBC
HCT: 31.1 % — ABNORMAL LOW (ref 36.0–46.0)
Hemoglobin: 9.7 g/dL — ABNORMAL LOW (ref 12.0–15.0)
MCH: 28.4 pg (ref 26.0–34.0)
MCHC: 31.2 g/dL (ref 30.0–36.0)
MCV: 91.2 fL (ref 78.0–100.0)
Platelets: 171 10*3/uL (ref 150–400)
RBC: 3.41 MIL/uL — ABNORMAL LOW (ref 3.87–5.11)
RDW: 12.9 % (ref 11.5–15.5)
WBC: 7.2 10*3/uL (ref 4.0–10.5)

## 2015-12-02 LAB — BASIC METABOLIC PANEL
Anion gap: 8 (ref 5–15)
BUN: <5 mg/dL — ABNORMAL LOW (ref 6–20)
CO2: 27 mmol/L (ref 22–32)
Calcium: 8.5 mg/dL — ABNORMAL LOW (ref 8.9–10.3)
Chloride: 104 mmol/L (ref 101–111)
Creatinine, Ser: 0.61 mg/dL (ref 0.44–1.00)
GFR calc Af Amer: >60 mL/min (ref >60)
GFR calc non Af Amer: >60 mL/min (ref >60)
Glucose, Bld: 89 mg/dL (ref 65–99)
Potassium: 3.5 mmol/L (ref 3.5–5.1)
Sodium: 139 mmol/L (ref 135–145)

## 2015-12-02 MED ORDER — AMIODARONE HCL IN DEXTROSE 360-4.14 MG/200ML-% IV SOLN: 30.0000 mg/h | INTRAVENOUS | Status: DC

## 2015-12-02 MED ORDER — AMIODARONE LOAD VIA INFUSION
150.0000 mg | Freq: Once | INTRAVENOUS | Status: DC
  Filled 2015-12-02: qty 83.34

## 2015-12-02 MED ORDER — METOPROLOL TARTRATE 12.5 MG HALF TABLET
12.5000 mg | ORAL_TABLET | Freq: Two times a day (BID) | ORAL | Status: DC
  Administered 2015-12-03: 12.5 mg via ORAL
  Filled 2015-12-02 (×2): qty 1

## 2015-12-02 MED ORDER — AMIODARONE HCL IN DEXTROSE 360-4.14 MG/200ML-% IV SOLN
60.0000 mg/h | INTRAVENOUS | Status: DC
  Filled 2015-12-02: qty 200

## 2015-12-02 MED ORDER — METOPROLOL TARTRATE 1 MG/ML IV SOLN
5.0000 mg | Freq: Once | INTRAVENOUS | Status: AC
  Administered 2015-12-02: 5 mg via INTRAVENOUS
  Filled 2015-12-02: qty 5

## 2015-12-02 NOTE — Progress Notes (Signed)
Report given to 3S nurse.  Pt has no s/s of any acute distress or c/o pain

## 2015-12-02 NOTE — Anesthesia Postprocedure Evaluation (Signed)
Anesthesia Post Note  Patient: Crystal [name]  Procedure(s) Performed: Procedure(s) (LRB): BRONCHOSCOPY , VIDEO ASSISTED THORACOSCOPY (VATS)/WEDGE RESECTION (Right) Completion Right Upper  LOBECTOMY, Lymph node dissection, on Q placement (Right)  Patient location during evaluation: PACU Anesthesia Type: General Level of consciousness: awake Vital Signs Assessment: post-procedure vital signs reviewed and stable Respiratory status: spontaneous breathing Cardiovascular status: stable Postop Assessment: no signs of nausea or vomiting Anesthetic complications: no    Last Vitals:  Filed Vitals:   12/02/15 0847 12/02/15 0900  BP:  105/75  Pulse:    Temp: 37.6 C   Resp:  27    Last Pain:  Filed Vitals:   12/02/15 0936  PainSc: 7                  Breann Losano

## 2015-12-03 ENCOUNTER — Inpatient Hospital Stay (HOSPITAL_COMMUNITY): Payer: BLUE CROSS/BLUE SHIELD

## 2015-12-03 LAB — CBC
HCT: 27.7 % — ABNORMAL LOW (ref 36.0–46.0)
Hemoglobin: 8.7 g/dL — ABNORMAL LOW (ref 12.0–15.0)
MCH: 28.6 pg (ref 26.0–34.0)
MCHC: 31.4 g/dL (ref 30.0–36.0)
MCV: 91.1 fL (ref 78.0–100.0)
Platelets: 164 10*3/uL (ref 150–400)
RBC: 3.04 MIL/uL — ABNORMAL LOW (ref 3.87–5.11)
RDW: 12.9 % (ref 11.5–15.5)
WBC: 4.9 10*3/uL (ref 4.0–10.5)

## 2015-12-03 LAB — BASIC METABOLIC PANEL
Anion gap: 7 (ref 5–15)
BUN: <5 mg/dL — ABNORMAL LOW (ref 6–20)
CO2: 29 mmol/L (ref 22–32)
Calcium: 8.3 mg/dL — ABNORMAL LOW (ref 8.9–10.3)
Chloride: 105 mmol/L (ref 101–111)
Creatinine, Ser: 0.52 mg/dL (ref 0.44–1.00)
GFR calc Af Amer: >60 mL/min (ref >60)
GFR calc non Af Amer: >60 mL/min (ref >60)
Glucose, Bld: 112 mg/dL — ABNORMAL HIGH (ref 65–99)
Potassium: 3.1 mmol/L — ABNORMAL LOW (ref 3.5–5.1)
Sodium: 141 mmol/L (ref 135–145)

## 2015-12-03 NOTE — Discharge Summary (Signed)
Physician Discharge Summary  Patient ID: KRISSI WILLAIMS MRN: [id number] DOB/AGE: 21-Jan-1956 60 y.o.  Admit date: 11/29/2015 Discharge date: 12/04/2015  Admission Diagnoses:  Patient Active Problem List   Diagnosis Date Noted  . Lung cancer, right upper lobe ([place]) 11/30/2015  . Adenoma of liver 11/06/2015  . Genetic testing 08/11/2015  . Recurrent cancer of right breast ([place]) 06/05/2015  . Cancer of overlapping sites of right female breast ([place]) 03/16/2012   Discharge Diagnoses:   Patient Active Problem List   Diagnosis Date Noted  . Lung cancer, right upper lobe ([place]) 11/30/2015  . S/P lobectomy of lung 11/29/2015  . Adenoma of liver 11/06/2015  . Genetic testing 08/11/2015  . Recurrent cancer of right breast ([place]) 06/05/2015  . Cancer of overlapping sites of right female breast ([place]) 03/16/2012   Discharged Condition: good  History of Present Illness:  Ms. [place]bank is a 60 yo white female with history of recurrent breast cancer S/P right mastectomy done September 2016.  She underwent CT scan in August of last year at which time she was found to have a 1.4 cm spiculated lesion in her right upper lobe.  She is currently undergoing chemotherapy for her breast cancer treatment.  She was referred to [facility] for evaluation of the lung nodule.  She was evaluated by Dr. Servando Snare on 11/20/2015 at which time it was recommended she undergo VATS procedure for resection vs. Lobectomy if the nodule was found to be lung cancer.  The risks and benefits of the procedure were explained to the patient and she was agreeable to proceed.   Hospital Course:   She presented to [facility] on 11[facility]29[facility]2015.  She was taken to the operating room and underwent Right VATS with Mini Thoracotomy, Wedge resection of RUL, completion lobectomy of the right upper lobe, lymph node dissection, and insertion of ON-Q pain catheter.  She tolerated the procedure, was extubated and taken to the SICU in stable condition.   During her stay in the SICU the patient progressed without much difficulty.  She did have some post operative nausea which responded to anti-emetics.  Her chest tube initially showed an air leak and was left on suction.  This later resolved on POD #2 and her chest tubes were placed on water seal.  Her CXR remained stable and did not show evidence of pneumothorax.  Her anterior chest tube was removed on POD #3.  Her follow up CXR was again free from pneumothorax.  Her remaining chest tube was free from air leak and removed on POD #4.  Follow up CXR showed stable appearance.  She has a history of small bowel obstruction.  Her diet was advanced slowly.  She is tolerating a regular diet.  She has moved her bowels.  She continues to ambulate without difficulty and her pain is well controlled.  She is felt medically stable for discharge home today.   Significant Diagnostic Studies: nuclear medicine: PET CT  1. Evidence of FDG accumulation in a small spiculated right upper lobe pulmonary nodule. While the uptake is low level, given the morphologic features of this nodule along with the discernible FDG uptake, neoplasm must be considered. This could represent primary bronchogenic neoplasm versus metastatic disease from breast cancer. 2. Innumerable heterogeneous lesions scattered throughout the liver in this patient with evidence of prior partial hepatectomy. These liver lesions have been present since 1995 and are seen to be hypermetabolic on today's PET images. Hypermetabolism is known to occur in hepatocellular adenoma. Given  the volume of hypermetabolic lesions scattered throughout the liver, hypermetabolic liver metastases would be difficult to detect.  PATHOLOGY:  Diagnosis 1. Lung, wedge biopsy/resection, Right upper lobe - INVASIVE ADENOCARCINOMA, WELL DIFFERENTIATED, SPANNING 1.4 CM - ADENOCARCINOMA IS PRESENT AT THE PARENCHYMAL MARGIN OF SPECIMEN #1. - LYMPHOVASCULAR INVASION IS  IDENTIFIED. - 1 BENIGN INTRAPULMONARY LYMPH NODE (0/1). - SEE ONCOLOGY TABLE BELOW. 2. Lung, resection (segmental or lobe), Right upper lobe - BENIGN LUNG PARENCHYMA WITH INTRAALVEOLAR HEMORRHAGE. - THERE IS NO EVIDENCE OF MALIGNANCY. 3. Lymph node, biopsy, 10 R - THERE IS NO EVIDENCE OF CARCINOMA IN 1 OF 1 LYMPH NODE (0/1). 4. Lymph node, biopsy, 10 R #2 - THERE IS NO EVIDENCE OF CARCINOMA IN 1 OF 1 LYMPH NODE (0/1). 5. Lymph node, biopsy, 5 R - THERE IS NO EVIDENCE OF CARCINOMA IN 1 OF 1 LYMPH NODE (0/1). 6. Lymph node, biopsy, 12 R - THERE IS NO EVIDENCE OF CARCINOMA IN 1 OF 1 LYMPH NODE (0/1). 7. Lymph node, biopsy, 11 R - THERE IS NO EVIDENCE OF CARCINOMA IN 1 OF 1 LYMPH NODE (0/1). 8. Lymph node, biopsy, 4 R - THERE IS NO EVIDENCE OF CARCINOMA IN 1 OF 1 LYMPH NODE (0/1). 9. Lymph node, biopsy, 5 R#2 - THERE IS NO EVIDENCE OF CARCINOMA IN 1 OF 1 LYMPH NODE (0/1). 10. Lymph node, biopsy, 12 R #2 - THERE IS NO EVIDENCE OF CARCINOMA IN 1 OF 1 LYMPH NODE (0/1).  Treatments: surgery:   VIDEO ASSISTED THORACOSCOPY (VATS)/WEDGE RESECTION (Right) Right Upper LOBECTOMY (Right) FLEXIBLE BRONCHOSCOPY Node dissection On Q Placement  Disposition: 01-Home or Self Care   Discharge Medications:     Medication List    TAKE these medications        b complex vitamins tablet  Take 1 tablet by mouth daily.     calcium-vitamin D 500-200 MG-UNIT tablet  Commonly known as:  OSCAL WITH D  Take 2 tablets by mouth daily.     CLEAR EYES FOR DRY EYES OP  Apply 1 drop to eye as needed (dry eye).     famotidine 10 MG chewable tablet  Commonly known as:  PEPCID AC  Chew 10 mg by mouth daily as needed for heartburn.     fish oil-omega-3 fatty acids 1000 MG capsule  Take 2 g by mouth daily.     fulvestrant 250 MG/5ML injection  Commonly known as:  FASLODEX  Inject 500 mg into the muscle every 30 (thirty) days. One injection each buttock over 1-2 minutes. Warm prior to use.      glucosamine-chondroitin 500-400 MG tablet  Take 1 tablet by mouth 3 (three) times daily.     ibuprofen 200 MG tablet  Commonly known as:  ADVIL,MOTRIN  Take 200 mg by mouth every 6 (six) hours as needed for fever or mild pain.     lidocaine-prilocaine cream  Commonly known as:  EMLA  Apply to affected area once     multivitamin with minerals tablet  Take 1 tablet by mouth daily.     traMADol 50 MG tablet  Commonly known as:  ULTRAM  Take 1-2 tablets (50-100 mg total) by mouth every 6 (six) hours as needed (mild pain).     TURMERIC PO  Take 500 mg by mouth daily.       Follow-up Information    Follow up with Grace Isaac, MD In 2 weeks.   Specialty:  Cardiothoracic Surgery   Why:  Office will contact you with  appointment   Contact information:   [address] [place] [address] [phone]       Follow up with [facility] In 2 weeks.   Why:  please get CXR 30 min prior to your appointment with Dr. Newell Coral, located on first floor of our office building   Contact information:   [place]       Follow up with [facility] [place].   Specialty:  Cardiothoracic Surgery   Why:  office will contact with a suture removal appt with the nurse   Contact information:   [place], [place] [phone]      Signed: John Giovanni 12/04/2015, 7:26 AM

## 2015-12-03 NOTE — Progress Notes (Addendum)
      [address]       [name],Crystal [name]             [phone]      4 Days Post-Op Procedure(s) (LRB): BRONCHOSCOPY , VIDEO ASSISTED THORACOSCOPY (VATS)/WEDGE RESECTION (Right) Completion Right Upper  LOBECTOMY, Lymph node dissection, on Q placement (Right)   Subjective:  Crystal [name] states she is doing okay for the most part.  She does have some incisional pain which is worse with movement.  She asks to advance her diet to regular today.  She has moved her bowels  Objective: Vital signs in last 24 hours: Temp:  [97.1 F (36.2 C)-100 F (37.8 C)] 98.3 F (36.8 C) (02/19 0920) Pulse Rate:  [63-96] 79 (02/19 0920) Cardiac Rhythm:  [-] Normal sinus rhythm (02/19 0700) Resp:  [14-25] 16 (02/19 0920) BP: (83-123)/(43-65) 95/54 mmHg (02/19 0425) SpO2:  [92 %-100 %] 93 % (02/19 0920)  Intake/Output from previous day: 02/18 0701 - 02/19 0700 In: 1352.3 [P.O.:400; I.V.:952.3] Out: 2025 [Urine:2025] Intake/Output this shift: Total I/O In: -  Out: 100 [Chest Tube:100]  General appearance: alert, cooperative and no distress Heart: regular rate and rhythm Lungs: clear to auscultation bilaterally Abdomen: soft, non-tender; bowel sounds normal; no masses,  no organomegaly Wound: clean and dry  Lab Results:  Recent Labs  12/02/15 0612 12/03/15 0430  WBC 7.2 4.9  HGB 9.7* 8.7*  HCT 31.1* 27.7*  PLT 171 164   BMET:  Recent Labs  12/02/15 0612 12/03/15 0430  NA 139 141  K 3.5 3.1*  CL 104 105  CO2 27 29  GLUCOSE 89 112*  BUN <5* <5*  CREATININE 0.61 0.52  CALCIUM 8.5* 8.3*    PT/INR: No results for input(s): LABPROT, INR in the last 72 hours. ABG    Component Value Date/Time   PHART 7.386 11/30/2015 0533   HCO3 24.4* 11/30/2015 0533   TCO2 26 11/30/2015 0533   ACIDBASEDEF 1.0 11/30/2015 0533   O2SAT 95.0 11/30/2015 0533   CBG (last 3)  No results for input(s): GLUCAP in the last 72 hours.  Assessment/Plan: S/P Procedure(s)  (LRB): BRONCHOSCOPY , VIDEO ASSISTED THORACOSCOPY (VATS)/WEDGE RESECTION (Right) Completion Right Upper  LOBECTOMY, Lymph node dissection, on Q placement (Right)  1. Chest tube- no air, leak, 100 cc output overnight- CXR is free from pneumothorax- likely remove final chest tube today 2. Gi- H/O PO SBO- tolerating full liquids, has moved bowels, will advance diet 3. CV- hemodynamically stable 4. dispo- patient stable, likely d/c chest tube today, remove PCA if chest tube removed, possibly ready for d/c in AM   LOS: 4 days    [name], Crystal 12/03/2015 Tube out today and home in am patient examined and medical record reviewed,agree with above note. Tharon Aquas Trigt III 12/03/2015

## 2015-12-03 NOTE — Discharge Instructions (Signed)
Lung Resection, Care After Refer to this sheet in the next few weeks. These instructions provide you with information on caring for yourself after your procedure. Your health care provider may also give you more specific instructions. Your treatment has been planned according to current medical practices, but problems sometimes occur. Call your health care provider if you have any problems or questions after your procedure. WHAT TO EXPECT AFTER THE PROCEDURE After your procedure, it is typical to have the following:   You may feel pain in your chest and throat.  Patients may sometimes shiver or feel nauseous during recovery. HOME CARE INSTRUCTIONS  You may resume a normal diet and activities as directed by your health care provider.  Do not use any tobacco products, including cigarettes, chewing tobacco, or electronic cigarettes. If you need help quitting, ask your health care provider.  There are many different ways to close and cover an incision, including stitches, skin glue, and adhesive strips. Follow your health care provider's instructions on:  Incision care.  Bandage (dressing) changes and removal.  Incision closure removal.  Take medicines only as directed by your health care provider.  Keep all follow-up visits as directed by your health care provider. This is important.  Try to breathe deeply and cough as directed. Holding a pillow firmly over your ribs may help with discomfort.  If you were given an incentive spirometer in the hospital, continue to use it as directed by your health care provider.  Walk as directed by your health care provider.  You may take a shower and gently wash the area of your incision with water and soap as directed by your health care provider. Do not use anything else to clean your incision except as directed by your health care provider. Do not take baths, swim, or use a hot tub until your health care provider approves. SEEK MEDICAL CARE  IF:  You notice redness, swelling, or increasing pain at the incision site.  You are bleeding at the incision site.  You see pus coming from the incision site.  You notice a bad smell coming from the incision site or bandage.  Your incision breaks open.  You cough up blood or pus, or you develop a cough that produces bad-smelling sputum.  You have pain or swelling in your legs.  You have increasing pain that is not controlled with medicine.  You have trouble managing any of the tubes that have been left in place after surgery.  You have fever or chills. SEEK IMMEDIATE MEDICAL CARE IF:   You have chest pain or an irregular or rapid heartbeat.  You have dizzy episodes or faint.  You have shortness of breath or difficulty breathing.  You have persistent nausea or vomiting.  You have a rash.   This information is not intended to replace advice given to you by your health care provider. Make sure you discuss any questions you have with your health care provider.   Document Released: 04/19/2005 Document Revised: 10/21/2014 Document Reviewed: 11/19/2013 Elsevier Interactive Patient Education [employer].

## 2015-12-04 ENCOUNTER — Inpatient Hospital Stay (HOSPITAL_COMMUNITY): Payer: BLUE CROSS/BLUE SHIELD

## 2015-12-04 LAB — TYPE AND SCREEN
ABO/RH(D): O POS
Antibody Screen: POSITIVE
DAT, IgG: NEGATIVE
Donor AG Type: NEGATIVE
Donor AG Type: NEGATIVE
PT AG Type: NEGATIVE
Unit division: 0
Unit division: 0

## 2015-12-04 MED ORDER — METOPROLOL TARTRATE 25 MG PO TABS: 12.5000 mg | ORAL_TABLET | Freq: Two times a day (BID) | ORAL | Status: DC

## 2015-12-04 MED ORDER — TRAMADOL HCL 50 MG PO TABS: 50.0000 mg | ORAL_TABLET | Freq: Four times a day (QID) | ORAL | Status: DC | PRN

## 2015-12-04 NOTE — Progress Notes (Signed)
Discharge instructions given to patient and patients husband, all questions answered at this time.  Patient verbalized understanding of teaching.  Pt. VSS with no s/s of distress noted.  Pt. Stable at discharge.

## 2015-12-04 NOTE — Op Note (Signed)
NAMEDANAH, Crystal [name]              ACCOUNT NO.:  [id number]  MEDICAL RECORD NO.:  [id number]  LOCATION:                                 FACILITY:  PHYSICIAN:  Lanelle Bal, MD    DATE OF BIRTH:  10-Sep-1956  DATE OF PROCEDURE:  11/29/2015 OPERATIVE REPORT  PREOPERATIVE DIAGNOSIS:  Right upper lobe lung mass.  POSTOPERATIVE DIAGNOSIS:  Right upper lobe lung mass, non-small cell primary lung cancer favored on frozen section.  PROCEDURE PERFORMED:  Video-assisted flexible bronchoscopy, video- assisted thoracoscopy, wedge resection right upper lobe, completion right upper lobectomy with node dissection and placement of On-Q device.  SURGEON:  Lanelle Bal, MD.  FIRST ASSISTANT:  John Giovanni, PA-C.  BRIEF HISTORY:  The patient is a 60 year old female with previous history of breast cancer having recently completed surgery and chemotherapy for a stage IA carcinoma of the right breast.  During the patient's evaluation, a 1.2-cm right upper lobe spiculated mass was appreciated that was mildly hypermetabolic without any evidence of other significant mediastinal disease.  The patient was referred for further evaluation.  She had good pulmonary function.  Risks and options of surgical resection were discussed with the patient in detail and she was willing to proceed.  With no preop tissue diagnosis, we discussed proceeding with wedge resection first and if malignancy is confirmed, completing a right upper lobectomy.  Risks and options were discussed with the patient in detail and she signed informed consent.  DESCRIPTION OF PROCEDURE:  The patient was brought to the operating room, underwent general endotracheal anesthesia with a double-lumen endotracheal tube.  Through the double-lumen endotracheal tube and after appropriate time-out was performed, fiberoptic bronchoscopy was performed without any obvious endobronchial lesions to the subsegmental level bilaterally.  The  double-lumen endotracheal tube was in good position.  The patient was then turned in lateral decubitus position with the right side up.  A second time-out was performed.  Right chest was prepped with Betadine and draped in usual sterile manner.  The right side had previously been marked.  The right lung was collapsed.  A port site was made approximately at the fourth intercostal space in the mid axillary line.  Through this, a 30-degree videoscope was introduced into the chest.  There were significant adhesions of the lower and middle lobe to the diaphragm probably related to the patient's two previous liver resections.  The adhesions to the middle lobe were taken down to fully examine and mobilize the upper lobe.  Neither the major nor minor fissure were very complete.  The lesion itself was not readily evident visually.  The port site was enlarged slightly and through two additional ports sites, one more anterior and one more posterior and lower, the lung was manipulated and the lesion palpated through the port sites.  A wedge resection of the upper lobe was performed.  The specimen was submitted to Pathology.  The pathologist confirmed adenocarcinoma but non-small cell carcinoma favored, adeno as the primary lesion.  The initial stapled margin was positive.  We proceeded with completion lobectomy first dissecting along the anterior mediastinum identifying the upper lobe pulmonary veins.  This was encircled with a vascular stapler and divided saving the branch to the middle lobe.  The dissection was carried anterior posteriorly identifying the pulmonary artery trunk to the upper lobe.  In a similar fashion, this was dissected free and encircled and stapled with a vascular stapler.  A second arterial branch to the upper lobe smaller was also divided. Dissection along the pulmonary artery confirmed patency of the branches to the middle lobe and lower lobe and superior segment of the  lower lobe.  We continued with our dissection and the bronchus to the upper lobe was isolated.  A thick load stapler was then placed across the upper lobe bronchus, inflation of the middle and lower lobe was confirmed, the bronchus was then divided.  We then proceeded with developing the major and minor fissures which noted were not very complete  using the stapler.  The specimen was removed and placed in a specimen bag and brought out through our utilitarian incision and submitted to Pathology.  The final path showed negative bronchial and vascular and stapled margins.  We then proceeded with lymph node dissection including 5R, 4R, 10 11, 12R lymph nodes each labeled individually from each area and submitted to Pathology.  The bronchial stump was tested for air leak. The adhesions of the lower lobe were  dissected off the diaphragm as muchas possible but were fairly dense.   The middle lobe did free up from the diaphragm and was tacked to the lower lobe to prevent any postoperative torsion.   An On-Q device was then tunneled subpleurally along the posterior ribs 2, one standard chest tube anteriorly and one Blake drain posteriorly were placed through the port. Two port sites were then used and secured.  Pericostal sutures were placed around the upper rib and through small drill hole in the lower rib.  The lung reinflated nicely.  Interrupted Vicryl sutures were placed in the deep layers, running 2-0 Vicryl in subcutaneous tissue, and a 3-0 subcuticular stitch in skin edges.  Sponge and needle count was reported as correct at the completion of the procedure.  The patient tolerated the procedure without obvious complication and was transferred to the Surgical Intensive care Unit for further postoperative care. Estimated blood loss approximately 350 mL.    Lanelle Bal, MD     EG/MEDQ  D:  12/04/2015  T:  12/04/2015  Job:  [id number]

## 2015-12-04 NOTE — Progress Notes (Signed)
      [address]       [place] [address]             [phone]      5 Days Post-Op Procedure(s) (LRB): BRONCHOSCOPY , VIDEO ASSISTED THORACOSCOPY (VATS)/WEDGE RESECTION (Right) Completion Right Upper  LOBECTOMY, Lymph node dissection, on Q placement (Right) Subjective: feeling pretty good   Objective: Vital signs in last 24 hours: Temp:  [98.1 F (36.7 C)-98.5 F (36.9 C)] 98.5 F (36.9 C) (02/20 0346) Pulse Rate:  [62-79] 62 (02/19 2305) Cardiac Rhythm:  [-] Normal sinus rhythm (02/19 2030) Resp:  [15-24] 18 (02/19 2305) BP: (102-121)/(58-68) 102/63 mmHg (02/19 2305) SpO2:  [93 %-100 %] 97 % (02/19 2305)  Hemodynamic parameters for last 24 hours:    Intake/Output from previous day: 02/19 0701 - 02/20 0700 In: 1730 [P.O.:480; I.V.:1250] Out: 130 [Chest Tube:130] Intake/Output this shift:    General appearance: alert, cooperative and no distress Heart: regular rate and rhythm and soft systolic murmur Lungs: clear to auscultation bilaterally Abdomen: benign Extremities: no edema Wound: incis healing well  Lab Results:  Recent Labs  12/02/15 0612 12/03/15 0430  WBC 7.2 4.9  HGB 9.7* 8.7*  HCT 31.1* 27.7*  PLT 171 164   BMET:  Recent Labs  12/02/15 0612 12/03/15 0430  NA 139 141  K 3.5 3.1*  CL 104 105  CO2 27 29  GLUCOSE 89 112*  BUN <5* <5*  CREATININE 0.61 0.52  CALCIUM 8.5* 8.3*    PT/INR: No results for input(s): LABPROT, INR in the last 72 hours. ABG    Component Value Date/Time   PHART 7.386 11/30/2015 0533   HCO3 24.4* 11/30/2015 0533   TCO2 26 11/30/2015 0533   ACIDBASEDEF 1.0 11/30/2015 0533   O2SAT 95.0 11/30/2015 0533   CBG (last 3)  No results for input(s): GLUCAP in the last 72 hours.  Meds Scheduled Meds: . bisacodyl  10 mg Oral Daily  . enoxaparin (LOVENOX) injection  30 mg Subcutaneous Q24H  . metoprolol tartrate  12.5 mg Oral BID  . pantoprazole  40 mg Oral Daily  . senna-docusate  1 tablet Oral  QHS   Continuous Infusions: . dextrose 5 % and 0.9% NaCl 50 mL/hr at 12/04/15 0300   PRN Meds:.ondansetron (ZOFRAN) IV, oxyCODONE, potassium chloride, traMADol  Xrays Dg Chest Port 1 View  12/03/2015  CLINICAL DATA:  Short of breath EXAM: PORTABLE CHEST 1 VIEW COMPARISON:  12/02/2015 FINDINGS: One right chest tube removed. The other remains. Left subclavian Port-A-Cath stable. No pneumothorax. Low volumes. Grossly clear. Cardiomegaly. IMPRESSION: One right chest tube removed without pneumothorax. Electronically Signed   By: Marybelle Killings M.D.   On: 12/03/2015 09:15    Assessment/Plan: S/P Procedure(s) (LRB): BRONCHOSCOPY , VIDEO ASSISTED THORACOSCOPY (VATS)/WEDGE RESECTION (Right) Completion Right Upper  LOBECTOMY, Lymph node dissection, on Q placement (Right) Plan for discharge: see discharge orders Doesn't want to take metoprolol and HR is fine- will d/c  LOS: 5 days    Raeana Blinn E 12/04/2015

## 2015-12-04 NOTE — Progress Notes (Signed)
Updated by radiology patients chest xray shows a new small right pneumothorax.  PA Gold updated on patients condition.  Received no new orders at this time.

## 2015-12-04 NOTE — Care Management Note (Signed)
Case Management Note  Patient Details  Name: SOLANGE EMRY MRN: [id number] Date of Birth: 18-Oct-1955  Subjective/Objective:   Patient for dc today , pta indep.  No needs.                 Action/Plan:   Expected Discharge Date:                  Expected Discharge Plan:  Home/Self Care  In-House Referral:     Discharge planning Services  CM Consult  Post Acute Care Choice:    Choice offered to:     DME Arranged:    DME Agency:     HH Arranged:    [place] Agency:     Status of Service:  Completed, signed off  Medicare Important Message Given:    Date Medicare IM Given:    Medicare IM give by:    Date Additional Medicare IM Given:    Additional Medicare Important Message give by:     If discussed at [place] of Stay Meetings, dates discussed:    Additional Comments:  Zenon Mayo, RN 12/04/2015, 11:01 AM

## 2015-12-11 ENCOUNTER — Ambulatory Visit (INDEPENDENT_AMBULATORY_CARE_PROVIDER_SITE_OTHER): Payer: Self-pay

## 2015-12-11 DIAGNOSIS — Z4802 Encounter for removal of sutures: Secondary | ICD-10-CM

## 2015-12-11 DIAGNOSIS — Z902 Acquired absence of lung [part of]: Secondary | ICD-10-CM

## 2015-12-11 NOTE — Progress Notes (Signed)
Removed 4 chest tube sutures with no signs of infection and patient tolerated well. 

## 2015-12-15 ENCOUNTER — Other Ambulatory Visit: Payer: Self-pay | Admitting: *Deleted

## 2015-12-15 DIAGNOSIS — C50811 Malignant neoplasm of overlapping sites of right female breast: Secondary | ICD-10-CM

## 2015-12-18 ENCOUNTER — Ambulatory Visit (HOSPITAL_BASED_OUTPATIENT_CLINIC_OR_DEPARTMENT_OTHER): Payer: BLUE CROSS/BLUE SHIELD | Admitting: Oncology

## 2015-12-18 ENCOUNTER — Telehealth: Payer: Self-pay | Admitting: Oncology

## 2015-12-18 ENCOUNTER — Other Ambulatory Visit: Payer: Self-pay | Admitting: Oncology

## 2015-12-18 ENCOUNTER — Ambulatory Visit (HOSPITAL_BASED_OUTPATIENT_CLINIC_OR_DEPARTMENT_OTHER): Payer: BLUE CROSS/BLUE SHIELD

## 2015-12-18 ENCOUNTER — Other Ambulatory Visit (HOSPITAL_BASED_OUTPATIENT_CLINIC_OR_DEPARTMENT_OTHER): Payer: BLUE CROSS/BLUE SHIELD

## 2015-12-18 VITALS — BP 114/55 | HR 72 | Temp 98.5°F | Resp 18 | Ht 66.0 in | Wt 134.7 lb

## 2015-12-18 DIAGNOSIS — C3411 Malignant neoplasm of upper lobe, right bronchus or lung: Secondary | ICD-10-CM | POA: Diagnosis not present

## 2015-12-18 DIAGNOSIS — R0789 Other chest pain: Secondary | ICD-10-CM

## 2015-12-18 DIAGNOSIS — C50412 Malignant neoplasm of upper-outer quadrant of left female breast: Secondary | ICD-10-CM | POA: Diagnosis not present

## 2015-12-18 DIAGNOSIS — C50811 Malignant neoplasm of overlapping sites of right female breast: Secondary | ICD-10-CM

## 2015-12-18 DIAGNOSIS — Z5111 Encounter for antineoplastic chemotherapy: Secondary | ICD-10-CM

## 2015-12-18 DIAGNOSIS — C50911 Malignant neoplasm of unspecified site of right female breast: Secondary | ICD-10-CM

## 2015-12-18 LAB — CBC WITH DIFFERENTIAL/PLATELET
BASO%: 1 % (ref 0.0–2.0)
Basophils Absolute: 0.1 10*3/uL (ref 0.0–0.1)
EOS%: 5.9 % (ref 0.0–7.0)
Eosinophils Absolute: 0.4 10*3/uL (ref 0.0–0.5)
HCT: 36 % (ref 34.8–46.6)
HGB: 11.5 g/dL — ABNORMAL LOW (ref 11.6–15.9)
LYMPH%: 21.5 % (ref 14.0–49.7)
MCH: 27.8 pg (ref 25.1–34.0)
MCHC: 32 g/dL (ref 31.5–36.0)
MCV: 86.9 fL (ref 79.5–101.0)
MONO#: 0.4 10*3/uL (ref 0.1–0.9)
MONO%: 6.5 % (ref 0.0–14.0)
NEUT#: 4.2 10*3/uL (ref 1.5–6.5)
NEUT%: 65.1 % (ref 38.4–76.8)
PLATELETS: 371 10*3/uL (ref 145–400)
RBC: 4.14 10*6/uL (ref 3.70–5.45)
RDW: 13.8 % (ref 11.2–14.5)
WBC: 6.4 10*3/uL (ref 3.9–10.3)
lymph#: 1.4 10*3/uL (ref 0.9–3.3)

## 2015-12-18 LAB — COMPREHENSIVE METABOLIC PANEL
ALT: 27 U/L (ref 0–55)
ANION GAP: 11 meq/L (ref 3–11)
AST: 21 U/L (ref 5–34)
Albumin: 3.6 g/dL (ref 3.5–5.0)
Alkaline Phosphatase: 139 U/L (ref 40–150)
BUN: 12.6 mg/dL (ref 7.0–26.0)
CHLORIDE: 105 meq/L (ref 98–109)
CO2: 23 meq/L (ref 22–29)
Calcium: 9.5 mg/dL (ref 8.4–10.4)
Creatinine: 0.8 mg/dL (ref 0.6–1.1)
EGFR: 82 mL/min/{1.73_m2} — AB (ref >90)
Glucose: 122 mg/dl (ref 70–140)
POTASSIUM: 3.8 meq/L (ref 3.5–5.1)
Sodium: 140 mEq/L (ref 136–145)
Total Bilirubin: <0.3 mg/dL (ref 0.20–1.20)
Total Protein: 6.9 g/dL (ref 6.4–8.3)

## 2015-12-18 MED ORDER — FULVESTRANT 250 MG/5ML IM SOLN
500.0000 mg | Freq: Once | INTRAMUSCULAR | Status: AC
  Administered 2015-12-18: 500 mg via INTRAMUSCULAR
  Filled 2015-12-18: qty 10

## 2015-12-18 NOTE — Progress Notes (Signed)
ID: Crystal [name]   DOB: 1956/01/14  MR#: [id number]  POE#:[id number]  PCP: Kandice Hams, MD GYN: SU: [name] MD OTHER MD: Earle Gell, Daneen Schick, Arnoldo Hooker Thimmappa]  CHIEF COMPLAINT: recurrent estrogen receptor positive breast cancer in previously irradiated breast; pT1a lung cancer  CURRENT TREATMENT: Fulvestrant  BREAST CANCER HISTORY:   From the original intake note:  The patient had bilateral diagnostic mammography 02/12/2011.  She has a history of cystic changes noted on prior studies in April 2011 and April 2010.  On the Feb 12, 2011 study there were new calcifications identified superiorly in the right breast, without any obvious mass.  The patient was brought back for further studies, 02/19/2011 and Dr. Marcelo Baldy was able to localize the calcifications under stereotactic guidance.  The biopsy ([id number]) showed high-grade ductal carcinoma with papillary features which was 70% estrogen receptor positive, 0% progesterone receptor positive.  There was no definitive invasion and therefore further studies were not performed.   Bilateral breast MRIs were obtained Feb 26, 2011.  This showed marked nodular enhancement throughout both breasts, and in the upper central portion there was a post biopsy area of change measuring up to 3.7 cm.  Most of this is a fluid cavity with a thin rim of enhancement.  There were no suspicious areas in the left breast.  No internal mammary or axillary lymph nodes.  Of course, the patient has a history of hepatic adenomas and these were noted incidentally on the MRI.   Her definitive surgical treatment and subsequent history are as detailed below.  INTERVAL HISTORY: Crystal [name] returns today for follow up of her breast and lung cancers. Since her [name] visit here, a right upper lobe lung mass was further evaluated by thoracic surgery and after appropriate discussion on 12/04/2015 she underwent video assisted flexible bronchoscopy and thoracoscopy, with  right upper lobectomy and lymph node dissection. The pathology from this procedure ([id number]) showed an invasive adenocarcinoma, well-differentiated, measuring 1.4 cm, focally present at the initial parenchymal margin, with evidence of lymphovascular invasion, and 1 benign intrapulmonary lymph node. She had additional resection of the right upper lobe which showed no evidence of malignancy and there was a total of 9 additional lymph nodes (6 N1 and 3 N2) all negative. Special studies included estrogen and progesterone receptor as well as gross cystic disease fluid protein, all negative. The tumor was positive for napsin A and TTF-1-1.  As far as her breast cancer is concerned, she continues on fulvestrant. She tolerates the injections with no significant side effects. She has been having some hot flashes but these are minimal.  REVIEW OF SYSTEMS: Crystal [name] is still experiencing significant pain from her chest surgery. She really does not want to take oxycodone or any narcotics. She is barely agreeable to take tramadol. She is taking 400 mg of ibuprofen every 6 hours, with food, and she also has Pepcid on board. She uses the tramadol as needed. Raynelle Chary she is not very active this works. As soon as she is tries to get more active (she walked 7000 steps yesterday) then it gets touchy. She's also having some range of motion issues with the right shoulder. He is a little bit of a runny nose. She had an episode of palpitations at home which she thought might be related to low potassium, but more likely was due to dehydration. She has been a little bit short of breath climbing stairs. She had heartburn yesterday after eating quite a bit of ice cream. Aside from  these issues a detailed review of systems today was noncontributory  PAST MEDICAL HISTORY: Past Medical History  Diagnosis Date  . History of small bowel obstruction   . Liver masses   . Insomnia   . GERD (gastroesophageal reflux disease)   . Complication  of anesthesia     hard to wake up  . Cancer [facility])     right breast  . Anxiety   history of small-bowel obstruction with partial colectomy March 2001.  The patient has a history of hepatic adenomas.  She has had liver resections x2.  The data for this is at [facility] and we will try to obtain it.  She has also had incisional hernia repair.  She has a history of osteopenia, and she has a history of multiple anti-red cell antibodies making a transfusion difficult.  The patient tells me that she has a history of low calcium and a tendency to get liver injections.  PAST SURGICAL HISTORY: Past Surgical History  Procedure Laterality Date  . Liver surgery    . Laparotomies    . Hernia repair      incisional hernia repair x3  . Breast surgery Right   . Mastectomy w/ sentinel node biopsy Right 07/05/2015    Procedure: RIGHT MASTECTOMY WITH SENTINEL NODE BIOPSY AND PORT A CATH INSERTION;  Surgeon: [name], MD;  Location: [place];  Service: General;  Laterality: Right;  . Portacath placement Left 07/05/2015    Procedure: INSERTION PORT-A-CATH;  Surgeon: [name], MD;  Location: [place];  Service: General;  Laterality: Left;  . Video assisted thoracoscopy (vats)/wedge resection Right 11/29/2015    Procedure: BRONCHOSCOPY , VIDEO ASSISTED THORACOSCOPY (VATS)/WEDGE RESECTION;  Surgeon: Grace Isaac, MD;  Location: [facility];  Service: Thoracic;  Laterality: Right;  . Lobectomy Right 11/29/2015    Procedure: Completion Right Upper  LOBECTOMY, Lymph node dissection, on Q placement;  Surgeon: Grace Isaac, MD;  Location: [place];  Service: Thoracic;  Laterality: Right;    FAMILY HISTORY Family History  Problem Relation Age of Onset  . Ovarian cancer Mother 57    "metastasized to breasts"  . Breast cancer Maternal Grandmother 48    2-3 recurrences  . Stomach cancer Maternal Grandfather     dx. 47-49; metastatic stomach cancer  . Colon polyps Father      about 2-3 polyps removed at each colonoscopy  . Colon polyps Brother     "a few"  . Other Maternal Aunt     +TAH  . Colon cancer Paternal Aunt 90  . Brain cancer Daughter 17    astrocytoma  . Breast cancer Cousin     dx. 4s  The patient's mother died from ovarian cancer at the age of 74.  The patient's father is alive at age 12.  She has 2 brothers, no sisters.  A maternal grandmother was diagnosed with breast cancer at the age of 59, and the maternal grandfather had stomach cancer at the age of 7.  GYNECOLOGIC HISTORY: The patient had menarche at age 69.  Her [name] menstrual period was in 2005.  She never used hormone replacement.  She used birth control briefly to control bleeding prior to 1980.  She was 60 years old at the birth of her first child.  She started having mammograms at age 62 because of a strong family history.  SOCIAL HISTORY: Jaylei works as a Programmer, applications for [employer].   She also  heads the local "[employer]" Her husband, Leyana Whidden, is a retired Engineer, maintenance from a [employer] locally.  Son, [name], lives in [place] and works for that [employer]  Daughter, [name], lives in [place] and is a homemaker.   ADVANCED DIRECTIVES: in place  HEALTH MAINTENANCE: Social History  Substance Use Topics  . Smoking status: Never Smoker   . Smokeless tobacco: Never Used  . Alcohol Use: 0.6 oz/week    1 Glasses of wine per week     Comment: social     Colonoscopy:  PAP:  Bone density:  Lipid panel:  Allergies  Allergen Reactions  . Promethazine Hcl Other (See Comments)    Causes shaking  . Tylenol [Acetaminophen] Other (See Comments)    Avoids Tylenol products due to liver disease   . Penicillins Rash    Current Outpatient Prescriptions  Medication Sig Dispense Refill  . b complex vitamins tablet Take 1 tablet by mouth daily.      . calcium-vitamin D (OSCAL WITH D) 500-200 MG-UNIT per tablet Take 2 tablets  by mouth daily.    . Carboxymethylcellul-Glycerin (CLEAR EYES FOR DRY EYES OP) Apply 1 drop to eye as needed (dry eye).     . famotidine (PEPCID AC) 10 MG chewable tablet Chew 10 mg by mouth daily as needed for heartburn.    . fulvestrant (FASLODEX) 250 MG/5ML injection Inject 500 mg into the muscle every 30 (thirty) days. One injection each buttock over 1-2 minutes. Warm prior to use.    Marland Kitchen glucosamine-chondroitin 500-400 MG tablet Take 1 tablet by mouth 3 (three) times daily.      Marland Kitchen ibuprofen (ADVIL,MOTRIN) 200 MG tablet Take 200 mg by mouth every 6 (six) hours as needed for fever or mild pain.     Marland Kitchen lidocaine-prilocaine (EMLA) cream Apply to affected area once (Patient taking differently: Apply 1 application topically as needed (for port). ) 30 g 3  . Multiple Vitamins-Minerals (MULTIVITAMIN WITH MINERALS) tablet Take 1 tablet by mouth daily.      . traMADol (ULTRAM) 50 MG tablet Take 1-2 tablets (50-100 mg total) by mouth every 6 (six) hours as needed (mild pain). 50 tablet 0   No current facility-administered medications for this visit.    OBJECTIVE: Middle-aged white woman  Filed Vitals:   12/18/15 0840  BP: 114/55  Pulse: 72  Temp: 98.5 F (36.9 C)  Resp: 18     Body mass index is 21.75 kg/(m^2).    ECOG FS: 1  Sclerae unicteric, pupils round and equal Oropharynx clear and moist-- no thrush or other lesions No cervical or supraclavicular adenopathy Lungs no rales or rhonchi Heart regular rate and rhythm Abd soft, nontender, positive bowel sounds MSK no focal spinal tenderness, no upper extremity lymphedema; The right flank incision is cosmetically very favorable and there is no dehiscence, erythema, or swelling Neuro: nonfocal, well oriented, appropriate affect Breasts: The right breast is status post mastectomy. There is no evidence of local recurrence. The right axilla is benign. The left breast is unremarkable    LAB RESULTS: Lab Results  Component Value Date   WBC 6.4  12/18/2015   NEUTROABS 4.2 12/18/2015   HGB 11.5* 12/18/2015   HCT 36.0 12/18/2015   MCV 86.9 12/18/2015   PLT 371 12/18/2015      Chemistry      Component Value Date/Time   NA 140 12/18/2015 0816   NA 141 12/03/2015 0430   K 3.8 12/18/2015 0816  K 3.1* 12/03/2015 0430   CL 105 12/03/2015 0430   CL 102 03/15/2013 0912   CO2 23 12/18/2015 0816   CO2 29 12/03/2015 0430   BUN 12.6 12/18/2015 0816   BUN <5* 12/03/2015 0430   CREATININE 0.8 12/18/2015 0816   CREATININE 0.52 12/03/2015 0430      Component Value Date/Time   CALCIUM 9.5 12/18/2015 0816   CALCIUM 8.3* 12/03/2015 0430   ALKPHOS 139 12/18/2015 0816   ALKPHOS 82 12/01/2015 0415   AST 21 12/18/2015 0816   AST 25 12/01/2015 0415   ALT 27 12/18/2015 0816   ALT 22 12/01/2015 0415   BILITOT <0.30 12/18/2015 0816   BILITOT 0.5 12/01/2015 0415       Lab Results  Component Value Date   LABCA2 28 10/20/2012    No components found for: [id number]  No results for input(s): INR in the [name] 168 hours.  Urinalysis    Component Value Date/Time   COLORURINE YELLOW 11/27/2015 1403    STUDIES: Dg Chest 2 View  12/04/2015  CLINICAL DATA:  60 year old female with a history of lung carcinoma. Previous chest tube removal. EXAM: CHEST - 2 VIEW COMPARISON:  Multiple prior chest x-ray, most recent 12/03/2015, 12/02/2015 FINDINGS: Cardiomediastinal silhouette unchanged. No evidence of pulmonary vascular congestion. Interval removal of right apical thoracostomy tube, with small pneumothorax status post removal. Unchanged position of right IJ central catheter. Unchanged position of port catheter on the left chest wall. Surgical changes of right lung, with clips at the right hilum. Similar appearance of asymmetric elevation the right hemidiaphragm. Similar appearance of chronic interstitial opacities. Blunting the right costophrenic angle and left costophrenic angle, with opacification in the costophrenic sulcus on the lateral view. No  left pneumothorax. No displaced fracture. IMPRESSION: Interval removal of right apical thoracostomy tube, with small pneumothorax. Small bilateral pleural effusions and associated atelectasis. Unchanged right IJ central catheter. Unchanged left subclavian approach port catheter. These results were called by telephone at the time of interpretation on 12/04/2015 at 7:38 am to the nurse caring for her, Ms Austin Miles, who verbally acknowledged these results. Signed, Dulcy Fanny. Earleen Newport, DO [facility] Signed, Dulcy Fanny. Earleen Newport, DO [facility] Electronically Signed   By: Corrie Mckusick D.O.   On: 12/04/2015 07:40   Dg Chest 2 View  11/27/2015  CLINICAL DATA:  Preoperative for surgical removal of right lung mass. EXAM: CHEST  2 VIEW COMPARISON:  PET-CT October 31, 2015, chest x-ray July 05, 2015 FINDINGS: The heart size and mediastinal contours are stable. Left central venous line is identified with distal tip in the superior vena cava. There is no focal infiltrate, pulmonary edema, or pleural effusion. The CT noted small spiculated mass in the right upper lobe is not well seen on the chest x-ray. The visualized skeletal structures are stable. IMPRESSION: No active cardiopulmonary disease. The CT noted mass in the right upper lobe is not well seen on the chest x-ray. Electronically Signed   By: Abelardo Diesel M.D.   On: 11/27/2015 14:40   Dg Chest Port 1 View  12/03/2015  CLINICAL DATA:  Short of breath EXAM: PORTABLE CHEST 1 VIEW COMPARISON:  12/02/2015 FINDINGS: One right chest tube removed. The other remains. Left subclavian Port-A-Cath stable. No pneumothorax. Low volumes. Grossly clear. Cardiomegaly. IMPRESSION: One right chest tube removed without pneumothorax. Electronically Signed   By: Marybelle Killings M.D.   On: 12/03/2015 09:15   Dg Chest [facility]  1 View  12/02/2015  CLINICAL DATA:  Chest tube  in place. Hx of R breast ca. Pt had a R lobectomy on 11/29/15. EXAM: PORTABLE CHEST 1 VIEW COMPARISON:  12/01/2015 FINDINGS: The small right apical pneumothorax appears slightly decreased in size from the previous day's study. Postsurgical changes on the right with volume loss stable. No lung consolidation or edema. Bilateral right chest tubes are stable. Right internal jugular central venous line and left anterior chest wall Port-A-Cath are stable. IMPRESSION: 1. Slight decrease in the small right apical pneumothorax from the previous day's study. 2. No other change.  Support apparatus remains well-positioned. Electronically Signed   By: Lajean Manes M.D.   On: 12/02/2015 08:31   Dg Chest Port 1 View  12/01/2015  CLINICAL DATA:  Status post wedge resection of the rib right upper lobe mass EXAM: PORTABLE CHEST 1 VIEW COMPARISON:  Portable chest x-ray of November 30, 2015 FINDINGS: An approximately 10% apical pneumothorax is evident. This was previously obscured by the adjacent lateral chest tube. There are 2 chest tubes present in the right pulmonary apex which are stable in position. There is no alveolar infiltrate nor large pleural effusion. The left lung is clear. The heart is normal in size. The Port-A-Cath appliance is in stable position. There is increased density in the right perihilar region. The observed bony thorax is unremarkable. IMPRESSION: An approximately 10% right apical pneumothorax is present with 2 chest tubes located immediately adjacent to the pleural line. This pneumothorax was likely obscured by the overlying upper lateral chest tube on yesterday's study. The remainder of the chest appears stable allowing for slight differences in positioning. The support tubes are in reasonable position. Critical Value/emergent results were called by telephone at the time of interpretation on 12/01/2015 at 7:47 am to Norberta Keens, RN, who verbally acknowledged these results. Electronically Signed   By:  David  Martinique M.D.   On: 12/01/2015 07:48   Dg Chest Port 1 View  11/30/2015  CLINICAL DATA:  Status post right upper lobectomy or spiculated nodule, lymph node dissection, history of recurrent breast malignancy EXAM: PORTABLE CHEST 1 VIEW COMPARISON:  Portable chest x-ray of November 29, 2015 FINDINGS: The lungs remain well-expanded. No pneumothorax is evident. The 2 right-sided chest tubes are in stable position. There is no mediastinal shift. There is no atelectasis or pneumonia. The heart and pulmonary vascularity are within the limits of normal. The Port-A-Cath appliance tip projects over the proximal SVC. The right internal jugular venous catheter tip projects over the midportion of the SVC. There are surgical clips in the right upper quadrant of the abdomen. IMPRESSION: Stable appearance of the chest without evidence of postprocedure complication. The support tubes are in reasonable position. Electronically Signed   By: David  Martinique M.D.   On: 11/30/2015 08:10   Dg Chest Port 1 View  11/29/2015  CLINICAL DATA:  Postop right upper lobectomy. EXAM: PORTABLE CHEST 1 VIEW COMPARISON:  Radiographs 11/27/2015.  CT 10/31/2015. FINDINGS: 1542 hours. Two right-sided chest tubes extend to the right lung apex. There is no significant pneumothorax. There is a right IJ central venous catheter extending to upper right atrium. Pre-existing left subclavian Port-A-Cath is unchanged near the SVC right atrial junction. The heart size and mediastinal contours are stable. There is mild atelectasis both lung bases. Postsurgical changes are present within the upper abdomen. IMPRESSION: No demonstrated complication following right upper lobe resection. Electronically Signed   By: Richardean Sale M.D.   On: 11/29/2015 15:51  ASSESSMENT: 60 y.o.  BRCA 1-2 and p-53 negative [place] woman status post right lumpectomy and sentinel lymph node sampling June 2012 for a T1a N0, Stage IA  invasive ductal carcinoma, grade  3, estrogen receptor 98% positive, progesterone receptor 97% positive, with an MIB-1 of 5% and no HER-2 amplification   (a) note that the initial Right breast biopsy showed DCIS w papillary architecture, estrogen receptor 70% positive, progesterone receptor negative  (1) completed radiation September 2012.   (2) on letrozole starting July 2012, discontinued August 2016 with recurrence  RECURRENT DISEASE: (3) right breast upper outer quadrant biopsy 05/23/2015 was positive for a clinical T1c N0, stage IA invasive ductal carcinoma, grade 2, estrogen receptor 60% positive with moderate staining intensity, progesterone receptor negative, with no HER-2 amplification and an MIB-1 of 90%.  (4) status post right simple mastectomy 07/05/2015 for a pT1c pN0, stage IA invasive ductal carcinoma, grade 3, with negative margins. 4 lymph nodes removed with mastectomy were benign.  (a) the patient is not planning on reconstruction  (5) began cyclophosphamide and docetaxel 07/26/2015, repeated every 21 days 4, final dose 09/26/2015  (6) genetics testing 07/20/2015 was normal and did not reveal a deleterious mutation in ATM, BARD1, BRCA1, BRCA2, BRIP1, CDH1, CHEK2, FANCC, MLH1, MSH2, MSH6, NBN, PALB2, PMS2, PTEN, RAD51C, RAD51D, TP53, and XRCC2. Additionally no variant of uncertain significance were found.   (7) RUL spiculated nodule measuring 1.4 cm noted on staging CT scan 06/13/2015-- stable on repeat CT 07/25/15--moderately hot and slightly larger on PET 10/31/2015  (a) status post right upper lobectomy and lymph node dissection 11/29/2015 for a pT1a pN0, stage IA adenocarcinoma, low-grade, with negative margins   (8) history of multiple hepatic adenomas  (9) fulvestrant started 10/23/2015  PLAN:  We went over her Sandy's lung cancer pathology and she understands this tumor has nothing to do with her breast cancer. More specifically it was estrogen and progesterone receptor negative. This is a primary  lung cancer. It was very early stage and requires no further treatment other than follow-up.  She still recovering in terms of pain. I suggest that she continue her Motrin as she is doing, every 6 hours, but also takes the tramadol round the clock, every 6 hours, so she will be taking pain medication every 3 hours. The point of this is to allow her to function as normally as possible. Once things get better she can start backing off on the tramadol. Eventually she will go off the Motrin as well.   Once she is feeling better we will refer her to physical therapy.  As far as breast cancer is concerned, she is ready to have her port removed at any time and I will let Dr. Rush Farmer know that. There is of course no rush on that. She will continue the fulvestrant which she is tolerating well. She is planning a beach trip in June and that may delay one dose until early July.  Otherwise she will see [place] again in one month and then again 2 months from now. Once she is clearly recovered from her multiple surgeries we will start seeing her on an every three-month basis. MAGRINAT,GUSTAV C    12/18/2015

## 2015-12-18 NOTE — Telephone Encounter (Signed)
Scheduled appts per [facility] 3/6 pof. Avs printed

## 2015-12-20 ENCOUNTER — Other Ambulatory Visit: Payer: Self-pay | Admitting: Cardiothoracic Surgery

## 2015-12-20 DIAGNOSIS — C3490 Malignant neoplasm of unspecified part of unspecified bronchus or lung: Secondary | ICD-10-CM

## 2015-12-21 ENCOUNTER — Ambulatory Visit (INDEPENDENT_AMBULATORY_CARE_PROVIDER_SITE_OTHER): Payer: Self-pay | Admitting: Cardiothoracic Surgery

## 2015-12-21 ENCOUNTER — Ambulatory Visit
Admission: RE | Admit: 2015-12-21 | Discharge: 2015-12-21 | Disposition: A | Discharge status: Home / Self Care | Payer: BLUE CROSS/BLUE SHIELD | Source: Ambulatory Visit | Attending: Cardiothoracic Surgery | Admitting: Cardiothoracic Surgery

## 2015-12-21 ENCOUNTER — Encounter: Payer: Self-pay | Admitting: Cardiothoracic Surgery

## 2015-12-21 VITALS — BP 105/69 | HR 70 | Resp 20 | Ht 66.0 in | Wt 134.0 lb

## 2015-12-21 DIAGNOSIS — C50911 Malignant neoplasm of unspecified site of right female breast: Secondary | ICD-10-CM

## 2015-12-21 DIAGNOSIS — C3490 Malignant neoplasm of unspecified part of unspecified bronchus or lung: Secondary | ICD-10-CM

## 2015-12-21 DIAGNOSIS — C3411 Malignant neoplasm of upper lobe, right bronchus or lung: Secondary | ICD-10-CM

## 2015-12-21 DIAGNOSIS — J939 Pneumothorax, unspecified: Secondary | ICD-10-CM

## 2015-12-21 NOTE — Progress Notes (Signed)
[address]       [place] [address]             [phone]      Crystal [name], Crystal [name]  Referring: Magrinat, Virgie Dad, MD Primary Care: Kandice Hams, MD  Chief Complaint:   POST OP FOLLOW UP 11/29/2015 OPERATIVE REPORT PREOPERATIVE DIAGNOSIS: Right upper lobe lung mass. POSTOPERATIVE DIAGNOSIS: Right upper lobe lung mass, non-small cell primary lung cancer favored on frozen section. PROCEDURE PERFORMED: Video-assisted flexible bronchoscopy, video- assisted thoracoscopy, wedge resection right upper lobe, completion right upper lobectomy with node dissection and placement of On-Q device. SURGEON: Lanelle Bal, MD.  Cancer of overlapping sites of right female breast [facility])   Staging form: Breast, AJCC 7th Edition     Clinical: Stage IA (T1a, N0, M0) - Signed by Chauncey Cruel, MD on 8/[name]/2016 Lung cancer, right upper lobe [facility])   Staging form: Lung, AJCC 7th Edition     Pathologic stage from 11/30/2015: Stage IA (T1a, N0, cM0) - Signed by Grace Isaac, MD on 11/30/2015 Recurrent cancer of right breast [facility])   Staging form: Breast, AJCC 7th Edition     Clinical: Stage IA (T1c, N0, M0) - Signed by Chauncey Cruel, MD on 8/[name]/2016    History of Present Illness:      On 12/04/2015 she underwent video assisted  thoracoscopy, with right upper lobectomy and lymph node dissection. The pathology from this procedure ([id number]) showed an invasive adenocarcinoma, well-differentiated, measuring 1.4 cm, focally present at the initial parenchymal margin, with evidence of lymphovascular invasion, and 1 benign intrapulmonary lymph node. She had additional resection of the right upper lobe which showed no evidence of malignancy and there was a total of 9 additional lymph nodes (6 N1 and 3 N2) all negative. Special studies included estrogen and progesterone receptor as well as gross cystic disease  fluid protein, all negative. The tumor was positive for napsin A and TTF-1-1  Since discharge the patient is been doing well increasing her physical activity appropriately. She's had no fever or chills,    Past Medical History  Diagnosis Date  . History of small bowel obstruction   . Liver masses   . Insomnia   . GERD (gastroesophageal reflux disease)   . Complication of anesthesia     hard to wake up  . Cancer [facility])     right breast  . Anxiety      History  Smoking status  . Never Smoker   Smokeless tobacco  . Never Used    History  Alcohol Use  . 0.6 oz/week  . 1 Glasses of wine per week    Comment: social     Allergies  Allergen Reactions  . Promethazine Hcl Other (See Comments)    Causes shaking  . Tylenol [Acetaminophen] Other (See Comments)    Avoids Tylenol products due to liver disease   . Penicillins Rash    Current Outpatient Prescriptions  Medication Sig Dispense Refill  . b complex vitamins tablet Take 1 tablet by mouth daily.      . calcium-vitamin D (OSCAL WITH D) 500-200 MG-UNIT per tablet Take 2 tablets by mouth daily.    . Carboxymethylcellul-Glycerin (CLEAR EYES FOR DRY EYES OP) Apply 1 drop to eye as needed (dry eye).     . famotidine (PEPCID AC) 10 MG chewable tablet Chew 10 mg by mouth daily as needed for heartburn.    Marland Kitchen  fulvestrant (FASLODEX) 250 MG/5ML injection Inject 500 mg into the muscle every 30 (thirty) days. One injection each buttock over 1-2 minutes. Warm prior to use.    Marland Kitchen glucosamine-chondroitin 500-400 MG tablet Take 1 tablet by mouth 3 (three) times daily.      Marland Kitchen ibuprofen (ADVIL,MOTRIN) 200 MG tablet Take 200 mg by mouth every 6 (six) hours as needed for fever or mild pain.     Marland Kitchen lidocaine-prilocaine (EMLA) cream Apply to affected area once (Patient taking differently: Apply 1 application topically as needed (for port). ) 30 g 3  . Multiple Vitamins-Minerals (MULTIVITAMIN WITH MINERALS) tablet Take 1 tablet by mouth daily.        . traMADol (ULTRAM) 50 MG tablet Take 1-2 tablets (50-100 mg total) by mouth every 6 (six) hours as needed (mild pain). 50 tablet 0   No current facility-administered medications for this visit.       Physical Exam: BP 105/69 mmHg  Pulse 70  Resp 20  Ht '5\' 6"'$  (1.676 m)  Wt 134 lb (60.782 kg)  BMI 21.64 kg/m2  SpO2 96%  General appearance: alert, cooperative and no distress Neurologic: intact Heart: regular rate and rhythm, S1, S2 normal, no murmur, click, rub or gallop Lungs: clear to auscultation bilaterally Abdomen: soft, non-tender; bowel sounds normal; no masses,  no organomegaly Extremities: extremities normal, atraumatic, no cyanosis or edema Wound: Right chest incision and port sites are well-healed and sutures are removed   Diagnostic Studies & Laboratory data:     Recent Radiology Findings:   Dg Chest 2 View  12/21/2015  CLINICAL DATA:  Status post right lobectomy for lung malignancy on November 29, 2015; history of right breast malignancy, follow-up study. EXAM: CHEST  2 VIEW COMPARISON:  PA and lateral chest x-ray of December 04, 2015 FINDINGS: There is at approximately 20% right-sided pneumothorax. This has increased in size since the previous study. Blunting of the right lateral costophrenic angle persists. There is a small amount of subcutaneous emphysema over the right lower lateral axillary region. The left lung is clear. The heart and pulmonary vascularity are normal. The Port-A-Cath appliance tip projects over the proximal SVC. The bony thorax exhibits no acute abnormality. IMPRESSION: The previously demonstrated small right apical pneumothorax has increased in size and occupies approximately 20% of the lung volume. There may be a trace of pleural fluid on the right. There is no mediastinal shift. Critical Value/emergent results were called by telephone at the time of interpretation on 12/21/2015 at 9:07 am to Elnita Maxwell, RN, , who verbally acknowledged these results.  Electronically Signed   By: David  Martinique M.D.   On: 12/21/2015 09:11      Recent Lab Findings: Lab Results  Component Value Date   WBC 6.4 12/18/2015   HGB 11.5* 12/18/2015   HCT 36.0 12/18/2015   PLT 371 12/18/2015   GLUCOSE 122 12/18/2015   ALT 27 12/18/2015   AST 21 12/18/2015   NA 140 12/18/2015   K 3.8 12/18/2015   CL 105 12/03/2015   CREATININE 0.8 12/18/2015   BUN 12.6 12/18/2015   CO2 23 12/18/2015   INR 1.06 11/27/2015      Assessment / Plan:    Stable following right upper lobectomy for stage IA adenocarcinoma of the lung, with small apical space.  Follow-up chest x-ray in 3 weeks She's aware to call if she has any increasing shortness of breath or development of subcutaneous air, fever or chills    Grace Isaac  MD      [address] [place] [address] Office [phone]   Beeper [phone]  12/21/2015 9:28 AM

## 2016-01-10 ENCOUNTER — Other Ambulatory Visit: Payer: Self-pay | Admitting: Cardiothoracic Surgery

## 2016-01-10 DIAGNOSIS — C3490 Malignant neoplasm of unspecified part of unspecified bronchus or lung: Secondary | ICD-10-CM

## 2016-01-11 ENCOUNTER — Encounter: Payer: Self-pay | Admitting: Cardiothoracic Surgery

## 2016-01-11 ENCOUNTER — Encounter: Payer: BLUE CROSS/BLUE SHIELD | Admitting: Cardiothoracic Surgery

## 2016-01-11 ENCOUNTER — Ambulatory Visit
Admission: RE | Admit: 2016-01-11 | Discharge: 2016-01-11 | Disposition: A | Discharge status: Home / Self Care | Payer: BLUE CROSS/BLUE SHIELD | Source: Ambulatory Visit | Attending: Cardiothoracic Surgery | Admitting: Cardiothoracic Surgery

## 2016-01-11 ENCOUNTER — Ambulatory Visit (INDEPENDENT_AMBULATORY_CARE_PROVIDER_SITE_OTHER): Payer: Self-pay | Admitting: Cardiothoracic Surgery

## 2016-01-11 ENCOUNTER — Other Ambulatory Visit: Payer: Self-pay | Admitting: *Deleted

## 2016-01-11 VITALS — BP 125/73 | HR 71 | Resp 16 | Ht 66.0 in | Wt 135.0 lb

## 2016-01-11 DIAGNOSIS — G8918 Other acute postprocedural pain: Secondary | ICD-10-CM

## 2016-01-11 DIAGNOSIS — Z902 Acquired absence of lung [part of]: Secondary | ICD-10-CM

## 2016-01-11 DIAGNOSIS — C3490 Malignant neoplasm of unspecified part of unspecified bronchus or lung: Secondary | ICD-10-CM

## 2016-01-11 DIAGNOSIS — C3411 Malignant neoplasm of upper lobe, right bronchus or lung: Secondary | ICD-10-CM

## 2016-01-11 DIAGNOSIS — C50911 Malignant neoplasm of unspecified site of right female breast: Secondary | ICD-10-CM

## 2016-01-11 MED ORDER — TRAMADOL HCL 50 MG PO TABS: 50.0000 mg | ORAL_TABLET | Freq: Four times a day (QID) | ORAL | Status: DC | PRN

## 2016-01-11 NOTE — Progress Notes (Signed)
[address]       [place] [address]             [phone]      Crystal [name] [place] Medical Record #[id number] Date of Birth: May 13, 1956  Referring: Magrinat, Virgie Dad, MD Primary Care: Kandice Hams, MD  Chief Complaint:   POST OP FOLLOW UP 11/29/2015 OPERATIVE REPORT PREOPERATIVE DIAGNOSIS: Right upper lobe lung mass. POSTOPERATIVE DIAGNOSIS: Right upper lobe lung mass, non-small cell primary lung cancer favored on frozen section. PROCEDURE PERFORMED: Video-assisted flexible bronchoscopy, video- assisted thoracoscopy, wedge resection right upper lobe, completion right upper lobectomy with node dissection and placement of On-Q device. SURGEON: Lanelle Bal, MD.  Cancer of overlapping sites of right female breast [facility])   Staging form: Breast, AJCC 7th Edition     Clinical: Stage IA (T1a, N0, M0) - Signed by Chauncey Cruel, MD on 06/05/2015 Lung cancer, right upper lobe [facility])   Staging form: Lung, AJCC 7th Edition     Pathologic stage from 11/30/2015: Stage IA (T1a, N0, cM0) - Signed by Grace Isaac, MD on 11/30/2015 Recurrent cancer of right breast [facility])   Staging form: Breast, AJCC 7th Edition     Clinical: Stage IA (T1c, N0, M0) - Signed by Chauncey Cruel, MD on 06/05/2015    History of Present Illness:      On 12/04/2015 she underwent video assisted  thoracoscopy, with right upper lobectomy and lymph node dissection. The pathology from this procedure ([id number]) showed an invasive adenocarcinoma, well-differentiated, measuring 1.4 cm, focally present at the initial parenchymal margin, with evidence of lymphovascular invasion, and 1 benign intrapulmonary lymph node. She had additional resection of the right upper lobe which showed no evidence of malignancy and there was a total of 9 additional lymph nodes (6 N1 and 3 N2) all negative. Special studies included estrogen and progesterone receptor as well as gross cystic disease  fluid protein, all negative. The tumor was positive for napsin A and TTF-1-1  Since discharge the patient is been doing well increasing her physical activity appropriately. She's had no fever or chills,    Past Medical History  Diagnosis Date  . History of small bowel obstruction   . Liver masses   . Insomnia   . GERD (gastroesophageal reflux disease)   . Complication of anesthesia     hard to wake up  . Cancer [facility])     right breast  . Anxiety      History  Smoking status  . Never Smoker   Smokeless tobacco  . Never Used    History  Alcohol Use  . 0.6 oz/week  . 1 Glasses of wine per week    Comment: social     Allergies  Allergen Reactions  . Promethazine Hcl Other (See Comments)    Causes shaking  . Tylenol [Acetaminophen] Other (See Comments)    Avoids Tylenol products due to liver disease   . Penicillins Rash    Current Outpatient Prescriptions  Medication Sig Dispense Refill  . b complex vitamins tablet Take 1 tablet by mouth daily.      . calcium-vitamin D (OSCAL WITH D) 500-200 MG-UNIT per tablet Take 2 tablets by mouth daily.    . Carboxymethylcellul-Glycerin (CLEAR EYES FOR DRY EYES OP) Apply 1 drop to eye as needed (dry eye).     . famotidine (PEPCID AC) 10 MG chewable tablet Chew 10 mg by mouth daily as needed for heartburn.    Marland Kitchen  fulvestrant (FASLODEX) 250 MG/5ML injection Inject 500 mg into the muscle every 30 (thirty) days. One injection each buttock over 1-2 minutes. Warm prior to use.    Marland Kitchen glucosamine-chondroitin 500-400 MG tablet Take 1 tablet by mouth 3 (three) times daily.      Marland Kitchen ibuprofen (ADVIL,MOTRIN) 200 MG tablet Take 200 mg by mouth every 6 (six) hours as needed for fever or mild pain.     Marland Kitchen lidocaine-prilocaine (EMLA) cream Apply to affected area once (Patient taking differently: Apply 1 application topically as needed (for port). ) 30 g 3  . Multiple Vitamins-Minerals (MULTIVITAMIN WITH MINERALS) tablet Take 1 tablet by mouth daily.        . Omega-3 Fatty Acids (FISH OIL) 500 MG CAPS Take 1,000 mg by mouth daily.    . traMADol (ULTRAM) 50 MG tablet Take 1-2 tablets (50-100 mg total) by mouth every 6 (six) hours as needed (mild pain). 50 tablet 0   No current facility-administered medications for this visit.       Physical Exam: BP 125/73 mmHg  Pulse 71  Resp 16  Ht '5\' 6"'$  (1.676 m)  Wt 135 lb (61.236 kg)  BMI 21.80 kg/m2  SpO2 99%  General appearance: alert, cooperative and no distress Neurologic: intact Heart: regular rate and rhythm, S1, S2 normal, no murmur, click, rub or gallop Lungs: clear to auscultation bilaterally Abdomen: soft, non-tender; bowel sounds normal; no masses,  no organomegaly Extremities: extremities normal, atraumatic, no cyanosis or edema Wound: Right chest incision and port sites are well-healed and sutures are removed   Diagnostic Studies & Laboratory data:     Recent Radiology Findings:   Dg Chest 2 View  01/11/2016  CLINICAL DATA:  60 year old female status post right partial pneumonectomy in February for lung cancer. Subsequent encounter. EXAM: CHEST  2 VIEW COMPARISON:  12/21/2015 and earlier. FINDINGS: Interval resolved right pneumothorax. Postoperative changes to the right thorax and upper quadrant re- demonstrated. Improved lung volumes. Normal mediastinal contours. Left chest porta cath remains in place. No pleural effusion, pulmonary edema, or confluent pulmonary opacity. Stable visualized osseous structures. IMPRESSION: Interval resolved right pneumothorax. Postoperative changes to the chest and right upper quadrant with no acute findings. Electronically Signed   By: Genevie Ann M.D.   On: 01/11/2016 09:47      Recent Lab Findings: Lab Results  Component Value Date   WBC 6.4 12/18/2015   HGB 11.5* 12/18/2015   HCT 36.0 12/18/2015   PLT 371 12/18/2015   GLUCOSE 122 12/18/2015   ALT 27 12/18/2015   AST 21 12/18/2015   NA 140 12/18/2015   K 3.8 12/18/2015   CL 105 12/03/2015    CREATININE 0.8 12/18/2015   BUN 12.6 12/18/2015   CO2 23 12/18/2015   INR 1.06 11/27/2015      Assessment / Plan:   Stable following right upper lobectomy for stage IA adenocarcinoma of the lung, Follow-up chest x-rays shows complete resolution of the previous apical space. Patient increasing her physical activity appropriately, still has some fatigue and chest soreness but is improving. Plan to see her back in 3 months with a follow-up chest x-ray, and plan follow-up CT of the chest 6 months postop from her lung resection for stage I a adenocarcinoma of the lung.      Grace Isaac MD      [address] [place] [address] Office [phone]   Beeper [phone]  01/11/2016 10:19 AM

## 2016-01-15 ENCOUNTER — Ambulatory Visit (HOSPITAL_BASED_OUTPATIENT_CLINIC_OR_DEPARTMENT_OTHER): Payer: BLUE CROSS/BLUE SHIELD | Admitting: Nurse Practitioner

## 2016-01-15 ENCOUNTER — Other Ambulatory Visit: Payer: Self-pay | Admitting: *Deleted

## 2016-01-15 ENCOUNTER — Telehealth: Payer: Self-pay | Admitting: Oncology

## 2016-01-15 ENCOUNTER — Encounter: Payer: Self-pay | Admitting: Nurse Practitioner

## 2016-01-15 ENCOUNTER — Ambulatory Visit (HOSPITAL_BASED_OUTPATIENT_CLINIC_OR_DEPARTMENT_OTHER): Payer: BLUE CROSS/BLUE SHIELD

## 2016-01-15 ENCOUNTER — Other Ambulatory Visit (HOSPITAL_BASED_OUTPATIENT_CLINIC_OR_DEPARTMENT_OTHER): Payer: BLUE CROSS/BLUE SHIELD

## 2016-01-15 VITALS — BP 128/83 | HR 73 | Temp 98.3°F | Resp 18 | Ht 66.0 in | Wt 134.4 lb

## 2016-01-15 DIAGNOSIS — C50412 Malignant neoplasm of upper-outer quadrant of left female breast: Secondary | ICD-10-CM

## 2016-01-15 DIAGNOSIS — C50811 Malignant neoplasm of overlapping sites of right female breast: Secondary | ICD-10-CM

## 2016-01-15 DIAGNOSIS — C50911 Malignant neoplasm of unspecified site of right female breast: Secondary | ICD-10-CM

## 2016-01-15 DIAGNOSIS — C3411 Malignant neoplasm of upper lobe, right bronchus or lung: Secondary | ICD-10-CM

## 2016-01-15 DIAGNOSIS — Z5111 Encounter for antineoplastic chemotherapy: Secondary | ICD-10-CM

## 2016-01-15 LAB — CBC WITH DIFFERENTIAL/PLATELET
BASO%: 0.9 % (ref 0.0–2.0)
Basophils Absolute: 0.1 10*3/uL (ref 0.0–0.1)
EOS ABS: 0.2 10*3/uL (ref 0.0–0.5)
EOS%: 3.6 % (ref 0.0–7.0)
HCT: 38.8 % (ref 34.8–46.6)
HEMOGLOBIN: 12.3 g/dL (ref 11.6–15.9)
LYMPH%: 29.1 % (ref 14.0–49.7)
MCH: 26.9 pg (ref 25.1–34.0)
MCHC: 31.8 g/dL (ref 31.5–36.0)
MCV: 84.8 fL (ref 79.5–101.0)
MONO#: 0.3 10*3/uL (ref 0.1–0.9)
MONO%: 5.6 % (ref 0.0–14.0)
NEUT%: 60.8 % (ref 38.4–76.8)
NEUTROS ABS: 3.6 10*3/uL (ref 1.5–6.5)
Platelets: 251 10*3/uL (ref 145–400)
RBC: 4.58 10*6/uL (ref 3.70–5.45)
RDW: 14.1 % (ref 11.2–14.5)
WBC: 5.9 10*3/uL (ref 3.9–10.3)
lymph#: 1.7 10*3/uL (ref 0.9–3.3)

## 2016-01-15 LAB — COMPREHENSIVE METABOLIC PANEL
ALBUMIN: 3.9 g/dL (ref 3.5–5.0)
ALK PHOS: 136 U/L (ref 40–150)
ALT: 25 U/L (ref 0–55)
AST: 17 U/L (ref 5–34)
Anion Gap: 9 mEq/L (ref 3–11)
BUN: 12.9 mg/dL (ref 7.0–26.0)
CO2: 26 meq/L (ref 22–29)
CREATININE: 0.8 mg/dL (ref 0.6–1.1)
Calcium: 9.6 mg/dL (ref 8.4–10.4)
Chloride: 105 mEq/L (ref 98–109)
EGFR: 80 mL/min/{1.73_m2} — ABNORMAL LOW (ref >90)
GLUCOSE: 94 mg/dL (ref 70–140)
Potassium: 4.1 mEq/L (ref 3.5–5.1)
SODIUM: 140 meq/L (ref 136–145)
TOTAL PROTEIN: 6.8 g/dL (ref 6.4–8.3)

## 2016-01-15 MED ORDER — FULVESTRANT 250 MG/5ML IM SOLN
500.0000 mg | Freq: Once | INTRAMUSCULAR | Status: AC
  Administered 2016-01-15: 500 mg via INTRAMUSCULAR
  Filled 2016-01-15: qty 10

## 2016-01-15 NOTE — Telephone Encounter (Signed)
appt made and avs printed °

## 2016-01-15 NOTE — Progress Notes (Signed)
ID: Crystal [name]   DOB: 07-11-1956  MR#: [id number]  EOF#:[id number]  PCP: Kandice Hams, MD GYN: SU: [name] MD OTHER MD: Earle Gell, Daneen Schick, Arnoldo Hooker Thimmappa]  CHIEF COMPLAINT: recurrent estrogen receptor positive breast cancer in previously irradiated breast; pT1a lung cancer  CURRENT TREATMENT: Fulvestrant  BREAST CANCER HISTORY:   From the original intake note:  The patient had bilateral diagnostic mammography 02/12/2011.  She has a history of cystic changes noted on prior studies in April 2011 and April 2010.  On the Feb 12, 2011 study there were new calcifications identified superiorly in the right breast, without any obvious mass.  The patient was brought back for further studies, 02/19/2011 and Dr. Marcelo Baldy was able to localize the calcifications under stereotactic guidance.  The biopsy ([id number]) showed high-grade ductal carcinoma with papillary features which was 70% estrogen receptor positive, 0% progesterone receptor positive.  There was no definitive invasion and therefore further studies were not performed.   Bilateral breast MRIs were obtained Feb 26, 2011.  This showed marked nodular enhancement throughout both breasts, and in the upper central portion there was a post biopsy area of change measuring up to 3.7 cm.  Most of this is a fluid cavity with a thin rim of enhancement.  There were no suspicious areas in the left breast.  No internal mammary or axillary lymph nodes.  Of course, the patient has a history of hepatic adenomas and these were noted incidentally on the MRI.   Her definitive surgical treatment and subsequent history are as detailed below.  INTERVAL HISTORY: Crystal [name] returns today for follow up of her breast and lung cancers. She is due for her next fulvestrant injection. [name] month she states the injection was given quickly, around 10 seconds, and this ended up causing painful knots to the injection sites. She was on a heating pad for a few  days. The fulvestrant itself causes her hot flashes, but she deals with them on her own. She denies vaginal changes.   REVIEW OF SYSTEMS: Sandy's pain is better since her lobectomy. She is down to just 108m tramadol at night with ibuprofen for the rest of the day. She has some chest wall tightness, and believes she is developing scar tissue, which she claims she is prone to do. Luckily her heartburn is better. She has only needed priolsec twice in the past 2 weeks. She is becoming more mobile. She walks 4 miles frequently, and is sure to achieve 10,000 steps daily. She denies fevers, chills, nausea, vomiting, or changes in bowel or bladder habits. She is mildly short of breath while climbing stairs, but denies chest pain, cough, or palpitations. A detailed review of systems is otherwise stable.  PAST MEDICAL HISTORY: Past Medical History  Diagnosis Date  . History of small bowel obstruction   . Liver masses   . Insomnia   . GERD (gastroesophageal reflux disease)   . Complication of anesthesia     hard to wake up  . Cancer ([facility]     right breast  . Anxiety   history of small-bowel obstruction with partial colectomy March 2001.  The patient has a history of hepatic adenomas.  She has had liver resections x2.  [facility]  She has also had incisional hernia repair.  She has a history of osteopenia, and she has a history of multiple anti-red cell antibodies making a transfusion difficult.  The patient tells me  that she has a history of low calcium and a tendency to get liver injections.  PAST SURGICAL HISTORY: Past Surgical History  Procedure Laterality Date  . Liver surgery    . Laparotomies    . Hernia repair      incisional hernia repair x3  . Breast surgery Right   . Mastectomy w/ sentinel node biopsy Right 07/05/2015    Procedure: RIGHT MASTECTOMY WITH SENTINEL NODE BIOPSY AND PORT A CATH INSERTION;  Surgeon: [name], MD;  Location:  [facility];  Service: General;  Laterality: Right;  . Portacath placement Left 07/05/2015    Procedure: INSERTION PORT-A-CATH;  Surgeon: [name], MD;  Location: [facility];  Service: General;  Laterality: Left;  . Video assisted thoracoscopy (vats)/wedge resection Right 11/29/2015    Procedure: BRONCHOSCOPY , VIDEO ASSISTED THORACOSCOPY (VATS)/WEDGE RESECTION;  Surgeon: Grace Isaac, MD;  Location: [facility];  Service: Thoracic;  Laterality: Right;  . Lobectomy Right 11/29/2015    Procedure: Completion Right Upper  LOBECTOMY, Lymph node dissection, on Q placement;  Surgeon: Grace Isaac, MD;  Location: [place];  Service: Thoracic;  Laterality: Right;    FAMILY HISTORY Family History  Problem Relation Age of Onset  . Ovarian cancer Mother 67    "metastasized to breasts"  . Breast cancer Maternal Grandmother 48    2-3 recurrences  . Stomach cancer Maternal Grandfather     dx. 47-49; metastatic stomach cancer  . Colon polyps Father     about 2-3 polyps removed at each colonoscopy  . Colon polyps Brother     "a few"  . Other Maternal Aunt     +TAH  . Colon cancer Paternal Aunt 90  . Brain cancer Daughter 17    astrocytoma  . Breast cancer Cousin     dx. 59s  The patient's mother died from ovarian cancer at the age of 26.  The patient's father is alive at age 26.  She has 2 brothers, no sisters.  A maternal grandmother was diagnosed with breast cancer at the age of 57, and the maternal grandfather had stomach cancer at the age of 4.  GYNECOLOGIC HISTORY: The patient had menarche at age 41.  Her [name] menstrual period was in 2005.  She never used hormone replacement.  She used birth control briefly to control bleeding prior to 1980.  She was 60 years old at the birth of her first child.  She started having mammograms at age 16 because of a strong family history.  SOCIAL HISTORY: Crystal [name] works as a Programmer, applications for [employer].   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  Son, Crystal [name], lives in [place] and works for that [employer]  Daughter, [name], lives in [place] and is a homemaker.   ADVANCED DIRECTIVES: in place  HEALTH MAINTENANCE: Social History  Substance Use Topics  . Smoking status: Never Smoker   . Smokeless tobacco: Never Used  . Alcohol Use: 0.6 oz/week    1 Glasses of wine per week     Comment: social     Colonoscopy:  PAP:  Bone density:  Lipid panel:  Allergies  Allergen Reactions  . Promethazine Hcl Other (See Comments)    Causes shaking  . Tylenol [Acetaminophen] Other (See Comments)    Avoids Tylenol products due to liver disease   . Penicillins Rash  Current Outpatient Prescriptions  Medication Sig Dispense Refill  . b complex vitamins tablet Take 1 tablet by mouth daily.      . calcium-vitamin D (OSCAL WITH D) 500-200 MG-UNIT per tablet Take 2 tablets by mouth daily.    . Carboxymethylcellul-Glycerin (CLEAR EYES FOR DRY EYES OP) Apply 1 drop to eye as needed (dry eye).     . famotidine (PEPCID AC) 10 MG chewable tablet Chew 10 mg by mouth daily as needed for heartburn.    . fulvestrant (FASLODEX) 250 MG/5ML injection Inject 500 mg into the muscle every 30 (thirty) days. One injection each buttock over 1-2 minutes. Warm prior to use.    Marland Kitchen glucosamine-chondroitin 500-400 MG tablet Take 1 tablet by mouth 3 (three) times daily.      Marland Kitchen ibuprofen (ADVIL,MOTRIN) 200 MG tablet Take 200 mg by mouth every 6 (six) hours as needed for fever or mild pain.     Marland Kitchen lidocaine-prilocaine (EMLA) cream Apply to affected area once (Patient taking differently: Apply 1 application topically as needed (for port). ) 30 g 3  . Multiple Vitamins-Minerals (MULTIVITAMIN WITH MINERALS) tablet Take 1 tablet by mouth daily.      . Omega-3 Fatty Acids (FISH OIL) 500 MG CAPS Take 1,000 mg by mouth  daily.    . traMADol (ULTRAM) 50 MG tablet Take 1-2 tablets (50-100 mg total) by mouth every 6 (six) hours as needed (mild pain). (Patient taking differently: Take 50-100 mg by mouth every 6 (six) hours as needed (mild pain). Pt takes 1/2 of tablet, 25 mg at night) 50 tablet 0   No current facility-administered medications for this visit.    OBJECTIVE: Middle-aged white woman  Filed Vitals:   01/15/16 0822  BP: 128/83  Pulse: 73  Temp: 98.3 F (36.8 C)  Resp: 18     Body mass index is 21.7 kg/(m^2).    ECOG FS: 1  Skin: warm, dry  HEENT: sclerae anicteric, conjunctivae pink, oropharynx clear. No thrush or mucositis.  Lymph Nodes: No cervical or supraclavicular lymphadenopathy  Lungs: clear to auscultation bilaterally, no rales, wheezes, or rhonci  Heart: regular rate and rhythm  Abdomen: round, soft, non tender, positive bowel sounds  Musculoskeletal: No focal spinal tenderness, no peripheral edema, right flank status post lobectomy. Incision line clean dry and intact  Neuro: non focal, well oriented, positive affect  Breasts: right breast status post mastectomy. No evidence of recurrent disease. Right axilla benign. Left breast unremarkable.  LAB RESULTS: Lab Results  Component Value Date   WBC 6.4 12/18/2015   NEUTROABS 4.2 12/18/2015   HGB 11.5* 12/18/2015   HCT 36.0 12/18/2015   MCV 86.9 12/18/2015   PLT 371 12/18/2015      Chemistry      Component Value Date/Time   NA 140 12/18/2015 0816   NA 141 12/03/2015 0430   K 3.8 12/18/2015 0816   K 3.1* 12/03/2015 0430   CL 105 12/03/2015 0430   CL 102 03/15/2013 0912   CO2 23 12/18/2015 0816   CO2 29 12/03/2015 0430   BUN 12.6 12/18/2015 0816   BUN <5* 12/03/2015 0430   CREATININE 0.8 12/18/2015 0816   CREATININE 0.52 12/03/2015 0430      Component Value Date/Time   CALCIUM 9.5 12/18/2015 0816   CALCIUM 8.3* 12/03/2015 0430   ALKPHOS 139 12/18/2015 0816   ALKPHOS 82 12/01/2015 0415   AST 21 12/18/2015 0816    AST 25 12/01/2015 0415   ALT 27 12/18/2015 0816  ALT 22 12/01/2015 0415   BILITOT <0.30 12/18/2015 0816   BILITOT 0.5 12/01/2015 0415       Lab Results  Component Value Date   LABCA2 28 10/20/2012    No components found for: [id number]  No results for input(s): INR in the [name] 168 hours.  Urinalysis    Component Value Date/Time   COLORURINE YELLOW 11/27/2015 1403    STUDIES: Dg Chest 2 View  01/11/2016  CLINICAL DATA:  60 year old female status post right partial pneumonectomy in February for lung cancer. Subsequent encounter. EXAM: CHEST  2 VIEW COMPARISON:  12/21/2015 and earlier. FINDINGS: Interval resolved right pneumothorax. Postoperative changes to the right thorax and upper quadrant re- demonstrated. Improved lung volumes. Normal mediastinal contours. Left chest porta cath remains in place. No pleural effusion, pulmonary edema, or confluent pulmonary opacity. Stable visualized osseous structures. IMPRESSION: Interval resolved right pneumothorax. Postoperative changes to the chest and right upper quadrant with no acute findings. Electronically Signed   By: Genevie Ann M.D.   On: 01/11/2016 09:47   Dg Chest 2 View  12/21/2015  CLINICAL DATA:  Status post right lobectomy for lung malignancy on November 29, 2015; history of right breast malignancy, follow-up study. EXAM: CHEST  2 VIEW COMPARISON:  PA and lateral chest x-ray of December 04, 2015 FINDINGS: There is at approximately 20% right-sided pneumothorax. This has increased in size since the previous study. Blunting of the right lateral costophrenic angle persists. There is a small amount of subcutaneous emphysema over the right lower lateral axillary region. The left lung is clear. The heart and pulmonary vascularity are normal. The Port-A-Cath appliance tip projects over the proximal SVC. The bony thorax exhibits no acute abnormality. IMPRESSION: The previously demonstrated small right apical pneumothorax has increased in size and  occupies approximately 20% of the lung volume. There may be a trace of pleural fluid on the right. There is no mediastinal shift. Critical Value/emergent results were called by telephone at the time of interpretation on 12/21/2015 at 9:07 am to Elnita Maxwell, RN, , who verbally acknowledged these results. Electronically Signed   By: David  Martinique M.D.   On: 12/21/2015 09:11    ASSESSMENT: 60 y.o.  BRCA 1-2 and p-53 negative [place] woman status post right lumpectomy and sentinel lymph node sampling June 2012 for a T1a N0, Stage IA  invasive ductal carcinoma, grade 3, estrogen receptor 98% positive, progesterone receptor 97% positive, with an MIB-1 of 5% and no HER-2 amplification   (a) note that the initial Right breast biopsy showed DCIS w papillary architecture, estrogen receptor 70% positive, progesterone receptor negative  (1) completed radiation September 2012.   (2) on letrozole starting July 2012, discontinued August 2016 with recurrence  RECURRENT DISEASE: (3) right breast upper outer quadrant biopsy 05/23/2015 was positive for a clinical T1c N0, stage IA invasive ductal carcinoma, grade 2, estrogen receptor 60% positive with moderate staining intensity, progesterone receptor negative, with no HER-2 amplification and an MIB-1 of 90%.  (4) status post right simple mastectomy 07/05/2015 for a pT1c pN0, stage IA invasive ductal carcinoma, grade 3, with negative margins. 4 lymph nodes removed with mastectomy were benign.  (a) the patient is not planning on reconstruction  (5) began cyclophosphamide and docetaxel 07/26/2015, repeated every 21 days 4, final dose 09/26/2015  (6) genetics testing 07/20/2015 was normal and did not reveal a deleterious mutation in ATM, BARD1, BRCA1, BRCA2, BRIP1, CDH1, CHEK2, FANCC, MLH1, MSH2, MSH6, NBN, PALB2, PMS2, PTEN, RAD51C, RAD51D, TP53, and XRCC2. Additionally  no variant of uncertain significance were found.   (7) RUL spiculated nodule measuring 1.4 cm  noted on staging CT scan 06/13/2015-- stable on repeat CT 07/25/15--moderately hot and slightly larger on PET 10/31/2015  (a) status post right upper lobectomy and lymph node dissection 11/29/2015 for a pT1a pN0, stage IA adenocarcinoma, low-grade, with negative margins   (8) history of multiple hepatic adenomas  (9) fulvestrant started 10/23/2015  PLAN: Rosalee is recovering well with no issues. She will continue to manage her pain with tramadol and ibuprofen alone. She believes her fulvestrant injections are being given too quickly. The manufacturer suggests an administration time of 1-2 minutes. I will speak with the nursing manager to ensure that our injection nurses are reeducated regarding this drug.   I have sent a message to Dr. Rush Farmer to arrange for her port to be removed.   Jeryn will return in 1 month with her next fulvestrant injection. She will have a follow up visit with Dr. Jana Hakim. She understands and agrees with this plan. She has been encouraged to call with any issues that might arise before her next visit here.    Genelle Gather Boelter    01/15/2016

## 2016-01-16 ENCOUNTER — Other Ambulatory Visit: Payer: Self-pay | Admitting: Surgery

## 2016-01-18 ENCOUNTER — Other Ambulatory Visit: Payer: Self-pay | Admitting: Surgery

## 2016-01-18 DIAGNOSIS — Z853 Personal history of malignant neoplasm of breast: Secondary | ICD-10-CM | POA: Diagnosis not present

## 2016-01-25 ENCOUNTER — Encounter: Payer: BLUE CROSS/BLUE SHIELD | Admitting: Cardiothoracic Surgery

## 2016-02-09 ENCOUNTER — Other Ambulatory Visit: Payer: Self-pay

## 2016-02-09 DIAGNOSIS — C50811 Malignant neoplasm of overlapping sites of right female breast: Secondary | ICD-10-CM

## 2016-02-11 NOTE — Progress Notes (Signed)
ID: Crystal [name]   DOB: May 24, 1956  MR#: [id number]  SWF#:[id number]  PCP: Crystal Hams, MD GYN: SU: Crystal Keens MD OTHER MD: Crystal [name], Crystal [name], Crystal [name]]  CHIEF COMPLAINT: recurrent estrogen receptor positive breast cancer in previously irradiated breast; pT1a lung cancer  CURRENT TREATMENT: Fulvestrant  BREAST CANCER HISTORY:   From the original intake note:  The patient had bilateral diagnostic mammography 02/12/2011.  She has a history of cystic changes noted on prior studies in April 2011 and April 2010.  On the Feb 12, 2011 study there were new calcifications identified superiorly in the right breast, without any obvious mass.  The patient was brought back for further studies, 02/19/2011 and Dr. Marcelo [name] was able to localize the calcifications under stereotactic guidance.  The biopsy ([id number]) showed high-grade ductal carcinoma with papillary features which was 70% estrogen receptor positive, 0% progesterone receptor positive.  There was no definitive invasion and therefore further studies were not performed.   Bilateral breast MRIs were obtained Feb 26, 2011.  This showed marked nodular enhancement throughout both breasts, and in the upper central portion there was a post biopsy area of change measuring up to 3.7 cm.  Most of this is a fluid cavity with a thin rim of enhancement.  There were no suspicious areas in the left breast.  No internal mammary or axillary lymph nodes.  Of course, the patient has a history of hepatic adenomas and these were noted incidentally on the MRI.   Her definitive surgical treatment and subsequent history are as detailed below.  INTERVAL HISTORY: Crystal [name] returns today for follow up of her estrogen receptor positive breast cancer and her early stage lung cancer. She continues on fulvestrant, which she tolerates generally well. She remains anxious that the injection not be given to quickly because that does increase the pain. We  have discussed this with nursing.  REVIEW OF SYSTEMS: Crystal [name] had a 2 or 3 week period off significant cough and sore throat, with no fever, no mouth sores, no diarrhea, and no rash or body aches. That has subsided but she still has a little bit of a dry cough and when she coughs her scar from the lung surgery hurts more. She sleeps poorly. She describes herself is moderately fatigued. The pain is chiefly at the lung surgical site and it is stabbing and intermittent. She is having some dental sensitivity. Hot flashes are moderate. The occur more right after a meal. For the pain on her back she uses tramadol, very minimally, 25 mg at bedtime. She uses ibuprofen rarely. A detailed review of systems today was otherwise stable  PAST MEDICAL HISTORY: Past Medical History  Diagnosis Date  . History of small bowel obstruction   . Liver masses   . Insomnia   . GERD (gastroesophageal reflux disease)   . Complication of anesthesia     hard to wake up  . Cancer [facility])     right breast  . Anxiety   history of small-bowel obstruction with partial colectomy March 2001.  The patient has a history of hepatic adenomas.  She has had liver resections x2.  The data for this is at [facility] and we will try to obtain it.  She has also had incisional hernia repair.  She has a history of osteopenia, and she has a history of multiple anti-red cell antibodies making a transfusion difficult.  The patient tells me that she has a history of low calcium and a tendency to get liver injections.  PAST SURGICAL HISTORY: Past Surgical History  Procedure Laterality Date  . Liver surgery    . Laparotomies    . Hernia repair      incisional hernia repair x3  . Breast surgery Right   . Mastectomy w/ sentinel node biopsy Right 07/05/2015    Procedure: RIGHT MASTECTOMY WITH SENTINEL NODE BIOPSY AND PORT A CATH INSERTION;  Surgeon: Crystal Keens, MD;  Location: [facility];  Service: General;  Laterality: Right;  .  Portacath placement Left 07/05/2015    Procedure: INSERTION PORT-A-CATH;  Surgeon: Crystal Keens, MD;  Location: [place];  Service: General;  Laterality: Left;  . Video assisted thoracoscopy (vats)/wedge resection Right 11/29/2015    Procedure: BRONCHOSCOPY , VIDEO ASSISTED THORACOSCOPY (VATS)/WEDGE RESECTION;  Surgeon: Crystal Isaac, MD;  Location: [facility];  Service: Thoracic;  Laterality: Right;  . Lobectomy Right 11/29/2015    Procedure: Completion Right Upper  LOBECTOMY, Lymph node dissection, on Q placement;  Surgeon: Crystal Isaac, MD;  Location: [facility];  Service: Thoracic;  Laterality: Right;    FAMILY HISTORY Family History  Problem Relation Age of Onset  . Ovarian cancer Mother 91    "metastasized to breasts"  . Breast cancer Maternal Grandmother 48    2-3 recurrences  . Stomach cancer Maternal Grandfather     dx. 47-49; metastatic stomach cancer  . Colon polyps Father     about 2-3 polyps removed at each colonoscopy  . Colon polyps Brother     "a few"  . Other Maternal Aunt     +TAH  . Colon cancer Paternal Aunt 90  . Brain cancer Daughter 17    astrocytoma  . Breast cancer Cousin     dx. 43s  The patient's mother died from ovarian cancer at the age of 53.  The patient's father is alive at age 37.  She has 2 brothers, no sisters.  A maternal grandmother was diagnosed with breast cancer at the age of 24, and the maternal grandfather had stomach cancer at the age of 56.  GYNECOLOGIC HISTORY: The patient had menarche at age 14.  Her [name] menstrual period was in 2005.  She never used hormone replacement.  She used birth control briefly to control bleeding prior to 1980.  She was 60 years old at the birth of her first child.  She started having mammograms at age 62 because of a strong family history.  SOCIAL HISTORY: Crystal [name] works as a Programmer, applications for [employer].   She also heads the local "[employer]" Her  husband, Crystal [name], is a retired Engineer, maintenance from a [employer] locally.  Son, Crystal [name], lives in [place] and works for that [employer]  Daughter, Crystal [name], lives in [place] and is a homemaker.   ADVANCED DIRECTIVES: in place  HEALTH MAINTENANCE: Social History  Substance Use Topics  . Smoking status: Never Smoker   . Smokeless tobacco: Never Used  . Alcohol Use: 0.6 oz/week    1 Glasses of wine per week     Comment: social     Colonoscopy:  PAP:  Bone density:  Lipid panel:  Allergies  Allergen Reactions  . Promethazine Hcl Other (See Comments)    Causes shaking  . Tylenol [Acetaminophen] Other (See Comments)    Avoids Tylenol products due to liver disease   . Penicillins Rash    Current Outpatient Prescriptions  Medication Sig Dispense Refill  . Turmeric 500 MG TABS  Take 1,000 mg by mouth daily.    Marland Kitchen b complex vitamins tablet Take 1 tablet by mouth daily.      . calcium-vitamin D (OSCAL WITH D) 500-200 MG-UNIT per tablet Take 2 tablets by mouth daily.    . famotidine (PEPCID AC) 10 MG chewable tablet Chew 10 mg by mouth daily as needed for heartburn.    . fulvestrant (FASLODEX) 250 MG/5ML injection Inject 500 mg into the muscle every 30 (thirty) days. One injection each buttock over 1-2 minutes. Warm prior to use.    Marland Kitchen glucosamine-chondroitin 500-400 MG tablet Take 1 tablet by mouth 3 (three) times daily.      Marland Kitchen ibuprofen (ADVIL,MOTRIN) 200 MG tablet Take 200 mg by mouth every 6 (six) hours as needed for fever or mild pain.     . Multiple Vitamins-Minerals (MULTIVITAMIN WITH MINERALS) tablet Take 1 tablet by mouth daily.      . Omega-3 Fatty Acids (FISH OIL) 500 MG CAPS Take 1,000 mg by mouth daily.    . traMADol (ULTRAM) 50 MG tablet Take 0.5-1 tablets (25-50 mg total) by mouth every 12 (twelve) hours as needed (mild pain). 50 tablet 0   No current facility-administered medications for this visit.    OBJECTIVE: Middle-aged white woman Who appears stated  age 60 Vitals:   02/12/16 0854  BP: 114/55  Pulse: 75  Temp: 98.6 F (37 C)  Resp: 18     Body mass index is 21.95 kg/(m^2).    ECOG FS: 1  Sclerae unicteric, pupils round and equal Oropharynx clear and moist-- no thrush or other lesions No cervical or supraclavicular adenopathy Lungs no rales or rhonchi Heart regular rate and rhythm Abd soft, nontender, positive bowel sounds MSK no focal spinal tenderness, no upper extremity lymphedema; the right posterior chest wall incision is healing very nicely, there is no tenderness, erythema, or swelling associated with Neuro: nonfocal, well oriented, appropriate affect Breasts: The right breast is status post mastectomy. There is no evidence of chest wall recurrence. In the axilla there is what appears to be a small fairly tight fluid collection. I do not think it represents muscle tissue. The left breast is unremarkable    LAB RESULTS: Lab Results  Component Value Date   WBC 6.1 02/12/2016   NEUTROABS 3.9 02/12/2016   HGB 12.5 02/12/2016   HCT 38.9 02/12/2016   MCV 84.4 02/12/2016   PLT 238 02/12/2016      Chemistry      Component Value Date/Time   NA 140 02/12/2016 0833   NA 141 12/03/2015 0430   K 4.0 02/12/2016 0833   K 3.1* 12/03/2015 0430   CL 105 12/03/2015 0430   CL 102 03/15/2013 0912   CO2 26 02/12/2016 0833   CO2 29 12/03/2015 0430   BUN 16.7 02/12/2016 0833   BUN <5* 12/03/2015 0430   CREATININE 0.8 02/12/2016 0833   CREATININE 0.52 12/03/2015 0430      Component Value Date/Time   CALCIUM 9.4 02/12/2016 0833   CALCIUM 8.3* 12/03/2015 0430   ALKPHOS 146 02/12/2016 0833   ALKPHOS 82 12/01/2015 0415   AST 20 02/12/2016 0833   AST 25 12/01/2015 0415   ALT 27 02/12/2016 0833   ALT 22 12/01/2015 0415   BILITOT <0.30 02/12/2016 0833   BILITOT 0.5 12/01/2015 0415       Lab Results  Component Value Date   LABCA2 28 10/20/2012    No components found for: [id number]  No results for input(s): INR  in the  [name] 168 hours.  Urinalysis    Component Value Date/Time   COLORURINE YELLOW 11/27/2015 1403    STUDIES: No results found.  ASSESSMENT: 60 y.o.  BRCA 1-2 and p-53 negative [place] woman status post right lumpectomy and sentinel lymph node sampling June 2012 for a T1a N0, Stage IA  invasive ductal carcinoma, grade 3, estrogen receptor 98% positive, progesterone receptor 97% positive, with an MIB-1 of 5% and no HER-2 amplification   (a) note that the initial Right breast biopsy showed DCIS w papillary architecture, estrogen receptor 70% positive, progesterone receptor negative  (1) completed radiation September 2012.   (2) on letrozole starting July 2012, discontinued August 2016 with recurrence  RECURRENT DISEASE: (3) right breast upper outer quadrant biopsy 05/23/2015 was positive for a clinical T1c N0, stage IA invasive ductal carcinoma, grade 2, estrogen receptor 60% positive with moderate staining intensity, progesterone receptor negative, with no HER-2 amplification and an MIB-1 of 90%.  (4) status post right simple mastectomy 07/05/2015 for a pT1c pN0, stage IA invasive ductal carcinoma, grade 3, with negative margins. 4 lymph nodes removed with mastectomy were benign.  (a) the patient is not planning on reconstruction  (5) began cyclophosphamide and docetaxel 07/26/2015, repeated every 21 days 4, final dose 09/26/2015  (6) genetics testing 07/20/2015 was normal and did not reveal a deleterious mutation in ATM, BARD1, BRCA1, BRCA2, BRIP1, CDH1, CHEK2, FANCC, MLH1, MSH2, MSH6, NBN, PALB2, PMS2, PTEN, RAD51C, RAD51D, TP53, and XRCC2. Additionally no variant of uncertain significance were found.   (7) RUL spiculated nodule measuring 1.4 cm noted on staging CT scan 06/13/2015-- stable on repeat CT 07/25/15--moderately hot and slightly larger on PET 10/31/2015  (a) status post right upper lobectomy and lymph node dissection 11/29/2015 for a pT1a pN0, stage IA adenocarcinoma,  low-grade, with negative margins   (8) history of multiple hepatic adenomas  (9) fulvestrant started 10/23/2015  PLAN: Crystal [name] is tolerating the fulvestrant well and the plan will be to continue that indefinitely until there is evidence of disease progression.  As far as the pain is concerned, she is minimizing her use of tramadol and using nonsteroidals appropriately. I think she would benefit from Lidoderm. We discussed how to use that today and she will give it a try. I went ahead and placed the order in for her.  She is going to try Sensodyne for her dental sensitivity. As far as his hot flashes are concerned we discussed venlafaxine. At this point she would prefer to "hold off". In general she prefers to use as little medication as possible.  She is walking indoors because of allergy issues and her cough. Today I gave her information on the [employer] program. I also told her about the [facility] program. Once the pollen is a little less of think she would be a good candidate for that.  She will have her port removed in about 2 weeks. I think she can mention to Dr. Ninfa Linden that if that slight area of swelling in the right axilla could be drained, it might make her little bit more comfortable. Until then she will wear a different type of bra.  I'm setting her up for a bone scan in the near future and we will call her with those results. Otherwise we will see her again with her next fulvestrant treatment just to make sure her pain is better controlled. She will be seeing Dr. Servando Snare in June. I understand he will be obtaining a chest x-ray at  that point and then a CT of the chest likely in September.  [place] for any problems that may develop before her next visit here.   MAGRINAT,GUSTAV C    02/12/2016

## 2016-02-12 ENCOUNTER — Telehealth: Payer: Self-pay | Admitting: Oncology

## 2016-02-12 ENCOUNTER — Ambulatory Visit (HOSPITAL_BASED_OUTPATIENT_CLINIC_OR_DEPARTMENT_OTHER): Payer: BLUE CROSS/BLUE SHIELD

## 2016-02-12 ENCOUNTER — Other Ambulatory Visit (HOSPITAL_BASED_OUTPATIENT_CLINIC_OR_DEPARTMENT_OTHER): Payer: BLUE CROSS/BLUE SHIELD

## 2016-02-12 ENCOUNTER — Ambulatory Visit (HOSPITAL_BASED_OUTPATIENT_CLINIC_OR_DEPARTMENT_OTHER): Payer: BLUE CROSS/BLUE SHIELD | Admitting: Oncology

## 2016-02-12 VITALS — BP 114/55 | HR 75 | Temp 98.6°F | Resp 18 | Ht 66.0 in | Wt 135.9 lb

## 2016-02-12 DIAGNOSIS — C50811 Malignant neoplasm of overlapping sites of right female breast: Secondary | ICD-10-CM

## 2016-02-12 DIAGNOSIS — R05 Cough: Secondary | ICD-10-CM

## 2016-02-12 DIAGNOSIS — C50911 Malignant neoplasm of unspecified site of right female breast: Secondary | ICD-10-CM

## 2016-02-12 DIAGNOSIS — C50412 Malignant neoplasm of upper-outer quadrant of left female breast: Secondary | ICD-10-CM | POA: Diagnosis not present

## 2016-02-12 DIAGNOSIS — C3411 Malignant neoplasm of upper lobe, right bronchus or lung: Secondary | ICD-10-CM | POA: Diagnosis not present

## 2016-02-12 DIAGNOSIS — R0789 Other chest pain: Secondary | ICD-10-CM | POA: Diagnosis not present

## 2016-02-12 DIAGNOSIS — Z5111 Encounter for antineoplastic chemotherapy: Secondary | ICD-10-CM

## 2016-02-12 DIAGNOSIS — M545 Low back pain: Secondary | ICD-10-CM

## 2016-02-12 DIAGNOSIS — N951 Menopausal and female climacteric states: Secondary | ICD-10-CM

## 2016-02-12 DIAGNOSIS — G8918 Other acute postprocedural pain: Secondary | ICD-10-CM

## 2016-02-12 LAB — CBC WITH DIFFERENTIAL/PLATELET
BASO%: 0.5 % (ref 0.0–2.0)
BASOS ABS: 0 10*3/uL (ref 0.0–0.1)
EOS ABS: 0.3 10*3/uL (ref 0.0–0.5)
EOS%: 5.2 % (ref 0.0–7.0)
HEMATOCRIT: 38.9 % (ref 34.8–46.6)
HEMOGLOBIN: 12.5 g/dL (ref 11.6–15.9)
LYMPH#: 1.5 10*3/uL (ref 0.9–3.3)
LYMPH%: 24.7 % (ref 14.0–49.7)
MCH: 27.1 pg (ref 25.1–34.0)
MCHC: 32.1 g/dL (ref 31.5–36.0)
MCV: 84.4 fL (ref 79.5–101.0)
MONO#: 0.3 10*3/uL (ref 0.1–0.9)
MONO%: 5.4 % (ref 0.0–14.0)
NEUT#: 3.9 10*3/uL (ref 1.5–6.5)
NEUT%: 64.2 % (ref 38.4–76.8)
PLATELETS: 238 10*3/uL (ref 145–400)
RBC: 4.61 10*6/uL (ref 3.70–5.45)
RDW: 14.6 % — ABNORMAL HIGH (ref 11.2–14.5)
WBC: 6.1 10*3/uL (ref 3.9–10.3)
nRBC: 0 % (ref 0–0)

## 2016-02-12 LAB — COMPREHENSIVE METABOLIC PANEL
ALBUMIN: 3.8 g/dL (ref 3.5–5.0)
ALK PHOS: 146 U/L (ref 40–150)
ALT: 27 U/L (ref 0–55)
AST: 20 U/L (ref 5–34)
Anion Gap: 8 mEq/L (ref 3–11)
BUN: 16.7 mg/dL (ref 7.0–26.0)
CALCIUM: 9.4 mg/dL (ref 8.4–10.4)
CO2: 26 mEq/L (ref 22–29)
CREATININE: 0.8 mg/dL (ref 0.6–1.1)
Chloride: 106 mEq/L (ref 98–109)
EGFR: 82 mL/min/{1.73_m2} — AB (ref >90)
GLUCOSE: 82 mg/dL (ref 70–140)
Potassium: 4 mEq/L (ref 3.5–5.1)
Sodium: 140 mEq/L (ref 136–145)
Total Protein: 7 g/dL (ref 6.4–8.3)

## 2016-02-12 MED ORDER — FULVESTRANT 250 MG/5ML IM SOLN
500.0000 mg | Freq: Once | INTRAMUSCULAR | Status: AC
  Administered 2016-02-12: 500 mg via INTRAMUSCULAR
  Filled 2016-02-12: qty 10

## 2016-02-12 MED ORDER — TRAMADOL HCL 50 MG PO TABS: 25.0000 mg | ORAL_TABLET | Freq: Two times a day (BID) | ORAL | Status: DC | PRN

## 2016-02-12 MED ORDER — LIDOCAINE 5 % EX PTCH: 1.0000 | MEDICATED_PATCH | CUTANEOUS | Status: DC

## 2016-02-12 NOTE — Patient Instructions (Signed)

## 2016-02-12 NOTE — Telephone Encounter (Signed)
appt made and avs printed °

## 2016-02-13 ENCOUNTER — Other Ambulatory Visit: Payer: Self-pay | Admitting: *Deleted

## 2016-02-13 ENCOUNTER — Encounter: Payer: Self-pay | Admitting: Oncology

## 2016-02-13 NOTE — Progress Notes (Signed)
Lidocaine patch sent to [facility] for prior auth

## 2016-02-20 DIAGNOSIS — C50911 Malignant neoplasm of unspecified site of right female breast: Secondary | ICD-10-CM | POA: Diagnosis not present

## 2016-02-21 ENCOUNTER — Ambulatory Visit (HOSPITAL_COMMUNITY)
Admission: RE | Admit: 2016-02-21 | Discharge: 2016-02-21 | Disposition: A | Discharge status: Home / Self Care | Payer: BLUE CROSS/BLUE SHIELD | Source: Ambulatory Visit | Attending: Oncology | Admitting: Oncology

## 2016-02-21 ENCOUNTER — Encounter (HOSPITAL_COMMUNITY)
Admission: RE | Admit: 2016-02-21 | Discharge: 2016-02-21 | Disposition: A | Discharge status: Home / Self Care | Payer: BLUE CROSS/BLUE SHIELD | Source: Ambulatory Visit | Attending: Oncology | Admitting: Oncology

## 2016-02-21 DIAGNOSIS — C50911 Malignant neoplasm of unspecified site of right female breast: Secondary | ICD-10-CM | POA: Insufficient documentation

## 2016-02-21 DIAGNOSIS — G8918 Other acute postprocedural pain: Secondary | ICD-10-CM | POA: Diagnosis not present

## 2016-02-21 DIAGNOSIS — C3411 Malignant neoplasm of upper lobe, right bronchus or lung: Secondary | ICD-10-CM | POA: Diagnosis not present

## 2016-02-21 DIAGNOSIS — C50811 Malignant neoplasm of overlapping sites of right female breast: Secondary | ICD-10-CM

## 2016-02-21 MED ORDER — TECHNETIUM TC 99M MEDRONATE IV KIT: 25.0000 | PACK | Freq: Once | INTRAVENOUS | Status: DC | PRN

## 2016-02-26 DIAGNOSIS — I97621 Postprocedural hematoma of a circulatory system organ or structure following other procedure: Secondary | ICD-10-CM | POA: Diagnosis not present

## 2016-02-26 DIAGNOSIS — Z452 Encounter for adjustment and management of vascular access device: Secondary | ICD-10-CM | POA: Diagnosis not present

## 2016-02-26 DIAGNOSIS — L7622 Postprocedural hemorrhage and hematoma of skin and subcutaneous tissue following other procedure: Secondary | ICD-10-CM | POA: Diagnosis not present

## 2016-03-12 ENCOUNTER — Encounter: Payer: Self-pay | Admitting: Nurse Practitioner

## 2016-03-12 ENCOUNTER — Other Ambulatory Visit (HOSPITAL_BASED_OUTPATIENT_CLINIC_OR_DEPARTMENT_OTHER): Payer: BLUE CROSS/BLUE SHIELD

## 2016-03-12 ENCOUNTER — Ambulatory Visit (HOSPITAL_BASED_OUTPATIENT_CLINIC_OR_DEPARTMENT_OTHER): Payer: BLUE CROSS/BLUE SHIELD

## 2016-03-12 ENCOUNTER — Telehealth: Payer: Self-pay | Admitting: Nurse Practitioner

## 2016-03-12 ENCOUNTER — Ambulatory Visit (HOSPITAL_BASED_OUTPATIENT_CLINIC_OR_DEPARTMENT_OTHER): Payer: BLUE CROSS/BLUE SHIELD | Admitting: Nurse Practitioner

## 2016-03-12 VITALS — BP 113/67 | HR 77 | Temp 98.5°F | Resp 18 | Wt 136.6 lb

## 2016-03-12 DIAGNOSIS — C50412 Malignant neoplasm of upper-outer quadrant of left female breast: Secondary | ICD-10-CM

## 2016-03-12 DIAGNOSIS — C3411 Malignant neoplasm of upper lobe, right bronchus or lung: Secondary | ICD-10-CM

## 2016-03-12 DIAGNOSIS — IMO0002 Reserved for concepts with insufficient information to code with codable children: Secondary | ICD-10-CM

## 2016-03-12 DIAGNOSIS — C50811 Malignant neoplasm of overlapping sites of right female breast: Secondary | ICD-10-CM

## 2016-03-12 DIAGNOSIS — C50911 Malignant neoplasm of unspecified site of right female breast: Secondary | ICD-10-CM

## 2016-03-12 DIAGNOSIS — Z5111 Encounter for antineoplastic chemotherapy: Secondary | ICD-10-CM

## 2016-03-12 DIAGNOSIS — G8918 Other acute postprocedural pain: Secondary | ICD-10-CM

## 2016-03-12 LAB — CBC WITH DIFFERENTIAL/PLATELET
BASO%: 0.8 % (ref 0.0–2.0)
BASOS ABS: 0 10*3/uL (ref 0.0–0.1)
EOS ABS: 0.1 10*3/uL (ref 0.0–0.5)
EOS%: 1.5 % (ref 0.0–7.0)
HCT: 39.6 % (ref 34.8–46.6)
HGB: 12.7 g/dL (ref 11.6–15.9)
LYMPH%: 24.8 % (ref 14.0–49.7)
MCH: 26.8 pg (ref 25.1–34.0)
MCHC: 32.2 g/dL (ref 31.5–36.0)
MCV: 83.4 fL (ref 79.5–101.0)
MONO#: 0.3 10*3/uL (ref 0.1–0.9)
MONO%: 6.1 % (ref 0.0–14.0)
NEUT%: 66.8 % (ref 38.4–76.8)
NEUTROS ABS: 3.7 10*3/uL (ref 1.5–6.5)
Platelets: 205 10*3/uL (ref 145–400)
RBC: 4.74 10*6/uL (ref 3.70–5.45)
RDW: 16 % — ABNORMAL HIGH (ref 11.2–14.5)
WBC: 5.6 10*3/uL (ref 3.9–10.3)
lymph#: 1.4 10*3/uL (ref 0.9–3.3)

## 2016-03-12 LAB — COMPREHENSIVE METABOLIC PANEL
ALT: 28 U/L (ref 0–55)
AST: 21 U/L (ref 5–34)
Albumin: 3.8 g/dL (ref 3.5–5.0)
Alkaline Phosphatase: 127 U/L (ref 40–150)
Anion Gap: 9 mEq/L (ref 3–11)
BUN: 14.7 mg/dL (ref 7.0–26.0)
CHLORIDE: 107 meq/L (ref 98–109)
CO2: 25 meq/L (ref 22–29)
Calcium: 9.5 mg/dL (ref 8.4–10.4)
Creatinine: 0.9 mg/dL (ref 0.6–1.1)
EGFR: 69 mL/min/{1.73_m2} — AB (ref >90)
GLUCOSE: 103 mg/dL (ref 70–140)
POTASSIUM: 4.4 meq/L (ref 3.5–5.1)
SODIUM: 141 meq/L (ref 136–145)
Total Bilirubin: 0.38 mg/dL (ref 0.20–1.20)
Total Protein: 6.9 g/dL (ref 6.4–8.3)

## 2016-03-12 MED ORDER — FULVESTRANT 250 MG/5ML IM SOLN
500.0000 mg | Freq: Once | INTRAMUSCULAR | Status: AC
  Administered 2016-03-12: 500 mg via INTRAMUSCULAR
  Filled 2016-03-12: qty 10

## 2016-03-12 NOTE — Patient Instructions (Signed)

## 2016-03-12 NOTE — Progress Notes (Signed)
ID: Crystal [name]   DOB: 07-02-1956  MR#: [id number]  WUJ#:[id number]  PCP: Kandice Hams, MD GYN: SU: [name] MD OTHER MD: Earle Gell, Daneen Schick, Arnoldo Hooker Thimmappa]  CHIEF COMPLAINT: recurrent estrogen receptor positive breast cancer in previously irradiated breast; pT1a lung cancer  CURRENT TREATMENT: Fulvestrant  BREAST CANCER HISTORY:   From the original intake note:  The patient had bilateral diagnostic mammography 02/12/2011.  She has a history of cystic changes noted on prior studies in April 2011 and April 2010.  On the Feb 12, 2011 study there were new calcifications identified superiorly in the right breast, without any obvious mass.  The patient was brought back for further studies, 02/19/2011 and Dr. Marcelo Baldy was able to localize the calcifications under stereotactic guidance.  The biopsy ([id number]) showed high-grade ductal carcinoma with papillary features which was 70% estrogen receptor positive, 0% progesterone receptor positive.  There was no definitive invasion and therefore further studies were not performed.   Bilateral breast MRIs were obtained Feb 26, 2011.  This showed marked nodular enhancement throughout both breasts, and in the upper central portion there was a post biopsy area of change measuring up to 3.7 cm.  Most of this is a fluid cavity with a thin rim of enhancement.  There were no suspicious areas in the left breast.  No internal mammary or axillary lymph nodes.  Of course, the patient has a history of hepatic adenomas and these were noted incidentally on the MRI.   Her definitive surgical treatment and subsequent history are as detailed below.  INTERVAL HISTORY: Crystal [name] returns today for follow up of her estrogen receptor positive breast cancer and her early stage lung cancer. She is due for her next fulvestrant injection today. She is still disappointed with the administration of this drug. When it is given too quickly she develops large knots  and is sore for several days. Otherwise, she tolerates it well. She does have some lingering fatigue for a bout a week after the injection. The interval history is remarkable for the removal of her port 2 weeks ago. During this time, Dr. Ninfa Linden also drained some of the fluid to her right axilla. She feels like it is recollecting again.   REVIEW OF SYSTEMS: Fortunately the pain to Sandy's left axilla is improving. She is no longer regularly taking tramadol. She would like to go to physical therapy to improve the range of motion to her right arm. This is limited by the right lung surgical site and her right mastectomy. She denies fevers, chills, nausea, vomiting, or changes in bowel or bladder habits. She still has a dry cough. This is exacerbated by her allergies. She sleeps up on several pillows. She denies great shortness of breath. She has no unusual headaches, dizziness, or vision changes. A detailed review of systems is otherwise stable.  PAST MEDICAL HISTORY: Past Medical History  Diagnosis Date  . History of small bowel obstruction   . Liver masses   . Insomnia   . GERD (gastroesophageal reflux disease)   . Complication of anesthesia     hard to wake up  . Cancer [facility])     right breast  . Anxiety   history of small-bowel obstruction with partial colectomy March 2001.  The patient has a history of hepatic adenomas.  She has had liver resections x2.  The data for this is at [facility] and we will try to obtain it.  She has also had incisional hernia repair.  She has a  history of osteopenia, and she has a history of multiple anti-red cell antibodies making a transfusion difficult.  The patient tells me that she has a history of low calcium and a tendency to get liver injections.  PAST SURGICAL HISTORY: Past Surgical History  Procedure Laterality Date  . Liver surgery    . Laparotomies    . Hernia repair      incisional hernia repair x3  . Breast surgery Right   . Mastectomy w/ sentinel node  biopsy Right 07/05/2015    Procedure: RIGHT MASTECTOMY WITH SENTINEL NODE BIOPSY AND PORT A CATH INSERTION;  Surgeon: [name], MD;  Location: [place];  Service: General;  Laterality: Right;  . Portacath placement Left 07/05/2015    Procedure: INSERTION PORT-A-CATH;  Surgeon: [name], MD;  Location: [place];  Service: General;  Laterality: Left;  . Video assisted thoracoscopy (vats)/wedge resection Right 11/29/2015    Procedure: BRONCHOSCOPY , VIDEO ASSISTED THORACOSCOPY (VATS)/WEDGE RESECTION;  Surgeon: Grace Isaac, MD;  Location: [facility];  Service: Thoracic;  Laterality: Right;  . Lobectomy Right 11/29/2015    Procedure: Completion Right Upper  LOBECTOMY, Lymph node dissection, on Q placement;  Surgeon: Grace Isaac, MD;  Location: [place];  Service: Thoracic;  Laterality: Right;    FAMILY HISTORY Family History  Problem Relation Age of Onset  . Ovarian cancer Mother 33    "metastasized to breasts"  . Breast cancer Maternal Grandmother 48    2-3 recurrences  . Stomach cancer Maternal Grandfather     dx. 47-49; metastatic stomach cancer  . Colon polyps Father     about 2-3 polyps removed at each colonoscopy  . Colon polyps Brother     "a few"  . Other Maternal Aunt     +TAH  . Colon cancer Paternal Aunt 90  . Brain cancer Daughter 17    astrocytoma  . Breast cancer Cousin     dx. 55s  The patient's mother died from ovarian cancer at the age of 61.  The patient's father is alive at age 40.  She has 2 brothers, no sisters.  A maternal grandmother was diagnosed with breast cancer at the age of 74, and the maternal grandfather had stomach cancer at the age of 46.  GYNECOLOGIC HISTORY: The patient had menarche at age 89.  Her [name] menstrual period was in 2005.  She never used hormone replacement.  She used birth control briefly to control bleeding prior to 1980.  She was 60 years old at the birth of her first child.  She started  having mammograms at age 58 because of a strong family history.  SOCIAL HISTORY: Acire works as a Programmer, applications for [employer].   She also heads the local "[employer]" Her husband, Diona Peregoy, is a retired Engineer, maintenance from a [employer] locally.  Son, [name], lives in [place] and works for that [employer]  Daughter, [name], lives in [place] and is a homemaker.   ADVANCED DIRECTIVES: in place  HEALTH MAINTENANCE: Social History  Substance Use Topics  . Smoking status: Never Smoker   . Smokeless tobacco: Never Used  . Alcohol Use: 0.6 oz/week    1 Glasses of wine per week     Comment: social     Colonoscopy:  PAP:  Bone density:  Lipid panel:  Allergies  Allergen Reactions  . Promethazine Hcl Other (See Comments)    Causes shaking  .  Tylenol [Acetaminophen] Other (See Comments)    Avoids Tylenol products due to liver disease   . Penicillins Rash    Current Outpatient Prescriptions  Medication Sig Dispense Refill  . b complex vitamins tablet Take 1 tablet by mouth daily.      . calcium-vitamin D (OSCAL WITH D) 500-200 MG-UNIT per tablet Take 2 tablets by mouth daily.    . famotidine (PEPCID AC) 10 MG chewable tablet Chew 10 mg by mouth daily as needed for heartburn.    . fulvestrant (FASLODEX) 250 MG/5ML injection Inject 500 mg into the muscle every 30 (thirty) days. One injection each buttock over 1-2 minutes. Warm prior to use.    Marland Kitchen glucosamine-chondroitin 500-400 MG tablet Take 1 tablet by mouth 3 (three) times daily.      Marland Kitchen ibuprofen (ADVIL,MOTRIN) 200 MG tablet Take 200 mg by mouth every 6 (six) hours as needed for fever or mild pain.     . Multiple Vitamins-Minerals (MULTIVITAMIN WITH MINERALS) tablet Take 1 tablet by mouth daily.      . Omega-3 Fatty Acids (FISH OIL) 500 MG CAPS Take 1,000 mg by mouth daily.    . traMADol (ULTRAM) 50 MG tablet Take 0.5-1 tablets (25-50 mg total) by mouth every 12 (twelve) hours  as needed (mild pain). 50 tablet 0  . Turmeric 500 MG TABS Take 1,000 mg by mouth daily.     No current facility-administered medications for this visit.    OBJECTIVE: Middle-aged white woman Who appears stated age 48 Vitals:   03/12/16 0825  BP: 113/67  Pulse: 77  Temp: 98.5 F (36.9 C)  Resp: 18     Body mass index is 22.06 kg/(m^2).    ECOG FS: 1  Skin: warm, dry  HEENT: sclerae anicteric, conjunctivae pink, oropharynx clear. No thrush or mucositis.  Lymph Nodes: No cervical or supraclavicular lymphadenopathy  Lungs: clear to auscultation bilaterally, no rales, wheezes, or rhonci  Heart: regular rate and rhythm  Abdomen: round, soft, non tender, positive bowel sounds  Musculoskeletal: No focal spinal tenderness, no peripheral edema, reduced range of motion to right upper arm.  Neuro: non focal, well oriented, positive affect  Breasts: right breast status post mastectomy. No evidence of recurrent disease. Growing fluid collection to right axilla. Left breast unremarkable.    LAB RESULTS: Lab Results  Component Value Date   WBC 5.6 03/12/2016   NEUTROABS 3.7 03/12/2016   HGB 12.7 03/12/2016   HCT 39.6 03/12/2016   MCV 83.4 03/12/2016   PLT 205 03/12/2016      Chemistry      Component Value Date/Time   NA 141 03/12/2016 0807   NA 141 12/03/2015 0430   K 4.4 03/12/2016 0807   K 3.1* 12/03/2015 0430   CL 105 12/03/2015 0430   CL 102 03/15/2013 0912   CO2 25 03/12/2016 0807   CO2 29 12/03/2015 0430   BUN 14.7 03/12/2016 0807   BUN <5* 12/03/2015 0430   CREATININE 0.9 03/12/2016 0807   CREATININE 0.52 12/03/2015 0430      Component Value Date/Time   CALCIUM 9.5 03/12/2016 0807   CALCIUM 8.3* 12/03/2015 0430   ALKPHOS 127 03/12/2016 0807   ALKPHOS 82 12/01/2015 0415   AST 21 03/12/2016 0807   AST 25 12/01/2015 0415   ALT 28 03/12/2016 0807   ALT 22 12/01/2015 0415   BILITOT 0.38 03/12/2016 0807   BILITOT 0.5 12/01/2015 0415       Lab Results   Component Value Date  LABCA2 28 10/20/2012    No components found for: [id number]  No results for input(s): INR in the [name] 168 hours.  Urinalysis    Component Value Date/Time   COLORURINE YELLOW 11/27/2015 1403    STUDIES: Nm Bone Scan Whole Body  02/21/2016  CLINICAL DATA:  Right breast malignancy, right lung malignancy. The patient underwent thoracoscopy and right upper lobe wedge resection on November 29, 2015. EXAM: NUCLEAR MEDICINE WHOLE BODY BONE SCAN TECHNIQUE: Whole body anterior and posterior images were obtained approximately 3 hours after intravenous injection of radiopharmaceutical. RADIOPHARMACEUTICALS:  25 mCi Technetium-59mMDP IV COMPARISON:  Nuclear bone scan of June 13, 2015 FINDINGS: There is adequate uptake of the radiopharmaceutical by the skeleton. There is adequate soft tissue clearance and renal activity. Uptake within the calvarium is normal. Stable increased uptake in the posterior aspect of the mid and lower cervical spine is consistent with degenerative change. There is increased uptake in the lower lumbar spine that is stable and also consistent with degenerative change. New increased uptake in the lateral aspects of the fourth and fifth ribs on the right is consistent with post surgical change. Uptake within the pelvis and lower extremities is normal with exception of degenerative type uptake in the first MTP joint on the right which is stable. Uptake within the upper extremities is normal with exception of uptake in the first [facility] spaces bilaterally. IMPRESSION: There is no evidence of metastatic disease to the skeleton. Stable areas of increased uptake are present as described compatible with degenerative or postsurgical changes. Electronically Signed   By: David  JMartiniqueM.D.   On: 02/21/2016 14:04    ASSESSMENT: 60y.o.  BRCA 1-2 and p-53 negative [place] woman status post right lumpectomy and sentinel lymph node sampling June 2012 for a T1a N0, Stage  IA  invasive ductal carcinoma, grade 3, estrogen receptor 98% positive, progesterone receptor 97% positive, with an MIB-1 of 5% and no HER-2 amplification   (a) note that the initial Right breast biopsy showed DCIS w papillary architecture, estrogen receptor 70% positive, progesterone receptor negative  (1) completed radiation September 2012.   (2) on letrozole starting July 2012, discontinued August 2016 with recurrence  RECURRENT DISEASE: (3) right breast upper outer quadrant biopsy 05/23/2015 was positive for a clinical T1c N0, stage IA invasive ductal carcinoma, grade 2, estrogen receptor 60% positive with moderate staining intensity, progesterone receptor negative, with no HER-2 amplification and an MIB-1 of 90%.  (4) status post right simple mastectomy 07/05/2015 for a pT1c pN0, stage IA invasive ductal carcinoma, grade 3, with negative margins. 4 lymph nodes removed with mastectomy were benign.  (a) the patient is not planning on reconstruction  (5) began cyclophosphamide and docetaxel 07/26/2015, repeated every 21 days 4, final dose 09/26/2015  (6) genetics testing 07/20/2015 was normal and did not reveal a deleterious mutation in ATM, BARD1, BRCA1, BRCA2, BRIP1, CDH1, CHEK2, FANCC, MLH1, MSH2, MSH6, NBN, PALB2, PMS2, PTEN, RAD51C, RAD51D, TP53, and XRCC2. Additionally no variant of uncertain significance were found.   (7) RUL spiculated nodule measuring 1.4 cm noted on staging CT scan 06/13/2015-- stable on repeat CT 07/25/15--moderately hot and slightly larger on PET 10/31/2015  (a) status post right upper lobectomy and lymph node dissection 11/29/2015 for a pT1a pN0, stage IA adenocarcinoma, low-grade, with negative margins   (8) history of multiple hepatic adenomas  (9) fulvestrant started 10/23/2015  PLAN: SLovey Newcomeris doing well today. We reviewed the results of her bone scan with shows no evidence  of osseous metastasis. She is still over due for a bone density scan, so I am  having scheduling arrange one at [facility]. I have also placed a referral to physical therapy to work on the range of motion to her right arm. She will get in tough with Dr. Ninfa Linden regarding the fluid collection to her right axilla, which is actually going to be related to her lung surgery.   She will proceed with her next fulvestrant injection as planned. I am calling ahead again to the injection room to remind them to give this drug slowly, over 1-2 minutes per the administration instructions.   Crystal [name] will have a repeat chest xray and follow up visit with Dr. Servando Snare in June. She will return in 3 months for a follow up visit here. Before this visit she will have a repeat chest CT. She understands and agrees with this plan. She knows the goal of treatment in her case is control. She has been encouraged to call with any issues that might arise before her next visit here.    Genelle Gather Jahsir Rama    03/12/2016

## 2016-03-12 NOTE — Telephone Encounter (Signed)
appt made and avs printed. 6/1 bone density at [facility] 830 am

## 2016-03-13 LAB — CANCER ANTIGEN 27.29: CA 27.29: 26 U/mL (ref 0.0–38.6)

## 2016-03-14 DIAGNOSIS — M810 Age-related osteoporosis without current pathological fracture: Secondary | ICD-10-CM | POA: Diagnosis not present

## 2016-03-14 DIAGNOSIS — M8589 Other specified disorders of bone density and structure, multiple sites: Secondary | ICD-10-CM | POA: Diagnosis not present

## 2016-03-14 DIAGNOSIS — Z5111 Encounter for antineoplastic chemotherapy: Secondary | ICD-10-CM | POA: Diagnosis not present

## 2016-03-20 ENCOUNTER — Other Ambulatory Visit: Payer: Self-pay | Admitting: Cardiothoracic Surgery

## 2016-03-20 DIAGNOSIS — C3490 Malignant neoplasm of unspecified part of unspecified bronchus or lung: Secondary | ICD-10-CM

## 2016-03-21 ENCOUNTER — Ambulatory Visit (INDEPENDENT_AMBULATORY_CARE_PROVIDER_SITE_OTHER): Payer: BLUE CROSS/BLUE SHIELD | Admitting: Cardiothoracic Surgery

## 2016-03-21 ENCOUNTER — Encounter: Payer: Self-pay | Admitting: Cardiothoracic Surgery

## 2016-03-21 ENCOUNTER — Ambulatory Visit
Admission: RE | Admit: 2016-03-21 | Discharge: 2016-03-21 | Disposition: A | Discharge status: Home / Self Care | Payer: BLUE CROSS/BLUE SHIELD | Source: Ambulatory Visit | Attending: Cardiothoracic Surgery | Admitting: Cardiothoracic Surgery

## 2016-03-21 VITALS — BP 131/82 | HR 85 | Resp 16 | Ht 66.0 in | Wt 135.0 lb

## 2016-03-21 DIAGNOSIS — C3490 Malignant neoplasm of unspecified part of unspecified bronchus or lung: Secondary | ICD-10-CM

## 2016-03-21 DIAGNOSIS — Z902 Acquired absence of lung [part of]: Secondary | ICD-10-CM | POA: Diagnosis not present

## 2016-03-21 DIAGNOSIS — C3411 Malignant neoplasm of upper lobe, right bronchus or lung: Secondary | ICD-10-CM

## 2016-03-21 DIAGNOSIS — C50911 Malignant neoplasm of unspecified site of right female breast: Secondary | ICD-10-CM | POA: Diagnosis not present

## 2016-03-21 DIAGNOSIS — R05 Cough: Secondary | ICD-10-CM | POA: Diagnosis not present

## 2016-03-21 NOTE — Progress Notes (Signed)
[address]       [place] [address]             [phone]      Crystal [name] [place] Medical Record #[id number] Date of Birth: December 22, 1955  Referring: Magrinat, Virgie Dad, MD Primary Care: Kandice Hams, MD  Chief Complaint:   POST OP FOLLOW UP 11/29/2015 OPERATIVE REPORT PREOPERATIVE DIAGNOSIS: Right upper lobe lung mass. POSTOPERATIVE DIAGNOSIS: Right upper lobe lung mass, non-small cell primary lung cancer favored on frozen section. PROCEDURE PERFORMED: Video-assisted flexible bronchoscopy, video- assisted thoracoscopy, wedge resection right upper lobe, completion right upper lobectomy with node dissection and placement of On-Q device. SURGEON: Lanelle Bal, MD.  Cancer of overlapping sites of right female breast [facility])   Staging form: Breast, AJCC 7th Edition     Clinical: Stage IA (T1a, N0, M0) - Signed by Chauncey Cruel, MD on 06/05/2015 Lung cancer, right upper lobe [facility])   Staging form: Lung, AJCC 7th Edition     Pathologic stage from 11/30/2015: Stage IA (T1a, N0, cM0) - Signed by Grace Isaac, MD on 11/30/2015 Recurrent cancer of right breast [facility])   Staging form: Breast, AJCC 7th Edition     Clinical: Stage IA (T1c, N0, M0) - Signed by Chauncey Cruel, MD on 06/05/2015    History of Present Illness:      On 12/04/2015 she underwent video assisted  thoracoscopy, with right upper lobectomy and lymph node dissection. The pathology from this procedure ([id number]) showed an invasive adenocarcinoma, well-differentiated, measuring 1.4 cm, focally present at the initial parenchymal margin, with evidence of lymphovascular invasion, and 1 benign intrapulmonary lymph node. She had additional resection of the right upper lobe which showed no evidence of malignancy and there was a total of 9 additional lymph nodes (6 N1 and 3 N2) all negative. Special studies included estrogen and progesterone receptor as well as gross cystic disease  fluid protein, all negative. The tumor was positive for napsin A and TTF-1-1  Since discharge the patient is been doing well increasing her physical activity appropriately. He's walking up to 20 miles a week. She notes some mild breathlessness if she gets very active or is talking a lot.    Past Medical History  Diagnosis Date  . History of small bowel obstruction   . Liver masses   . Insomnia   . GERD (gastroesophageal reflux disease)   . Complication of anesthesia     hard to wake up  . Cancer [facility])     right breast  . Anxiety      History  Smoking status  . Never Smoker   Smokeless tobacco  . Never Used    History  Alcohol Use  . 0.6 oz/week  . 1 Glasses of wine per week    Comment: social     Allergies  Allergen Reactions  . Promethazine Hcl Other (See Comments)    Causes shaking  . Tylenol [Acetaminophen] Other (See Comments)    Avoids Tylenol products due to liver disease   . Penicillins Rash    Current Outpatient Prescriptions  Medication Sig Dispense Refill  . b complex vitamins tablet Take 1 tablet by mouth daily.      . calcium-vitamin D (OSCAL WITH D) 500-200 MG-UNIT per tablet Take 2 tablets by mouth daily.    . famotidine (PEPCID AC) 10 MG chewable tablet Chew 10 mg by mouth daily as needed for heartburn.    Marland Kitchen  fulvestrant (FASLODEX) 250 MG/5ML injection Inject 500 mg into the muscle every 30 (thirty) days. One injection each buttock over 1-2 minutes. Warm prior to use.    Marland Kitchen glucosamine-chondroitin 500-400 MG tablet Take 1 tablet by mouth 3 (three) times daily.      Marland Kitchen ibuprofen (ADVIL,MOTRIN) 200 MG tablet Take 200 mg by mouth every 6 (six) hours as needed for fever or mild pain.     . Multiple Vitamins-Minerals (MULTIVITAMIN WITH MINERALS) tablet Take 1 tablet by mouth daily.      . Omega-3 Fatty Acids (FISH OIL) 500 MG CAPS Take 1,000 mg by mouth daily.    . Turmeric 500 MG TABS Take 1,000 mg by mouth daily.     No current facility-administered  medications for this visit.       Physical Exam: BP 131/82 mmHg  Pulse 85  Resp 16  Ht '5\' 6"'$  (1.676 m)  Wt 135 lb (61.236 kg)  BMI 21.80 kg/m2  SpO2 98%  General appearance: alert, cooperative and no distress Neurologic: intact Heart: regular rate and rhythm, S1, S2 normal, no murmur, click, rub or gallop Lungs: clear to auscultation bilaterally Abdomen: soft, non-tender; bowel sounds normal; no masses,  no organomegaly Extremities: extremities normal, atraumatic, no cyanosis or edema Wound: Right chest incision and port sites are well-healed and sutures are removed Patient notes that 20 mL of lymph fluid was drained from her mastectomy incision several weeks ago and some slightly returned.  Diagnostic Studies & Laboratory data:     Recent Radiology Findings:   Dg Chest 2 View  03/21/2016  CLINICAL DATA:  History of lung cancer. Right upper lobectomy 11/29/2015. Cough. EXAM: CHEST  2 VIEW COMPARISON:  01/11/2016 FINDINGS: Lungs are adequately inflated with stable elevation of the right hemidiaphragm. No focal consolidation or effusion. Cardiomediastinal silhouette is within normal. Multiple surgical clips over the right upper quadrant. Surgical clips over the right perihilar region. Remainder of the exam is unchanged. IMPRESSION: No acute cardiopulmonary disease. Surgical changes as described. Electronically Signed   By: Marin Olp M.D.   On: 03/21/2016 11:26      Recent Lab Findings: Lab Results  Component Value Date   WBC 5.6 03/12/2016   HGB 12.7 03/12/2016   HCT 39.6 03/12/2016   PLT 205 03/12/2016   GLUCOSE 103 03/12/2016   ALT 28 03/12/2016   AST 21 03/12/2016   NA 141 03/12/2016   K 4.4 03/12/2016   CL 105 12/03/2015   CREATININE 0.9 03/12/2016   BUN 14.7 03/12/2016   CO2 25 03/12/2016   INR 1.06 11/27/2015      Assessment / Plan:   Stable following right upper lobectomy for stage IA adenocarcinoma of the lung, Patient increasing her physical activity  appropriately, still has some fatigue and chest soreness but is improving. Plan to see her back in 3 months with a follow-up  CT of the chest 6 months postop from her lung resection for stage I a adenocarcinoma of the lung.    Grace Isaac MD      [address] [place] [address] Office [phone]   Beeper [phone]  03/21/2016 11:50 AM

## 2016-04-05 ENCOUNTER — Other Ambulatory Visit: Payer: BLUE CROSS/BLUE SHIELD

## 2016-04-05 ENCOUNTER — Ambulatory Visit: Payer: BLUE CROSS/BLUE SHIELD

## 2016-04-08 ENCOUNTER — Other Ambulatory Visit: Payer: BLUE CROSS/BLUE SHIELD

## 2016-04-08 ENCOUNTER — Other Ambulatory Visit (HOSPITAL_BASED_OUTPATIENT_CLINIC_OR_DEPARTMENT_OTHER): Payer: BLUE CROSS/BLUE SHIELD

## 2016-04-08 ENCOUNTER — Ambulatory Visit (HOSPITAL_BASED_OUTPATIENT_CLINIC_OR_DEPARTMENT_OTHER): Payer: BLUE CROSS/BLUE SHIELD

## 2016-04-08 ENCOUNTER — Ambulatory Visit: Payer: BLUE CROSS/BLUE SHIELD

## 2016-04-08 VITALS — BP 130/57 | HR 76 | Temp 98.3°F | Resp 20

## 2016-04-08 DIAGNOSIS — Z5111 Encounter for antineoplastic chemotherapy: Secondary | ICD-10-CM

## 2016-04-08 DIAGNOSIS — C50811 Malignant neoplasm of overlapping sites of right female breast: Secondary | ICD-10-CM

## 2016-04-08 DIAGNOSIS — C50412 Malignant neoplasm of upper-outer quadrant of left female breast: Secondary | ICD-10-CM

## 2016-04-08 DIAGNOSIS — C50911 Malignant neoplasm of unspecified site of right female breast: Secondary | ICD-10-CM

## 2016-04-08 LAB — CBC WITH DIFFERENTIAL/PLATELET
BASO%: 0.3 % (ref 0.0–2.0)
BASOS ABS: 0 10*3/uL (ref 0.0–0.1)
EOS ABS: 0.2 10*3/uL (ref 0.0–0.5)
EOS%: 2.7 % (ref 0.0–7.0)
HEMATOCRIT: 40.3 % (ref 34.8–46.6)
HEMOGLOBIN: 13 g/dL (ref 11.6–15.9)
LYMPH#: 1.8 10*3/uL (ref 0.9–3.3)
LYMPH%: 30.1 % (ref 14.0–49.7)
MCH: 27.8 pg (ref 25.1–34.0)
MCHC: 32.3 g/dL (ref 31.5–36.0)
MCV: 86.3 fL (ref 79.5–101.0)
MONO#: 0.3 10*3/uL (ref 0.1–0.9)
MONO%: 5.5 % (ref 0.0–14.0)
NEUT#: 3.7 10*3/uL (ref 1.5–6.5)
NEUT%: 61.4 % (ref 38.4–76.8)
PLATELETS: 221 10*3/uL (ref 145–400)
RBC: 4.67 10*6/uL (ref 3.70–5.45)
RDW: 15.3 % — AB (ref 11.2–14.5)
WBC: 6 10*3/uL (ref 3.9–10.3)

## 2016-04-08 LAB — COMPREHENSIVE METABOLIC PANEL
ALBUMIN: 3.9 g/dL (ref 3.5–5.0)
ALK PHOS: 122 U/L (ref 40–150)
ALT: 30 U/L (ref 0–55)
ANION GAP: 11 meq/L (ref 3–11)
AST: 20 U/L (ref 5–34)
BUN: 13.5 mg/dL (ref 7.0–26.0)
CALCIUM: 9.3 mg/dL (ref 8.4–10.4)
CO2: 21 mEq/L — ABNORMAL LOW (ref 22–29)
CREATININE: 0.8 mg/dL (ref 0.6–1.1)
Chloride: 107 mEq/L (ref 98–109)
EGFR: 85 mL/min/{1.73_m2} — ABNORMAL LOW (ref >90)
Glucose: 107 mg/dl (ref 70–140)
Potassium: 4.2 mEq/L (ref 3.5–5.1)
Sodium: 139 mEq/L (ref 136–145)
Total Bilirubin: 0.39 mg/dL (ref 0.20–1.20)
Total Protein: 7 g/dL (ref 6.4–8.3)

## 2016-04-08 MED ORDER — FULVESTRANT 250 MG/5ML IM SOLN
500.0000 mg | Freq: Once | INTRAMUSCULAR | Status: AC
  Administered 2016-04-08: 500 mg via INTRAMUSCULAR
  Filled 2016-04-08: qty 10

## 2016-04-08 NOTE — Patient Instructions (Signed)

## 2016-04-11 ENCOUNTER — Ambulatory Visit: Payer: BLUE CROSS/BLUE SHIELD | Attending: Oncology | Admitting: Physical Therapy

## 2016-04-11 DIAGNOSIS — I972 Postmastectomy lymphedema syndrome: Secondary | ICD-10-CM

## 2016-04-11 DIAGNOSIS — M25611 Stiffness of right shoulder, not elsewhere classified: Secondary | ICD-10-CM

## 2016-04-11 NOTE — Therapy (Signed)
[place], [place], [address] Phone: [phone]   Fax:  [phone]  Physical Therapy Evaluation  Patient Details  Name: Crystal [name] MRN: [id number] Date of Birth: [birth year] Referring Provider: Iver Nestle   Encounter Date: 04/11/2016      PT End of Session - 04/11/16 1218    Visit Number 1   Number of Visits 9   Date for PT Re-Evaluation 05/11/16   PT Start Time 1100   PT Stop Time 1150   PT Time Calculation (min) 50 min   Activity Tolerance Patient tolerated treatment well   Behavior During Therapy [facility] for tasks assessed/performed      Past Medical History  Diagnosis Date  . History of small bowel obstruction   . Liver masses   . Insomnia   . GERD (gastroesophageal reflux disease)   . Complication of anesthesia     hard to wake up  . Cancer [facility])     right breast  . Anxiety     Past Surgical History  Procedure Laterality Date  . Liver surgery    . Laparotomies    . Hernia repair      incisional hernia repair x3  . Breast surgery Right   . Mastectomy w/ sentinel node biopsy Right 07/05/2015    Procedure: RIGHT MASTECTOMY WITH SENTINEL NODE BIOPSY AND PORT A CATH INSERTION;  Surgeon: [name], MD;  Location: [place];  Service: General;  Laterality: Right;  . Portacath placement Left 07/05/2015    Procedure: INSERTION PORT-A-CATH;  Surgeon: [name], MD;  Location: [place];  Service: General;  Laterality: Left;  . Video assisted thoracoscopy (vats)/wedge resection Right 11/29/2015    Procedure: BRONCHOSCOPY , VIDEO ASSISTED THORACOSCOPY (VATS)/WEDGE RESECTION;  Surgeon: Grace Isaac, MD;  [facility];  Service: Thoracic;  Laterality: Right;  . Lobectomy Right 11/29/2015    Procedure: Completion Right Upper  LOBECTOMY, Lymph node dissection, on Q placement;  Surgeon: Grace Isaac, MD;  Location: [place];  Service:  Thoracic;  Laterality: Right;    There were no vitals filed for this visit.       Subjective Assessment - 04/11/16 1104    Subjective still has alot of numbness from the blocks, She is feeling pressure in axilla    Pertinent History right breast lumpectomy with 3 nodes removed  6/20112, recurrance with right mastectomy 06/2015 with 4nodes removed, wedge resection for upper right lobe removed in 11/2105 port removed5/15//2017.  she had had pain in axilla througout this time that was relieved when pocket of fluid was drained at time of port removal  She has been told she has osteoporosis    Patient Stated Goals to rid of pain and fluid in armpit and regain the range of motion    Currently in Pain? No/denies  I am aware of the pressure in my armpit             [facility] PT Assessment - 04/11/16 0001    Assessment   Medical Diagnosis breast cancer    Referring Provider Iver Nestle    Onset Date/Surgical Date 09/14/15   Hand Dominance Right   Precautions   Precautions Other (comment)   Precaution Comments currently trearment for lung cancer every month.   Restrictions   Weight Bearing Restrictions No   Balance Screen   Has the patient fallen in the past 6 months No   Has the patient had a  decrease in activity level because of a fear of falling?  No   Is the patient reluctant to leave their home because of a fear of falling?  No   Home Ecologist residence   Living Arrangements Spouse/significant other   Available Help at Discharge Available PRN/intermittently   Type of [place]   Prior Function   Level of Independence Independent   Leisure walks about a week    Cognition   Overall Cognitive Status Within Functional Limits for tasks assessed   Observation/Other Assessments   Observations Pt with mutliple scars on abdomen and chest with visible fullness above and below mastectomy scar on right anterior chest She has assymetrical movment with  decreased stabalization of right scapula with bilateral shoulder elevation    Sensation   Additional Comments occasional pains in feet and hands. possbily from chemotherapy or from IV sticks    Posture/Postural Control   Posture/Postural Control No significant limitations   AROM   Right Shoulder Flexion 145 Degrees   Right Shoulder ABduction 128 Degrees   Right Shoulder Internal Rotation 67 Degrees   Right Shoulder External Rotation 89 Degrees   Left Shoulder Flexion 175 Degrees   Left Shoulder ABduction 175 Degrees   Left Shoulder Internal Rotation 85 Degrees   Left Shoulder External Rotation 95 Degrees   Strength   Right Shoulder Flexion 3-/5   Right Shoulder ABduction 3/5   Right Shoulder Internal Rotation 3-/5   Right Shoulder External Rotation 3-/5   Left Shoulder Flexion 4/5   Left Shoulder ABduction 4/5   Left Shoulder Internal Rotation 4/5   Left Shoulder External Rotation 4/5   Palpation   Palpation comment decreased tissue extensibility at anterior chest mastectomy scar, fullness at chest below scar  She has tighness and fullness in her right axilla with visible fullness around right lateral chest             LYMPHEDEMA/ONCOLOGY QUESTIONNAIRE - 04/11/16 1135    Right Upper Extremity Lymphedema   15 cm Proximal to Olecranon Process 26.5 cm   Olecranon Process 24.5 cm   15 cm Proximal to Ulnar Styloid Process 23 cm   Just Proximal to Ulnar Styloid Process 15.6 cm   Across Hand at [employer] 18.6 cm   At [place] of 2nd Digit 6.1 cm   Left Upper Extremity Lymphedema   15 cm Proximal to Olecranon Process 26.5 cm   Olecranon Process 24 cm   15 cm Proximal to Ulnar Styloid Process 23 cm   Just Proximal to Ulnar Styloid Process 15.4 cm   Across Hand at [employer] 18.5 cm   At [place] of 2nd Digit 6.1 cm           Quick Dash - 04/11/16 0001    Open a tight or new jar Moderate difficulty   Do heavy household chores (wash walls, wash floors) Moderate  difficulty   Carry a shopping bag or briefcase Moderate difficulty   Wash your back Severe difficulty   Use a knife to cut food Mild difficulty   Recreational activities in which you take some force or impact through your arm, shoulder, or hand (golf, hammering, tennis) Severe difficulty   During the past week, to what extent has your arm, shoulder or hand problem interfered with your normal social activities with family, friends, neighbors, or groups? Quite a bit   During the past week, to what extent has your arm, shoulder or hand problem  limited your work or other regular daily activities Modererately   Arm, shoulder, or hand pain. Moderate   Tingling (pins and needles) in your arm, shoulder, or hand Moderate   Difficulty Sleeping Moderate difficulty   DASH Score 54.55 %             [facility] Adult PT Treatment/Exercise - 04/11/16 0001    Self-Care   Self-Care Other Self-Care Comments   Other Self-Care Comments  issued patient a piece of thick foam with divots cut out in thin stockinette to wear in bra and ball of foam chips to wear in axilla to see if it can help decrease her symptoms of fullness                PT Education - 04/11/16 1218    Education provided Yes   Education Details remove foam at any sign of discomfort    Person(s) Educated Patient   Methods Explanation   Comprehension Verbalized understanding                [facility] Goals - 04/11/16 1228    CC Long Term Goal  #1   Title Patient will be independent in self manual lymph drainage   Time 4   Period Weeks   Status New   CC Long Term Goal  #2   Title Patient will be know how to obtain and use compression garments for maintenance phase of treatment   Time 4   Period Weeks   Status New   CC Long Term Goal  #3   Title Increase right shoulder abduction to >/= 150 degrees for increased ease reaching   Baseline 128   Time 4   Period Weeks   Status New   CC Long Term Goal  #4   Title  Patient will decrease the DASH score to <39    to demonstrate increased functional use of upper extremity   Baseline 54.55   Time 4   Period Weeks   Status New   CC Long Term Goal  #5   Title Report she is able to reach overhead without complaints of increased pain in right axilla or shoulder.   Time 4   Period Weeks   Status New            Plan - 04/11/16 1219    Clinical Impression Statement 61 yo female with multiple surgeries to right upper quadrant presents with stiffness and lymphedema in right shoulder and upper trunk area.  She is not wearing a compression bra and is not knowledgeable about complete decngestive therapy    Clinical Impairments Affecting Rehab Potential previous radaiton to right upper quadrant skin, ongoing treatment for lung cancer , osteoporosis    PT Frequency 2x / week   PT Duration 4 weeks   PT Treatment/Interventions ADLs/Self Care Home Management;Manual lymph drainage;Compression bandaging;Scar mobilization;Passive range of motion;Patient/family education;DME Instruction;Taping;Manual techniques;Therapeutic exercise   PT Next Visit Plan assess [place] of foam for compression, show pt Belisse and wearease bras as possible options, stretching to right shoulder, begin manual lymph drainage to right chest and axilla and lateral trunk    Consulted and Agree with Plan of Care Patient      Patient will benefit from skilled therapeutic intervention in order to improve the following deficits and impairments:  Decreased range of motion, Increased fascial restricitons, Impaired UE functional use, Decreased activity tolerance, Impaired perceived functional ability, Pain, Impaired flexibility, Decreased scar mobility, Decreased knowledge of use of  DME, Decreased strength, Increased edema  Visit Diagnosis: Stiffness of right shoulder, not elsewhere classified - Plan: PT plan of care cert/re-cert  Postmastectomy lymphedema - Plan: PT plan of care  cert/re-cert     Problem List Patient Active Problem List   Diagnosis Date Noted  . Lung cancer, right upper lobe ([place]) 11/30/2015  . S/P lobectomy of lung 11/29/2015  . Adenoma of liver 11/06/2015  . Genetic testing 08/11/2015  . Recurrent cancer of right breast ([place]) 06/05/2015  . Cancer of overlapping sites of right female breast ([place]) 03/16/2012   Donato Heinz. Owens Shark PT  Norwood Levo 04/11/2016, 12:36 PM  [place], [place], [address] Phone: [phone]   Fax:  [phone]  Name: Crystal [name] MRN: [id number] Date of Birth: [birth year]

## 2016-04-15 ENCOUNTER — Other Ambulatory Visit: Payer: BLUE CROSS/BLUE SHIELD

## 2016-04-15 ENCOUNTER — Ambulatory Visit: Payer: BLUE CROSS/BLUE SHIELD | Attending: Oncology | Admitting: Physical Therapy

## 2016-04-15 ENCOUNTER — Ambulatory Visit: Payer: BLUE CROSS/BLUE SHIELD

## 2016-04-15 DIAGNOSIS — Z9011 Acquired absence of right breast and nipple: Secondary | ICD-10-CM | POA: Insufficient documentation

## 2016-04-15 DIAGNOSIS — M25611 Stiffness of right shoulder, not elsewhere classified: Secondary | ICD-10-CM | POA: Insufficient documentation

## 2016-04-15 DIAGNOSIS — Z9189 Other specified personal risk factors, not elsewhere classified: Secondary | ICD-10-CM | POA: Insufficient documentation

## 2016-04-15 DIAGNOSIS — I972 Postmastectomy lymphedema syndrome: Secondary | ICD-10-CM | POA: Diagnosis not present

## 2016-04-15 NOTE — Therapy (Signed)
[place], [place], [address] Phone: [phone]   Fax:  [phone]  Physical Therapy Treatment  Patient Details  Name: Crystal [name] MRN: [id number] Date of Birth: 1956-08-20 Referring Provider: Iver Nestle   Encounter Date: 04/15/2016      PT End of Session - 04/15/16 1812    Visit Number 2   Number of Visits 9   Date for PT Re-Evaluation 05/11/16   PT Start Time 0935   PT Stop Time 1020   PT Time Calculation (min) 45 min   Activity Tolerance Patient tolerated treatment well   Behavior During Therapy [facility] for tasks assessed/performed      Past Medical History  Diagnosis Date  . History of small bowel obstruction   . Liver masses   . Insomnia   . GERD (gastroesophageal reflux disease)   . Complication of anesthesia     hard to wake up  . Cancer [facility])     right breast  . Anxiety     Past Surgical History  Procedure Laterality Date  . Liver surgery    . Laparotomies    . Hernia repair      incisional hernia repair x3  . Breast surgery Right   . Mastectomy w/ sentinel node biopsy Right 07/05/2015    Procedure: RIGHT MASTECTOMY WITH SENTINEL NODE BIOPSY AND PORT A CATH INSERTION;  Surgeon: [name], MD;  Location: [place];  Service: General;  Laterality: Right;  . Portacath placement Left 07/05/2015    Procedure: INSERTION PORT-A-CATH;  Surgeon: [name], MD;  Location: [place];  Service: General;  Laterality: Left;  . Video assisted thoracoscopy (vats)/wedge resection Right 11/29/2015    Procedure: BRONCHOSCOPY , VIDEO ASSISTED THORACOSCOPY (VATS)/WEDGE RESECTION;  Surgeon: Grace Isaac, MD;  [facility];  Service: Thoracic;  Laterality: Right;  . Lobectomy Right 11/29/2015    Procedure: Completion Right Upper  LOBECTOMY, Lymph node dissection, on Q placement;  Surgeon: Grace Isaac, MD;  Location: [place];  Service:  Thoracic;  Laterality: Right;    There were no vitals filed for this visit.      Subjective Assessment - 04/15/16 0937    Subjective Tried foam pieces.  It's hard to keep the little one under my arm in place, but using a pillow under the arm.  I can see the divots from the rectangular one.  Has been wearing tighter bras to try to have some compression; can't tell yet about whether the bra is working.   Currently in Pain? Yes   Pain Score 3    Pain Location Back   Pain Orientation Mid   Aggravating Factors  fluid   Pain Relieving Factors nothing                         [facility] Adult PT Treatment/Exercise - 04/15/16 0001    Self-Care   Other Self-Care Comments  Discussed compression bras and showed patient Wear Ease and Bellisse bras.  Also discussed sports bra options and Spanx garment that would come up to her axilla for compression around trunk   Manual Therapy   Manual Therapy Soft tissue mobilization;Myofascial release;Scapular mobilization   Soft tissue mobilization around right mastectomy incision in supine; around lung lobectomy incisions in left sidelying   Myofascial Release crosshands technique at right chest in horizontal, vertical, and diagonal directions; in supine, right UE myofascial pulling and pulling with  concurrent soft tissue mobilzation at right chest   Scapular Mobilization in left sidelying for right scapula                        [facility] Goals - 04/11/16 1228    CC Long Term Goal  #1   Title Patient will be independent in self manual lymph drainage   Time 4   Period Weeks   Status New   CC Long Term Goal  #2   Title Patient will be know how to obtain and use compression garments for maintenance phase of treatment   Time 4   Period Weeks   Status New   CC Long Term Goal  #3   Title Increase right shoulder abduction to >/= 150 degrees for increased ease reaching   Baseline 128   Time 4   Period Weeks   Status New    CC Long Term Goal  #4   Title Patient will decrease the DASH score to <39    to demonstrate increased functional use of upper extremity   Baseline 54.55   Time 4   Period Weeks   Status New   CC Long Term Goal  #5   Title Report she is able to reach overhead without complaints of increased pain in right axilla or shoulder.   Time 4   Period Weeks   Status New            Plan - 04/15/16 1812    Clinical Impression Statement Patient did well with initial treatment today.  Focus was on soft tissue tightness and not yet manual lymph drainage; right chest tissue is particularly tight, VATS incision at right mid-back less so.  Also discussed compression garment options.   Rehab Potential Good   Clinical Impairments Affecting Rehab Potential previous radaiton to right upper quadrant skin, ongoing treatment for lung cancer , osteoporosis    PT Frequency 2x / week   PT Duration 4 weeks   PT Treatment/Interventions ADLs/Self Care Home Management;Manual lymph drainage;Compression bandaging;Scar mobilization;Passive range of motion;Patient/family education;DME Instruction;Taping;Manual techniques;Therapeutic exercise   PT Next Visit Plan Continue myofascial work and stretching to right shoulder; begin manual lymph drainage unless patient saw surgeon, had more fluid drawn off, and feels better   PT Home Exercise Plan continue walking   Consulted and Agree with Plan of Care Patient      Patient will benefit from skilled therapeutic intervention in order to improve the following deficits and impairments:  Decreased range of motion, Increased fascial restricitons, Impaired UE functional use, Decreased activity tolerance, Impaired perceived functional ability, Pain, Impaired flexibility, Decreased scar mobility, Decreased knowledge of use of DME, Decreased strength, Increased edema  Visit Diagnosis: Stiffness of right shoulder, not elsewhere classified  Shoulder stiffness,  right  Postmastectomy lymphedema     Problem List Patient Active Problem List   Diagnosis Date Noted  . Lung cancer, right upper lobe ([place]) 11/30/2015  . S/P lobectomy of lung 11/29/2015  . Adenoma of liver 11/06/2015  . Genetic testing 08/11/2015  . Recurrent cancer of right breast ([place]) 06/05/2015  . Cancer of overlapping sites of right female breast ([place]) 03/16/2012    SALISBURY,DONNA 04/15/2016, 6:15 PM  [place], [place], [address] Phone: [phone]   Fax:  [phone]  Name: Crystal [name] MRN: [id number] Date of Birth: [birth year]    Serafina Royals, PT 04/15/2016 6:15 PM

## 2016-04-17 ENCOUNTER — Ambulatory Visit: Payer: BLUE CROSS/BLUE SHIELD | Admitting: Physical Therapy

## 2016-04-17 DIAGNOSIS — Z9189 Other specified personal risk factors, not elsewhere classified: Secondary | ICD-10-CM | POA: Diagnosis not present

## 2016-04-17 DIAGNOSIS — M25611 Stiffness of right shoulder, not elsewhere classified: Secondary | ICD-10-CM

## 2016-04-17 DIAGNOSIS — Z9011 Acquired absence of right breast and nipple: Secondary | ICD-10-CM | POA: Diagnosis not present

## 2016-04-17 DIAGNOSIS — I972 Postmastectomy lymphedema syndrome: Secondary | ICD-10-CM | POA: Diagnosis not present

## 2016-04-17 NOTE — Patient Instructions (Signed)
Go back to doing the doorway and wall walking stretches.

## 2016-04-17 NOTE — Therapy (Signed)
[place], [place], [address] Phone: [phone]   Fax:  [phone]  Physical Therapy Treatment  Patient Details  Name: Crystal [name] MRN: [id number] Date of Birth: December 10, 1955 Referring Provider: Iver Nestle   Encounter Date: 04/17/2016      PT End of Session - 04/17/16 1153    Visit Number 3   Number of Visits 9   Date for PT Re-Evaluation 05/11/16   PT Start Time 1059   PT Stop Time 1146   PT Time Calculation (min) 47 min   Activity Tolerance Patient tolerated treatment well   Behavior During Therapy [facility] for tasks assessed/performed      Past Medical History  Diagnosis Date  . History of small bowel obstruction   . Liver masses   . Insomnia   . GERD (gastroesophageal reflux disease)   . Complication of anesthesia     hard to wake up  . Cancer [facility])     right breast  . Anxiety     Past Surgical History  Procedure Laterality Date  . Liver surgery    . Laparotomies    . Hernia repair      incisional hernia repair x3  . Breast surgery Right   . Mastectomy w/ sentinel node biopsy Right 07/05/2015    Procedure: RIGHT MASTECTOMY WITH SENTINEL NODE BIOPSY AND PORT A CATH INSERTION;  Surgeon: [name], MD;  Location: [place];  Service: General;  Laterality: Right;  . Portacath placement Left 07/05/2015    Procedure: INSERTION PORT-A-CATH;  Surgeon: [name], MD;  Location: [place];  Service: General;  Laterality: Left;  . Video assisted thoracoscopy (vats)/wedge resection Right 11/29/2015    Procedure: BRONCHOSCOPY , VIDEO ASSISTED THORACOSCOPY (VATS)/WEDGE RESECTION;  Surgeon: Grace Isaac, MD;  [facility];  Service: Thoracic;  Laterality: Right;  . Lobectomy Right 11/29/2015    Procedure: Completion Right Upper  LOBECTOMY, Lymph node dissection, on Q placement;  Surgeon: Grace Isaac, MD;  Location: [place];  Service:  Thoracic;  Laterality: Right;    There were no vitals filed for this visit.      Subjective Assessment - 04/17/16 1102    Subjective I felt a little looser after last session.  I did paint yesterday for about an hour and did it in short bursts.  Called and left a message about possibly getting drained again.   Currently in Pain? No/denies            [facility] PT Assessment - 04/17/16 0001    AROM   Right Shoulder Flexion 140 Degrees  uncomfortable   Right Shoulder ABduction 141 Degrees  uncomfortable                     [facility] Adult PT Treatment/Exercise - 04/17/16 0001    Shoulder Exercises: Stretch   Other Shoulder Stretches in supine hooklying with arms outstretched, lower trunk rotation to left for right chest stretch   Other Shoulder Stretches seated shoulder rolls backward and bilat. ER with scapular retraction x 3 at end of session   Manual Therapy   Manual Therapy Passive ROM;Neural Stretch   Soft tissue mobilization around right mastectomy incision in supine;   Myofascial Release crosshands technique at right chest in horizontal, vertical, and diagonal directions; in supine, right UE myofascial pulling and pulling with concurrent soft tissue mobilzation at right chest   Scapular Mobilization in left sidelying for right  scapula   Passive ROM right shoulder flexion and abduction to tolerance; supine over towel roll,  bilat. pect minor stretches; right horizontal abduction stretch   Neural Stretch to right UE                        [facility] Goals - 04/17/16 1105    CC Long Term Goal  #1   Title Patient will be independent in self manual lymph drainage   Status On-going   CC Long Term Goal  #2   Title Patient will be know how to obtain and use compression garments for maintenance phase of treatment   Status Partially Met   CC Long Term Goal  #3   Title Increase right shoulder abduction to >/= 150 degrees for increased ease reaching    Status Partially Met   CC Long Term Goal  #4   Title Patient will decrease the DASH score to <39    to demonstrate increased functional use of upper extremity   Status On-going   CC Long Term Goal  #5   Title Report she is able to reach overhead without complaints of increased pain in right axilla or shoulder.   Status On-going            Plan - 04/17/16 1154    Clinical Impression Statement Had some burning pain especially with manual stretches across axilla, limiting how much of this could be done.  This stopped as soon as the stretch was stopped.  She did feel some soreness at end of session, so was asked to keep track of how it does and report in at next visit.  Significant tightness at right chest and pect.  Right shoulder active abduction measurement was increased, but patient feels she pushed into her discomfort more than on initial eval.   Rehab Potential Good   Clinical Impairments Affecting Rehab Potential previous radiaton to right upper quadrant skin, ongoing treatment for lung cancer , osteoporosis    PT Frequency 2x / week   PT Duration 4 weeks   PT Treatment/Interventions ADLs/Self Care Home Management;Manual lymph drainage;Compression bandaging;Scar mobilization;Passive range of motion;Patient/family education;DME Instruction;Taping;Manual techniques;Therapeutic exercise   PT Next Visit Plan Continue myofascial work and stretching to right shoulder; begin manual lymph drainage unless patient saw surgeon, had more fluid drawn off, and feels better   PT Home Exercise Plan continue walking, resume shoulder stretches   Consulted and Agree with Plan of Care Patient      Patient will benefit from skilled therapeutic intervention in order to improve the following deficits and impairments:  Decreased range of motion, Increased fascial restricitons, Impaired UE functional use, Decreased activity tolerance, Impaired perceived functional ability, Pain, Impaired flexibility, Decreased  scar mobility, Decreased knowledge of use of DME, Decreased strength, Increased edema  Visit Diagnosis: Stiffness of right shoulder, not elsewhere classified     Problem List Patient Active Problem List   Diagnosis Date Noted  . Lung cancer, right upper lobe ([place]) 11/30/2015  . S/P lobectomy of lung 11/29/2015  . Adenoma of liver 11/06/2015  . Genetic testing 08/11/2015  . Recurrent cancer of right breast ([place]) 06/05/2015  . Cancer of overlapping sites of right female breast [facility]) 03/16/2012    SALISBURY,DONNA 04/17/2016, 11:58 AM  [place], [place], [address] Phone: [phone]   Fax:  [phone]  Name: Crystal [name] MRN: [id number] Date of Birth: 1956-04-29  Serafina Royals, PT 04/17/2016 11:58 AM

## 2016-04-22 ENCOUNTER — Ambulatory Visit: Payer: BLUE CROSS/BLUE SHIELD | Admitting: Physical Therapy

## 2016-04-22 DIAGNOSIS — Z9011 Acquired absence of right breast and nipple: Secondary | ICD-10-CM | POA: Diagnosis not present

## 2016-04-22 DIAGNOSIS — M25611 Stiffness of right shoulder, not elsewhere classified: Secondary | ICD-10-CM

## 2016-04-22 DIAGNOSIS — I972 Postmastectomy lymphedema syndrome: Secondary | ICD-10-CM | POA: Diagnosis not present

## 2016-04-22 DIAGNOSIS — Z9189 Other specified personal risk factors, not elsewhere classified: Secondary | ICD-10-CM | POA: Diagnosis not present

## 2016-04-22 NOTE — Therapy (Signed)
[place], [place], [address] Phone: [phone]   Fax:  [phone]  Physical Therapy Treatment  Patient Details  Name: Crystal [name] MRN: [id number] Date of Birth: [birth year] Referring Provider: Iver Nestle   Encounter Date: 04/22/2016      PT End of Session - 04/22/16 1200    Visit Number 4   Number of Visits 9   Date for PT Re-Evaluation 05/11/16   PT Start Time 1112   PT Stop Time 1156   PT Time Calculation (min) 44 min   Activity Tolerance Patient tolerated treatment well   Behavior During Therapy [facility] for tasks assessed/performed      Past Medical History  Diagnosis Date  . History of small bowel obstruction   . Liver masses   . Insomnia   . GERD (gastroesophageal reflux disease)   . Complication of anesthesia     hard to wake up  . Cancer [facility])     right breast  . Anxiety     Past Surgical History  Procedure Laterality Date  . Liver surgery    . Laparotomies    . Hernia repair      incisional hernia repair x3  . Breast surgery Right   . Mastectomy w/ sentinel node biopsy Right 07/05/2015    Procedure: RIGHT MASTECTOMY WITH SENTINEL NODE BIOPSY AND PORT A CATH INSERTION;  Surgeon: [name], MD;  Location: [place];  Service: General;  Laterality: Right;  . Portacath placement Left 07/05/2015    Procedure: INSERTION PORT-A-CATH;  Surgeon: [name], MD;  Location: [place];  Service: General;  Laterality: Left;  . Video assisted thoracoscopy (vats)/wedge resection Right 11/29/2015    Procedure: BRONCHOSCOPY , VIDEO ASSISTED THORACOSCOPY (VATS)/WEDGE RESECTION;  Surgeon: Grace Isaac, MD;  [facility];  Service: Thoracic;  Laterality: Right;  . Lobectomy Right 11/29/2015    Procedure: Completion Right Upper  LOBECTOMY, Lymph node dissection, on Q placement;  Surgeon: Grace Isaac, MD;  Location: [place];  Service:  Thoracic;  Laterality: Right;    There were no vitals filed for this visit.      Subjective Assessment - 04/22/16 1111    Subjective Was sore after last visit, so didn't pain the Thursday and Friday after that.  Did stretches gently.  Yesterday I felt better.  I feel like this lump is not as hard or as big, so I think we did some good.  Called and left another message this morning.   Currently in Pain? Yes   Pain Location Axilla  chest   Pain Orientation Right   Pain Descriptors / Indicators Tightness;Pressure   Aggravating Factors  certain movements                         [facility] Adult PT Treatment/Exercise - 04/22/16 0001    Shoulder Exercises: Stretch   Other Shoulder Stretches seated backward shoulder rolls   Manual Therapy   Manual Therapy Manual Lymphatic Drainage (MLD)   Soft tissue mobilization around right mastectomy incision in supine;   Myofascial Release crosshands technique at right chest in horizontal, vertical, and diagonal directions; in supine, right UE myofascial pulling and pulling with concurrent soft tissue mobilzation across right axilla and lateral chest tightness   Scapular Mobilization in left sidelying for right scapula   Manual Lymphatic Drainage (MLD) in supine, diaphragmatic breathing, short neck, superficial abdomen, left axilla and anterior  interaxillary anastomosis, right groin and axillo-inguinal anastomosis; in left sidelying, posterior interaxillary anastomosis and right axillo-inguinal anastomosis, with time spent at puffy areas at upper right flank   Passive ROM right shoulder for ER, abduction, and flexion                        [facility] Term Clinic Goals - 04/17/16 1105    CC Long Term Goal  #1   Title Patient will be independent in self manual lymph drainage   Status On-going   CC Long Term Goal  #2   Title Patient will be know how to obtain and use compression garments for maintenance phase of treatment   Status  Partially Met   CC Long Term Goal  #3   Title Increase right shoulder abduction to >/= 150 degrees for increased ease reaching   Status Partially Met   CC Long Term Goal  #4   Title Patient will decrease the DASH score to <39    to demonstrate increased functional use of upper extremity   Status On-going   CC Long Term Goal  #5   Title Report she is able to reach overhead without complaints of increased pain in right axilla or shoulder.   Status On-going            Plan - 04/22/16 1200    Clinical Impression Statement Pt. with soreness after last session; she reported it felt less sore at end of session today.  Soft tissue at right chest around incision felt more mobile today than before, but still with significant tightness at right anterior axilla/lateral chest with arm in flexion or abduction.  Manual lymph drainage done today to see if this would help swelling.   Rehab Potential Good   Clinical Impairments Affecting Rehab Potential previous radiaton to right upper quadrant skin, ongoing treatment for lung cancer , osteoporosis    PT Frequency 2x / week   PT Duration 4 weeks   PT Treatment/Interventions ADLs/Self Care Home Management;Manual lymph drainage;Compression bandaging;Scar mobilization;Passive range of motion;Patient/family education;DME Instruction;Taping;Manual techniques;Therapeutic exercise   PT Next Visit Plan Measure and assess goals.  Continue myofascial work and stretching to right shoulder; continue manual lymph drainage if beneficial.   Consulted and Agree with Plan of Care Patient      Patient will benefit from skilled therapeutic intervention in order to improve the following deficits and impairments:  Decreased range of motion, Increased fascial restricitons, Impaired UE functional use, Decreased activity tolerance, Impaired perceived functional ability, Pain, Impaired flexibility, Decreased scar mobility, Decreased knowledge of use of DME, Decreased strength,  Increased edema  Visit Diagnosis: Stiffness of right shoulder, not elsewhere classified  Shoulder stiffness, right  Postmastectomy lymphedema     Problem List Patient Active Problem List   Diagnosis Date Noted  . Lung cancer, right upper lobe ([place]) 11/30/2015  . S/P lobectomy of lung 11/29/2015  . Adenoma of liver 11/06/2015  . Genetic testing 08/11/2015  . Recurrent cancer of right breast ([place]) 06/05/2015  . Cancer of overlapping sites of right female breast ([place]) 03/16/2012    Prairie Stenberg 04/22/2016, 12:04 PM  [place], [place], [address] Phone: [phone]   Fax:  [phone]  Name: Crystal [name] MRN: [id number] Date of Birth: [birth year]    Serafina Royals, PT 04/22/2016 12:04 PM

## 2016-04-24 ENCOUNTER — Ambulatory Visit: Payer: BLUE CROSS/BLUE SHIELD | Admitting: Physical Therapy

## 2016-04-24 DIAGNOSIS — M25611 Stiffness of right shoulder, not elsewhere classified: Secondary | ICD-10-CM | POA: Diagnosis not present

## 2016-04-24 DIAGNOSIS — Z9011 Acquired absence of right breast and nipple: Secondary | ICD-10-CM | POA: Diagnosis not present

## 2016-04-24 DIAGNOSIS — I972 Postmastectomy lymphedema syndrome: Secondary | ICD-10-CM

## 2016-04-24 DIAGNOSIS — Z9189 Other specified personal risk factors, not elsewhere classified: Secondary | ICD-10-CM | POA: Diagnosis not present

## 2016-04-24 NOTE — Therapy (Signed)
[place], [place], [address] Phone: [phone]   Fax:  [phone]  Physical Therapy Treatment  Patient Details  Name: Crystal [name] MRN: [id number] Date of Birth: 1956-05-30 Referring Provider: Iver Nestle   Encounter Date: 04/24/2016      PT End of Session - 04/24/16 1433    Visit Number 5   Number of Visits 9   Date for PT Re-Evaluation 05/11/16   PT Start Time 1106   PT Stop Time 1152   PT Time Calculation (min) 46 min   Activity Tolerance Patient tolerated treatment well   Behavior During Therapy [facility] for tasks assessed/performed      Past Medical History  Diagnosis Date  . History of small bowel obstruction   . Liver masses   . Insomnia   . GERD (gastroesophageal reflux disease)   . Complication of anesthesia     hard to wake up  . Cancer [facility])     right breast  . Anxiety     Past Surgical History  Procedure Laterality Date  . Liver surgery    . Laparotomies    . Hernia repair      incisional hernia repair x3  . Breast surgery Right   . Mastectomy w/ sentinel node biopsy Right 07/05/2015    Procedure: RIGHT MASTECTOMY WITH SENTINEL NODE BIOPSY AND PORT A CATH INSERTION;  Surgeon: [name], MD;  Location: [place];  Service: General;  Laterality: Right;  . Portacath placement Left 07/05/2015    Procedure: INSERTION PORT-A-CATH;  Surgeon: [name], MD;  Location: [place];  Service: General;  Laterality: Left;  . Video assisted thoracoscopy (vats)/wedge resection Right 11/29/2015    Procedure: BRONCHOSCOPY , VIDEO ASSISTED THORACOSCOPY (VATS)/WEDGE RESECTION;  Surgeon: Grace Isaac, MD;  [facility];  Service: Thoracic;  Laterality: Right;  . Lobectomy Right 11/29/2015    Procedure: Completion Right Upper  LOBECTOMY, Lymph node dissection, on Q placement;  Surgeon: Grace Isaac, MD;  Location: [place];  Service:  Thoracic;  Laterality: Right;    There were no vitals filed for this visit.      Subjective Assessment - 04/24/16 1109    Subjective I think it feels looser, I think it feels smaller again.  I can tell a difference.   Currently in Pain? No/denies            [facility] PT Assessment - 04/24/16 0001    AROM   Right Shoulder Flexion 140 Degrees   Right Shoulder ABduction 150 Degrees                     [facility] Adult PT Treatment/Exercise - 04/24/16 0001    Manual Therapy   Soft tissue mobilization around right mastectomy incision in supine;   Myofascial Release crosshands technique at right chest in horizontal, vertical, and diagonal directions; in supine, right UE myofascial pulling and pulling with concurrent soft tissue mobilzation across right axilla and lateral chest tightness   Manual Lymphatic Drainage (MLD) in supine, diaphragmatic breathing, short neck, superficial abdomen, left axilla and anterior interaxillary anastomosis, right groin and axillo-inguinal anastomosis; in left sidelying, posterior interaxillary anastomosis and right axillo-inguinal anastomosis, with time spent at puffy areas at upper right flank   Passive ROM right shoulder for ER, abduction, and flexion  [facility] Goals - 04/24/16 1110    CC Long Term Goal  #1   Title Patient will be independent in self manual lymph drainage   Status On-going   CC Long Term Goal  #2   Title Patient will be know how to obtain and use compression garments for maintenance phase of treatment   Status Achieved   CC Long Term Goal  #3   Title Increase right shoulder abduction to >/= 150 degrees for increased ease reaching   Status Achieved   CC Long Term Goal  #5   Title Report she is able to reach overhead without complaints of increased pain in right axilla or shoulder.   Status Partially Met            Plan - 04/24/16 1433    Clinical Impression Statement  Patient notes improvement, albeit not dramatic.  She has met some goals, but still will benefit from therapy toward the rest of the goals.  She still has discomfort with reaching overhead, though this has decreased a little bit.  Right chest/axilla area appears less swollen.   Rehab Potential Good   Clinical Impairments Affecting Rehab Potential previous radiaton to right upper quadrant skin, ongoing treatment for lung cancer , osteoporosis    PT Frequency 2x / week   PT Duration 4 weeks   PT Treatment/Interventions ADLs/Self Care Home Management;Manual lymph drainage;Compression bandaging;Scar mobilization;Passive range of motion;Patient/family education;DME Instruction;Taping;Manual techniques;Therapeutic exercise   PT Next Visit Plan Repeat quick DASH.  Continue myofascial work and stretching to right shoulder; continue manual lymph drainage.   PT Home Exercise Plan continue walking, resume shoulder stretches   Consulted and Agree with Plan of Care Patient      Patient will benefit from skilled therapeutic intervention in order to improve the following deficits and impairments:  Decreased range of motion, Increased fascial restricitons, Impaired UE functional use, Decreased activity tolerance, Impaired perceived functional ability, Pain, Impaired flexibility, Decreased scar mobility, Decreased knowledge of use of DME, Decreased strength, Increased edema  Visit Diagnosis: Stiffness of right shoulder, not elsewhere classified  Shoulder stiffness, right  Postmastectomy lymphedema     Problem List Patient Active Problem List   Diagnosis Date Noted  . Lung cancer, right upper lobe ([place]) 11/30/2015  . S/P lobectomy of lung 11/29/2015  . Adenoma of liver 11/06/2015  . Genetic testing 08/11/2015  . Recurrent cancer of right breast ([place]) 06/05/2015  . Cancer of overlapping sites of right female breast [facility]) 03/16/2012    Matson Welch 04/24/2016, 2:37 PM  [name], [place], [address] Phone: [phone]   Fax:  [phone]  Name: Crystal [name] MRN: [id number] Date of Birth: October 15, 1955    Serafina Royals, PT 04/24/2016 2:37 PM

## 2016-04-29 ENCOUNTER — Ambulatory Visit: Payer: BLUE CROSS/BLUE SHIELD

## 2016-04-29 DIAGNOSIS — Z9011 Acquired absence of right breast and nipple: Secondary | ICD-10-CM | POA: Diagnosis not present

## 2016-04-29 DIAGNOSIS — M25611 Stiffness of right shoulder, not elsewhere classified: Secondary | ICD-10-CM | POA: Diagnosis not present

## 2016-04-29 DIAGNOSIS — Z9189 Other specified personal risk factors, not elsewhere classified: Secondary | ICD-10-CM | POA: Diagnosis not present

## 2016-04-29 DIAGNOSIS — I972 Postmastectomy lymphedema syndrome: Secondary | ICD-10-CM | POA: Diagnosis not present

## 2016-04-29 NOTE — Therapy (Signed)
[place], [place], [address] Phone: [phone]   Fax:  [phone]  Physical Therapy Treatment  Patient Details  Name: Crystal [name] MRN: [id number] Date of Birth: 1956/08/26 Referring Provider: Iver Nestle   Encounter Date: 04/29/2016      PT End of Session - 04/29/16 0847    Visit Number 6   Number of Visits 9   Date for PT Re-Evaluation 05/11/16   PT Start Time 0804   PT Stop Time 7673   PT Time Calculation (min) 43 min   Activity Tolerance Patient tolerated treatment well   Behavior During Therapy [facility] for tasks assessed/performed      Past Medical History  Diagnosis Date  . History of small bowel obstruction   . Liver masses   . Insomnia   . GERD (gastroesophageal reflux disease)   . Complication of anesthesia     hard to wake up  . Cancer [facility])     right breast  . Anxiety     Past Surgical History  Procedure Laterality Date  . Liver surgery    . Laparotomies    . Hernia repair      incisional hernia repair x3  . Breast surgery Right   . Mastectomy w/ sentinel node biopsy Right 07/05/2015    Procedure: RIGHT MASTECTOMY WITH SENTINEL NODE BIOPSY AND PORT A CATH INSERTION;  Surgeon: [name], MD;  Location: [place];  Service: General;  Laterality: Right;  . Portacath placement Left 07/05/2015    Procedure: INSERTION PORT-A-CATH;  Surgeon: [name], MD;  Location: [place];  Service: General;  Laterality: Left;  . Video assisted thoracoscopy (vats)/wedge resection Right 11/29/2015    Procedure: BRONCHOSCOPY , VIDEO ASSISTED THORACOSCOPY (VATS)/WEDGE RESECTION;  Surgeon: Grace Isaac, MD;  [facility];  Service: Thoracic;  Laterality: Right;  . Lobectomy Right 11/29/2015    Procedure: Completion Right Upper  LOBECTOMY, Lymph node dissection, on Q placement;  Surgeon: Grace Isaac, MD;  Location: [place];  Service:  Thoracic;  Laterality: Right;    There were no vitals filed for this visit.      Subjective Assessment - 04/29/16 0807    Subjective I haven't been here since Wednesday and I can tell my Rt side has filled up with fluid some since then.    Pertinent History right breast lumpectomy with 3 nodes removed  6/20112, recurrance with right mastectomy 06/2015 with 4nodes removed, wedge resection for upper right lobe removed in 11/2105 port removed5/15//2017.  she had had pain in axilla througout this time that was relieved when pocket of fluid was drained at time of port removal  She has been told she has osteoporosis    Patient Stated Goals to rid of pain and fluid in armpit and regain the range of motion    Currently in Pain? No/denies                         [facility] Adult PT Treatment/Exercise - 04/29/16 0001    Manual Therapy   Soft tissue mobilization around right mastectomy incision in supine;   Myofascial Release crosshands technique at right chest in horizontal, vertical, and diagonal directions; in supine, right UE myofascial pulling and pulling with concurrent soft tissue mobilzation across right axilla and lateral chest tightness   Manual Lymphatic Drainage (MLD) in supine, diaphragmatic breathing, short neck, superficial abdomen, left axilla and anterior interaxillary anastomosis,  right groin and axillo-inguinal anastomosis; in left sidelying, posterior interaxillary anastomosis and right axillo-inguinal anastomosis, with time spent at puffy areas at upper right flank   Passive ROM right shoulder for ER, abduction, and flexion                        Long Term Clinic Goals - 04/24/16 1110    CC Long Term Goal  #1   Title Patient will be independent in self manual lymph drainage   Status On-going   CC Long Term Goal  #2   Title Patient will be know how to obtain and use compression garments for maintenance phase of treatment   Status Achieved   CC Long  Term Goal  #3   Title Increase right shoulder abduction to >/= 150 degrees for increased ease reaching   Status Achieved   CC Long Term Goal  #5   Title Report she is able to reach overhead without complaints of increased pain in right axilla or shoulder.   Status Partially Met            Plan - 04/29/16 0848    Clinical Impression Statement Pt hasn't been seen here since last Wednesday and she reported overall did well. Did not notice her lypmhedema swelling back until about yesterday and chest tightness increased around the same time as well. She is continuing to stretch at home, though still struggles with ADLs due to tightness and some residual weakness (like getting dinner plates down from top shelf). Pt will benefit from continuing therapy at this time to work towards increase flexibility of Rt UE.    Rehab Potential Good   Clinical Impairments Affecting Rehab Potential previous radiaton to right upper quadrant skin, ongoing treatment for lung cancer , osteoporosis    PT Frequency 2x / week   PT Duration 4 weeks   PT Treatment/Interventions ADLs/Self Care Home Management;Manual lymph drainage;Compression bandaging;Scar mobilization;Passive range of motion;Patient/family education;DME Instruction;Taping;Manual techniques;Therapeutic exercise   PT Next Visit Plan Repeat quick DASH/assess goals.  Continue myofascial work and stretching to right shoulder; continue manual lymph drainage.   Consulted and Agree with Plan of Care Patient      Patient will benefit from skilled therapeutic intervention in order to improve the following deficits and impairments:  Decreased range of motion, Increased fascial restricitons, Impaired UE functional use, Decreased activity tolerance, Impaired perceived functional ability, Pain, Impaired flexibility, Decreased scar mobility, Decreased knowledge of use of DME, Decreased strength, Increased edema  Visit Diagnosis: Stiffness of right shoulder, not  elsewhere classified  Shoulder stiffness, right  Postmastectomy lymphedema  At risk for lymphedema  S/P mastectomy, right     Problem List Patient Active Problem List   Diagnosis Date Noted  . Lung cancer, right upper lobe ([place]) 11/30/2015  . S/P lobectomy of lung 11/29/2015  . Adenoma of liver 11/06/2015  . Genetic testing 08/11/2015  . Recurrent cancer of right breast ([place]) 06/05/2015  . Cancer of overlapping sites of right female breast ([place]) 03/16/2012    Otelia Limes, PTA 04/29/2016, 8:50 AM  [place], [place], [address] Phone: [phone]   Fax:  [phone]  Name: Crystal [name] MRN: [id number] Date of Birth: 02-05-1956

## 2016-05-01 ENCOUNTER — Ambulatory Visit: Payer: BLUE CROSS/BLUE SHIELD | Admitting: Physical Therapy

## 2016-05-01 DIAGNOSIS — M25611 Stiffness of right shoulder, not elsewhere classified: Secondary | ICD-10-CM

## 2016-05-01 DIAGNOSIS — I972 Postmastectomy lymphedema syndrome: Secondary | ICD-10-CM | POA: Diagnosis not present

## 2016-05-01 DIAGNOSIS — Z9189 Other specified personal risk factors, not elsewhere classified: Secondary | ICD-10-CM | POA: Diagnosis not present

## 2016-05-01 DIAGNOSIS — Z9011 Acquired absence of right breast and nipple: Secondary | ICD-10-CM | POA: Diagnosis not present

## 2016-05-01 NOTE — Therapy (Signed)
[place], [place], [address] Phone: [phone]   Fax:  [phone]  Physical Therapy Treatment  Patient Details  Name: KNIYAH KHUN MRN: [id number] Date of Birth: 07-Nov-1955 Referring Provider: Iver Nestle   Encounter Date: 05/01/2016      PT End of Session - 05/01/16 1237    Visit Number 7   Number of Visits 9   Date for PT Re-Evaluation 05/11/16   PT Start Time 0847   PT Stop Time 0930   PT Time Calculation (min) 43 min   Activity Tolerance Patient tolerated treatment well   Behavior During Therapy [facility] for tasks assessed/performed      Past Medical History  Diagnosis Date  . History of small bowel obstruction   . Liver masses   . Insomnia   . GERD (gastroesophageal reflux disease)   . Complication of anesthesia     hard to wake up  . Cancer [facility])     right breast  . Anxiety     Past Surgical History  Procedure Laterality Date  . Liver surgery    . Laparotomies    . Hernia repair      incisional hernia repair x3  . Breast surgery Right   . Mastectomy w/ sentinel node biopsy Right 07/05/2015    Procedure: RIGHT MASTECTOMY WITH SENTINEL NODE BIOPSY AND PORT A CATH INSERTION;  Surgeon: [name], MD;  Location: [place];  Service: General;  Laterality: Right;  . Portacath placement Left 07/05/2015    Procedure: INSERTION PORT-A-CATH;  Surgeon: [name], MD;  Location: [place];  Service: General;  Laterality: Left;  . Video assisted thoracoscopy (vats)/wedge resection Right 11/29/2015    Procedure: BRONCHOSCOPY , VIDEO ASSISTED THORACOSCOPY (VATS)/WEDGE RESECTION;  Surgeon: Grace Isaac, MD;  [facility];  Service: Thoracic;  Laterality: Right;  . Lobectomy Right 11/29/2015    Procedure: Completion Right Upper  LOBECTOMY, Lymph node dissection, on Q placement;  Surgeon: Grace Isaac, MD;  Location: [place];  Service:  Thoracic;  Laterality: Right;    There were no vitals filed for this visit.      Subjective Assessment - 05/01/16 0853    Subjective I can tell that we are making some headways with it.  It is not as tight or as painful    Pertinent History right breast lumpectomy with 3 nodes removed  6/20112, recurrance with right mastectomy 06/2015 with 4nodes removed, wedge resection for upper right lobe removed in 11/2105 port removed5/15//2017.  she had had pain in axilla througout this time that was relieved when pocket of fluid was drained at time of port removal  She has been told she has osteoporosis    Patient Stated Goals to rid of pain and fluid in armpit and regain the range of motion    Currently in Pain? No/denies                         [facility] Adult PT Treatment/Exercise - 05/01/16 0001    Self-Care   Other Self-Care Comments  issued lined foam in thin stockinette to wear inside bra at area of fullness    Manual Therapy   Soft tissue mobilization around right mastectomy incision in supine;   Myofascial Release crosshands technique at right chest in horizontal, vertical, and diagonal directions; in supine, right UE myofascial pulling and pulling with concurrent soft tissue mobilzation across right axilla and  lateral chest tightness   Manual Lymphatic Drainage (MLD) in supine, diaphragmatic breathing, short neck, superficial abdomen, left axilla and anterior interaxillary anastomosis, right groin and axillo-inguinal anastomosis; in left sidelying, posterior interaxillary anastomosis and right axillo-inguinal anastomosis, with time spent at puffy areas at upper right flank   Passive ROM right shoulder for ER, abduction, and flexion                        [facility] Goals - 04/24/16 1110    CC Long Term Goal  #1   Title Patient will be independent in self manual lymph drainage   Status On-going   CC Long Term Goal  #2   Title Patient will be know how to  obtain and use compression garments for maintenance phase of treatment   Status Achieved   CC Long Term Goal  #3   Title Increase right shoulder abduction to >/= 150 degrees for increased ease reaching   Status Achieved   CC Long Term Goal  #5   Title Report she is able to reach overhead without complaints of increased pain in right axilla or shoulder.   Status Partially Met            Plan - 05/01/16 1237    Clinical Impression Statement pt reports she is seeing progress but continues to have fullness and pain at lateral incision line with decreased tolerance to myofascial stretching to this area.  Upgraded foam patch today to see if more compression will help.  If pt is able to tolerate it, she may benefit from trial of large dotted foam    Clinical Impairments Affecting Rehab Potential previous radiaton to right upper quadrant skin, ongoing treatment for lung cancer , osteoporosis    PT Next Visit Plan Repeat quick DASH/assess goals.  Continue myofascial work and stretching to right shoulder; continue manual lymph drainage.      Patient will benefit from skilled therapeutic intervention in order to improve the following deficits and impairments:  Decreased range of motion, Increased fascial restricitons, Impaired UE functional use, Decreased activity tolerance, Impaired perceived functional ability, Pain, Impaired flexibility, Decreased scar mobility, Decreased knowledge of use of DME, Decreased strength, Increased edema  Visit Diagnosis: Stiffness of right shoulder, not elsewhere classified  Postmastectomy lymphedema     Problem List Patient Active Problem List   Diagnosis Date Noted  . Lung cancer, right upper lobe ([place]) 11/30/2015  . S/P lobectomy of lung 11/29/2015  . Adenoma of liver 11/06/2015  . Genetic testing 08/11/2015  . Recurrent cancer of right breast ([place]) 06/05/2015  . Cancer of overlapping sites of right female breast ([place]) 03/16/2012     Donato Heinz.  Owens Shark PT   Norwood Levo 05/01/2016, 12:46 PM  [place], [place], [address] Phone: [phone]   Fax:  [phone]  Name: MARISUE CANION MRN: [id number] Date of Birth: [birth year]

## 2016-05-03 ENCOUNTER — Ambulatory Visit: Payer: BLUE CROSS/BLUE SHIELD | Admitting: Physical Therapy

## 2016-05-03 DIAGNOSIS — M25611 Stiffness of right shoulder, not elsewhere classified: Secondary | ICD-10-CM | POA: Diagnosis not present

## 2016-05-03 DIAGNOSIS — I972 Postmastectomy lymphedema syndrome: Secondary | ICD-10-CM

## 2016-05-03 DIAGNOSIS — Z9189 Other specified personal risk factors, not elsewhere classified: Secondary | ICD-10-CM | POA: Diagnosis not present

## 2016-05-03 DIAGNOSIS — Z9011 Acquired absence of right breast and nipple: Secondary | ICD-10-CM | POA: Diagnosis not present

## 2016-05-03 NOTE — Patient Instructions (Addendum)
  Do everything 5-7 times     Start with deep breathing. Think about slowly filling and releasing your abdomen.     Circles on right groin.  Hand superglued to skin and GENTLY stretch skin on side of abdomen under scar toward groin   Circles on left armpit gently stretch skin across chest toward left armpit   Right Hand over head, arm  resting pillow  Gently stretch skin on armpit and area above scar trying to get skin movement  ------------------------------------------------------------------------------------------------------------------------------------------------------------  Hand lightly over top of scar, take up the tension in the skin, do not allow to slide.  Hold for about 2 minutes and follow along if skin starts to move

## 2016-05-03 NOTE — Therapy (Signed)
[place], [place], [address] Phone: [phone]   Fax:  [phone]  Physical Therapy Treatment  Patient Details  Name: Crystal [name] MRN: [id number] Date of Birth: 1955/10/25 Referring Provider: Iver Nestle   Encounter Date: 05/03/2016      PT End of Session - 05/03/16 1304    Visit Number 8   Number of Visits 9   Date for PT Re-Evaluation 05/11/16   PT Start Time 0845   PT Stop Time 0930   PT Time Calculation (min) 45 min   Activity Tolerance Patient tolerated treatment well   Behavior During Therapy [facility] for tasks assessed/performed      Past Medical History  Diagnosis Date  . History of small bowel obstruction   . Liver masses   . Insomnia   . GERD (gastroesophageal reflux disease)   . Complication of anesthesia     hard to wake up  . Cancer [facility])     right breast  . Anxiety     Past Surgical History  Procedure Laterality Date  . Liver surgery    . Laparotomies    . Hernia repair      incisional hernia repair x3  . Breast surgery Right   . Mastectomy w/ sentinel node biopsy Right 07/05/2015    Procedure: RIGHT MASTECTOMY WITH SENTINEL NODE BIOPSY AND PORT A CATH INSERTION;  Surgeon: [name], MD;  Location: [place];  Service: General;  Laterality: Right;  . Portacath placement Left 07/05/2015    Procedure: INSERTION PORT-A-CATH;  Surgeon: [name], MD;  Location: [place];  Service: General;  Laterality: Left;  . Video assisted thoracoscopy (vats)/wedge resection Right 11/29/2015    Procedure: BRONCHOSCOPY , VIDEO ASSISTED THORACOSCOPY (VATS)/WEDGE RESECTION;  Surgeon: Grace Isaac, MD;  [facility];  Service: Thoracic;  Laterality: Right;  . Lobectomy Right 11/29/2015    Procedure: Completion Right Upper  LOBECTOMY, Lymph node dissection, on Q placement;  Surgeon: Grace Isaac, MD;  Location: [place];  Service:  Thoracic;  Laterality: Right;    There were no vitals filed for this visit.      Subjective Assessment - 05/03/16 0901    Subjective pt says she did not feel well after last treatment,but that she feels OK  today.  Pt comes in wearing lined foam on right lateral chest, but it does not stay up high enough to apply compression to lateral wound.    Pertinent History right breast lumpectomy with 3 nodes removed  6/20112, recurrance with right mastectomy 06/2015 with 4nodes removed, wedge resection for upper right lobe removed in 11/2105 port removed5/15//2017.  she had had pain in axilla througout this time that was relieved when pocket of fluid was drained at time of port removal  She has been told she has osteoporosis    Patient Stated Goals to rid of pain and fluid in armpit and regain the range of motion    Currently in Pain? No/denies            [facility] PT Assessment - 05/03/16 0001    Observation/Other Assessments   Observations pt with visible band of fascia/cording from superior scar toward axilla that is painful to touch                      [facility] Adult PT Treatment/Exercise - 05/03/16 0001    Lumbar Exercises: Supine   Other Supine Lumbar Exercises single  knees to chest for core activation    Shoulder Exercises: Sidelying   External Rotation AROM;Right;10 reps   Other Sidelying Exercises horizontalabductionwith backward upper thoracic rotation    Other Sidelying Exercises hand up to ceiling for small controlled circles   Shoulder Exercises: Stretch   Other Shoulder Stretches in supine hooklying with arms outstretched, lower trunk rotation to left for right chest stretch   Manual Therapy   Myofascial Release instructed in self technique at lateral incision.    Manual Lymphatic Drainage (MLD) pt instructed in self technique today in supine, with emphasis on right lateral trunk .. diaphragmatic breathing, right groin, right lateral trunk, left axillay nodes, anterior  interaxillay anastamosis, and right axilla, hand over hand technique used and written handout issued                 PT Education - 05/03/16 1301    Education provided Yes   Education Details self manual lymph drainage and myofascial release    Person(s) Educated Patient   Methods Explanation;Demonstration;Tactile cues;Verbal cues;Handout   Comprehension Verbalized understanding;Returned demonstration                [place] Goals - 04/24/16 1110    CC Long Term Goal  #1   Title Patient will be independent in self manual lymph drainage   Status On-going   CC Long Term Goal  #2   Title Patient will be know how to obtain and use compression garments for maintenance phase of treatment   Status Achieved   CC Long Term Goal  #3   Title Increase right shoulder abduction to >/= 150 degrees for increased ease reaching   Status Achieved   CC Long Term Goal  #5   Title Report she is able to reach overhead without complaints of increased pain in right axilla or shoulder.   Status Partially Met            Plan - 05/03/16 1304    Clinical Impression Statement Treatment today focused on self instruction of manual lymph drainage and myofacial release.  She had tenderness at facial band/ cord at anterior chest superior to mastectomy scar at end of treatment today.  She was encouraged to keep comprssion foam up higher so that is would cover this area. She felt that she would be able to continue treatment at home for the next week    Rehab Potential Good   Clinical Impairments Affecting Rehab Potential previous radiaton to right upper quadrant skin, ongoing treatment for lung cancer , osteoporosis    PT Frequency 2x / week   PT Duration 4 weeks   PT Treatment/Interventions ADLs/Self Care Home Management;Manual lymph drainage;Compression bandaging;Scar mobilization;Passive range of motion;Patient/family education;DME Instruction;Taping;Manual techniques;Therapeutic  exercise   PT Next Visit Plan Check to see if patient feels comfortalbe with self lymph drianage Repeat quick DASH/assess goals.  Continue myofascial work and stretching to right shoulder; continue manual lymph drainage.   Consulted and Agree with Plan of Care Patient      Patient will benefit from skilled therapeutic intervention in order to improve the following deficits and impairments:  Decreased range of motion, Increased fascial restricitons, Impaired UE functional use, Decreased activity tolerance, Impaired perceived functional ability, Pain, Impaired flexibility, Decreased scar mobility, Decreased knowledge of use of DME, Decreased strength, Increased edema  Visit Diagnosis: Stiffness of right shoulder, not elsewhere classified  Postmastectomy lymphedema  Shoulder stiffness, right     Problem List Patient Active Problem List  Diagnosis Date Noted  . Lung cancer, right upper lobe ([place]) 11/30/2015  . S/P lobectomy of lung 11/29/2015  . Adenoma of liver 11/06/2015  . Genetic testing 08/11/2015  . Recurrent cancer of right breast ([place]) 06/05/2015  . Cancer of overlapping sites of right female breast ([place]) 03/16/2012   Donato Heinz. Owens Shark PT   Norwood Levo 05/03/2016, 1:09 PM  [place], [place], [address] Phone: [phone]   Fax:  [phone]  Name: MONICIA TSE MRN: [id number] Date of Birth: 09/15/1956

## 2016-05-06 ENCOUNTER — Ambulatory Visit: Payer: BLUE CROSS/BLUE SHIELD

## 2016-05-06 ENCOUNTER — Other Ambulatory Visit: Payer: BLUE CROSS/BLUE SHIELD

## 2016-05-13 ENCOUNTER — Ambulatory Visit (HOSPITAL_BASED_OUTPATIENT_CLINIC_OR_DEPARTMENT_OTHER): Payer: BLUE CROSS/BLUE SHIELD

## 2016-05-13 ENCOUNTER — Other Ambulatory Visit (HOSPITAL_BASED_OUTPATIENT_CLINIC_OR_DEPARTMENT_OTHER): Payer: BLUE CROSS/BLUE SHIELD

## 2016-05-13 VITALS — BP 129/72 | HR 68 | Temp 98.2°F | Resp 18

## 2016-05-13 DIAGNOSIS — C50412 Malignant neoplasm of upper-outer quadrant of left female breast: Secondary | ICD-10-CM | POA: Diagnosis not present

## 2016-05-13 DIAGNOSIS — C50911 Malignant neoplasm of unspecified site of right female breast: Secondary | ICD-10-CM

## 2016-05-13 DIAGNOSIS — C50811 Malignant neoplasm of overlapping sites of right female breast: Secondary | ICD-10-CM

## 2016-05-13 DIAGNOSIS — Z5111 Encounter for antineoplastic chemotherapy: Secondary | ICD-10-CM

## 2016-05-13 LAB — CBC WITH DIFFERENTIAL/PLATELET
BASO%: 0.7 % (ref 0.0–2.0)
BASOS ABS: 0 10*3/uL (ref 0.0–0.1)
EOS%: 1.3 % (ref 0.0–7.0)
Eosinophils Absolute: 0.1 10*3/uL (ref 0.0–0.5)
HEMATOCRIT: 41.1 % (ref 34.8–46.6)
HEMOGLOBIN: 13.4 g/dL (ref 11.6–15.9)
LYMPH#: 1.7 10*3/uL (ref 0.9–3.3)
LYMPH%: 29.4 % (ref 14.0–49.7)
MCH: 28.2 pg (ref 25.1–34.0)
MCHC: 32.6 g/dL (ref 31.5–36.0)
MCV: 86.3 fL (ref 79.5–101.0)
MONO#: 0.3 10*3/uL (ref 0.1–0.9)
MONO%: 5 % (ref 0.0–14.0)
NEUT%: 63.6 % (ref 38.4–76.8)
NEUTROS ABS: 3.7 10*3/uL (ref 1.5–6.5)
Platelets: 216 10*3/uL (ref 145–400)
RBC: 4.77 10*6/uL (ref 3.70–5.45)
RDW: 13.9 % (ref 11.2–14.5)
WBC: 5.8 10*3/uL (ref 3.9–10.3)

## 2016-05-13 LAB — COMPREHENSIVE METABOLIC PANEL
ALBUMIN: 4 g/dL (ref 3.5–5.0)
ALK PHOS: 153 U/L — AB (ref 40–150)
ALT: 31 U/L (ref 0–55)
AST: 23 U/L (ref 5–34)
Anion Gap: 11 mEq/L (ref 3–11)
BILIRUBIN TOTAL: 0.34 mg/dL (ref 0.20–1.20)
BUN: 14.7 mg/dL (ref 7.0–26.0)
CALCIUM: 9.9 mg/dL (ref 8.4–10.4)
CO2: 25 mEq/L (ref 22–29)
CREATININE: 0.7 mg/dL (ref 0.6–1.1)
Chloride: 104 mEq/L (ref 98–109)
EGFR: 87 mL/min/{1.73_m2} — ABNORMAL LOW (ref >90)
GLUCOSE: 86 mg/dL (ref 70–140)
POTASSIUM: 4.6 meq/L (ref 3.5–5.1)
SODIUM: 141 meq/L (ref 136–145)
TOTAL PROTEIN: 7.5 g/dL (ref 6.4–8.3)

## 2016-05-13 MED ORDER — FULVESTRANT 250 MG/5ML IM SOLN
500.0000 mg | Freq: Once | INTRAMUSCULAR | Status: AC
  Administered 2016-05-13: 500 mg via INTRAMUSCULAR
  Filled 2016-05-13: qty 10

## 2016-05-13 NOTE — Patient Instructions (Signed)

## 2016-05-15 ENCOUNTER — Ambulatory Visit: Payer: BLUE CROSS/BLUE SHIELD | Attending: Oncology | Admitting: Physical Therapy

## 2016-05-15 DIAGNOSIS — M25611 Stiffness of right shoulder, not elsewhere classified: Secondary | ICD-10-CM | POA: Diagnosis not present

## 2016-05-15 DIAGNOSIS — I972 Postmastectomy lymphedema syndrome: Secondary | ICD-10-CM | POA: Insufficient documentation

## 2016-05-15 NOTE — Therapy (Signed)
[place], [place], [address] Phone: [phone]   Fax:  [phone]  Physical Therapy Treatment  Patient Details  Name: Crystal [name] MRN: [id number] Date of Birth: 1956-07-06 Referring Provider: Iver Nestle   Encounter Date: 05/15/2016      PT End of Session - 05/15/16 2142    Visit Number 9   Number of Visits 15   Date for PT Re-Evaluation 06/15/16   PT Start Time 1110   PT Stop Time 1200   PT Time Calculation (min) 50 min   Activity Tolerance Patient tolerated treatment well   Behavior During Therapy [facility] for tasks assessed/performed      Past Medical History:  Diagnosis Date  . Anxiety   . Cancer ([place])    right breast  . Complication of anesthesia    hard to wake up  . GERD (gastroesophageal reflux disease)   . History of small bowel obstruction   . Insomnia   . Liver masses     Past Surgical History:  Procedure Laterality Date  . BREAST SURGERY Right   . HERNIA REPAIR     incisional hernia repair x3  . laparotomies    . LIVER SURGERY    . LOBECTOMY Right 11/29/2015   Procedure: Completion Right Upper  LOBECTOMY, Lymph node dissection, on Q placement;  Surgeon: Grace Isaac, MD;  Location: [place];  Service: Thoracic;  Laterality: Right;  . MASTECTOMY W/ SENTINEL NODE BIOPSY Right 07/05/2015   Procedure: RIGHT MASTECTOMY WITH SENTINEL NODE BIOPSY AND PORT A CATH INSERTION;  Surgeon: [name], MD;  Location: [place];  Service: General;  Laterality: Right;  . PORTACATH PLACEMENT Left 07/05/2015   Procedure: INSERTION PORT-A-CATH;  Surgeon: [name], MD;  Location: [place];  Service: General;  Laterality: Left;  [place] VIDEO ASSISTED THORACOSCOPY (VATS)/WEDGE RESECTION Right 11/29/2015   Procedure: BRONCHOSCOPY , VIDEO ASSISTED THORACOSCOPY (VATS)/WEDGE RESECTION;  Surgeon: Grace Isaac, MD;  Location: [place];  Service: Thoracic;   Laterality: Right;    There were no vitals filed for this visit.      Subjective Assessment - 05/15/16 1114    Subjective Just getting over a cold. [place].  Is unsure if she's doing the manual lymph drainage right.  Feels fuller because she took the week off.     Currently in Pain? No/denies            [facility] PT Assessment - 05/15/16 0001      AROM   Right Shoulder Flexion 141 Degrees   Right Shoulder ABduction 134 Degrees                     [facility] Adult PT Treatment/Exercise - 05/15/16 0001      Manual Therapy   Soft tissue mobilization around right mastectomy incision in supine;   Myofascial Release crosshands technique at right chest in horizontal, vertical, and diagonal directions; in supine, right UE myofascial pulling and pulling with concurrent soft tissue mobilzation across right axilla and lateral chest tightness   Manual Lymphatic Drainage (MLD) in supine, diaphragmatic breathing, short neck, superficial abdomen, left axilla and anterior interaxillary anastomosis, right groin and axillo-inguinal anastomosis; in left sidelying, posterior interaxillary anastomosis and right axillo-inguinal anastomosis, with time spent at puffy areas at upper right flank; verbal review done as this was performed   Passive ROM right shoulder for ER, abduction, and flexion  [facility] Goals - 05/15/16 1123      CC Long Term Goal  #1   Title Patient will be independent in self manual lymph drainage   Status On-going     CC Long Term Goal  #2   Title Patient will be know how to obtain and use compression garments for maintenance phase of treatment   Status Achieved     CC Long Term Goal  #3   Title Increase right shoulder abduction to >/= 150 degrees for increased ease reaching   Status Achieved     CC Long Term Goal  #4   Title Patient will decrease the DASH score to <39    to demonstrate  increased functional use of upper extremity   Status On-going     CC Long Term Goal  #5   Title Report she is able to reach overhead without complaints of increased pain in right axilla or shoulder.   Status Partially Met            Plan - 05/15/16 2143    Clinical Impression Statement Patient does not yet feel confident with self-manual lymph drainage, but benefitted from review today as this was done for her.  She has decreased slightly in AROM, but feels this may be due to having been sick in bed for five days and not able to do her HEP fully.  She still has some discomfort with reaching overhead, but mainly when lifting something.   Rehab Potential Good   Clinical Impairments Affecting Rehab Potential previous radiaton to right upper quadrant skin, ongoing treatment for lung cancer , osteoporosis    PT Frequency 2x / week  this week, then 1x/week x 2-4 weeks   PT Treatment/Interventions ADLs/Self Care Home Management;Patient/family education;Manual lymph drainage;Manual techniques;Passive range of motion   PT Next Visit Plan Repeat quick DASH; assess comfort level with manual lymph drainage and possibly review.  Continue manual work.   Consulted and Agree with Plan of Care Patient      Patient will benefit from skilled therapeutic intervention in order to improve the following deficits and impairments:  Decreased range of motion, Increased fascial restricitons, Impaired UE functional use, Decreased activity tolerance, Impaired perceived functional ability, Pain, Impaired flexibility, Decreased scar mobility, Decreased knowledge of use of DME, Decreased strength, Increased edema  Visit Diagnosis: Stiffness of right shoulder, not elsewhere classified - Plan: PT plan of care cert/re-cert  Postmastectomy lymphedema - Plan: PT plan of care cert/re-cert     Problem List Patient Active Problem List   Diagnosis Date Noted  . Lung cancer, right upper lobe ([place]) 11/30/2015  . S/P  lobectomy of lung 11/29/2015  . Adenoma of liver 11/06/2015  . Genetic testing 08/11/2015  . Recurrent cancer of right breast ([place]) 06/05/2015  . Cancer of overlapping sites of right female breast [facility]) 03/16/2012    Connor Foxworthy 05/15/2016, 9:52 PM  [place], [place], [address] Phone: [phone]   Fax:  [phone]  Name: Crystal [name] MRN: [id number] Date of Birth: [birth year]   Serafina Royals, PT 05/15/16 9:52 PM

## 2016-05-17 ENCOUNTER — Ambulatory Visit: Payer: BLUE CROSS/BLUE SHIELD | Admitting: Physical Therapy

## 2016-05-17 DIAGNOSIS — I972 Postmastectomy lymphedema syndrome: Secondary | ICD-10-CM

## 2016-05-17 DIAGNOSIS — M25611 Stiffness of right shoulder, not elsewhere classified: Secondary | ICD-10-CM | POA: Diagnosis not present

## 2016-05-17 NOTE — Patient Instructions (Signed)
Self manual lymph drainage: Perform this sequence once a day.  Only give enough pressure no your skin to make the skin move.  Diaphragmatic - Supine   Inhale through nose making navel move out toward hands. Exhale through puckered lips, hands follow navel in. Repeat _5__ times. Rest _10__ seconds between repeats.   Copyright  VHI. All rights reserved.  Hug yourself.  Do circles at your neck just above your collarbones.  Repeat this 10 times.  Axilla - One at a Time   Using full weight of flat hand and fingers at center of uninvolved armpit, make _10__ in-place circles.   Copyright  VHI. All rights reserved.  LEG: Inguinal Nodes Stimulation   With small finger side of hand against hip crease on involved side, gently perform circles at the crease. Repeat __10_ times.   Copyright  VHI. All rights reserved.  1) Axilla to Inguinal Nodes - Sweep   On involved side, sweep _4__ times from armpit along side of trunk to hip crease.  Now gently stretch skin from the involved side to the uninvolved side across the chest at the shoulder line.  Repeat that 4 times.  Work any swelling at the right chest toward the pathways across your upper chest and down the right side.  Finish by doing the pathways as described above going from your involved armpit to the same side groin and going across your upper chest from the involved shoulder to the uninvolved shoulder.  Repeat the steps above where you do circles in your left groin and right armpit.   Then place left hand on scar areas at right chest. Have that hand stick like super glue, and move the hand enough to pick up any slack in the soft tissue.  Hold at that point, stretching the soft tissue, for several minutes.  If the tissue feels like it releases (or stretches), keep your hand in place and just pick up the slack from that stretch, continuing to put pressure on the tissue. Copyright  VHI. All rights reserved.

## 2016-05-17 NOTE — Therapy (Signed)
[place], [place], [address] Phone: [phone]   Fax:  [phone]  Physical Therapy Treatment  Patient Details  Name: Crystal [name] MRN: [id number] Date of Birth: [birth year] Referring Provider: Iver Nestle   Encounter Date: 05/17/2016      PT End of Session - 05/17/16 1215    Visit Number 10   Number of Visits 15   Date for PT Re-Evaluation 06/15/16   PT Start Time 0937   PT Stop Time 1020   PT Time Calculation (min) 43 min   Activity Tolerance Patient tolerated treatment well   Behavior During Therapy [facility] for tasks assessed/performed      Past Medical History:  Diagnosis Date  . Anxiety   . Cancer ([place])    right breast  . Complication of anesthesia    hard to wake up  . GERD (gastroesophageal reflux disease)   . History of small bowel obstruction   . Insomnia   . Liver masses     Past Surgical History:  Procedure Laterality Date  . BREAST SURGERY Right   . HERNIA REPAIR     incisional hernia repair x3  . laparotomies    . LIVER SURGERY    . LOBECTOMY Right 11/29/2015   Procedure: Completion Right Upper  LOBECTOMY, Lymph node dissection, on Q placement;  Surgeon: Grace Isaac, MD;  Location: [place];  Service: Thoracic;  Laterality: Right;  . MASTECTOMY W/ SENTINEL NODE BIOPSY Right 07/05/2015   Procedure: RIGHT MASTECTOMY WITH SENTINEL NODE BIOPSY AND PORT A CATH INSERTION;  Surgeon: [name], MD;  Location: [place];  Service: General;  Laterality: Right;  . PORTACATH PLACEMENT Left 07/05/2015   Procedure: INSERTION PORT-A-CATH;  Surgeon: [name], MD;  Location: [place];  Service: General;  Laterality: Left;  Marland Kitchen VIDEO ASSISTED THORACOSCOPY (VATS)/WEDGE RESECTION Right 11/29/2015   Procedure: BRONCHOSCOPY , VIDEO ASSISTED THORACOSCOPY (VATS)/WEDGE RESECTION;  Surgeon: Grace Isaac, MD;  Location: [place];  Service:  Thoracic;  Laterality: Right;    There were no vitals filed for this visit.      Subjective Assessment - 05/17/16 0939    Subjective Could not get that jar open last night.  A little soreness after last session, but it's gone today.   Currently in Pain? No/denies            [facility] PT Assessment - 05/17/16 0001      Observation/Other Assessments   Quick DASH  36.36              Quick Dash - 05/17/16 0001    Open a tight or new jar Moderate difficulty   Do heavy household chores (wash walls, wash floors) Severe difficulty   Carry a shopping bag or briefcase Mild difficulty   Wash your back Moderate difficulty   Use a knife to cut food No difficulty   Recreational activities in which you take some force or impact through your arm, shoulder, or hand (golf, hammering, tennis) Severe difficulty   During the past week, to what extent has your arm, shoulder or hand problem interfered with your normal social activities with family, friends, neighbors, or groups? Modererately   During the past week, to what extent has your arm, shoulder or hand problem limited your work or other regular daily activities Slightly   Arm, shoulder, or hand pain. Mild   Tingling (pins and needles) in your arm, shoulder, or hand None  Difficulty Sleeping Mild difficulty   DASH Score 36.36 %               [facility] Adult PT Treatment/Exercise - 05/17/16 0001      Self-Care   Other Self-Care Comments  reviewed self-manual lymph drainage and self-scar mobilization     Manual Therapy   Soft tissue mobilization around right mastectomy incision in supine; reviewed with patient doing this herself   Myofascial Release releases at right axilla area of tightness in supine and left sidelying   Scapular Mobilization in left sidelying for right scapula   Manual Lymphatic Drainage (MLD) Reviewed with patient doing self manual lymph drainage:  diaphragmatic breathing, short neck, left axilla and anterior  interaxillary anastomosis, right groin and axillo-inguinal anastomosis, and area around incisions at right chest.                PT Education - 05/17/16 1215    Education provided Yes   Education Details self-manual lymph drainage and myofascial release   Person(s) Educated Patient   Methods Explanation;Demonstration;Tactile cues;Verbal cues;Handout   Comprehension Verbalized understanding;Returned demonstration                [facility] Goals - 05/17/16 1214      CC Long Term Goal  #4   Title Patient will decrease the DASH score to <39    to demonstrate increased functional use of upper extremity   Baseline 54.55; 36.36 on 05/17/16   Status Achieved            Plan - 05/17/16 1216    Clinical Impression Statement Did well with self-manual lymph drainage today; therapist made small corrections and changes and another handout given.  Quick DASH score reduction goal met today.  Will continue therapy 1x.week x 2-4 weeks prn.   Rehab Potential Good   Clinical Impairments Affecting Rehab Potential previous radiaton to right upper quadrant skin, ongoing treatment for lung cancer , osteoporosis    PT Frequency 1x / week   PT Duration 4 weeks   PT Treatment/Interventions ADLs/Self Care Home Management;Patient/family education;Manual lymph drainage;Manual techniques;Passive range of motion   PT Next Visit Plan Review manual lymph drainage as needed; continue manual work; check HEP, check goals   PT Home Exercise Plan continue walking, resume shoulder stretches   Consulted and Agree with Plan of Care Patient      Patient will benefit from skilled therapeutic intervention in order to improve the following deficits and impairments:  Decreased range of motion, Increased fascial restricitons, Impaired UE functional use, Decreased activity tolerance, Impaired perceived functional ability, Pain, Impaired flexibility, Decreased scar mobility, Decreased knowledge of use of  DME, Decreased strength, Increased edema  Visit Diagnosis: Stiffness of right shoulder, not elsewhere classified  Postmastectomy lymphedema     Problem List Patient Active Problem List   Diagnosis Date Noted  . Lung cancer, right upper lobe ([place]) 11/30/2015  . S/P lobectomy of lung 11/29/2015  . Adenoma of liver 11/06/2015  . Genetic testing 08/11/2015  . Recurrent cancer of right breast ([place]) 06/05/2015  . Cancer of overlapping sites of right female breast ([place]) 03/16/2012    SALISBURY,DONNA 05/17/2016, 12:19 PM  [place], [place], [address] Phone: [phone]   Fax:  [phone]  Name: Crystal [name] MRN: [id number] Date of Birth: 07-09-1956   Serafina Royals, PT 05/17/16 12:19 PM

## 2016-05-22 ENCOUNTER — Other Ambulatory Visit (HOSPITAL_BASED_OUTPATIENT_CLINIC_OR_DEPARTMENT_OTHER): Payer: BLUE CROSS/BLUE SHIELD

## 2016-05-22 ENCOUNTER — Ambulatory Visit: Payer: BLUE CROSS/BLUE SHIELD

## 2016-05-22 ENCOUNTER — Encounter: Payer: Self-pay | Admitting: Nurse Practitioner

## 2016-05-22 ENCOUNTER — Telehealth: Payer: Self-pay

## 2016-05-22 ENCOUNTER — Telehealth: Payer: Self-pay | Admitting: Nurse Practitioner

## 2016-05-22 ENCOUNTER — Ambulatory Visit (HOSPITAL_BASED_OUTPATIENT_CLINIC_OR_DEPARTMENT_OTHER): Payer: BLUE CROSS/BLUE SHIELD | Admitting: Nurse Practitioner

## 2016-05-22 VITALS — BP 107/66 | HR 72 | Temp 98.4°F | Resp 18 | Wt 135.1 lb

## 2016-05-22 DIAGNOSIS — C3490 Malignant neoplasm of unspecified part of unspecified bronchus or lung: Secondary | ICD-10-CM

## 2016-05-22 DIAGNOSIS — C50911 Malignant neoplasm of unspecified site of right female breast: Secondary | ICD-10-CM

## 2016-05-22 DIAGNOSIS — R21 Rash and other nonspecific skin eruption: Secondary | ICD-10-CM | POA: Diagnosis not present

## 2016-05-22 DIAGNOSIS — C50811 Malignant neoplasm of overlapping sites of right female breast: Secondary | ICD-10-CM

## 2016-05-22 LAB — COMPREHENSIVE METABOLIC PANEL
ALBUMIN: 4.1 g/dL (ref 3.5–5.0)
ALK PHOS: 147 U/L (ref 40–150)
ALT: 28 U/L (ref 0–55)
AST: 22 U/L (ref 5–34)
Anion Gap: 10 mEq/L (ref 3–11)
BILIRUBIN TOTAL: 0.33 mg/dL (ref 0.20–1.20)
BUN: 17.7 mg/dL (ref 7.0–26.0)
CO2: 27 mEq/L (ref 22–29)
Calcium: 9.8 mg/dL (ref 8.4–10.4)
Chloride: 103 mEq/L (ref 98–109)
Creatinine: 0.8 mg/dL (ref 0.6–1.1)
EGFR: 82 mL/min/{1.73_m2} — ABNORMAL LOW (ref >90)
GLUCOSE: 82 mg/dL (ref 70–140)
Potassium: 4.3 mEq/L (ref 3.5–5.1)
SODIUM: 140 meq/L (ref 136–145)
TOTAL PROTEIN: 7.5 g/dL (ref 6.4–8.3)

## 2016-05-22 LAB — CBC WITH DIFFERENTIAL/PLATELET
BASO%: 0.6 % (ref 0.0–2.0)
Basophils Absolute: 0 10*3/uL (ref 0.0–0.1)
EOS%: 1 % (ref 0.0–7.0)
Eosinophils Absolute: 0.1 10*3/uL (ref 0.0–0.5)
HCT: 41.7 % (ref 34.8–46.6)
HEMOGLOBIN: 13.5 g/dL (ref 11.6–15.9)
LYMPH%: 26.6 % (ref 14.0–49.7)
MCH: 27.7 pg (ref 25.1–34.0)
MCHC: 32.4 g/dL (ref 31.5–36.0)
MCV: 85.5 fL (ref 79.5–101.0)
MONO#: 0.4 10*3/uL (ref 0.1–0.9)
MONO%: 5.5 % (ref 0.0–14.0)
NEUT%: 66.3 % (ref 38.4–76.8)
NEUTROS ABS: 4.6 10*3/uL (ref 1.5–6.5)
Platelets: 222 10*3/uL (ref 145–400)
RBC: 4.88 10*6/uL (ref 3.70–5.45)
RDW: 13.7 % (ref 11.2–14.5)
WBC: 7 10*3/uL (ref 3.9–10.3)
lymph#: 1.9 10*3/uL (ref 0.9–3.3)

## 2016-05-22 NOTE — Telephone Encounter (Signed)
Added apt per pof °

## 2016-05-22 NOTE — Assessment & Plan Note (Signed)
Patient received her first monthly Faslodex injection on 05/14/2016.  She is scheduled to return for labs, visit, and her next injection on 06/10/2016.

## 2016-05-22 NOTE — Progress Notes (Signed)
SYMPTOM MANAGEMENT CLINIC    Chief Complaint: Rash  HPI:  Crystal [name] 60 y.o. female diagnosed with breast cancer.  Patient also has a history of lung cancer in the past as well.  Patient is currently undergoing Faslodex injections.  Patient presents to the [facility] today with a new complaint of rash to the right mastectomy site.    No history exists.    Review of Systems  Skin: Positive for rash. Negative for itching.  All other systems reviewed and are negative.   Past Medical History:  Diagnosis Date  . Anxiety   . Cancer ([place])    right breast  . Complication of anesthesia    hard to wake up  . GERD (gastroesophageal reflux disease)   . History of small bowel obstruction   . Insomnia   . Liver masses     Past Surgical History:  Procedure Laterality Date  . BREAST SURGERY Right   . HERNIA REPAIR     incisional hernia repair x3  . laparotomies    . LIVER SURGERY    . LOBECTOMY Right 11/29/2015   Procedure: Completion Right Upper  LOBECTOMY, Lymph node dissection, on Q placement;  Surgeon: Grace Isaac, MD;  Location: [place];  Service: Thoracic;  Laterality: Right;  . MASTECTOMY W/ SENTINEL NODE BIOPSY Right 07/05/2015   Procedure: RIGHT MASTECTOMY WITH SENTINEL NODE BIOPSY AND PORT A CATH INSERTION;  Surgeon: [name], MD;  Location: [place];  Service: General;  Laterality: Right;  . PORTACATH PLACEMENT Left 07/05/2015   Procedure: INSERTION PORT-A-CATH;  Surgeon: [name], MD;  Location: [place];  Service: General;  Laterality: Left;  Marland Kitchen VIDEO ASSISTED THORACOSCOPY (VATS)/WEDGE RESECTION Right 11/29/2015   Procedure: BRONCHOSCOPY , VIDEO ASSISTED THORACOSCOPY (VATS)/WEDGE RESECTION;  Surgeon: Grace Isaac, MD;  Location: [place];  Service: Thoracic;  Laterality: Right;    has Cancer of overlapping sites of right female breast ([place]); Recurrent cancer of right breast ([place]); Genetic testing; Adenoma of  liver; S/P lobectomy of lung; Lung cancer, right upper lobe ([place]); and Rash on her problem list.    is allergic to promethazine hcl; tylenol [acetaminophen]; and penicillins.    Medication List       Accurate as of 05/22/16 11:53 AM. Always use your most recent med list.          b complex vitamins tablet Take 1 tablet by mouth daily.   calcium-vitamin D 500-200 MG-UNIT tablet Commonly known as:  OSCAL WITH D Take 2 tablets by mouth daily.   famotidine 10 MG chewable tablet Commonly known as:  PEPCID AC Chew 10 mg by mouth daily as needed for heartburn.   Fish Oil 500 MG Caps Take 1,000 mg by mouth daily.   fulvestrant 250 MG/5ML injection Commonly known as:  FASLODEX Inject 500 mg into the muscle every 30 (thirty) days. One injection each buttock over 1-2 minutes. Warm prior to use.   glucosamine-chondroitin 500-400 MG tablet Take 1 tablet by mouth 3 (three) times daily.   ibuprofen 200 MG tablet Commonly known as:  ADVIL,MOTRIN Take 200 mg by mouth every 6 (six) hours as needed for fever or mild pain.   multivitamin with minerals tablet Take 1 tablet by mouth daily.   Turmeric 500 MG Tabs Take 1,000 mg by mouth daily.        PHYSICAL EXAMINATION  Oncology Vitals 05/22/2016 05/13/2016  Height - -  Weight 61.281 kg -  Weight (  lbs) 135 lbs 2 oz -  BMI (kg/m2) 21.81 kg/m2 -  Temp 98.4 98.2  Pulse 72 68  Resp 18 18  Resp (Historical as of 05/14/12) - -  SpO2 100 100  BSA (m2) 1.69 m2 -   BP Readings from Last 2 Encounters:  05/22/16 107/66  05/13/16 129/72    Physical Exam  Constitutional: She is oriented to person, place, and time and well-developed, well-nourished, and in no distress.  HENT:  Head: Normocephalic and atraumatic.  Eyes: Conjunctivae and EOM are normal. Pupils are equal, round, and reactive to light.  Neck: Normal range of motion.  Pulmonary/Chest: Effort normal. No respiratory distress.  Musculoskeletal: Normal range of motion. She  exhibits no edema or tenderness.  Neurological: She is alert and oriented to person, place, and time. Gait normal.  Skin: Skin is warm and dry. Rash noted. There is erythema. No pallor.  Exam today reveals minimal rash at the right mastectomy site; with 2-3 raised lesions/hives only.  Patient does have some mild erythema to the right back as well; the patient states that this is from a sunburn.  She experienced last week.      Psychiatric: Affect normal.  Nursing note and vitals reviewed.   LABORATORY DATA:. Appointment on 05/22/2016  Component Date Value Ref Range Status  . WBC 05/22/2016 7.0  3.9 - 10.3 10e3/uL Final  . NEUT# 05/22/2016 4.6  1.5 - 6.5 10e3/uL Final  . HGB 05/22/2016 13.5  11.6 - 15.9 g/dL Final  . HCT 05/22/2016 41.7  34.8 - 46.6 % Final  . Platelets 05/22/2016 222  145 - 400 10e3/uL Final  . MCV 05/22/2016 85.5  79.5 - 101.0 fL Final  . MCH 05/22/2016 27.7  25.1 - 34.0 pg Final  . MCHC 05/22/2016 32.4  31.5 - 36.0 g/dL Final  . RBC 05/22/2016 4.88  3.70 - 5.45 10e6/uL Final  . RDW 05/22/2016 13.7  11.2 - 14.5 % Final  . lymph# 05/22/2016 1.9  0.9 - 3.3 10e3/uL Final  . MONO# 05/22/2016 0.4  0.1 - 0.9 10e3/uL Final  . Eosinophils Absolute 05/22/2016 0.1  0.0 - 0.5 10e3/uL Final  . Basophils Absolute 05/22/2016 0.0  0.0 - 0.1 10e3/uL Final  . NEUT% 05/22/2016 66.3  38.4 - 76.8 % Final  . LYMPH% 05/22/2016 26.6  14.0 - 49.7 % Final  . MONO% 05/22/2016 5.5  0.0 - 14.0 % Final  . EOS% 05/22/2016 1.0  0.0 - 7.0 % Final  . BASO% 05/22/2016 0.6  0.0 - 2.0 % Final  . Sodium 05/22/2016 140  136 - 145 mEq/L Final  . Potassium 05/22/2016 4.3  3.5 - 5.1 mEq/L Final  . Chloride 05/22/2016 103  98 - 109 mEq/L Final  . CO2 05/22/2016 27  22 - 29 mEq/L Final  . Glucose 05/22/2016 82  70 - 140 mg/dl Final  . BUN 05/22/2016 17.7  7.0 - 26.0 mg/dL Final  . Creatinine 05/22/2016 0.8  0.6 - 1.1 mg/dL Final  . Total Bilirubin 05/22/2016 0.33  0.20 - 1.20 mg/dL Final  . Alkaline  Phosphatase 05/22/2016 147  40 - 150 U/L Final  . AST 05/22/2016 22  5 - 34 U/L Final  . ALT 05/22/2016 28  0 - 55 U/L Final  . Total Protein 05/22/2016 7.5  6.4 - 8.3 g/dL Final  . Albumin 05/22/2016 4.1  3.5 - 5.0 g/dL Final  . Calcium 05/22/2016 9.8  8.4 - 10.4 mg/dL Final  . Anion Gap 05/22/2016 10  3 - 11 mEq/L Final  . EGFR 05/22/2016 82* >90 ml/min/1.73 m2 Final        RADIOGRAPHIC STUDIES: No results found.  ASSESSMENT/PLAN:    Recurrent cancer of right breast [facility]) Patient received her first monthly Faslodex injection on 05/14/2016.  She is scheduled to return for labs, visit, and her next injection on 06/10/2016.  Rash Patient states that she developed a rash to her right breast mastectomy site within the past 48 hours.  She denies any itching or pain to the site.  She denies any known new allergens.  She may have come in contact with her taking.  She states that she typically wears a prosthesis in the right cup of her bra; and placed a newly washed in her bra just 2 days ago.  Exam today reveals minimal rash at the right mastectomy site; with 2-3 raised lesions/hives only.  Patient does have some mild erythema to the right back as well; the patient states that this is from a sunburn.  She experienced last week.  Patient was advised to try Benadryl and Pepcid; and an educational instruction sheet was printed for the patient.  Patient may also try over-the-counter cortisone cream to the site as well.  Patient was advised to call/return or go directly to the emergency department for any worsening symptoms whatsoever.    Patient stated understanding of all instructions; and was in agreement with this plan of care. The patient knows to call the clinic with any problems, questions or concerns.   Total time spent with patient was 15 minutes;  with greater than 75 percent of that time spent in face to face counseling regarding patient's symptoms,  and coordination of care and  follow up.  Disclaimer:This dictation was prepared with Dragon/digital dictation along with [employer]. Any transcriptional errors that result from this process are unintentional.  Drue Second, NP 05/22/2016

## 2016-05-22 NOTE — Assessment & Plan Note (Signed)
Patient states that she developed a rash to her right breast mastectomy site within the past 48 hours.  She denies any itching or pain to the site.  She denies any known new allergens.  She may have come in contact with her taking.  She states that she typically wears a prosthesis in the right cup of her bra; and placed a newly washed in her bra just 2 days ago.  Exam today reveals minimal rash at the right mastectomy site; with 2-3 raised lesions/hives only.  Patient does have some mild erythema to the right back as well; the patient states that this is from a sunburn.  She experienced last week.  Patient was advised to try Benadryl and Pepcid; and an educational instruction sheet was printed for the patient.  Patient may also try over-the-counter cortisone cream to the site as well.  Patient was advised to call/return or go directly to the emergency department for any worsening symptoms whatsoever.

## 2016-05-22 NOTE — Progress Notes (Signed)
Pt does not have port. Labs were drawn peripherally. Pt stated that port was removed around 01/26/16. No documentation in records stating removal of port. All labs are to be drawn from peripherally in future.

## 2016-05-22 NOTE — Telephone Encounter (Signed)
Pt calling to ask about being seen by CB for a rash near her mastectomy site.  Described it as raise red bumps "about the size of her fingertip."  Pt stated she has a 9am appt but can be here at 10.  Ordering labs and sending POF for appts

## 2016-05-28 ENCOUNTER — Telehealth: Payer: Self-pay | Admitting: *Deleted

## 2016-05-28 NOTE — Telephone Encounter (Signed)
TC to patient to follow up visit to [facility] on 05/22/16  for rash on right breast.  No answer but was able to leave vm message for pt to call back if she has any questions or concerns.  Next appt is on 06/10/16 for lab, MD visit and injection.

## 2016-05-29 ENCOUNTER — Ambulatory Visit: Payer: BLUE CROSS/BLUE SHIELD | Admitting: Physical Therapy

## 2016-05-29 DIAGNOSIS — M25611 Stiffness of right shoulder, not elsewhere classified: Secondary | ICD-10-CM

## 2016-05-29 DIAGNOSIS — I972 Postmastectomy lymphedema syndrome: Secondary | ICD-10-CM | POA: Diagnosis not present

## 2016-05-29 NOTE — Therapy (Signed)
[place], [place], [address] Phone: [phone]   Fax:  [phone]  Physical Therapy Treatment  Patient Details  Name: Crystal [name] MRN: [id number] Date of Birth: December 04, 1955 Referring Provider: Iver Nestle   Encounter Date: 05/29/2016      PT End of Session - 05/29/16 1202    Visit Number 11   Number of Visits 15   Date for PT Re-Evaluation 06/15/16   PT Start Time 0936   PT Stop Time 1018   PT Time Calculation (min) 42 min   Activity Tolerance Patient tolerated treatment well   Behavior During Therapy [facility] for tasks assessed/performed      Past Medical History:  Diagnosis Date  . Anxiety   . Cancer ([place])    right breast  . Complication of anesthesia    hard to wake up  . GERD (gastroesophageal reflux disease)   . History of small bowel obstruction   . Insomnia   . Liver masses     Past Surgical History:  Procedure Laterality Date  . BREAST SURGERY Right   . HERNIA REPAIR     incisional hernia repair x3  . laparotomies    . LIVER SURGERY    . LOBECTOMY Right 11/29/2015   Procedure: Completion Right Upper  LOBECTOMY, Lymph node dissection, on Q placement;  Surgeon: Grace Isaac, MD;  Location: [place];  Service: Thoracic;  Laterality: Right;  . MASTECTOMY W/ SENTINEL NODE BIOPSY Right 07/05/2015   Procedure: RIGHT MASTECTOMY WITH SENTINEL NODE BIOPSY AND PORT A CATH INSERTION;  Surgeon: [name], MD;  Location: [place];  Service: General;  Laterality: Right;  . PORTACATH PLACEMENT Left 07/05/2015   Procedure: INSERTION PORT-A-CATH;  Surgeon: [name], MD;  Location: [place];  Service: General;  Laterality: Left;  Marland Kitchen VIDEO ASSISTED THORACOSCOPY (VATS)/WEDGE RESECTION Right 11/29/2015   Procedure: BRONCHOSCOPY , VIDEO ASSISTED THORACOSCOPY (VATS)/WEDGE RESECTION;  Surgeon: Grace Isaac, MD;  Location: [place];  Service:  Thoracic;  Laterality: Right;    There were no vitals filed for this visit.      Subjective Assessment - 05/29/16 0937    Subjective Had developed a rash, hive-like welts at mastectomy site and was seen at the [facility] for that.  Took Benadryl and increased Pepcid for four days and it's better.  Patient is unsure what would have caused this.  Area doesn't seem swollen or tight right now.  Has done manual lymph drainage some but hasn't needed it a lot.  Feels like reaching up is still a challenge.   Currently in Pain? No/denies            [facility] PT Assessment - 05/29/16 0001      Observation/Other Assessments   Observations right chest area and axilla look clear today, with no evidence of swelling to observation or palpation in that area   Skin Integrity clear skin where patient reports she has had a recent rash                     [facility] Adult PT Treatment/Exercise - 05/29/16 0001      Manual Therapy   Soft tissue mobilization around right mastectomy incision in supine;   Myofascial Release crosshands technique at right chest in horizontal, vertical, and diagonal directions; in supine, right UE myofascial pulling and pulling with concurrent soft tissue mobilzation across right axilla and lateral chest tightness   Scapular Mobilization  in left sidelying for right scapula   Passive ROM right shoulder for ER, abduction, and flexion                        Long Term Clinic Goals - 05/29/16 1204      CC Long Term Goal  #1   Title Patient will be independent in self manual lymph drainage   Status Achieved     CC Long Term Goal  #5   Title Report she is able to reach overhead without complaints of increased pain in right axilla or shoulder.   Status Partially Met            Plan - 05/29/16 1202    Clinical Impression Statement Area at right upper flank/lateral chest does not appear swollen today--appears reduced compared to recent therapy  sessions, and patient says she doesn't notice any fullness there.  Still with tightness and limited ROM with pain on stretching for right shoulder motions.   Rehab Potential Good   Clinical Impairments Affecting Rehab Potential previous radiaton to right upper quadrant skin, ongoing treatment for lung cancer , osteoporosis    PT Frequency 1x / week   PT Duration 4 weeks   PT Treatment/Interventions ADLs/Self Care Home Management;Patient/family education;Manual lymph drainage;Manual techniques;Passive range of motion   PT Next Visit Plan Review manual lymph drainage as needed; continue manual work; check HEP, check goals   PT Home Exercise Plan shoulder stretches, manual lymph drainage as needed   Consulted and Agree with Plan of Care Patient      Patient will benefit from skilled therapeutic intervention in order to improve the following deficits and impairments:  Decreased range of motion, Increased fascial restricitons, Impaired UE functional use, Decreased activity tolerance, Impaired perceived functional ability, Pain, Impaired flexibility, Decreased scar mobility, Decreased knowledge of use of DME, Decreased strength, Increased edema  Visit Diagnosis: Stiffness of right shoulder, not elsewhere classified     Problem List Patient Active Problem List   Diagnosis Date Noted  . Rash 05/22/2016  . Lung cancer, right upper lobe ([place]) 11/30/2015  . S/P lobectomy of lung 11/29/2015  . Adenoma of liver 11/06/2015  . Genetic testing 08/11/2015  . Recurrent cancer of right breast ([place]) 06/05/2015  . Cancer of overlapping sites of right female breast ([place]) 03/16/2012    SALISBURY,DONNA 05/29/2016, 12:08 PM  [place], [place], [address] Phone: [phone]   Fax:  [phone]  Name: HEAVEN MEEKER MRN: [id number] Date of Birth: 07/25/1956   Serafina Royals, PT 05/29/16 12:08 PM

## 2016-06-05 ENCOUNTER — Ambulatory Visit: Payer: BLUE CROSS/BLUE SHIELD | Admitting: Physical Therapy

## 2016-06-05 DIAGNOSIS — I972 Postmastectomy lymphedema syndrome: Secondary | ICD-10-CM

## 2016-06-05 DIAGNOSIS — M25611 Stiffness of right shoulder, not elsewhere classified: Secondary | ICD-10-CM

## 2016-06-05 NOTE — Therapy (Signed)
[place], [place], [address] Phone: [phone]   Fax:  [phone]  Physical Therapy Treatment  Patient Details  Name: Crystal [name] MRN: [id number] Date of Birth: 1955-12-19 Referring Provider: Iver Nestle   Encounter Date: 06/05/2016      PT End of Session - 06/05/16 1215    Visit Number 12   Number of Visits 15   Date for PT Re-Evaluation 06/15/16   PT Start Time 0936   PT Stop Time 1020   PT Time Calculation (min) 44 min   Activity Tolerance Patient tolerated treatment well   Behavior During Therapy [facility] for tasks assessed/performed      Past Medical History:  Diagnosis Date  . Anxiety   . Cancer ([place])    right breast  . Complication of anesthesia    hard to wake up  . GERD (gastroesophageal reflux disease)   . History of small bowel obstruction   . Insomnia   . Liver masses     Past Surgical History:  Procedure Laterality Date  . BREAST SURGERY Right   . HERNIA REPAIR     incisional hernia repair x3  . laparotomies    . LIVER SURGERY    . LOBECTOMY Right 11/29/2015   Procedure: Completion Right Upper  LOBECTOMY, Lymph node dissection, on Q placement;  Surgeon: Grace Isaac, MD;  Location: [place];  Service: Thoracic;  Laterality: Right;  . MASTECTOMY W/ SENTINEL NODE BIOPSY Right 07/05/2015   Procedure: RIGHT MASTECTOMY WITH SENTINEL NODE BIOPSY AND PORT A CATH INSERTION;  Surgeon: [name], MD;  Location: [place];  Service: General;  Laterality: Right;  . PORTACATH PLACEMENT Left 07/05/2015   Procedure: INSERTION PORT-A-CATH;  Surgeon: [name], MD;  Location: [place];  Service: General;  Laterality: Left;  Marland Kitchen VIDEO ASSISTED THORACOSCOPY (VATS)/WEDGE RESECTION Right 11/29/2015   Procedure: BRONCHOSCOPY , VIDEO ASSISTED THORACOSCOPY (VATS)/WEDGE RESECTION;  Surgeon: Grace Isaac, MD;  Location: [place];  Service:  Thoracic;  Laterality: Right;    There were no vitals filed for this visit.      Subjective Assessment - 06/05/16 0939    Subjective Things are good.  "I feel a little full this morning, which I hadn't had.  I was at my daughter's and didn't have a private place to do the massage."   Currently in Pain? Yes   Pain Score 4    Pain Location Axilla   Pain Orientation Right   Pain Descriptors / Indicators Other (Comment)  fullness   Aggravating Factors  not able to do manual lymph drainage   Pain Relieving Factors manual lymph drainage                         [facility] Adult PT Treatment/Exercise - 06/05/16 0001      Manual Therapy   Soft tissue mobilization around right mastectomy incision in supine;   Myofascial Release crosshands technique at right chest in horizontal, vertical, and diagonal directions; in supine, right UE myofascial pulling and pulling with concurrent soft tissue mobilzation across right axilla and lateral chest tightness   Manual Lymphatic Drainage (MLD) diaphragmatic breathing, short neck, left axilla and anterior interaxillary anastomosis, right groin and axillo-inguinal anastomosis, and time spent at right lateral chest, directing toward pathways; in left sidelying, posterior interaxillary anastomosis right to left and right axillo-inguinal anastomosis   Passive ROM right shoulder for ER, abduction, and flexion  [facility] Goals - 06/05/16 0941      CC Long Term Goal  #5   Title Report she is able to reach overhead without complaints of increased pain in right axilla or shoulder.   Baseline only hurts if there is weight to what she is lifting   Status Partially Met            Plan - 06/05/16 1215    Clinical Impression Statement Patient continues to note benefit of coming to therapy. Therapist is able to do some things that she cannot do for herself.  Pt. reports noticing increased ease with daily  activities.   Rehab Potential Good   Clinical Impairments Affecting Rehab Potential previous radiaton to right upper quadrant skin, ongoing treatment for lung cancer , osteoporosis    PT Frequency 1x / week   PT Duration 4 weeks   PT Treatment/Interventions ADLs/Self Care Home Management;Patient/family education;Manual lymph drainage;Manual techniques;Passive range of motion   PT Next Visit Plan Review manual lymph drainage as needed; continue manual work; check HEP, check goals   PT Home Exercise Plan shoulder stretches, manual lymph drainage as needed   Consulted and Agree with Plan of Care Patient      Patient will benefit from skilled therapeutic intervention in order to improve the following deficits and impairments:  Decreased range of motion, Increased fascial restricitons, Impaired UE functional use, Decreased activity tolerance, Impaired perceived functional ability, Pain, Impaired flexibility, Decreased scar mobility, Decreased knowledge of use of DME, Decreased strength, Increased edema  Visit Diagnosis: Stiffness of right shoulder, not elsewhere classified  Postmastectomy lymphedema     Problem List Patient Active Problem List   Diagnosis Date Noted  . Rash 05/22/2016  . Lung cancer, right upper lobe ([place]) 11/30/2015  . S/P lobectomy of lung 11/29/2015  . Adenoma of liver 11/06/2015  . Genetic testing 08/11/2015  . Recurrent cancer of right breast ([place]) 06/05/2015  . Cancer of overlapping sites of right female breast [facility]) 03/16/2012    SALISBURY,DONNA 06/05/2016, 12:17 PM  [place], [place], [address] Phone: [phone]   Fax:  [phone]  Name: Crystal [name] MRN: [id number] Date of Birth: 03-24-1956   Serafina Royals, PT 06/05/16 12:17 PM

## 2016-06-07 ENCOUNTER — Encounter (HOSPITAL_COMMUNITY): Payer: Self-pay

## 2016-06-07 ENCOUNTER — Ambulatory Visit (HOSPITAL_COMMUNITY)
Admission: RE | Admit: 2016-06-07 | Discharge: 2016-06-07 | Disposition: A | Discharge status: Home / Self Care | Payer: BLUE CROSS/BLUE SHIELD | Source: Ambulatory Visit | Attending: Nurse Practitioner | Admitting: Nurse Practitioner

## 2016-06-07 DIAGNOSIS — C3411 Malignant neoplasm of upper lobe, right bronchus or lung: Secondary | ICD-10-CM | POA: Diagnosis not present

## 2016-06-07 DIAGNOSIS — C50919 Malignant neoplasm of unspecified site of unspecified female breast: Secondary | ICD-10-CM | POA: Diagnosis not present

## 2016-06-07 DIAGNOSIS — R911 Solitary pulmonary nodule: Secondary | ICD-10-CM | POA: Insufficient documentation

## 2016-06-07 DIAGNOSIS — C50911 Malignant neoplasm of unspecified site of right female breast: Secondary | ICD-10-CM | POA: Insufficient documentation

## 2016-06-07 DIAGNOSIS — K769 Liver disease, unspecified: Secondary | ICD-10-CM | POA: Diagnosis not present

## 2016-06-07 DIAGNOSIS — Z902 Acquired absence of lung [part of]: Secondary | ICD-10-CM | POA: Diagnosis not present

## 2016-06-07 DIAGNOSIS — C3490 Malignant neoplasm of unspecified part of unspecified bronchus or lung: Secondary | ICD-10-CM | POA: Diagnosis not present

## 2016-06-07 MED ORDER — IOPAMIDOL (ISOVUE-300) INJECTION 61%
75.0000 mL | Freq: Once | INTRAVENOUS | Status: AC | PRN
  Administered 2016-06-07: 75 mL via INTRAVENOUS

## 2016-06-09 NOTE — Progress Notes (Signed)
ID: Crystal [name]   DOB: Nov 10, 1955  MR#: [id number]  BDZ#:[id number]  PCP: Crystal Hams, MD GYN: SU: Crystal Keens MD OTHER MD: Crystal [name], Crystal [name], Crystal [name]]  CHIEF COMPLAINT: recurrent estrogen receptor positive breast cancer in previously irradiated breast; pT1a lung cancer  CURRENT TREATMENT: Fulvestrant  BREAST CANCER HISTORY:   From the original intake note:  The patient had bilateral diagnostic mammography 02/12/2011.  She has a history of cystic changes noted on prior studies in April 2011 and April 2010.  On the Feb 12, 2011 study there were new calcifications identified superiorly in the right breast, without any obvious mass.  The patient was brought back for further studies, 02/19/2011 and Dr. Marcelo [name] was able to localize the calcifications under stereotactic guidance.  The biopsy ([id number]) showed high-grade ductal carcinoma with papillary features which was 70% estrogen receptor positive, 0% progesterone receptor positive.  There was no definitive invasion and therefore further studies were not performed.   Bilateral breast MRIs were obtained Feb 26, 2011.  This showed marked nodular enhancement throughout both breasts, and in the upper central portion there was a post biopsy area of change measuring up to 3.7 cm.  Most of this is a fluid cavity with a thin rim of enhancement.  There were no suspicious areas in the left breast.  No internal mammary or axillary lymph nodes.  Of course, the patient has a history of hepatic adenomas and these were noted incidentally on the MRI.   Her definitive surgical treatment and subsequent history are as detailed below.  INTERVAL HISTORY: Crystal [name] returns today for follow up of her breast and lung cancers. She is due for fulvestrant today. Since her [name] visit here she also was restaged with a CT scan of the chest, 06/07/2016, which showed no evidence of active disease  With her [name] cycle she had 5 weeks in between  treatments and she really enjoyed that fifth week. She gets a cough problem usually 1-2 weeks after a dose of fulvestrant and this can be bad enough that she vomits. Fortunately it happens very infrequently. She does not think that it is related to her prior lung surgery.  REVIEW OF SYSTEMS: She continues to feel moderately fatigued. Especially from day 5 through day 15 of each cycle, she has that cough, and heartburn problems, and some diarrhea, and just doesn't do as well. The cough is dry. She had an episode of hives which was treated with Benadryl and Pepcid and resolved within a few days. She started having pain in the left hip and now in the right hip the [name] several days but it is better today she says. She is having hot flashes but they are manageable. She worries about her bone density. A detailed review of systems today was otherwise stable.  PAST MEDICAL HISTORY: Past Medical History:  Diagnosis Date  . Anxiety   . Cancer ([place])    right breast  . Complication of anesthesia    hard to wake up  . GERD (gastroesophageal reflux disease)   . History of small bowel obstruction   . Insomnia   . Liver masses   history of small-bowel obstruction with partial colectomy March 2001.  The patient has a history of hepatic adenomas.  She has had liver resections x2.  The [facility] this [facility] and we will try to obtain it.  She has also had incisional hernia repair.  She has a history of osteopenia, and she has a history  of multiple anti-red cell antibodies making a transfusion difficult.  The patient tells me that she has a history of low calcium and a tendency to get liver injections.  PAST SURGICAL HISTORY: Past Surgical History:  Procedure Laterality Date  . BREAST SURGERY Right   . HERNIA REPAIR     incisional hernia repair x3  . laparotomies    . LIVER SURGERY    . LOBECTOMY Right 11/29/2015   Procedure: Completion Right Upper  LOBECTOMY, Lymph node dissection, on Q placement;   Surgeon: Crystal Isaac, MD;  Location: [facility];  Service: Thoracic;  Laterality: Right;  . MASTECTOMY W/ SENTINEL NODE BIOPSY Right 07/05/2015   Procedure: RIGHT MASTECTOMY WITH SENTINEL NODE BIOPSY AND PORT A CATH INSERTION;  Surgeon: Crystal Keens, MD;  Location: [facility];  Service: General;  Laterality: Right;  . PORTACATH PLACEMENT Left 07/05/2015   Procedure: INSERTION PORT-A-CATH;  Surgeon: Crystal Keens, MD;  Location: [facility];  Service: General;  Laterality: Left;  Marland Kitchen VIDEO ASSISTED THORACOSCOPY (VATS)/WEDGE RESECTION Right 11/29/2015   Procedure: BRONCHOSCOPY , VIDEO ASSISTED THORACOSCOPY (VATS)/WEDGE RESECTION;  Surgeon: Crystal Isaac, MD;  [facility];  Service: Thoracic;  Laterality: Right;    FAMILY HISTORY Family History  Problem Relation Age of Onset  . Ovarian cancer Mother 49    "metastasized to breasts"  . Breast cancer Maternal Grandmother 48    2-3 recurrences  . Stomach cancer Maternal Grandfather     dx. 47-49; metastatic stomach cancer  . Colon polyps Father     about 2-3 polyps removed at each colonoscopy  . Colon polyps Brother     "a few"  . Other Maternal Aunt     +TAH  . Colon cancer Paternal Aunt 90  . Brain cancer Daughter 17    astrocytoma  . Breast cancer Cousin     dx. 12s  The patient's mother died from ovarian cancer at the age of 21.  The patient's father is alive at age 24.  She has 2 brothers, no sisters.  A maternal grandmother was diagnosed with breast cancer at the age of 33, and the maternal grandfather had stomach cancer at the age of 52.  GYNECOLOGIC HISTORY: The patient had menarche at age 89.  Her [name] menstrual period was in 2005.  She never used hormone replacement.  She used birth control briefly to control bleeding prior to 1980.  She was 60 years old at the birth of her first child.  She started having mammograms at age 9 because of a strong family history.  SOCIAL HISTORY: Crystal [name]  works as a Programmer, applications for [employer].   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  Son, [name], lives in [place] and works for that [employer]  Daughter, [name], lives in [place] and is a homemaker.   ADVANCED DIRECTIVES: in place  HEALTH MAINTENANCE: Social History  Substance Use Topics  . Smoking status: Never Smoker  . Smokeless tobacco: Never Used  . Alcohol use 0.6 oz/week    1 Glasses of wine per week     Comment: social     Colonoscopy:  PAP:  Bone density:  Lipid panel:  Allergies  Allergen Reactions  . Promethazine Hcl Other (See Comments)    Causes shaking  . Tylenol [Acetaminophen] Other (See Comments)    Avoids Tylenol products due to  liver disease   . Penicillins Rash    Current Outpatient Prescriptions  Medication Sig Dispense Refill  . b complex vitamins tablet Take 1 tablet by mouth daily.      . calcium-vitamin D (OSCAL WITH D) 500-200 MG-UNIT per tablet Take 2 tablets by mouth daily.    . famotidine (PEPCID AC) 10 MG chewable tablet Chew 10 mg by mouth daily as needed for heartburn.    . fulvestrant (FASLODEX) 250 MG/5ML injection Inject 500 mg into the muscle every 30 (thirty) days. One injection each buttock over 1-2 minutes. Warm prior to use.    Marland Kitchen glucosamine-chondroitin 500-400 MG tablet Take 1 tablet by mouth 3 (three) times daily.      Marland Kitchen ibuprofen (ADVIL,MOTRIN) 200 MG tablet Take 200 mg by mouth every 6 (six) hours as needed for fever or mild pain.     . Multiple Vitamins-Minerals (MULTIVITAMIN WITH MINERALS) tablet Take 1 tablet by mouth daily.      . Omega-3 Fatty Acids (FISH OIL) 500 MG CAPS Take 1,000 mg by mouth daily.    . Turmeric 500 MG TABS Take 1,000 mg by mouth daily.     No current facility-administered medications for this visit.     OBJECTIVE: Middle-aged white woman In no acute distress Vitals:    06/10/16 0853  BP: 123/75  Pulse: 70  Resp: 18  Temp: 98.6 F (37 C)     Body mass index is 21.87 kg/m.    ECOG FS: 1  Sclerae unicteric, pupils round and equal Oropharynx clear and moist-- good dentition No cervical or supraclavicular adenopathy Lungs no rales or rhonchi Heart regular rate and rhythm Abd soft, nontender, positive bowel sounds MSK no focal spinal tenderness, no upper extremity lymphedema Neuro: nonfocal, well oriented, appropriate affect Breasts: The right breast is status post mastectomy. There is no evidence of chest wall recurrence. There is an area in the inferior aspect of the incision which is a little bit more prominent than this is where she gets of fluid accumulation she says. She has been massaging this. The right axilla is benign. The left breast is unremarkable.   LAB RESULTS: Lab Results  Component Value Date   WBC 6.0 06/10/2016   NEUTROABS 3.9 06/10/2016   HGB 13.9 06/10/2016   HCT 42.9 06/10/2016   MCV 86.2 06/10/2016   PLT 206 06/10/2016      Chemistry      Component Value Date/Time   NA 140 05/22/2016 1025   K 4.3 05/22/2016 1025   CL 105 12/03/2015 0430   CL 102 03/15/2013 0912   CO2 27 05/22/2016 1025   BUN 17.7 05/22/2016 1025   CREATININE 0.8 05/22/2016 1025      Component Value Date/Time   CALCIUM 9.8 05/22/2016 1025   ALKPHOS 147 05/22/2016 1025   AST 22 05/22/2016 1025   ALT 28 05/22/2016 1025   BILITOT 0.33 05/22/2016 1025       Lab Results  Component Value Date   LABCA2 28 10/20/2012    No components found for: [id number]  No results for input(s): INR in the [name] 168 hours.  Urinalysis    Component Value Date/Time   COLORURINE YELLOW 11/27/2015 1403    STUDIES: Ct Chest W Contrast  Result Date: 06/07/2016 CLINICAL DATA:  Breast cancer and lung cancer. EXAM: CT CHEST WITH CONTRAST TECHNIQUE: Multidetector CT imaging of the chest was performed during intravenous contrast administration. CONTRAST:  34m  ISOVUE-300 IOPAMIDOL (ISOVUE-300) INJECTION 61% COMPARISON:  PET-CT  10/31/2015. FINDINGS: Cardiovascular: The heart size is normal.  No pericardial effusion. Mediastinum/Nodes: No mediastinal lymphadenopathy. There is no hilar lymphadenopathy. Postsurgical change noted right hilum. The esophagus has normal imaging features. There is no axillary lymphadenopathy. Lungs/Pleura: Volume loss noted in the right hemi thorax with suture line and scarring in the right apex. Atelectasis or scarring noted at the right base. 3 mm left apical nodule (image 18 series 5) is stable. No focal airspace consolidation. No pulmonary edema or pleural effusion. Upper Abdomen: As before, numerous lesions are seen scattered through the liver. One of the more dominant lesions seen anteriorly in the left liver measures 2.8 x 5.0 cm today compared to 3.1 x 4.8 cm previously. 3.0 x 4.3 cm lesion in the lateral segment left liver on today's study measured 3.2 x 4.9 cm previously. Surgical clips are seen along the posterior liver suggesting previous right hepatectomy Musculoskeletal: Multiple nonacute right rib fractures noted. No worrisome lytic or sclerotic osseous abnormality. IMPRESSION: 1. Status post right upper lobectomy. Stable CT evaluation of the chest. 2. Small left apical pulmonary nodule is unchanged since 10/31/2015. 3. Multiple liver lesions again noted without substantial interval change. Electronically Signed   By: Misty Stanley M.D.   On: 06/07/2016 17:26    ASSESSMENT: 60 y.o.  BRCA 1-2 and p-53 negative [place] woman status post right lumpectomy and sentinel lymph node sampling June 2012 for a T1a N0, Stage IA  invasive ductal carcinoma, grade 3, estrogen receptor 98% positive, progesterone receptor 97% positive, with an MIB-1 of 5% and no HER-2 amplification   (a) note that the initial Right breast biopsy showed DCIS w papillary architecture, estrogen receptor 70% positive, progesterone receptor negative  (1)  completed radiation September 2012.   (2) on letrozole starting July 2012, discontinued August 2016 with recurrence  RECURRENT DISEASE: (3) right breast upper outer quadrant biopsy 05/23/2015 was positive for a clinical T1c N0, stage IA invasive ductal carcinoma, grade 2, estrogen receptor 60% positive with moderate staining intensity, progesterone receptor negative, with no HER-2 amplification and an MIB-1 of 90%.  (4) status post right simple mastectomy 07/05/2015 for a pT1c pN0, stage IA invasive ductal carcinoma, grade 3, with negative margins. 4 lymph nodes removed with mastectomy were benign.  (a) the patient is not planning on reconstruction  (5) began cyclophosphamide and docetaxel 07/26/2015, repeated every 21 days 4, final dose 09/26/2015  (6) genetics testing 07/20/2015 was normal and did not reveal a deleterious mutation in ATM, BARD1, BRCA1, BRCA2, BRIP1, CDH1, CHEK2, FANCC, MLH1, MSH2, MSH6, NBN, PALB2, PMS2, PTEN, RAD51C, RAD51D, TP53, and XRCC2. Additionally no variant of uncertain significance were found.   LUNG CANCER: (7) RUL spiculated nodule measuring 1.4 cm noted on staging CT scan 06/13/2015-- stable on repeat CT 07/25/15--moderately hot and slightly larger on PET 10/31/2015  (a) status post right upper lobectomy and lymph node dissection 11/29/2015 for a pT1a pN0, stage IA adenocarcinoma, low-grade, with negative margins   (8) history of multiple hepatic adenomas  (9) fulvestrant started 10/23/2015  PLAN: Crystal [name] is now just about a year out from definitive surgery for her breast cancer with no evidence of disease recurrence. This is favorable.  There is also no evidence of lung cancer recurrence. I am going to obtain a chest x-ray with her November visit to follow-up on that.  She is having some unusual symptoms from the fulvestrant. I requested that she keep a diary this time and that we treat those symptoms aggressively. If she gets a cough  she will try  antitussives and they don't work she will call me. If she gets diarrhea she will try Imodium and if that doesn't work she will call me. If we cannot control her symptoms we can always switch, but she is very reluctant to go to tamoxifen because of her history of hepatic adenomas, and as she is concerned about going back to aromatase inhibitors since her cancer recurred on letrozole.  Her exam today as far as her hips is concern is very reassuring.  She had a bone density but I do not have those results yet for review.  Accordingly the plan for now is to continue the fulvestrant monthly. She will see me again within November dose. She will have a chest x-ray that same day. She knows to call for any problems that may develop before that visit. Claudell Wohler C    06/10/2016

## 2016-06-10 ENCOUNTER — Telehealth: Payer: Self-pay | Admitting: Oncology

## 2016-06-10 ENCOUNTER — Other Ambulatory Visit (HOSPITAL_BASED_OUTPATIENT_CLINIC_OR_DEPARTMENT_OTHER): Payer: BLUE CROSS/BLUE SHIELD

## 2016-06-10 ENCOUNTER — Ambulatory Visit (HOSPITAL_BASED_OUTPATIENT_CLINIC_OR_DEPARTMENT_OTHER): Payer: BLUE CROSS/BLUE SHIELD | Admitting: Oncology

## 2016-06-10 ENCOUNTER — Ambulatory Visit (HOSPITAL_BASED_OUTPATIENT_CLINIC_OR_DEPARTMENT_OTHER): Payer: BLUE CROSS/BLUE SHIELD

## 2016-06-10 VITALS — BP 123/75 | HR 70 | Temp 98.6°F | Resp 18 | Ht 66.0 in | Wt 135.5 lb

## 2016-06-10 DIAGNOSIS — N951 Menopausal and female climacteric states: Secondary | ICD-10-CM

## 2016-06-10 DIAGNOSIS — C50811 Malignant neoplasm of overlapping sites of right female breast: Secondary | ICD-10-CM

## 2016-06-10 DIAGNOSIS — C50412 Malignant neoplasm of upper-outer quadrant of left female breast: Secondary | ICD-10-CM

## 2016-06-10 DIAGNOSIS — C3411 Malignant neoplasm of upper lobe, right bronchus or lung: Secondary | ICD-10-CM

## 2016-06-10 DIAGNOSIS — R5383 Other fatigue: Secondary | ICD-10-CM

## 2016-06-10 DIAGNOSIS — R197 Diarrhea, unspecified: Secondary | ICD-10-CM

## 2016-06-10 DIAGNOSIS — Z5111 Encounter for antineoplastic chemotherapy: Secondary | ICD-10-CM

## 2016-06-10 DIAGNOSIS — R05 Cough: Secondary | ICD-10-CM | POA: Diagnosis not present

## 2016-06-10 DIAGNOSIS — C50911 Malignant neoplasm of unspecified site of right female breast: Secondary | ICD-10-CM

## 2016-06-10 LAB — CBC WITH DIFFERENTIAL/PLATELET
BASO%: 0.6 % (ref 0.0–2.0)
Basophils Absolute: 0 10*3/uL (ref 0.0–0.1)
EOS ABS: 0.1 10*3/uL (ref 0.0–0.5)
EOS%: 1.5 % (ref 0.0–7.0)
HCT: 42.9 % (ref 34.8–46.6)
HEMOGLOBIN: 13.9 g/dL (ref 11.6–15.9)
LYMPH%: 28.3 % (ref 14.0–49.7)
MCH: 28 pg (ref 25.1–34.0)
MCHC: 32.5 g/dL (ref 31.5–36.0)
MCV: 86.2 fL (ref 79.5–101.0)
MONO#: 0.3 10*3/uL (ref 0.1–0.9)
MONO%: 4.9 % (ref 0.0–14.0)
NEUT%: 64.7 % (ref 38.4–76.8)
NEUTROS ABS: 3.9 10*3/uL (ref 1.5–6.5)
Platelets: 206 10*3/uL (ref 145–400)
RBC: 4.97 10*6/uL (ref 3.70–5.45)
RDW: 13.9 % (ref 11.2–14.5)
WBC: 6 10*3/uL (ref 3.9–10.3)
lymph#: 1.7 10*3/uL (ref 0.9–3.3)

## 2016-06-10 LAB — COMPREHENSIVE METABOLIC PANEL
ALBUMIN: 4 g/dL (ref 3.5–5.0)
ALK PHOS: 129 U/L (ref 40–150)
ALT: 30 U/L (ref 0–55)
AST: 23 U/L (ref 5–34)
Anion Gap: 11 mEq/L (ref 3–11)
BILIRUBIN TOTAL: 0.32 mg/dL (ref 0.20–1.20)
BUN: 12.8 mg/dL (ref 7.0–26.0)
CO2: 25 meq/L (ref 22–29)
CREATININE: 0.8 mg/dL (ref 0.6–1.1)
Calcium: 9.8 mg/dL (ref 8.4–10.4)
Chloride: 105 mEq/L (ref 98–109)
EGFR: 82 mL/min/{1.73_m2} — AB (ref >90)
GLUCOSE: 84 mg/dL (ref 70–140)
Potassium: 4.2 mEq/L (ref 3.5–5.1)
SODIUM: 141 meq/L (ref 136–145)
TOTAL PROTEIN: 7.5 g/dL (ref 6.4–8.3)

## 2016-06-10 MED ORDER — FULVESTRANT 250 MG/5ML IM SOLN
500.0000 mg | Freq: Once | INTRAMUSCULAR | Status: AC
  Administered 2016-06-10: 500 mg via INTRAMUSCULAR
  Filled 2016-06-10: qty 10

## 2016-06-10 NOTE — Patient Instructions (Signed)

## 2016-06-10 NOTE — Telephone Encounter (Signed)
appt made and avs printed °

## 2016-06-12 ENCOUNTER — Ambulatory Visit: Payer: BLUE CROSS/BLUE SHIELD | Admitting: Physical Therapy

## 2016-06-12 DIAGNOSIS — M25611 Stiffness of right shoulder, not elsewhere classified: Secondary | ICD-10-CM | POA: Diagnosis not present

## 2016-06-12 DIAGNOSIS — I972 Postmastectomy lymphedema syndrome: Secondary | ICD-10-CM

## 2016-06-12 NOTE — Therapy (Signed)
[place], [place], [address] Phone: [phone]   Fax:  [phone]  Physical Therapy Treatment  Patient Details  Name: Crystal [name] MRN: [id number] Date of Birth: 10/08/1956 Referring Provider: Iver Nestle   Encounter Date: 06/12/2016      PT End of Session - 06/12/16 1253    Visit Number 13   Number of Visits 15   PT Start Time 0935   PT Stop Time 1015   PT Time Calculation (min) 40 min   Activity Tolerance Patient tolerated treatment well   Behavior During Therapy [facility] for tasks assessed/performed      Past Medical History:  Diagnosis Date  . Anxiety   . Cancer ([place])    right breast  . Complication of anesthesia    hard to wake up  . GERD (gastroesophageal reflux disease)   . History of small bowel obstruction   . Insomnia   . Liver masses     Past Surgical History:  Procedure Laterality Date  . BREAST SURGERY Right   . HERNIA REPAIR     incisional hernia repair x3  . laparotomies    . LIVER SURGERY    . LOBECTOMY Right 11/29/2015   Procedure: Completion Right Upper  LOBECTOMY, Lymph node dissection, on Q placement;  Surgeon: Grace Isaac, MD;  Location: [facility];  Service: Thoracic;  Laterality: Right;  . MASTECTOMY W/ SENTINEL NODE BIOPSY Right 07/05/2015   Procedure: RIGHT MASTECTOMY WITH SENTINEL NODE BIOPSY AND PORT A CATH INSERTION;  Surgeon: [name], MD;  Location: [facility];  Service: General;  Laterality: Right;  . PORTACATH PLACEMENT Left 07/05/2015   Procedure: INSERTION PORT-A-CATH;  Surgeon: [name], MD;  Location: [facility];  Service: General;  Laterality: Left;  Marland Kitchen VIDEO ASSISTED THORACOSCOPY (VATS)/WEDGE RESECTION Right 11/29/2015   Procedure: BRONCHOSCOPY , VIDEO ASSISTED THORACOSCOPY (VATS)/WEDGE RESECTION;  Surgeon: Grace Isaac, MD;  Location: [facility];  Service: Thoracic;  Laterality: Right;    There  were no vitals filed for this visit.      Subjective Assessment - 06/12/16 0939    Subjective Had a CT scan and met with Dr. Jana Hakim.  There's one small spot, but both he and the radiologist think it's just scar tissue.   Currently in Pain? No/denies                         [facility] Adult PT Treatment/Exercise - 06/12/16 0001      Manual Therapy   Soft tissue mobilization around right mastectomy incision in supine;   Myofascial Release crosshands technique at right chest in horizontal, vertical, and diagonal directions; in supine, right UE myofascial pulling and pulling with concurrent soft tissue mobilzation across right axilla and lateral chest tightness   Manual Lymphatic Drainage (MLD) diaphragmatic breathing, short neck, left axilla and anterior interaxillary anastomosis, right groin and axillo-inguinal anastomosis, and time spent at right lateral chest, directing toward pathways; in left sidelying, posterior interaxillary anastomosis right to left and right axillo-inguinal anastomosis   Passive ROM right shoulder for er and abduction                        [facility] Goals - 06/12/16 1255      CC Long Term Goal  #1   Title Patient will be independent in self manual lymph drainage   Status Achieved  CC Long Term Goal  #2   Title Patient will be know how to obtain and use compression garments for maintenance phase of treatment   Baseline but patient chooses not to pursue this   Status Achieved     CC Long Term Goal  #3   Title Increase right shoulder abduction to >/= 150 degrees for increased ease reaching   Status Achieved     CC Long Term Goal  #4   Title Patient will decrease the DASH score to <39    to demonstrate increased functional use of upper extremity   Status Achieved     CC Long Term Goal  #5   Title Report she is able to reach overhead without complaints of increased pain in right axilla or shoulder.   Baseline but as of  8/30, there is still discomfort if she is lifting something with weight to it   Status Achieved            Plan - 06/12/16 1253    Clinical Impression Statement Patient is doing well and feels she knows how to do manual lymph drainage, though doesn't feel fully confident without her notes.  She has been doing that and her home exercise program.  She does not note back swelling currently.   Rehab Potential Good   Clinical Impairments Affecting Rehab Potential previous radiaton to right upper quadrant skin, ongoing treatment for lung cancer , osteoporosis    PT Treatment/Interventions ADLs/Self Care Home Management;Patient/family education;Manual lymph drainage;Manual techniques;Passive range of motion   PT Next Visit Plan None, discharge   PT Home Exercise Plan shoulder stretches, manual lymph drainage as needed   Consulted and Agree with Plan of Care Patient      Patient will benefit from skilled therapeutic intervention in order to improve the following deficits and impairments:  Decreased range of motion, Increased fascial restricitons, Impaired UE functional use, Decreased activity tolerance, Impaired perceived functional ability, Pain, Impaired flexibility, Decreased scar mobility, Decreased knowledge of use of DME, Decreased strength, Increased edema  Visit Diagnosis: Stiffness of right shoulder, not elsewhere classified  Postmastectomy lymphedema     Problem List Patient Active Problem List   Diagnosis Date Noted  . Rash 05/22/2016  . Lung cancer, right upper lobe ([place]) 11/30/2015  . S/P lobectomy of lung 11/29/2015  . Adenoma of liver 11/06/2015  . Genetic testing 08/11/2015  . Recurrent cancer of right breast ([place]) 06/05/2015  . Cancer of overlapping sites of right female breast [facility]) 03/16/2012    SALISBURY,DONNA 06/12/2016, 12:59 PM  [place], [place], [address] Phone: [phone]    Fax:  [phone]  Name: Crystal [name] MRN: [id number] Date of Birth: 31-Jul-1956   PHYSICAL THERAPY DISCHARGE SUMMARY  Visits from Start of Care: 13  Current functional level related to goals / functional outcomes: Goals met as noted above.   Remaining deficits: Some chest and right shoulder tightness remain.   Education / Equipment: Home exercise program for stretching and manual lymph drainage  Plan: Patient agrees to discharge.  Patient goals were met. Patient is being discharged due to meeting the stated rehab goals.  ?????    Serafina Royals, PT 06/12/16 12:59 PM

## 2016-06-14 ENCOUNTER — Other Ambulatory Visit: Payer: Self-pay | Admitting: Oncology

## 2016-06-14 NOTE — Progress Notes (Signed)
ADDENDUM: Bone density study 03/14/2016 at [facility] showed a T score of -2.1, showing osteopenia.

## 2016-06-20 ENCOUNTER — Telehealth: Payer: Self-pay | Admitting: Oncology

## 2016-06-20 NOTE — Telephone Encounter (Signed)
07/11/2016 Appointments canceled per patient request. Patient noticed appointments are identical to 07/08/2016 appointments and does not need two for that week. Patient opted to cancel 07/11/2016 appointments.

## 2016-06-24 DIAGNOSIS — C50911 Malignant neoplasm of unspecified site of right female breast: Secondary | ICD-10-CM | POA: Diagnosis not present

## 2016-07-04 ENCOUNTER — Ambulatory Visit: Payer: BLUE CROSS/BLUE SHIELD | Admitting: Cardiothoracic Surgery

## 2016-07-08 ENCOUNTER — Other Ambulatory Visit (HOSPITAL_BASED_OUTPATIENT_CLINIC_OR_DEPARTMENT_OTHER): Payer: BLUE CROSS/BLUE SHIELD

## 2016-07-08 ENCOUNTER — Ambulatory Visit (HOSPITAL_BASED_OUTPATIENT_CLINIC_OR_DEPARTMENT_OTHER): Payer: BLUE CROSS/BLUE SHIELD

## 2016-07-08 VITALS — BP 120/57 | HR 77 | Temp 98.4°F | Resp 16

## 2016-07-08 DIAGNOSIS — C50911 Malignant neoplasm of unspecified site of right female breast: Secondary | ICD-10-CM

## 2016-07-08 DIAGNOSIS — C50412 Malignant neoplasm of upper-outer quadrant of left female breast: Secondary | ICD-10-CM

## 2016-07-08 DIAGNOSIS — Z5111 Encounter for antineoplastic chemotherapy: Secondary | ICD-10-CM

## 2016-07-08 DIAGNOSIS — C50811 Malignant neoplasm of overlapping sites of right female breast: Secondary | ICD-10-CM

## 2016-07-08 LAB — CBC WITH DIFFERENTIAL/PLATELET
BASO%: 0.6 % (ref 0.0–2.0)
BASOS ABS: 0 10*3/uL (ref 0.0–0.1)
EOS ABS: 0.1 10*3/uL (ref 0.0–0.5)
EOS%: 1.3 % (ref 0.0–7.0)
HEMATOCRIT: 41.3 % (ref 34.8–46.6)
HEMOGLOBIN: 13.5 g/dL (ref 11.6–15.9)
LYMPH#: 1.6 10*3/uL (ref 0.9–3.3)
LYMPH%: 25.3 % (ref 14.0–49.7)
MCH: 28.1 pg (ref 25.1–34.0)
MCHC: 32.6 g/dL (ref 31.5–36.0)
MCV: 86.2 fL (ref 79.5–101.0)
MONO#: 0.4 10*3/uL (ref 0.1–0.9)
MONO%: 6.3 % (ref 0.0–14.0)
NEUT#: 4.2 10*3/uL (ref 1.5–6.5)
NEUT%: 66.5 % (ref 38.4–76.8)
PLATELETS: 220 10*3/uL (ref 145–400)
RBC: 4.79 10*6/uL (ref 3.70–5.45)
RDW: 13.9 % (ref 11.2–14.5)
WBC: 6.3 10*3/uL (ref 3.9–10.3)

## 2016-07-08 LAB — COMPREHENSIVE METABOLIC PANEL
ALBUMIN: 3.9 g/dL (ref 3.5–5.0)
ALK PHOS: 143 U/L (ref 40–150)
ALT: 35 U/L (ref 0–55)
ANION GAP: 10 meq/L (ref 3–11)
AST: 26 U/L (ref 5–34)
BUN: 18.5 mg/dL (ref 7.0–26.0)
CALCIUM: 9.9 mg/dL (ref 8.4–10.4)
CO2: 25 mEq/L (ref 22–29)
Chloride: 106 mEq/L (ref 98–109)
Creatinine: 0.8 mg/dL (ref 0.6–1.1)
EGFR: 76 mL/min/{1.73_m2} — AB (ref >90)
Glucose: 66 mg/dl — ABNORMAL LOW (ref 70–140)
POTASSIUM: 4.4 meq/L (ref 3.5–5.1)
Sodium: 141 mEq/L (ref 136–145)
Total Bilirubin: 0.35 mg/dL (ref 0.20–1.20)
Total Protein: 7.4 g/dL (ref 6.4–8.3)

## 2016-07-08 MED ORDER — FULVESTRANT 250 MG/5ML IM SOLN
500.0000 mg | Freq: Once | INTRAMUSCULAR | Status: AC
  Administered 2016-07-08: 500 mg via INTRAMUSCULAR
  Filled 2016-07-08: qty 10

## 2016-07-08 NOTE — Patient Instructions (Signed)

## 2016-07-11 ENCOUNTER — Other Ambulatory Visit: Payer: BLUE CROSS/BLUE SHIELD

## 2016-07-11 ENCOUNTER — Ambulatory Visit: Payer: BLUE CROSS/BLUE SHIELD

## 2016-07-16 ENCOUNTER — Ambulatory Visit: Payer: BLUE CROSS/BLUE SHIELD | Admitting: Cardiothoracic Surgery

## 2016-07-16 DIAGNOSIS — Z23 Encounter for immunization: Secondary | ICD-10-CM | POA: Diagnosis not present

## 2016-07-18 ENCOUNTER — Ambulatory Visit: Payer: BLUE CROSS/BLUE SHIELD | Admitting: Cardiothoracic Surgery

## 2016-07-23 ENCOUNTER — Ambulatory Visit (INDEPENDENT_AMBULATORY_CARE_PROVIDER_SITE_OTHER): Payer: BLUE CROSS/BLUE SHIELD | Admitting: Cardiothoracic Surgery

## 2016-07-23 ENCOUNTER — Encounter: Payer: Self-pay | Admitting: Cardiothoracic Surgery

## 2016-07-23 VITALS — BP 118/54 | HR 71 | Resp 16 | Ht 66.0 in | Wt 137.0 lb

## 2016-07-23 DIAGNOSIS — Z902 Acquired absence of lung [part of]: Secondary | ICD-10-CM | POA: Diagnosis not present

## 2016-07-23 DIAGNOSIS — C3411 Malignant neoplasm of upper lobe, right bronchus or lung: Secondary | ICD-10-CM

## 2016-07-23 NOTE — Progress Notes (Signed)
[address]       [place] [address]             [phone]      Sadi W Haws [place] Medical Record #[id number] Date of Birth: Apr 29, 1956  Referring: Magrinat, Virgie Dad, MD Primary Care: Kandice Hams, MD  Chief Complaint:   POST OP FOLLOW UP 11/29/2015 OPERATIVE REPORT PREOPERATIVE DIAGNOSIS: Right upper lobe lung mass. POSTOPERATIVE DIAGNOSIS: Right upper lobe lung mass, non-small cell primary lung cancer favored on frozen section. PROCEDURE PERFORMED: Video-assisted flexible bronchoscopy, video- assisted thoracoscopy, wedge resection right upper lobe, completion right upper lobectomy with node dissection and placement of On-Q device. SURGEON: Lanelle Bal, MD.  Cancer of overlapping sites of right female breast [facility])   Staging form: Breast, AJCC 7th Edition     Clinical: Stage IA (T1a, N0, M0) - Signed by Chauncey Cruel, MD on 06/05/2015 Lung cancer, right upper lobe [facility])   Staging form: Lung, AJCC 7th Edition     Pathologic stage from 11/30/2015: Stage IA (T1a, N0, cM0) - Signed by Grace Isaac, MD on 11/30/2015 Recurrent cancer of right breast [facility])   Staging form: Breast, AJCC 7th Edition     Clinical: Stage IA (T1c, N0, M0) - Signed by Chauncey Cruel, MD on 06/05/2015    History of Present Illness:      On 12/04/2015 she underwent video assisted  thoracoscopy, with right upper lobectomy and lymph node dissection. The pathology from this procedure ([id number]) showed an invasive adenocarcinoma, well-differentiated, measuring 1.4 cm, focally present at the initial parenchymal margin, with evidence of lymphovascular invasion, and 1 benign intrapulmonary lymph node. She had additional resection of the right upper lobe which showed no evidence of malignancy and there was a total of 9 additional lymph nodes (6 N1 and 3 N2) all negative. Special studies included estrogen and progesterone receptor as well as gross cystic disease  fluid protein, all negative. The tumor was positive for napsin A and TTF-1-1  Since discharge the patient is been doing well increasing her physical activity appropriately. He's walking up to 20 miles a week. She notes some mild breathlessness if she gets very active or is talking a lot.    Past Medical History:  Diagnosis Date  . Anxiety   . Cancer ([place])    right breast  . Complication of anesthesia    hard to wake up  . GERD (gastroesophageal reflux disease)   . History of small bowel obstruction   . Insomnia   . Liver masses      History  Smoking Status  . Never Smoker  Smokeless Tobacco  . Never Used    History  Alcohol Use  . 0.6 oz/week  . 1 Glasses of wine per week    Comment: social     Allergies  Allergen Reactions  . Promethazine Hcl Other (See Comments)    Causes shaking  . Tylenol [Acetaminophen] Other (See Comments)    Avoids Tylenol products due to liver disease   . Penicillins Rash    Current Outpatient Prescriptions  Medication Sig Dispense Refill  . b complex vitamins tablet Take 1 tablet by mouth daily.      . calcium-vitamin D (OSCAL WITH D) 500-200 MG-UNIT per tablet Take 2 tablets by mouth daily.    . famotidine (PEPCID AC) 10 MG chewable tablet Chew 10 mg by mouth daily as needed for heartburn.    . fulvestrant (FASLODEX) 250  MG/5ML injection Inject 500 mg into the muscle every 30 (thirty) days. One injection each buttock over 1-2 minutes. Warm prior to use.    Marland Kitchen glucosamine-chondroitin 500-400 MG tablet Take 1 tablet by mouth 3 (three) times daily.      Marland Kitchen ibuprofen (ADVIL,MOTRIN) 200 MG tablet Take 200 mg by mouth every 6 (six) hours as needed for fever or mild pain.     . Multiple Vitamins-Minerals (MULTIVITAMIN WITH MINERALS) tablet Take 1 tablet by mouth daily.      . Omega-3 Fatty Acids (FISH OIL) 500 MG CAPS Take 1,000 mg by mouth daily.    . Turmeric 500 MG TABS Take 1,000 mg by mouth daily.     No current facility-administered  medications for this visit.        Physical Exam: BP (!) 118/54   Pulse 71   Resp 16   Ht '5\' 6"'$  (1.676 m)   Wt 137 lb (62.1 kg)   SpO2 98% Comment: ON RA  BMI 22.11 kg/m   General appearance: alert, cooperative and no distress Neurologic: intact Heart: regular rate and rhythm, S1, S2 normal, no murmur, click, rub or gallop Lungs: clear to auscultation bilaterally Abdomen: soft, non-tender; bowel sounds normal; no masses,  no organomegaly Extremities: extremities normal, atraumatic, no cyanosis or edema Wound: Right chest incision and port sites are well-healed and sutures are removed Patient notes that 20 mL of lymph fluid was drained from her mastectomy incision several weeks ago and some slightly returned.  Diagnostic Studies & Laboratory data:     Recent Radiology Findings:    EXAM: CT CHEST WITH CONTRAST  TECHNIQUE: Multidetector CT imaging of the chest was performed during intravenous contrast administration.  CONTRAST:  10m ISOVUE-300 IOPAMIDOL (ISOVUE-300) INJECTION 61%  COMPARISON:  PET-CT 10/31/2015.  FINDINGS: Cardiovascular: The heart size is normal.  No pericardial effusion.  Mediastinum/Nodes: No mediastinal lymphadenopathy. There is no hilar lymphadenopathy. Postsurgical change noted right hilum. The esophagus has normal imaging features. There is no axillary lymphadenopathy.  Lungs/Pleura: Volume loss noted in the right hemi thorax with suture line and scarring in the right apex. Atelectasis or scarring noted at the right base. 3 mm left apical nodule (image 18 series 5) is stable. No focal airspace consolidation. No pulmonary edema or pleural effusion.  Upper Abdomen: As before, numerous lesions are seen scattered through the liver. One of the more dominant lesions seen anteriorly in the left liver measures 2.8 x 5.0 cm today compared to 3.1 x 4.8 cm previously. 3.0 x 4.3 cm lesion in the lateral segment left liver on today's study  measured 3.2 x 4.9 cm previously. Surgical clips are seen along the posterior liver suggesting previous right hepatectomy  Musculoskeletal: Multiple nonacute right rib fractures noted. No worrisome lytic or sclerotic osseous abnormality.  IMPRESSION: 1. Status post right upper lobectomy. Stable CT evaluation of the chest. 2. Small left apical pulmonary nodule is unchanged since 10/31/2015. 3. Multiple liver lesions again noted without substantial interval change.   Electronically Signed   By: EMisty StanleyM.D.   On: 06/07/2016 17:26  Recent Lab Findings: Lab Results  Component Value Date   WBC 6.3 07/08/2016   HGB 13.5 07/08/2016   HCT 41.3 07/08/2016   PLT 220 07/08/2016   GLUCOSE 66 (L) 07/08/2016   ALT 35 07/08/2016   AST 26 07/08/2016   NA 141 07/08/2016   K 4.4 07/08/2016   CL 105 12/03/2015   CREATININE 0.8 07/08/2016   BUN 18.5 07/08/2016  CO2 25 07/08/2016   INR 1.06 11/27/2015      Assessment / Plan:   Stable following right upper lobectomy for stage IA adenocarcinoma of the lung, Patient increasing her physical activity appropriately, Walking up to 35 miles a week. Plan to see her back in 6 months She has a follow-up CT of chest scheduled by oncology in November CT of the chest , now 11 months postop from her lung resection for stage I a adenocarcinoma of the lung.    Grace Isaac MD      [address] [place] [address] Office [phone]   Beeper [phone]  07/23/2016 4:15 PM

## 2016-08-05 ENCOUNTER — Ambulatory Visit (HOSPITAL_BASED_OUTPATIENT_CLINIC_OR_DEPARTMENT_OTHER): Payer: BLUE CROSS/BLUE SHIELD

## 2016-08-05 ENCOUNTER — Other Ambulatory Visit (HOSPITAL_BASED_OUTPATIENT_CLINIC_OR_DEPARTMENT_OTHER): Payer: BLUE CROSS/BLUE SHIELD

## 2016-08-05 VITALS — BP 136/65 | HR 71 | Temp 97.9°F | Resp 18

## 2016-08-05 DIAGNOSIS — Z5111 Encounter for antineoplastic chemotherapy: Secondary | ICD-10-CM

## 2016-08-05 DIAGNOSIS — C50911 Malignant neoplasm of unspecified site of right female breast: Secondary | ICD-10-CM

## 2016-08-05 DIAGNOSIS — C50412 Malignant neoplasm of upper-outer quadrant of left female breast: Secondary | ICD-10-CM

## 2016-08-05 DIAGNOSIS — C50811 Malignant neoplasm of overlapping sites of right female breast: Secondary | ICD-10-CM

## 2016-08-05 LAB — COMPREHENSIVE METABOLIC PANEL
ALBUMIN: 3.9 g/dL (ref 3.5–5.0)
ALK PHOS: 134 U/L (ref 40–150)
ALT: 31 U/L (ref 0–55)
AST: 23 U/L (ref 5–34)
Anion Gap: 9 mEq/L (ref 3–11)
BUN: 16.5 mg/dL (ref 7.0–26.0)
CALCIUM: 9.6 mg/dL (ref 8.4–10.4)
CHLORIDE: 108 meq/L (ref 98–109)
CO2: 23 mEq/L (ref 22–29)
Creatinine: 0.8 mg/dL (ref 0.6–1.1)
EGFR: 86 mL/min/{1.73_m2} — AB (ref >90)
GLUCOSE: 79 mg/dL (ref 70–140)
POTASSIUM: 4.4 meq/L (ref 3.5–5.1)
SODIUM: 141 meq/L (ref 136–145)
Total Bilirubin: 0.37 mg/dL (ref 0.20–1.20)
Total Protein: 7.4 g/dL (ref 6.4–8.3)

## 2016-08-05 LAB — CBC WITH DIFFERENTIAL/PLATELET
BASO%: 0.9 % (ref 0.0–2.0)
BASOS ABS: 0 10*3/uL (ref 0.0–0.1)
EOS ABS: 0.1 10*3/uL (ref 0.0–0.5)
EOS%: 1.1 % (ref 0.0–7.0)
HCT: 40.9 % (ref 34.8–46.6)
HEMOGLOBIN: 13.2 g/dL (ref 11.6–15.9)
LYMPH%: 28.5 % (ref 14.0–49.7)
MCH: 28.1 pg (ref 25.1–34.0)
MCHC: 32.4 g/dL (ref 31.5–36.0)
MCV: 87 fL (ref 79.5–101.0)
MONO#: 0.3 10*3/uL (ref 0.1–0.9)
MONO%: 5.5 % (ref 0.0–14.0)
NEUT#: 3.3 10*3/uL (ref 1.5–6.5)
NEUT%: 64 % (ref 38.4–76.8)
Platelets: 209 10*3/uL (ref 145–400)
RBC: 4.7 10*6/uL (ref 3.70–5.45)
RDW: 14.5 % (ref 11.2–14.5)
WBC: 5.2 10*3/uL (ref 3.9–10.3)
lymph#: 1.5 10*3/uL (ref 0.9–3.3)

## 2016-08-05 MED ORDER — FULVESTRANT 250 MG/5ML IM SOLN
500.0000 mg | Freq: Once | INTRAMUSCULAR | Status: AC
  Administered 2016-08-05: 500 mg via INTRAMUSCULAR
  Filled 2016-08-05: qty 10

## 2016-08-05 NOTE — Patient Instructions (Signed)

## 2016-08-30 ENCOUNTER — Other Ambulatory Visit: Payer: Self-pay | Admitting: *Deleted

## 2016-09-02 ENCOUNTER — Ambulatory Visit (HOSPITAL_BASED_OUTPATIENT_CLINIC_OR_DEPARTMENT_OTHER): Payer: BLUE CROSS/BLUE SHIELD | Admitting: Oncology

## 2016-09-02 ENCOUNTER — Other Ambulatory Visit (HOSPITAL_BASED_OUTPATIENT_CLINIC_OR_DEPARTMENT_OTHER): Payer: BLUE CROSS/BLUE SHIELD

## 2016-09-02 ENCOUNTER — Ambulatory Visit (HOSPITAL_COMMUNITY)
Admission: RE | Admit: 2016-09-02 | Discharge: 2016-09-02 | Disposition: A | Discharge status: Home / Self Care | Payer: BLUE CROSS/BLUE SHIELD | Source: Ambulatory Visit | Attending: Oncology | Admitting: Oncology

## 2016-09-02 ENCOUNTER — Ambulatory Visit (HOSPITAL_BASED_OUTPATIENT_CLINIC_OR_DEPARTMENT_OTHER): Payer: BLUE CROSS/BLUE SHIELD

## 2016-09-02 VITALS — BP 116/53 | HR 78 | Temp 98.0°F | Resp 18 | Wt 138.0 lb

## 2016-09-02 DIAGNOSIS — C3411 Malignant neoplasm of upper lobe, right bronchus or lung: Secondary | ICD-10-CM

## 2016-09-02 DIAGNOSIS — C50911 Malignant neoplasm of unspecified site of right female breast: Secondary | ICD-10-CM

## 2016-09-02 DIAGNOSIS — C50811 Malignant neoplasm of overlapping sites of right female breast: Secondary | ICD-10-CM

## 2016-09-02 DIAGNOSIS — J90 Pleural effusion, not elsewhere classified: Secondary | ICD-10-CM | POA: Diagnosis not present

## 2016-09-02 DIAGNOSIS — Z5111 Encounter for antineoplastic chemotherapy: Secondary | ICD-10-CM

## 2016-09-02 DIAGNOSIS — R918 Other nonspecific abnormal finding of lung field: Secondary | ICD-10-CM | POA: Diagnosis not present

## 2016-09-02 DIAGNOSIS — Z170 Estrogen receptor positive status [ER+]: Secondary | ICD-10-CM

## 2016-09-02 LAB — COMPREHENSIVE METABOLIC PANEL
ALT: 27 U/L (ref 0–55)
AST: 22 U/L (ref 5–34)
Albumin: 3.8 g/dL (ref 3.5–5.0)
Alkaline Phosphatase: 127 U/L (ref 40–150)
Anion Gap: 9 mEq/L (ref 3–11)
BUN: 14 mg/dL (ref 7.0–26.0)
CALCIUM: 9.6 mg/dL (ref 8.4–10.4)
CHLORIDE: 105 meq/L (ref 98–109)
CO2: 27 meq/L (ref 22–29)
CREATININE: 0.8 mg/dL (ref 0.6–1.1)
EGFR: 82 mL/min/{1.73_m2} — ABNORMAL LOW (ref >90)
Glucose: 139 mg/dl (ref 70–140)
POTASSIUM: 4.2 meq/L (ref 3.5–5.1)
SODIUM: 141 meq/L (ref 136–145)
Total Bilirubin: 0.37 mg/dL (ref 0.20–1.20)
Total Protein: 7 g/dL (ref 6.4–8.3)

## 2016-09-02 LAB — CBC WITH DIFFERENTIAL/PLATELET
BASO%: 0.9 % (ref 0.0–2.0)
BASOS ABS: 0 10*3/uL (ref 0.0–0.1)
EOS%: 1.6 % (ref 0.0–7.0)
Eosinophils Absolute: 0.1 10*3/uL (ref 0.0–0.5)
HCT: 40.2 % (ref 34.8–46.6)
HGB: 13.1 g/dL (ref 11.6–15.9)
LYMPH#: 1.2 10*3/uL (ref 0.9–3.3)
LYMPH%: 24.9 % (ref 14.0–49.7)
MCH: 28.4 pg (ref 25.1–34.0)
MCHC: 32.5 g/dL (ref 31.5–36.0)
MCV: 87.2 fL (ref 79.5–101.0)
MONO#: 0.2 10*3/uL (ref 0.1–0.9)
MONO%: 4.4 % (ref 0.0–14.0)
NEUT#: 3.3 10*3/uL (ref 1.5–6.5)
NEUT%: 68.2 % (ref 38.4–76.8)
Platelets: 205 10*3/uL (ref 145–400)
RBC: 4.61 10*6/uL (ref 3.70–5.45)
RDW: 13.8 % (ref 11.2–14.5)
WBC: 4.8 10*3/uL (ref 3.9–10.3)

## 2016-09-02 MED ORDER — FULVESTRANT 250 MG/5ML IM SOLN
500.0000 mg | Freq: Once | INTRAMUSCULAR | Status: AC
  Administered 2016-09-02: 500 mg via INTRAMUSCULAR
  Filled 2016-09-02: qty 10

## 2016-09-02 NOTE — Progress Notes (Signed)
ID: Crystal [name]   DOB: [birth year]  MR#: [id number]  GGY#:[id number]  PCP: Kandice Hams, MD GYN: SU: [name] MD OTHER MD: Earle Gell, Daneen Schick, Arnoldo Hooker Thimmappa]  CHIEF COMPLAINT: recurrent estrogen receptor positive breast cancer in previously irradiated breast; pT1a lung cancer  CURRENT TREATMENT: Fulvestrant  BREAST CANCER HISTORY:   From the original intake note:  The patient had bilateral diagnostic mammography 02/12/2011.  She has a history of cystic changes noted on prior studies in April 2011 and April 2010.  On the Feb 12, 2011 study there were new calcifications identified superiorly in the right breast, without any obvious mass.  The patient was brought back for further studies, 02/19/2011 and Dr. Marcelo Baldy was able to localize the calcifications under stereotactic guidance.  The biopsy ([id number]) showed high-grade ductal carcinoma with papillary features which was 70% estrogen receptor positive, 0% progesterone receptor positive.  There was no definitive invasion and therefore further studies were not performed.   Bilateral breast MRIs were obtained Feb 26, 2011.  This showed marked nodular enhancement throughout both breasts, and in the upper central portion there was a post biopsy area of change measuring up to 3.7 cm.  Most of this is a fluid cavity with a thin rim of enhancement.  There were no suspicious areas in the left breast.  No internal mammary or axillary lymph nodes.  Of course, the patient has a history of hepatic adenomas and these were noted incidentally on the MRI.   Her definitive surgical treatment and subsequent history are as detailed below.  INTERVAL HISTORY: Crystal [name] returns today for follow up of her estrogen receptor positive breast cancer and her early stage lung cancer. She continues on fulvestrant, which she is tolerating better. Initially it was difficult for her to tell apart symptoms from her recent surgery as compared to the  fulvestrant. Now she does have some fatigue at times which she attributes to the drug. Otherwise she tolerates it well.  REVIEW OF SYSTEMS: She has had some unusual feeling in her feet like she is walking on the labs. In fact she thought her husband had broken something and she had stepped on it. In fact there was no blasts. This was only in 1 foot and it only lasted at most on our. About a week later should she had a very similar feeling in the other foot. She tells me she is walking about 30 miles a week. She says she has very comfortable shoes. Recently they took some friends to the [employer] and she had to do some climbing and she felt short of breath. She had to sit down. She does very well with flat areas. Occasionally she has a little bit of a dry cough, mostly at night when she lies down or early in the morning when she wakes up aside from these issues a detailed review of systems today was stable  PAST MEDICAL HISTORY: Past Medical History:  Diagnosis Date  . Anxiety   . Cancer ([place])    right breast  . Complication of anesthesia    hard to wake up  . GERD (gastroesophageal reflux disease)   . History of small bowel obstruction   . Insomnia   . Liver masses   history of small-bowel obstruction with partial colectomy March 2001.  The patient has a history of hepatic adenomas.  She has had liver resections x2.  The data for this is at [facility] and we will try to obtain it.  She has  also had incisional hernia repair.  She has a history of osteopenia, and she has a history of multiple anti-red cell antibodies making a transfusion difficult.  The patient tells me that she has a history of low calcium and a tendency to get liver injections.  PAST SURGICAL HISTORY: Past Surgical History:  Procedure Laterality Date  . BREAST SURGERY Right   . HERNIA REPAIR     incisional hernia repair x3  . laparotomies    . LIVER SURGERY    . LOBECTOMY Right 11/29/2015   Procedure: Completion Right  Upper  LOBECTOMY, Lymph node dissection, on Q placement;  Surgeon: Grace Isaac, MD;  Location: [place];  Service: Thoracic;  Laterality: Right;  . MASTECTOMY W/ SENTINEL NODE BIOPSY Right 07/05/2015   Procedure: RIGHT MASTECTOMY WITH SENTINEL NODE BIOPSY AND PORT A CATH INSERTION;  Surgeon: [name], MD;  Location: [place];  Service: General;  Laterality: Right;  . PORTACATH PLACEMENT Left 07/05/2015   Procedure: INSERTION PORT-A-CATH;  Surgeon: [name], MD;  Location: [place];  Service: General;  Laterality: Left;  Marland Kitchen VIDEO ASSISTED THORACOSCOPY (VATS)/WEDGE RESECTION Right 11/29/2015   Procedure: BRONCHOSCOPY , VIDEO ASSISTED THORACOSCOPY (VATS)/WEDGE RESECTION;  Surgeon: Grace Isaac, MD;  Location: [facility];  Service: Thoracic;  Laterality: Right;    FAMILY HISTORY Family History  Problem Relation Age of Onset  . Ovarian cancer Mother 12    "metastasized to breasts"  . Breast cancer Maternal Grandmother 48    2-3 recurrences  . Stomach cancer Maternal Grandfather     dx. 47-49; metastatic stomach cancer  . Colon polyps Father     about 2-3 polyps removed at each colonoscopy  . Colon polyps Brother     "a few"  . Other Maternal Aunt     +TAH  . Colon cancer Paternal Aunt 90  . Brain cancer Daughter 17    astrocytoma  . Breast cancer Cousin     dx. 50s  The patient's mother died from ovarian cancer at the age of 43.  The patient's father is alive at age 31.  She has 2 brothers, no sisters.  A maternal grandmother was diagnosed with breast cancer at the age of 7, and the maternal grandfather had stomach cancer at the age of 28.  GYNECOLOGIC HISTORY: The patient had menarche at age 3.  Her [name] menstrual period was in 2005.  She never used hormone replacement.  She used birth control briefly to control bleeding prior to 1980.  She was 60 years old at the birth of her first child.  She started having mammograms at age 50 because  of a strong family history.  SOCIAL HISTORY: Iysis works as a Programmer, applications for [employer].   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  Son, [name], lives in [place] and works for that [employer]  Daughter, [name], lives in [place] and is a homemaker.   ADVANCED DIRECTIVES: in place  HEALTH MAINTENANCE: Social History  Substance Use Topics  . Smoking status: Never Smoker  . Smokeless tobacco: Never Used  . Alcohol use 0.6 oz/week    1 Glasses of wine per week     Comment: social     Colonoscopy:  PAP:  Bone density:  Lipid panel:  Allergies  Allergen Reactions  . Promethazine Hcl Other (See Comments)  Causes shaking  . Tylenol [Acetaminophen] Other (See Comments)    Avoids Tylenol products due to liver disease   . Penicillins Rash    Current Outpatient Prescriptions  Medication Sig Dispense Refill  . b complex vitamins tablet Take 1 tablet by mouth daily.      . calcium-vitamin D (OSCAL WITH D) 500-200 MG-UNIT per tablet Take 2 tablets by mouth daily.    . famotidine (PEPCID AC) 10 MG chewable tablet Chew 10 mg by mouth daily as needed for heartburn.    . fulvestrant (FASLODEX) 250 MG/5ML injection Inject 500 mg into the muscle every 30 (thirty) days. One injection each buttock over 1-2 minutes. Warm prior to use.    Marland Kitchen glucosamine-chondroitin 500-400 MG tablet Take 1 tablet by mouth 3 (three) times daily.      Marland Kitchen ibuprofen (ADVIL,MOTRIN) 200 MG tablet Take 200 mg by mouth every 6 (six) hours as needed for fever or mild pain.     . Multiple Vitamins-Minerals (MULTIVITAMIN WITH MINERALS) tablet Take 1 tablet by mouth daily.      . Omega-3 Fatty Acids (FISH OIL) 500 MG CAPS Take 1,000 mg by mouth daily.    . Turmeric 500 MG TABS Take 1,000 mg by mouth daily.     No current facility-administered medications for this visit.     OBJECTIVE:  Middle-aged white woman Who appears stated age 60:   09/02/16 0936  BP: (!) 116/53  Pulse: 78  Resp: 18  Temp: 98 F (36.7 C)     Body mass index is 22.27 kg/m.    ECOG FS: 1  Sclerae unicteric, EOMs intact Oropharynx clear and moist No cervical or supraclavicular adenopathy Lungs no rales or rhonchi Heart regular rate and rhythm Abd soft, nontender, positive bowel sounds MSK no focal spinal tenderness, no upper extremity lymphedema Neuro: nonfocal, well oriented, appropriate affect Breasts: The right breast is status post mastectomy. There is no evidence of active recurrence. The right axilla is benign. The left breast is unremarkable .   LAB RESULTS: Lab Results  Component Value Date   WBC 4.8 09/02/2016   NEUTROABS 3.3 09/02/2016   HGB 13.1 09/02/2016   HCT 40.2 09/02/2016   MCV 87.2 09/02/2016   PLT 205 09/02/2016      Chemistry      Component Value Date/Time   NA 141 09/02/2016 0920   K 4.2 09/02/2016 0920   CL 105 12/03/2015 0430   CL 102 03/15/2013 0912   CO2 27 09/02/2016 0920   BUN 14.0 09/02/2016 0920   CREATININE 0.8 09/02/2016 0920      Component Value Date/Time   CALCIUM 9.6 09/02/2016 0920   ALKPHOS 127 09/02/2016 0920   AST 22 09/02/2016 0920   ALT 27 09/02/2016 0920   BILITOT 0.37 09/02/2016 0920       Lab Results  Component Value Date   LABCA2 28 10/20/2012    No components found for: [id number]  No results for input(s): INR in the [name] 168 hours.  Urinalysis    Component Value Date/Time   COLORURINE YELLOW 11/27/2015 1403    STUDIES: Dg Chest 2 View  Result Date: 09/02/2016 CLINICAL DATA:  Lung surgery. EXAM: CHEST  2 VIEW COMPARISON:  06/07/2016 . FINDINGS: Mediastinum and hilar structures normal. Heart size normal. Surgical clips right perihilar region and right upper abdomen. Small right pleural effusion and/or pleural scarring noted. Surgical clips right upper abdomen. No acute bony abnormality . IMPRESSION: 1. Right  base pleural thickening again  noted consistent with scarring. 2.  No acute pulmonary disease. Electronically Signed   By: Marcello Moores  Register   On: 09/02/2016 08:52    ASSESSMENT: 60 y.o.  BRCA 1-2 and p-53 negative [place] woman status post right lumpectomy and sentinel lymph node sampling June 2012 for a T1a N0, Stage IA  invasive ductal carcinoma, grade 3, estrogen receptor 98% positive, progesterone receptor 97% positive, with an MIB-1 of 5% and no HER-2 amplification   (a) note that the initial Right breast biopsy showed DCIS w papillary architecture, estrogen receptor 70% positive, progesterone receptor negative  (1) completed radiation September 2012.   (2) on letrozole starting July 2012, discontinued August 2016 with recurrence  RECURRENT DISEASE: (3) right breast upper outer quadrant biopsy 05/23/2015 was positive for a clinical T1c N0, stage IA invasive ductal carcinoma, grade 2, estrogen receptor 60% positive with moderate staining intensity, progesterone receptor negative, with no HER-2 amplification and an MIB-1 of 90%.  (4) status post right simple mastectomy 07/05/2015 for a pT1c pN0, stage IA invasive ductal carcinoma, grade 3, with negative margins. 4 lymph nodes removed with mastectomy were benign.  (a) the patient is not planning on reconstruction  (5) began cyclophosphamide and docetaxel 07/26/2015, repeated every 21 days 4, final dose 09/26/2015  (6) fulvestrant started 10/23/2015   (7) genetics testing 07/20/2015 was normal and did not reveal a deleterious mutation in ATM, BARD1, BRCA1, BRCA2, BRIP1, CDH1, CHEK2, FANCC, MLH1, MSH2, MSH6, NBN, PALB2, PMS2, PTEN, RAD51C, RAD51D, TP53, and XRCC2. Additionally no variant of uncertain significance were found.   LUNG CANCER: (8) RUL spiculated nodule measuring 1.4 cm noted on staging CT scan 06/13/2015-- stable on repeat CT 07/25/15--moderately hot and slightly larger on PET 10/31/2015  (a) status post right upper  lobectomy and lymph node dissection 11/29/2015 for a pT1a pN0, stage IA adenocarcinoma, low-grade, with negative margins   (9) history of multiple hepatic adenomas    PLAN: Crystal [name] is now a little over a year from definitive surgery for her recurrent breast cancer, with no evidence of active disease. This is very favorable area  She is tolerating the fulvestrant well. I don't think the pain she is having in her feet are going to be due to that. They certainly don't sound like peripheral neuropathy. I think is going to be related to her walking, and I have asked her to keep some Exactly When These Symptoms Occur so That We Can Try to Correlate with Her Activity.  As Far As Her Lung Cancer Is Concerned She Is Now 9 Months out from Her Definitive Surgery and Her Chest X-Ray Is Clear.  The Plan Is to Continue the Fulvestrant Every 28 Days Indefinitely. She Will See Me in 3 Months and We Will Repeat a Chest X-Ray at That Time.  She Knows to Call for Any Problems That May Develop before Her Next Visit Here. MAGRINAT,GUSTAV C    09/02/2016

## 2016-09-02 NOTE — Patient Instructions (Signed)

## 2016-09-30 ENCOUNTER — Other Ambulatory Visit (HOSPITAL_BASED_OUTPATIENT_CLINIC_OR_DEPARTMENT_OTHER): Payer: BLUE CROSS/BLUE SHIELD

## 2016-09-30 ENCOUNTER — Ambulatory Visit (HOSPITAL_BASED_OUTPATIENT_CLINIC_OR_DEPARTMENT_OTHER): Payer: BLUE CROSS/BLUE SHIELD

## 2016-09-30 VITALS — BP 126/76 | HR 71 | Temp 98.2°F | Resp 16

## 2016-09-30 DIAGNOSIS — C50911 Malignant neoplasm of unspecified site of right female breast: Secondary | ICD-10-CM | POA: Diagnosis not present

## 2016-09-30 DIAGNOSIS — C50811 Malignant neoplasm of overlapping sites of right female breast: Secondary | ICD-10-CM

## 2016-09-30 DIAGNOSIS — Z5111 Encounter for antineoplastic chemotherapy: Secondary | ICD-10-CM

## 2016-09-30 LAB — COMPREHENSIVE METABOLIC PANEL
ALT: 37 U/L (ref 0–55)
ANION GAP: 9 meq/L (ref 3–11)
AST: 25 U/L (ref 5–34)
Albumin: 3.8 g/dL (ref 3.5–5.0)
Alkaline Phosphatase: 136 U/L (ref 40–150)
BILIRUBIN TOTAL: 0.36 mg/dL (ref 0.20–1.20)
BUN: 12.3 mg/dL (ref 7.0–26.0)
CALCIUM: 9.5 mg/dL (ref 8.4–10.4)
CHLORIDE: 106 meq/L (ref 98–109)
CO2: 25 mEq/L (ref 22–29)
CREATININE: 0.8 mg/dL (ref 0.6–1.1)
EGFR: 83 mL/min/{1.73_m2} — ABNORMAL LOW (ref >90)
Glucose: 82 mg/dl (ref 70–140)
Potassium: 4 mEq/L (ref 3.5–5.1)
Sodium: 140 mEq/L (ref 136–145)
TOTAL PROTEIN: 7.1 g/dL (ref 6.4–8.3)

## 2016-09-30 LAB — CBC WITH DIFFERENTIAL/PLATELET
BASO%: 0.7 % (ref 0.0–2.0)
Basophils Absolute: 0 10*3/uL (ref 0.0–0.1)
EOS%: 1.6 % (ref 0.0–7.0)
Eosinophils Absolute: 0.1 10*3/uL (ref 0.0–0.5)
HEMATOCRIT: 41.6 % (ref 34.8–46.6)
HEMOGLOBIN: 13.4 g/dL (ref 11.6–15.9)
LYMPH#: 1.7 10*3/uL (ref 0.9–3.3)
LYMPH%: 25.6 % (ref 14.0–49.7)
MCH: 28.3 pg (ref 25.1–34.0)
MCHC: 32.3 g/dL (ref 31.5–36.0)
MCV: 87.5 fL (ref 79.5–101.0)
MONO#: 0.4 10*3/uL (ref 0.1–0.9)
MONO%: 5.8 % (ref 0.0–14.0)
NEUT%: 66.3 % (ref 38.4–76.8)
NEUTROS ABS: 4.4 10*3/uL (ref 1.5–6.5)
PLATELETS: 202 10*3/uL (ref 145–400)
RBC: 4.75 10*6/uL (ref 3.70–5.45)
RDW: 13.5 % (ref 11.2–14.5)
WBC: 6.6 10*3/uL (ref 3.9–10.3)

## 2016-09-30 MED ORDER — FULVESTRANT 250 MG/5ML IM SOLN
500.0000 mg | Freq: Once | INTRAMUSCULAR | Status: AC
  Administered 2016-09-30: 500 mg via INTRAMUSCULAR
  Filled 2016-09-30: qty 10

## 2016-09-30 NOTE — Patient Instructions (Signed)

## 2016-10-08 IMAGING — CT CT CHEST W/ CM
2 of 3 series · 15 of 36 positions shown, 18 images · IV contrast (ISOVUE)
Comparison: PET-CT 10/31/2015.

CLINICAL DATA: Breast cancer and lung cancer.

EXAM:
CT CHEST WITH CONTRAST
TECHNIQUE: Multidetector CT imaging of the chest was performed during
intravenous contrast administration.
CONTRAST:  75mL [id number] IOPAMIDOL ([id number]) INJECTION 61%

[Series 2: chest with st · axial · 0.60mm/px · z∈[-354,-74]mm · 12 of 166 slices shown, 15 images]
[im 13/166  mediastinal]
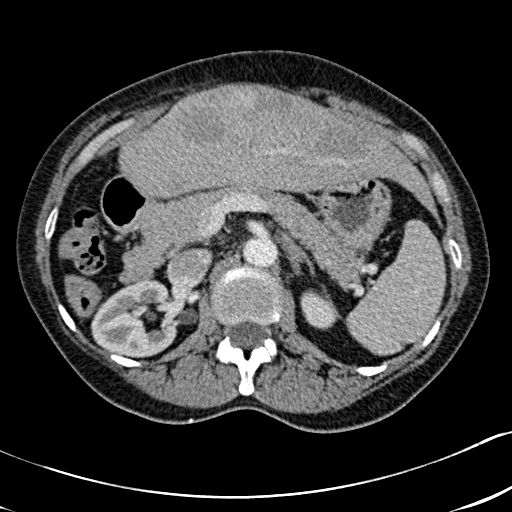
[im 13/166  lung]
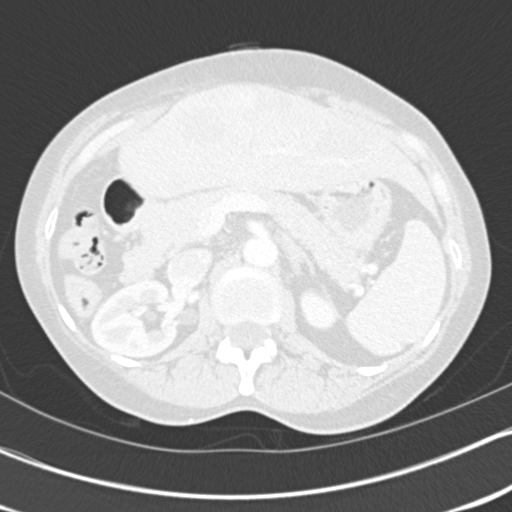
[im 25/166  lung]
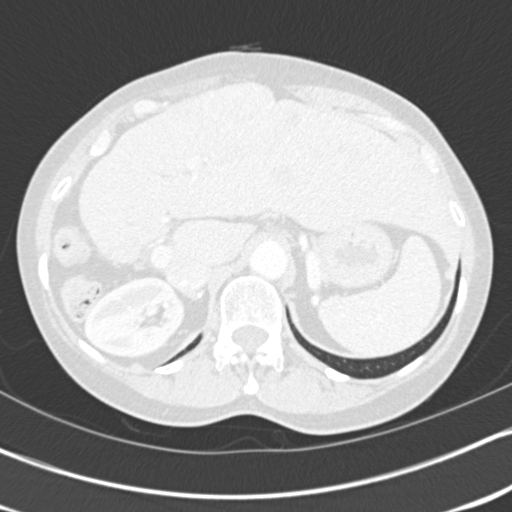
[im 37/166  lung]
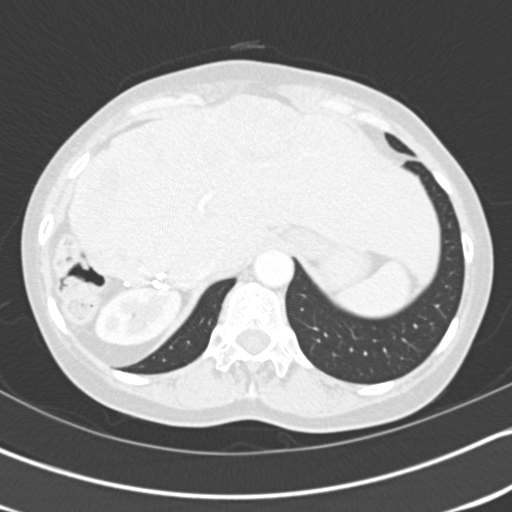
[im 49/166  lung]
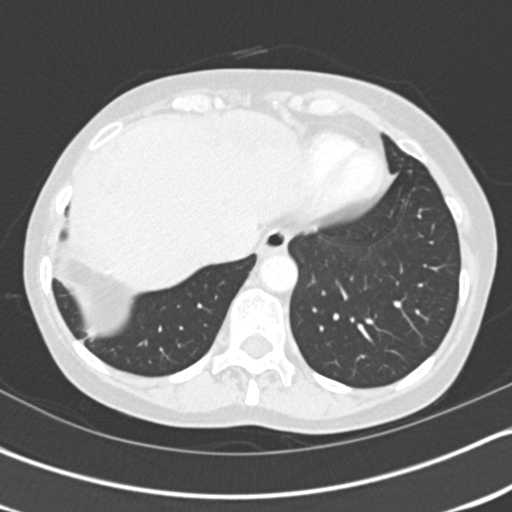
[im 62/166  mediastinal]
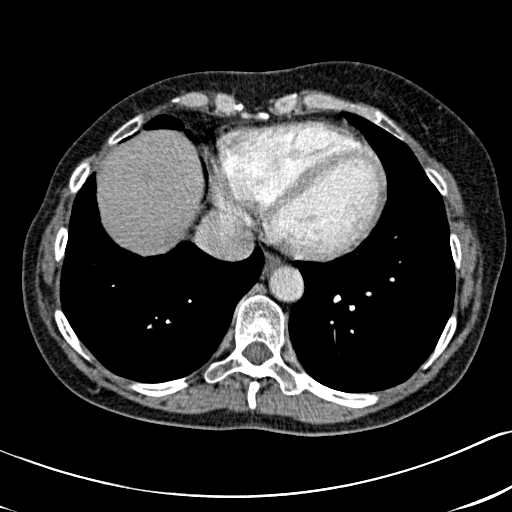
[im 62/166  lung]
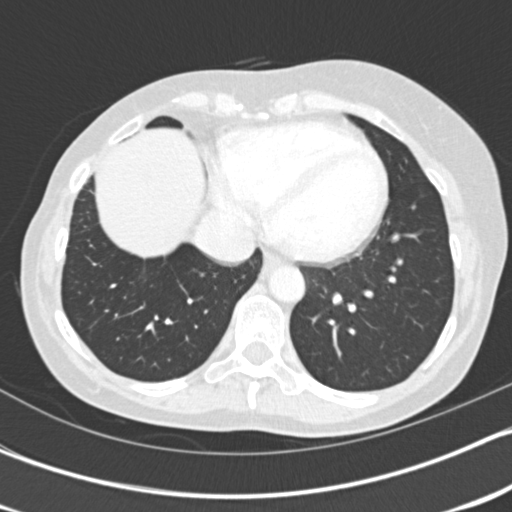
[im 74/166  lung]
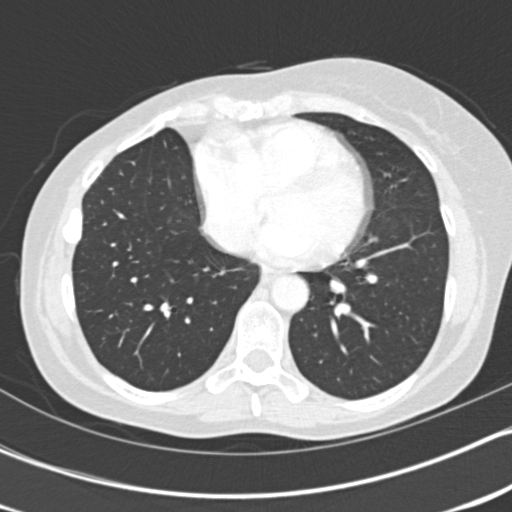
[im 92/166  lung]
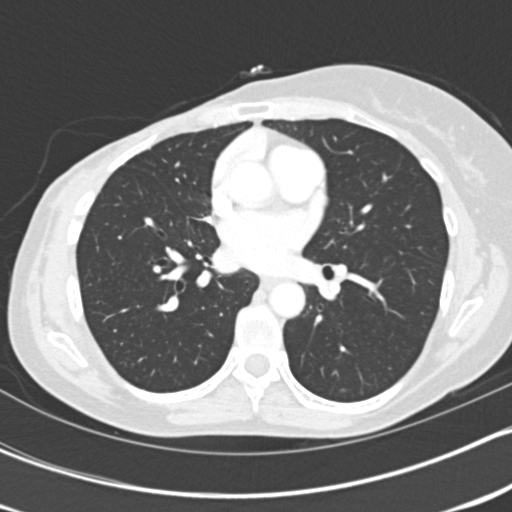
[im 104/166  lung]
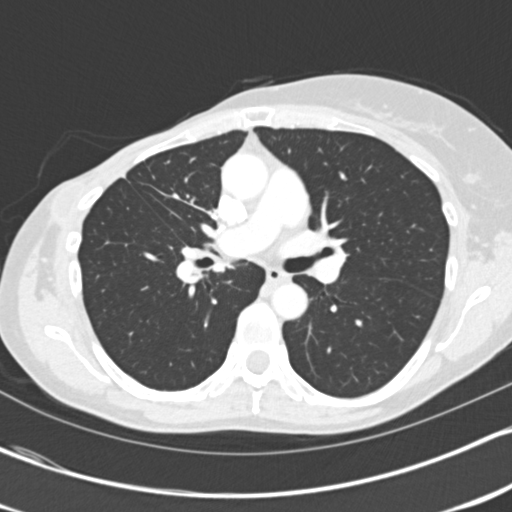
[im 117/166  mediastinal]
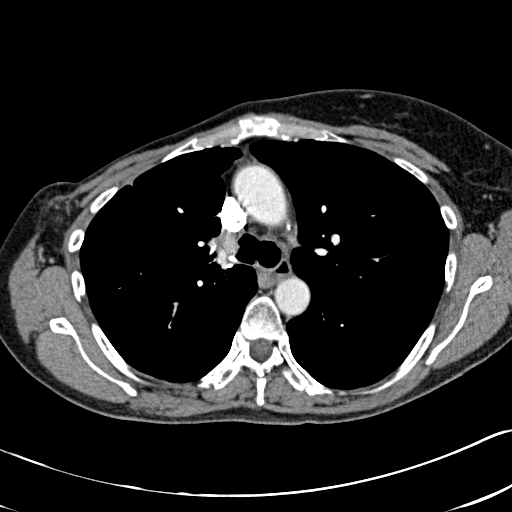
[im 117/166  lung]
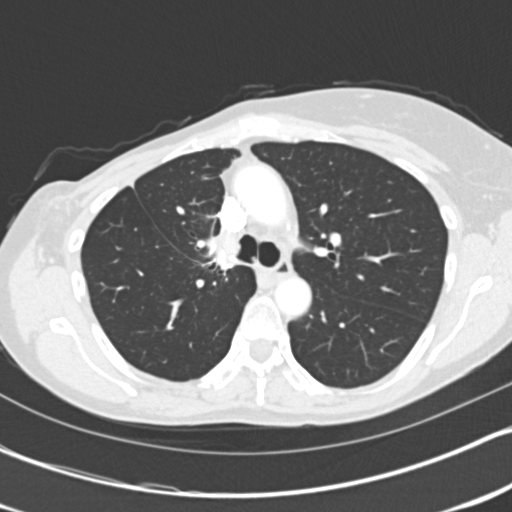
[im 129/166  lung]
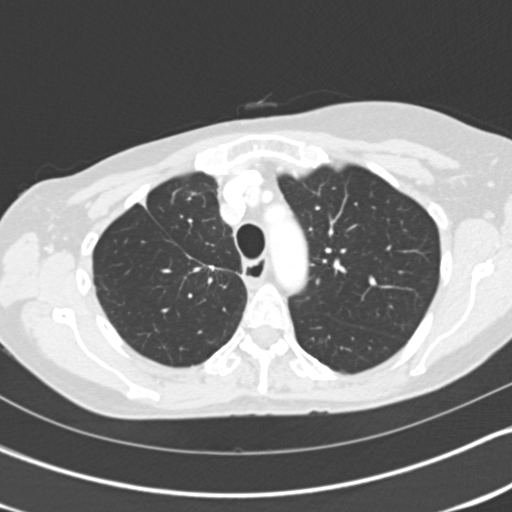
[im 141/166  lung]
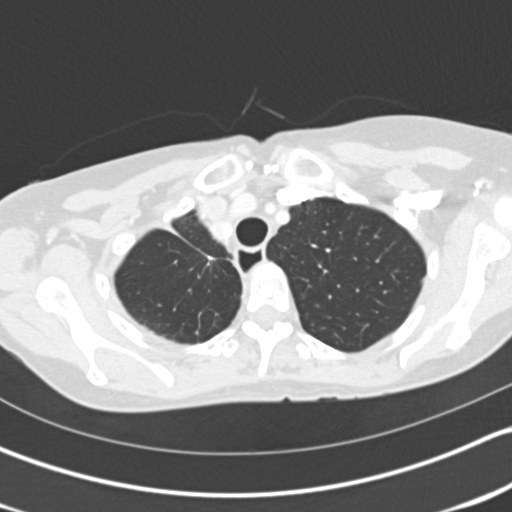
[im 153/166  lung]
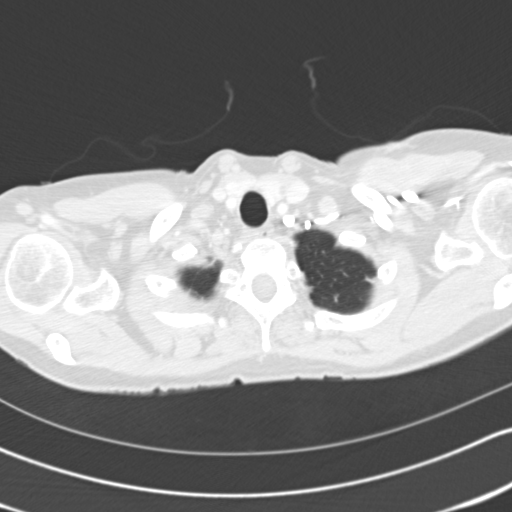

[Series 603: <mpr thick range> · coronal · 0.65mm/px · 3 of 118 slices shown]
[im 24/118  lung]
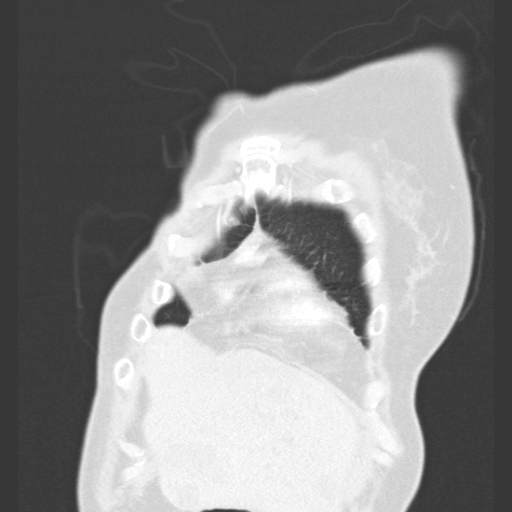
[im 47/118  lung]
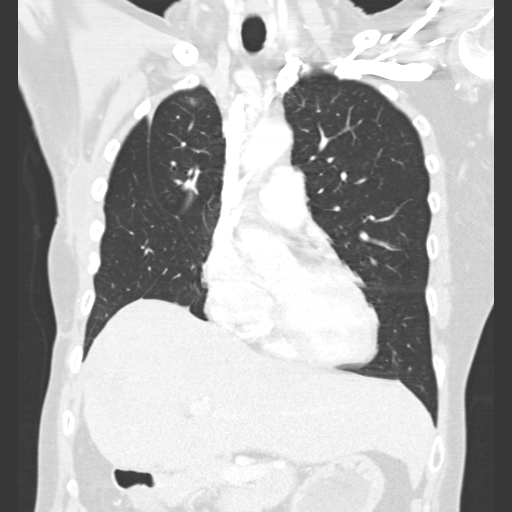
[im 71/118  lung]
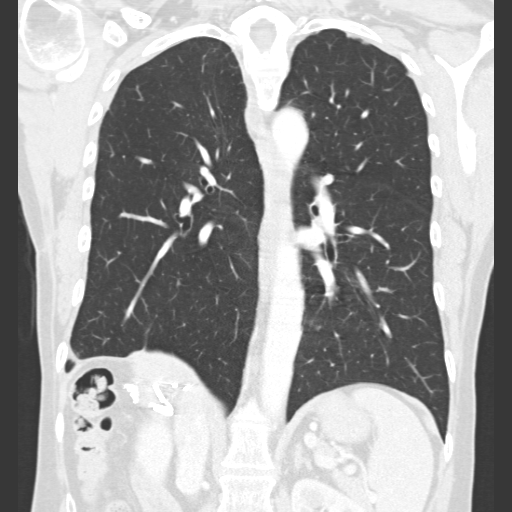

[15 of 36 positions shown; findings below may reference images not displayed]

FINDINGS: Cardiovascular: The heart size is normal.  No pericardial effusion.

Mediastinum/Nodes: No mediastinal lymphadenopathy. There is no hilar
lymphadenopathy. Postsurgical change noted right hilum. The
esophagus has normal imaging features. There is no axillary
lymphadenopathy.

Lungs/Pleura: Volume loss noted in the right hemi thorax with suture
line and scarring in the right apex. Atelectasis or scarring noted
at the right base. 3 mm left apical nodule (image 18 series 5) is
stable. No focal airspace consolidation. No pulmonary edema or
pleural effusion.

Upper Abdomen: As before, numerous lesions are seen scattered
through the liver. One of the more dominant lesions seen anteriorly
in the left liver measures 2.8 x 5.0 cm today compared to 3.1 x
cm previously. 3.0 x 4.3 cm lesion in the lateral segment left liver
on today's study measured 3.2 x 4.9 cm previously. Surgical clips
are seen along the posterior liver suggesting previous right
hepatectomy

Musculoskeletal: Multiple nonacute right rib fractures noted. No
worrisome lytic or sclerotic osseous abnormality.
IMPRESSION: 1. Status post right upper lobectomy. Stable CT evaluation of the
chest.
2. Small left apical pulmonary nodule is unchanged since 10/31/2015.
[DATE]. Multiple liver lesions again noted without substantial interval
change.

## 2016-10-28 ENCOUNTER — Other Ambulatory Visit (HOSPITAL_BASED_OUTPATIENT_CLINIC_OR_DEPARTMENT_OTHER): Payer: BLUE CROSS/BLUE SHIELD

## 2016-10-28 ENCOUNTER — Ambulatory Visit (HOSPITAL_BASED_OUTPATIENT_CLINIC_OR_DEPARTMENT_OTHER): Payer: BLUE CROSS/BLUE SHIELD

## 2016-10-28 VITALS — BP 128/54 | HR 70 | Temp 97.8°F | Resp 18

## 2016-10-28 DIAGNOSIS — C50811 Malignant neoplasm of overlapping sites of right female breast: Secondary | ICD-10-CM

## 2016-10-28 DIAGNOSIS — Z5111 Encounter for antineoplastic chemotherapy: Secondary | ICD-10-CM | POA: Diagnosis not present

## 2016-10-28 DIAGNOSIS — C50911 Malignant neoplasm of unspecified site of right female breast: Secondary | ICD-10-CM | POA: Diagnosis not present

## 2016-10-28 LAB — CBC WITH DIFFERENTIAL/PLATELET
BASO%: 0.4 % (ref 0.0–2.0)
Basophils Absolute: 0 10*3/uL (ref 0.0–0.1)
EOS%: 1.8 % (ref 0.0–7.0)
Eosinophils Absolute: 0.1 10*3/uL (ref 0.0–0.5)
HCT: 41.1 % (ref 34.8–46.6)
HGB: 13.3 g/dL (ref 11.6–15.9)
LYMPH%: 25.4 % (ref 14.0–49.7)
MCH: 28.5 pg (ref 25.1–34.0)
MCHC: 32.4 g/dL (ref 31.5–36.0)
MCV: 88.2 fL (ref 79.5–101.0)
MONO#: 0.3 10*3/uL (ref 0.1–0.9)
MONO%: 4.4 % (ref 0.0–14.0)
NEUT#: 4.8 10*3/uL (ref 1.5–6.5)
NEUT%: 68 % (ref 38.4–76.8)
PLATELETS: 209 10*3/uL (ref 145–400)
RBC: 4.66 10*6/uL (ref 3.70–5.45)
RDW: 13.6 % (ref 11.2–14.5)
WBC: 7.1 10*3/uL (ref 3.9–10.3)
lymph#: 1.8 10*3/uL (ref 0.9–3.3)

## 2016-10-28 LAB — COMPREHENSIVE METABOLIC PANEL
ALT: 30 U/L (ref 0–55)
ANION GAP: 10 meq/L (ref 3–11)
AST: 22 U/L (ref 5–34)
Albumin: 4 g/dL (ref 3.5–5.0)
Alkaline Phosphatase: 129 U/L (ref 40–150)
BUN: 15.6 mg/dL (ref 7.0–26.0)
CHLORIDE: 104 meq/L (ref 98–109)
CO2: 25 meq/L (ref 22–29)
CREATININE: 0.8 mg/dL (ref 0.6–1.1)
Calcium: 9.6 mg/dL (ref 8.4–10.4)
EGFR: 86 mL/min/{1.73_m2} — AB (ref >90)
GLUCOSE: 74 mg/dL (ref 70–140)
Potassium: 4.6 mEq/L (ref 3.5–5.1)
SODIUM: 139 meq/L (ref 136–145)
TOTAL PROTEIN: 7 g/dL (ref 6.4–8.3)
Total Bilirubin: 0.36 mg/dL (ref 0.20–1.20)

## 2016-10-28 MED ORDER — FULVESTRANT 250 MG/5ML IM SOLN
500.0000 mg | Freq: Once | INTRAMUSCULAR | Status: AC
  Administered 2016-10-28: 500 mg via INTRAMUSCULAR
  Filled 2016-10-28: qty 10

## 2016-10-28 NOTE — Patient Instructions (Signed)

## 2016-10-30 ENCOUNTER — Telehealth: Payer: Self-pay | Admitting: *Deleted

## 2016-10-30 ENCOUNTER — Other Ambulatory Visit: Payer: Self-pay | Admitting: Oncology

## 2016-10-30 ENCOUNTER — Ambulatory Visit (HOSPITAL_BASED_OUTPATIENT_CLINIC_OR_DEPARTMENT_OTHER): Payer: BLUE CROSS/BLUE SHIELD | Admitting: Oncology

## 2016-10-30 VITALS — BP 141/49 | HR 81 | Temp 98.0°F | Resp 18 | Wt 139.9 lb

## 2016-10-30 DIAGNOSIS — B029 Zoster without complications: Secondary | ICD-10-CM

## 2016-10-30 DIAGNOSIS — Z170 Estrogen receptor positive status [ER+]: Secondary | ICD-10-CM

## 2016-10-30 DIAGNOSIS — C50911 Malignant neoplasm of unspecified site of right female breast: Secondary | ICD-10-CM | POA: Diagnosis not present

## 2016-10-30 DIAGNOSIS — C50811 Malignant neoplasm of overlapping sites of right female breast: Secondary | ICD-10-CM

## 2016-10-30 DIAGNOSIS — C3411 Malignant neoplasm of upper lobe, right bronchus or lung: Secondary | ICD-10-CM

## 2016-10-30 MED ORDER — TRAMADOL HCL 50 MG PO TABS: 100.0000 mg | ORAL_TABLET | Freq: Four times a day (QID) | ORAL | 0 refills | Status: DC | PRN

## 2016-10-30 MED ORDER — VALACYCLOVIR HCL 1 G PO TABS: 1000.0000 mg | ORAL_TABLET | Freq: Three times a day (TID) | ORAL | 0 refills | Status: DC

## 2016-10-30 NOTE — Telephone Encounter (Signed)
Returned call to patient after [facility] operator reported patient called to learn "Yesterday I had sharp excruciating pain to my left leg and knee.  Last night I noticed bumps to the left lower leg.  I've heard shingles hurts and this hurts very bad.  I know I need an antiviral within 72 hours and need to be seen."    Scheduling message sent.

## 2016-10-30 NOTE — Progress Notes (Signed)
ID: Crystal [name]   DOB: [birth year]  MR#: [id number]  CSN#:[id number]  PCP: Kandice Hams, MD GYN: SU: [name] MD OTHER MD: Earle Gell, Daneen Schick, Arnoldo Hooker Thimmappa]  CHIEF COMPLAINT: recurrent estrogen receptor positive breast cancer in previously irradiated breast; pT1a lung cancer  CURRENT TREATMENT: Fulvestrant  BREAST CANCER HISTORY:   From the original intake note:  The patient had bilateral diagnostic mammography 02/12/2011.  She has a history of cystic changes noted on prior studies in April 2011 and April 2010.  On the Feb 12, 2011 study there were new calcifications identified superiorly in the right breast, without any obvious mass.  The patient was brought back for further studies, 02/19/2011 and Dr. Marcelo Baldy was able to localize the calcifications under stereotactic guidance.  The biopsy ([id number]) showed high-grade ductal carcinoma with papillary features which was 70% estrogen receptor positive, 0% progesterone receptor positive.  There was no definitive invasion and therefore further studies were not performed.   Bilateral breast MRIs were obtained Feb 26, 2011.  This showed marked nodular enhancement throughout both breasts, and in the upper central portion there was a post biopsy area of change measuring up to 3.7 cm.  Most of this is a fluid cavity with a thin rim of enhancement.  There were no suspicious areas in the left breast.  No internal mammary or axillary lymph nodes.  Of course, the patient has a history of hepatic adenomas and these were noted incidentally on the MRI.   Her definitive surgical treatment and subsequent history are as detailed below.  INTERVAL HISTORY: [name] returns today for an unscheduled visit. She developed significant pain and burning in her left leg and left knee, including some pain in her left hip. She thought she might have twisted something but then when she looked down in the leg she noticed some vesicles and she came here  concerned that she might be developing shingles  REVIEW OF SYSTEMS: A detailed review of systems today is otherwise stable.  PAST MEDICAL HISTORY: Past Medical History:  Diagnosis Date  . Anxiety   . Cancer ([place])    right breast  . Complication of anesthesia    hard to wake up  . GERD (gastroesophageal reflux disease)   . History of small bowel obstruction   . Insomnia   . Liver masses   history of small-bowel obstruction with partial colectomy March 2001.  The patient has a history of hepatic adenomas.  She has had liver resections x2.  The data for this is at [facility] and we will try to obtain it.  She has also had incisional hernia repair.  She has a history of osteopenia, and she has a history of multiple anti-red cell antibodies making a transfusion difficult.  The patient tells me that she has a history of low calcium and a tendency to get liver injections.  PAST SURGICAL HISTORY: Past Surgical History:  Procedure Laterality Date  . BREAST SURGERY Right   . HERNIA REPAIR     incisional hernia repair x3  . laparotomies    . LIVER SURGERY    . LOBECTOMY Right 11/29/2015   Procedure: Completion Right Upper  LOBECTOMY, Lymph node dissection, on Q placement;  Surgeon: Grace Isaac, MD;  Location: [place];  Service: Thoracic;  Laterality: Right;  . MASTECTOMY W/ SENTINEL NODE BIOPSY Right 07/05/2015   Procedure: RIGHT MASTECTOMY WITH SENTINEL NODE BIOPSY AND PORT A CATH INSERTION;  Surgeon: [name], MD;  Location: [place];  Service: General;  Laterality: Right;  . PORTACATH PLACEMENT Left 07/05/2015   Procedure: INSERTION PORT-A-CATH;  Surgeon: [name], MD;  Location: [place];  Service: General;  Laterality: Left;  Marland Kitchen VIDEO ASSISTED THORACOSCOPY (VATS)/WEDGE RESECTION Right 11/29/2015   Procedure: BRONCHOSCOPY , VIDEO ASSISTED THORACOSCOPY (VATS)/WEDGE RESECTION;  Surgeon: Grace Isaac, MD;  Location: [facility];  Service: Thoracic;   Laterality: Right;    FAMILY HISTORY Family History  Problem Relation Age of Onset  . Ovarian cancer Mother 27    "metastasized to breasts"  . Breast cancer Maternal Grandmother 48    2-3 recurrences  . Stomach cancer Maternal Grandfather     dx. 47-49; metastatic stomach cancer  . Colon polyps Father     about 2-3 polyps removed at each colonoscopy  . Colon polyps Brother     "a few"  . Other Maternal Aunt     +TAH  . Colon cancer Paternal Aunt 90  . Brain cancer Daughter 17    astrocytoma  . Breast cancer Cousin     dx. 7s  The patient's mother died from ovarian cancer at the age of 16.  The patient's father is alive at age 36.  She has 2 brothers, no sisters.  A maternal grandmother was diagnosed with breast cancer at the age of 30, and the maternal grandfather had stomach cancer at the age of 71.  GYNECOLOGIC HISTORY: The patient had menarche at age 55.  Her [name] menstrual period was in 2005.  She never used hormone replacement.  She used birth control briefly to control bleeding prior to 1980.  She was 61 years old at the birth of her first child.  She started having mammograms at age 21 because of a strong family history.  SOCIAL HISTORY: Ranette works as a Programmer, applications for [employer].   She also heads the local "[employer]" Her husband, Peja Allender, is a retired Engineer, maintenance from a [employer] locally.  Son, [name], lives in [place] and works for that [employer]  Daughter, [name], lives in [place] and is a homemaker.   ADVANCED DIRECTIVES: in place  HEALTH MAINTENANCE: Social History  Substance Use Topics  . Smoking status: Never Smoker  . Smokeless tobacco: Never Used  . Alcohol use 0.6 oz/week    1 Glasses of wine per week     Comment: social     Colonoscopy:  PAP:  Bone density:  Lipid panel:  Allergies  Allergen Reactions  . Promethazine Hcl Other (See Comments)    Causes shaking  . Tylenol  [Acetaminophen] Other (See Comments)    Avoids Tylenol products due to liver disease   . Penicillins Rash    Current Outpatient Prescriptions  Medication Sig Dispense Refill  . b complex vitamins tablet Take 1 tablet by mouth daily.      . calcium-vitamin D (OSCAL WITH D) 500-200 MG-UNIT per tablet Take 2 tablets by mouth daily.    . famotidine (PEPCID AC) 10 MG chewable tablet Chew 10 mg by mouth daily as needed for heartburn.    . fulvestrant (FASLODEX) 250 MG/5ML injection Inject 500 mg into the muscle every 30 (thirty) days. One injection each buttock over 1-2 minutes. Warm prior to use.    Marland Kitchen glucosamine-chondroitin 500-400 MG tablet Take 1 tablet by mouth 3 (three) times daily.      Marland Kitchen ibuprofen (ADVIL,MOTRIN) 200 MG tablet Take 200 mg by mouth every 6 (six) hours  as needed for fever or mild pain.     . Multiple Vitamins-Minerals (MULTIVITAMIN WITH MINERALS) tablet Take 1 tablet by mouth daily.      . Omega-3 Fatty Acids (FISH OIL) 500 MG CAPS Take 1,000 mg by mouth daily.    . traMADol (ULTRAM) 50 MG tablet Take 2 tablets (100 mg total) by mouth every 6 (six) hours as needed. 60 tablet 0  . Turmeric 500 MG TABS Take 1,000 mg by mouth daily.    . valACYclovir (VALTREX) 1000 MG tablet Take 1 tablet (1,000 mg total) by mouth 3 (three) times daily. 30 tablet 0   No current facility-administered medications for this visit.     OBJECTIVE: Middle-aged white woman In no acute distress Vitals:   10/30/16 1022  BP: (!) 141/49  Pulse: 81  Resp: 18  Temp: 98 F (36.7 C)     Body mass index is 22.58 kg/m.    ECOG FS: 1  Sclerae unicteric, pupils round and equal Oropharynx clear and moist-- no thrush or other lesions No cervical or supraclavicular adenopathy Lungs no rales or rhonchi Heart regular rate and rhythm Abd soft, nontender, positive bowel sounds MSK no focal spinal tenderness, no upper extremity lymphedema Neuro: nonfocal, well oriented, appropriate affect Breasts:  Deferred Skin: There are vesicles in the lower leg and also a little bit above the knee consistent with shingles.  LAB RESULTS: Lab Results  Component Value Date   WBC 7.1 10/28/2016   NEUTROABS 4.8 10/28/2016   HGB 13.3 10/28/2016   HCT 41.1 10/28/2016   MCV 88.2 10/28/2016   PLT 209 10/28/2016      Chemistry      Component Value Date/Time   NA 139 10/28/2016 0914   K 4.6 10/28/2016 0914   CL 105 12/03/2015 0430   CL 102 03/15/2013 0912   CO2 25 10/28/2016 0914   BUN 15.6 10/28/2016 0914   CREATININE 0.8 10/28/2016 0914      Component Value Date/Time   CALCIUM 9.6 10/28/2016 0914   ALKPHOS 129 10/28/2016 0914   AST 22 10/28/2016 0914   ALT 30 10/28/2016 0914   BILITOT 0.36 10/28/2016 0914       Lab Results  Component Value Date   LABCA2 28 10/20/2012    No components found for: [id number]  No results for input(s): INR in the [name] 168 hours.  Urinalysis    Component Value Date/Time   COLORURINE YELLOW 11/27/2015 1403    STUDIES: No results found.  ASSESSMENT: 61 y.o.  BRCA 1-2 and p-53 negative [place] woman status post right lumpectomy and sentinel lymph node sampling June 2012 for a T1a N0, Stage IA  invasive ductal carcinoma, grade 3, estrogen receptor 98% positive, progesterone receptor 97% positive, with an MIB-1 of 5% and no HER-2 amplification   (a) note that the initial Right breast biopsy showed DCIS w papillary architecture, estrogen receptor 70% positive, progesterone receptor negative  (1) completed radiation September 2012.   (2) on letrozole starting July 2012, discontinued August 2016 with recurrence  RECURRENT DISEASE: (3) right breast upper outer quadrant biopsy 05/23/2015 was positive for a clinical T1c N0, stage IA invasive ductal carcinoma, grade 2, estrogen receptor 60% positive with moderate staining intensity, progesterone receptor negative, with no HER-2 amplification and an MIB-1 of 90%.  (4) status post right simple mastectomy  07/05/2015 for a pT1c pN0, stage IA invasive ductal carcinoma, grade 3, with negative margins. 4 lymph nodes removed with mastectomy were benign.  (a) the  patient is not planning on reconstruction  (5) began cyclophosphamide and docetaxel 07/26/2015, repeated every 21 days 4, final dose 09/26/2015  (6) fulvestrant started 10/23/2015   (7) genetics testing 07/20/2015 was normal and did not reveal a deleterious mutation in ATM, BARD1, BRCA1, BRCA2, BRIP1, CDH1, CHEK2, FANCC, MLH1, MSH2, MSH6, NBN, PALB2, PMS2, PTEN, RAD51C, RAD51D, TP53, and XRCC2. Additionally no variant of uncertain significance were found.   LUNG CANCER: (8) RUL spiculated nodule measuring 1.4 cm noted on staging CT scan 06/13/2015-- stable on repeat CT 07/25/15--moderately hot and slightly larger on PET 10/31/2015  (a) status post right upper lobectomy and lymph node dissection 11/29/2015 for a pT1a pN0, stage IA adenocarcinoma, low-grade, with negative margins   (9) history of multiple hepatic adenomas    PLAN: [name] has developed shingles in her left leg. She is within 48 hours of diagnosis. I am going to start her on Valtrex 1000 mg 3 times a day for the next 7 days. I wrote her tramadol or her pain. She has a good understanding of the possible toxicities, side effects and complications of these agents.  Incidentally her husband has developed a medical issue that he would like to discuss with me. I gave him my phone number; call at his discretion. MAGRINAT,GUSTAV C    10/30/2016

## 2016-10-31 ENCOUNTER — Telehealth: Payer: Self-pay | Admitting: *Deleted

## 2016-10-31 MED ORDER — PROCHLORPERAZINE MALEATE 10 MG PO TABS: 10.0000 mg | ORAL_TABLET | Freq: Four times a day (QID) | ORAL | 0 refills | Status: DC | PRN

## 2016-10-31 NOTE — Telephone Encounter (Signed)
This RN spoke with pt per call stating onset of nausea with valtrex- with 2 epid

## 2016-11-25 ENCOUNTER — Other Ambulatory Visit (HOSPITAL_BASED_OUTPATIENT_CLINIC_OR_DEPARTMENT_OTHER): Payer: BLUE CROSS/BLUE SHIELD

## 2016-11-25 ENCOUNTER — Ambulatory Visit (HOSPITAL_BASED_OUTPATIENT_CLINIC_OR_DEPARTMENT_OTHER): Payer: BLUE CROSS/BLUE SHIELD

## 2016-11-25 ENCOUNTER — Ambulatory Visit (HOSPITAL_BASED_OUTPATIENT_CLINIC_OR_DEPARTMENT_OTHER): Payer: BLUE CROSS/BLUE SHIELD | Admitting: Oncology

## 2016-11-25 ENCOUNTER — Ambulatory Visit (HOSPITAL_COMMUNITY)
Admission: RE | Admit: 2016-11-25 | Discharge: 2016-11-25 | Disposition: A | Discharge status: Home / Self Care | Payer: BLUE CROSS/BLUE SHIELD | Source: Ambulatory Visit | Attending: Oncology | Admitting: Oncology

## 2016-11-25 VITALS — BP 119/63 | HR 86 | Temp 98.0°F | Resp 17 | Ht 66.0 in | Wt 135.7 lb

## 2016-11-25 DIAGNOSIS — C3411 Malignant neoplasm of upper lobe, right bronchus or lung: Secondary | ICD-10-CM

## 2016-11-25 DIAGNOSIS — Z170 Estrogen receptor positive status [ER+]: Secondary | ICD-10-CM

## 2016-11-25 DIAGNOSIS — C50911 Malignant neoplasm of unspecified site of right female breast: Secondary | ICD-10-CM | POA: Diagnosis not present

## 2016-11-25 DIAGNOSIS — C50811 Malignant neoplasm of overlapping sites of right female breast: Secondary | ICD-10-CM

## 2016-11-25 DIAGNOSIS — Z5111 Encounter for antineoplastic chemotherapy: Secondary | ICD-10-CM

## 2016-11-25 DIAGNOSIS — C50411 Malignant neoplasm of upper-outer quadrant of right female breast: Secondary | ICD-10-CM

## 2016-11-25 LAB — CBC WITH DIFFERENTIAL/PLATELET
BASO%: 0.4 % (ref 0.0–2.0)
BASOS ABS: 0 10*3/uL (ref 0.0–0.1)
EOS ABS: 0.1 10*3/uL (ref 0.0–0.5)
EOS%: 1.2 % (ref 0.0–7.0)
HCT: 42.8 % (ref 34.8–46.6)
HGB: 14 g/dL (ref 11.6–15.9)
LYMPH%: 26.7 % (ref 14.0–49.7)
MCH: 28.9 pg (ref 25.1–34.0)
MCHC: 32.7 g/dL (ref 31.5–36.0)
MCV: 88.4 fL (ref 79.5–101.0)
MONO#: 0.4 10*3/uL (ref 0.1–0.9)
MONO%: 5.3 % (ref 0.0–14.0)
NEUT#: 4.9 10*3/uL (ref 1.5–6.5)
NEUT%: 66.4 % (ref 38.4–76.8)
PLATELETS: 247 10*3/uL (ref 145–400)
RBC: 4.84 10*6/uL (ref 3.70–5.45)
RDW: 13.9 % (ref 11.2–14.5)
WBC: 7.4 10*3/uL (ref 3.9–10.3)
lymph#: 2 10*3/uL (ref 0.9–3.3)

## 2016-11-25 LAB — COMPREHENSIVE METABOLIC PANEL
ALBUMIN: 4.3 g/dL (ref 3.5–5.0)
ALK PHOS: 130 U/L (ref 40–150)
ALT: 34 U/L (ref 0–55)
ANION GAP: 9 meq/L (ref 3–11)
AST: 25 U/L (ref 5–34)
BILIRUBIN TOTAL: 0.43 mg/dL (ref 0.20–1.20)
BUN: 13.9 mg/dL (ref 7.0–26.0)
CALCIUM: 9.9 mg/dL (ref 8.4–10.4)
CO2: 28 mEq/L (ref 22–29)
Chloride: 102 mEq/L (ref 98–109)
Creatinine: 0.8 mg/dL (ref 0.6–1.1)
EGFR: 78 mL/min/{1.73_m2} — AB (ref >90)
Glucose: 108 mg/dl (ref 70–140)
POTASSIUM: 4.5 meq/L (ref 3.5–5.1)
SODIUM: 139 meq/L (ref 136–145)
TOTAL PROTEIN: 7.5 g/dL (ref 6.4–8.3)

## 2016-11-25 MED ORDER — FULVESTRANT 250 MG/5ML IM SOLN
500.0000 mg | Freq: Once | INTRAMUSCULAR | Status: AC
  Administered 2016-11-25: 500 mg via INTRAMUSCULAR
  Filled 2016-11-25: qty 10

## 2016-11-25 NOTE — Patient Instructions (Signed)

## 2016-11-25 NOTE — Progress Notes (Signed)
ID: Crystal [name]   DOB: Jun 14, 1956  MR#: [id number]  HQI#:[id number]  PCP: Kandice Hams, MD GYN: SU: [name] MD OTHER MD: Earle Gell, Daneen Schick, Arnoldo Hooker Thimmappa]  CHIEF COMPLAINT: recurrent estrogen receptor positive breast cancer in previously irradiated breast; pT1a lung cancer  CURRENT TREATMENT: Fulvestrant  BREAST CANCER HISTORY:   From the original intake note:  The patient had bilateral diagnostic mammography 02/12/2011.  She has a history of cystic changes noted on prior studies in April 2011 and April 2010.  On the Feb 12, 2011 study there were new calcifications identified superiorly in the right breast, without any obvious mass.  The patient was brought back for further studies, 02/19/2011 and Dr. Marcelo Baldy was able to localize the calcifications under stereotactic guidance.  The biopsy ([id number]) showed high-grade ductal carcinoma with papillary features which was 70% estrogen receptor positive, 0% progesterone receptor positive.  There was no definitive invasion and therefore further studies were not performed.   Bilateral breast MRIs were obtained Feb 26, 2011.  This showed marked nodular enhancement throughout both breasts, and in the upper central portion there was a post biopsy area of change measuring up to 3.7 cm.  Most of this is a fluid cavity with a thin rim of enhancement.  There were no suspicious areas in the left breast.  No internal mammary or axillary lymph nodes.  Of course, the patient has a history of hepatic adenomas and these were noted incidentally on the MRI.   Her definitive surgical treatment and subsequent history are as detailed below.  INTERVAL HISTORY: Crystal [name] returns today for follow-up of her estrogen receptor positive recurrent breast cancer and history of lung adenocarcinoma.  Since her [name] visit here the shingles in her left leg has greatly improved. The rash has faded. The pain moved up from her knees through her thigh to her  left hip but is now pretty much gone.  She is tolerating the fulvestrant treatment without major concerns. She does have a "knot" in her right buttock as a result of the [name] treatment.   REVIEW OF SYSTEMS: She has felt a little "heaviness" and is not exercising as regularly as she used to. She thinks the fact that her husband has the health problems we discussed and the fact that her son is moving to [place] may be related to this. Aside from that a detailed review of systems today was entirely benign  PAST MEDICAL HISTORY: Past Medical History:  Diagnosis Date  . Anxiety   . Cancer ([place])    right breast  . Complication of anesthesia    hard to wake up  . GERD (gastroesophageal reflux disease)   . History of small bowel obstruction   . Insomnia   . Liver masses   history of small-bowel obstruction with partial colectomy March 2001.  The patient has a history of hepatic adenomas.  She has had liver resections x2.  The data for this is at [facility] and we will try to obtain it.  She has also had incisional hernia repair.  She has a history of osteopenia, and she has a history of multiple anti-red cell antibodies making a transfusion difficult.  The patient tells me that she has a history of low calcium and a tendency to get liver injections.  PAST SURGICAL HISTORY: Past Surgical History:  Procedure Laterality Date  . BREAST SURGERY Right   . HERNIA REPAIR     incisional hernia repair x3  . laparotomies    .  LIVER SURGERY    . LOBECTOMY Right 11/29/2015   Procedure: Completion Right Upper  LOBECTOMY, Lymph node dissection, on Q placement;  Surgeon: Grace Isaac, MD;  Location: [place];  Service: Thoracic;  Laterality: Right;  . MASTECTOMY W/ SENTINEL NODE BIOPSY Right 07/05/2015   Procedure: RIGHT MASTECTOMY WITH SENTINEL NODE BIOPSY AND PORT A CATH INSERTION;  Surgeon: [name], MD;  Location: [place];  Service: General;  Laterality: Right;  . PORTACATH  PLACEMENT Left 07/05/2015   Procedure: INSERTION PORT-A-CATH;  Surgeon: [name], MD;  Location: [place];  Service: General;  Laterality: Left;  Marland Kitchen VIDEO ASSISTED THORACOSCOPY (VATS)/WEDGE RESECTION Right 11/29/2015   Procedure: BRONCHOSCOPY , VIDEO ASSISTED THORACOSCOPY (VATS)/WEDGE RESECTION;  Surgeon: Grace Isaac, MD;  [facility];  Service: Thoracic;  Laterality: Right;    FAMILY HISTORY Family History  Problem Relation Age of Onset  . Ovarian cancer Mother 16    "metastasized to breasts"  . Breast cancer Maternal Grandmother 48    2-3 recurrences  . Stomach cancer Maternal Grandfather     dx. 47-49; metastatic stomach cancer  . Colon polyps Father     about 2-3 polyps removed at each colonoscopy  . Colon polyps Brother     "a few"  . Other Maternal Aunt     +TAH  . Colon cancer Paternal Aunt 90  . Brain cancer Daughter 17    astrocytoma  . Breast cancer Cousin     dx. 15s  The patient's mother died from ovarian cancer at the age of 32.  The patient's father is alive at age 27.  She has 2 brothers, no sisters.  A maternal grandmother was diagnosed with breast cancer at the age of 76, and the maternal grandfather had stomach cancer at the age of 32.  GYNECOLOGIC HISTORY: The patient had menarche at age 54.  Her [name] menstrual period was in 2005.  She never used hormone replacement.  She used birth control briefly to control bleeding prior to 1980.  She was 61 years old at the birth of her first child.  She started having mammograms at age 68 because of a strong family history.  SOCIAL HISTORY: Crystal [name] works as a Programmer, applications for [employer].   She also heads the local "[employer]" Her husband, Tahani Potier, is a retired Engineer, maintenance from a [employer] locally.  Son, [name], lives in [place] and works for that [employer]  Daughter, [name], lives in [place] and is a homemaker.   ADVANCED  DIRECTIVES: in place  HEALTH MAINTENANCE: Social History  Substance Use Topics  . Smoking status: Never Smoker  . Smokeless tobacco: Never Used  . Alcohol use 0.6 oz/week    1 Glasses of wine per week     Comment: social     Colonoscopy:  PAP:  Bone density:  Lipid panel:  Allergies  Allergen Reactions  . Promethazine Hcl Other (See Comments)    Causes shaking  . Tylenol [Acetaminophen] Other (See Comments)    Avoids Tylenol products due to liver disease   . Penicillins Rash    Current Outpatient Prescriptions  Medication Sig Dispense Refill  . b complex vitamins tablet Take 1 tablet by mouth daily.      . calcium-vitamin D (OSCAL WITH D) 500-200 MG-UNIT per tablet Take 2 tablets by mouth daily.    . famotidine (PEPCID AC) 10 MG chewable tablet Chew 10 mg  by mouth daily as needed for heartburn.    . fulvestrant (FASLODEX) 250 MG/5ML injection Inject 500 mg into the muscle every 30 (thirty) days. One injection each buttock over 1-2 minutes. Warm prior to use.    Marland Kitchen glucosamine-chondroitin 500-400 MG tablet Take 1 tablet by mouth 3 (three) times daily.      Marland Kitchen ibuprofen (ADVIL,MOTRIN) 200 MG tablet Take 200 mg by mouth every 6 (six) hours as needed for fever or mild pain.     . Multiple Vitamins-Minerals (MULTIVITAMIN WITH MINERALS) tablet Take 1 tablet by mouth daily.      . Omega-3 Fatty Acids (FISH OIL) 500 MG CAPS Take 1,000 mg by mouth daily.    . prochlorperazine (COMPAZINE) 10 MG tablet Take 1 tablet (10 mg total) by mouth every 6 (six) hours as needed for nausea or vomiting. 30 tablet 0  . traMADol (ULTRAM) 50 MG tablet Take 2 tablets (100 mg total) by mouth every 6 (six) hours as needed. 60 tablet 0  . Turmeric 500 MG TABS Take 1,000 mg by mouth daily.    . valACYclovir (VALTREX) 1000 MG tablet Take 1 tablet (1,000 mg total) by mouth 3 (three) times daily. 30 tablet 0   No current facility-administered medications for this visit.    Facility-Administered Medications  Ordered in Other Visits  Medication Dose Route Frequency Provider [name] Rate [name] Dose  . fulvestrant (FASLODEX) injection 500 mg  500 mg Intramuscular Once Chauncey Cruel, MD        OBJECTIVE: Middle-aged white woman who appears well Vitals:   11/25/16 0845  BP: 119/63  Pulse: 86  Resp: 17  Temp: 98 F (36.7 C)     Body mass index is 21.9 kg/m.    ECOG FS: 1  Sclerae unicteric, EOMs intact Oropharynx clear and moist No cervical or supraclavicular adenopathy Lungs no rales or rhonchi Heart regular rate and rhythm Abd soft, nontender, positive bowel sounds MSK no focal spinal tenderness, no upper extremity lymphedema Neuro: nonfocal, well oriented, appropriate affect Breasts: The right breast is status post mastectomy with no evidence of local recurrence. The right axilla is benign. Left breast is unremarkable.   LAB RESULTS: Lab Results  Component Value Date   WBC 7.4 11/25/2016   NEUTROABS 4.9 11/25/2016   HGB 14.0 11/25/2016   HCT 42.8 11/25/2016   MCV 88.4 11/25/2016   PLT 247 11/25/2016      Chemistry      Component Value Date/Time   NA 139 11/25/2016 0813   K 4.5 11/25/2016 0813   CL 105 12/03/2015 0430   CL 102 03/15/2013 0912   CO2 28 11/25/2016 0813   BUN 13.9 11/25/2016 0813   CREATININE 0.8 11/25/2016 0813      Component Value Date/Time   CALCIUM 9.9 11/25/2016 0813   ALKPHOS 130 11/25/2016 0813   AST 25 11/25/2016 0813   ALT 34 11/25/2016 0813   BILITOT 0.43 11/25/2016 0813       Lab Results  Component Value Date   LABCA2 28 10/20/2012    No components found for: [id number]  No results for input(s): INR in the [name] 168 hours.  Urinalysis    Component Value Date/Time   COLORURINE YELLOW 11/27/2015 1403    STUDIES: Dg Chest 2 View  Result Date: 11/25/2016 CLINICAL DATA:  Follow-up malignancy of the right upper lobe. History of breast malignancy. No current chest complaints. Previous right lobectomy and right mastectomy. EXAM: CHEST   2 VIEW COMPARISON:  Chest  x-ray of September 02, 2016 FINDINGS: The lungs are well-expanded. There is minimal volume loss on the right with stable basilar scarring. There are numerous surgical clips centered at the right thoracoabdominal junction. The heart and pulmonary vascularity are normal. The mediastinum is normal in width. There is no pleural effusion. The trachea is midline. The bony thorax exhibits no acute abnormality. IMPRESSION: There is no acute cardiopulmonary abnormality. There are no findings suspicious for primary or metastatic malignancy. Electronically Signed   By: David  Martinique M.D.   On: 11/25/2016 07:51    ASSESSMENT: 61 y.o.  BRCA 1-2 and p-53 negative [place] woman status post right lumpectomy and sentinel lymph node sampling June 2012 for a T1a N0, Stage IA  invasive ductal carcinoma, grade 3, estrogen receptor 98% positive, progesterone receptor 97% positive, with an MIB-1 of 5% and no HER-2 amplification   (a) note that the initial Right breast biopsy showed DCIS w papillary architecture, estrogen receptor 70% positive, progesterone receptor negative  (1) completed radiation September 2012.   (2) on letrozole starting July 2012, discontinued August 2016 with recurrence  RECURRENT DISEASE: (3) right breast upper outer quadrant biopsy 05/23/2015 was positive for a clinical T1c N0, stage IA invasive ductal carcinoma, grade 2, estrogen receptor 60% positive with moderate staining intensity, progesterone receptor negative, with no HER-2 amplification and an MIB-1 of 90%.  (4) status post right simple mastectomy 07/05/2015 for a pT1c pN0, stage IA invasive ductal carcinoma, grade 3, with negative margins. 4 lymph nodes removed with mastectomy were benign.  (a) the patient is not planning on reconstruction  (5) began cyclophosphamide and docetaxel 07/26/2015, repeated every 21 days 4, final dose 09/26/2015  (6) fulvestrant started 10/23/2015  (7) genetics testing  07/20/2015 was normal and did not reveal a deleterious mutation in ATM, BARD1, BRCA1, BRCA2, BRIP1, CDH1, CHEK2, FANCC, MLH1, MSH2, MSH6, NBN, PALB2, PMS2, PTEN, RAD51C, RAD51D, TP53, and XRCC2. Additionally no variant of uncertain significance were found.   LUNG CANCER: (8) RUL spiculated nodule measuring 1.4 cm noted on staging CT scan 06/13/2015-- stable on repeat CT 07/25/15--moderately hot and slightly larger on PET 10/31/2015  (a) status post right upper lobectomy and lymph node dissection 11/29/2015 for a pT1a pN0, stage IA primary lung adenocarcinoma (estrogen, progesterone, and gross cystic disease fluid protein negative, strongly Napsin-A and TTF-1 positive), low-grade, with negative margins   (9) history of multiple hepatic adenomas  PLAN:  I spent approximately 25 minutes with Crystal [name] Today reviewing her health issues. She is now a year and a half out from diagnosis of her recurrent breast cancer, with no evidence of disease activity. This is very favorable.  She is tolerating the fulvestrant well. In cases of metastatic disease of course we continue this indefinitely. In her case we will continue with at least another year and then reassess.  In the absence of specific symptoms or lab changes I will do a chest x-ray of the next visit here which will be in September and then a CT scan of the chest in December. That will look at most of her bones, her lungs, and of course her liver where she is known to have multiple adenomatous  As far as her mild depressive symptoms I suggested exercise. She will use naproxen as needed to get herself going if she is feeling a little "heavy".  Otherwise we discussed some of her husband's health issues today. She will keep me up-to-date if I can be of any help   MAGRINAT,GUSTAV C  11/25/2016

## 2016-11-27 ENCOUNTER — Encounter (HOSPITAL_COMMUNITY): Payer: Self-pay

## 2016-12-23 ENCOUNTER — Ambulatory Visit (HOSPITAL_BASED_OUTPATIENT_CLINIC_OR_DEPARTMENT_OTHER): Payer: BLUE CROSS/BLUE SHIELD

## 2016-12-23 ENCOUNTER — Other Ambulatory Visit (HOSPITAL_BASED_OUTPATIENT_CLINIC_OR_DEPARTMENT_OTHER): Payer: BLUE CROSS/BLUE SHIELD

## 2016-12-23 VITALS — BP 127/63 | HR 69 | Temp 98.4°F | Resp 18

## 2016-12-23 DIAGNOSIS — Z5111 Encounter for antineoplastic chemotherapy: Secondary | ICD-10-CM

## 2016-12-23 DIAGNOSIS — C50811 Malignant neoplasm of overlapping sites of right female breast: Secondary | ICD-10-CM

## 2016-12-23 DIAGNOSIS — C50411 Malignant neoplasm of upper-outer quadrant of right female breast: Secondary | ICD-10-CM | POA: Diagnosis not present

## 2016-12-23 DIAGNOSIS — C50911 Malignant neoplasm of unspecified site of right female breast: Secondary | ICD-10-CM

## 2016-12-23 LAB — CBC WITH DIFFERENTIAL/PLATELET
BASO%: 0.5 % (ref 0.0–2.0)
Basophils Absolute: 0 10*3/uL (ref 0.0–0.1)
EOS ABS: 0.1 10*3/uL (ref 0.0–0.5)
EOS%: 2 % (ref 0.0–7.0)
HCT: 41.1 % (ref 34.8–46.6)
HGB: 13.4 g/dL (ref 11.6–15.9)
LYMPH%: 30.8 % (ref 14.0–49.7)
MCH: 29.3 pg (ref 25.1–34.0)
MCHC: 32.6 g/dL (ref 31.5–36.0)
MCV: 89.9 fL (ref 79.5–101.0)
MONO#: 0.3 10*3/uL (ref 0.1–0.9)
MONO%: 4.8 % (ref 0.0–14.0)
NEUT%: 61.9 % (ref 38.4–76.8)
NEUTROS ABS: 3.5 10*3/uL (ref 1.5–6.5)
Platelets: 218 10*3/uL (ref 145–400)
RBC: 4.57 10*6/uL (ref 3.70–5.45)
RDW: 13.9 % (ref 11.2–14.5)
WBC: 5.6 10*3/uL (ref 3.9–10.3)
lymph#: 1.7 10*3/uL (ref 0.9–3.3)

## 2016-12-23 LAB — COMPREHENSIVE METABOLIC PANEL
ALT: 33 U/L (ref 0–55)
AST: 25 U/L (ref 5–34)
Albumin: 4.3 g/dL (ref 3.5–5.0)
Alkaline Phosphatase: 134 U/L (ref 40–150)
Anion Gap: 10 mEq/L (ref 3–11)
BILIRUBIN TOTAL: 0.44 mg/dL (ref 0.20–1.20)
BUN: 11.3 mg/dL (ref 7.0–26.0)
CHLORIDE: 103 meq/L (ref 98–109)
CO2: 27 meq/L (ref 22–29)
Calcium: 9.9 mg/dL (ref 8.4–10.4)
Creatinine: 0.8 mg/dL (ref 0.6–1.1)
EGFR: 77 mL/min/{1.73_m2} — AB (ref >90)
GLUCOSE: 88 mg/dL (ref 70–140)
POTASSIUM: 3.9 meq/L (ref 3.5–5.1)
SODIUM: 140 meq/L (ref 136–145)
TOTAL PROTEIN: 7.6 g/dL (ref 6.4–8.3)

## 2016-12-23 MED ORDER — FULVESTRANT 250 MG/5ML IM SOLN
500.0000 mg | Freq: Once | INTRAMUSCULAR | Status: AC
  Administered 2016-12-23: 500 mg via INTRAMUSCULAR
  Filled 2016-12-23: qty 10

## 2016-12-23 NOTE — Patient Instructions (Signed)

## 2016-12-25 ENCOUNTER — Other Ambulatory Visit: Payer: Self-pay | Admitting: Cardiothoracic Surgery

## 2016-12-25 DIAGNOSIS — C3490 Malignant neoplasm of unspecified part of unspecified bronchus or lung: Secondary | ICD-10-CM

## 2017-01-02 ENCOUNTER — Other Ambulatory Visit: Payer: BLUE CROSS/BLUE SHIELD

## 2017-01-02 ENCOUNTER — Ambulatory Visit: Payer: BLUE CROSS/BLUE SHIELD | Admitting: Cardiothoracic Surgery

## 2017-01-06 ENCOUNTER — Ambulatory Visit (HOSPITAL_BASED_OUTPATIENT_CLINIC_OR_DEPARTMENT_OTHER): Payer: BLUE CROSS/BLUE SHIELD

## 2017-01-06 ENCOUNTER — Other Ambulatory Visit: Payer: Self-pay | Admitting: *Deleted

## 2017-01-06 ENCOUNTER — Ambulatory Visit (HOSPITAL_BASED_OUTPATIENT_CLINIC_OR_DEPARTMENT_OTHER): Payer: BLUE CROSS/BLUE SHIELD | Admitting: Nurse Practitioner

## 2017-01-06 ENCOUNTER — Encounter: Payer: Self-pay | Admitting: Nurse Practitioner

## 2017-01-06 VITALS — BP 159/58 | HR 92 | Temp 98.6°F | Resp 18 | Wt 137.7 lb

## 2017-01-06 DIAGNOSIS — C3490 Malignant neoplasm of unspecified part of unspecified bronchus or lung: Secondary | ICD-10-CM | POA: Diagnosis not present

## 2017-01-06 DIAGNOSIS — J40 Bronchitis, not specified as acute or chronic: Secondary | ICD-10-CM

## 2017-01-06 DIAGNOSIS — R05 Cough: Secondary | ICD-10-CM | POA: Diagnosis not present

## 2017-01-06 DIAGNOSIS — C50911 Malignant neoplasm of unspecified site of right female breast: Secondary | ICD-10-CM

## 2017-01-06 DIAGNOSIS — R059 Cough, unspecified: Secondary | ICD-10-CM

## 2017-01-06 DIAGNOSIS — R062 Wheezing: Secondary | ICD-10-CM | POA: Diagnosis not present

## 2017-01-06 DIAGNOSIS — R0981 Nasal congestion: Secondary | ICD-10-CM

## 2017-01-06 LAB — CBC WITH DIFFERENTIAL/PLATELET
BASO%: 0.5 % (ref 0.0–2.0)
BASOS ABS: 0 10*3/uL (ref 0.0–0.1)
EOS ABS: 0.2 10*3/uL (ref 0.0–0.5)
EOS%: 1.8 % (ref 0.0–7.0)
HCT: 37.1 % (ref 34.8–46.6)
HGB: 12.3 g/dL (ref 11.6–15.9)
LYMPH%: 13.5 % — AB (ref 14.0–49.7)
MCH: 29.7 pg (ref 25.1–34.0)
MCHC: 33.1 g/dL (ref 31.5–36.0)
MCV: 89.9 fL (ref 79.5–101.0)
MONO#: 0.6 10*3/uL (ref 0.1–0.9)
MONO%: 6.5 % (ref 0.0–14.0)
NEUT#: 6.8 10*3/uL — ABNORMAL HIGH (ref 1.5–6.5)
NEUT%: 77.7 % — AB (ref 38.4–76.8)
PLATELETS: 188 10*3/uL (ref 145–400)
RBC: 4.13 10*6/uL (ref 3.70–5.45)
RDW: 13.9 % (ref 11.2–14.5)
WBC: 8.8 10*3/uL (ref 3.9–10.3)
lymph#: 1.2 10*3/uL (ref 0.9–3.3)

## 2017-01-06 LAB — COMPREHENSIVE METABOLIC PANEL
ALT: 28 U/L (ref 0–55)
ANION GAP: 9 meq/L (ref 3–11)
AST: 19 U/L (ref 5–34)
Albumin: 3.8 g/dL (ref 3.5–5.0)
Alkaline Phosphatase: 133 U/L (ref 40–150)
BILIRUBIN TOTAL: 0.48 mg/dL (ref 0.20–1.20)
BUN: 9.6 mg/dL (ref 7.0–26.0)
CALCIUM: 9.6 mg/dL (ref 8.4–10.4)
CHLORIDE: 105 meq/L (ref 98–109)
CO2: 27 meq/L (ref 22–29)
Creatinine: 0.8 mg/dL (ref 0.6–1.1)
EGFR: 81 mL/min/{1.73_m2} — AB (ref >90)
Glucose: 140 mg/dl (ref 70–140)
Potassium: 3.8 mEq/L (ref 3.5–5.1)
Sodium: 141 mEq/L (ref 136–145)
Total Protein: 7 g/dL (ref 6.4–8.3)

## 2017-01-06 MED ORDER — ALBUTEROL SULFATE HFA 108 (90 BASE) MCG/ACT IN AERS: 1.0000 | INHALATION_SPRAY | Freq: Four times a day (QID) | RESPIRATORY_TRACT | 2 refills | Status: DC | PRN

## 2017-01-06 MED ORDER — LEVOFLOXACIN 500 MG PO TABS: 500.0000 mg | ORAL_TABLET | Freq: Every day | ORAL | 0 refills | Status: DC

## 2017-01-06 NOTE — Assessment & Plan Note (Signed)
Patient has been diagnosed with both breast and lung cancer.  She continues to take Faslodex injections on a monthly basis.  She is scheduled to return for her next set of labs and her next Faslodex injection on 01/20/2017.

## 2017-01-06 NOTE — Progress Notes (Signed)
SYMPTOM MANAGEMENT CLINIC    Chief Complaint: Bronchitis  HPI:  Crystal [name] 61 y.o. female diagnosed with both lung and breast cancer.  Currently undergoing Faslodex injections.    No history exists.    Review of Systems  HENT: Positive for congestion and sore throat.   Respiratory: Positive for cough, shortness of breath and wheezing.   All other systems reviewed and are negative.   Past Medical History:  Diagnosis Date  . Anxiety   . Cancer ([place])    right breast  . Complication of anesthesia    hard to wake up  . GERD (gastroesophageal reflux disease)   . History of small bowel obstruction   . Insomnia   . Liver masses     Past Surgical History:  Procedure Laterality Date  . BREAST SURGERY Right   . HERNIA REPAIR     incisional hernia repair x3  . laparotomies    . LIVER SURGERY    . LOBECTOMY Right 11/29/2015   Procedure: Completion Right Upper  LOBECTOMY, Lymph node dissection, on Q placement;  Surgeon: Grace Isaac, MD;  Location: [place];  Service: Thoracic;  Laterality: Right;  . MASTECTOMY W/ SENTINEL NODE BIOPSY Right 07/05/2015   Procedure: RIGHT MASTECTOMY WITH SENTINEL NODE BIOPSY AND PORT A CATH INSERTION;  Surgeon: [name], MD;  Location: [place];  Service: General;  Laterality: Right;  . PORTACATH PLACEMENT Left 07/05/2015   Procedure: INSERTION PORT-A-CATH;  Surgeon: [name], MD;  Location: [place];  Service: General;  Laterality: Left;  Marland Kitchen VIDEO ASSISTED THORACOSCOPY (VATS)/WEDGE RESECTION Right 11/29/2015   Procedure: BRONCHOSCOPY , VIDEO ASSISTED THORACOSCOPY (VATS)/WEDGE RESECTION;  Surgeon: Grace Isaac, MD;  Location: [place];  Service: Thoracic;  Laterality: Right;    has Cancer of overlapping sites of right female breast ([place]); Recurrent cancer of right breast ([place]); Genetic testing; Adenoma of liver; S/P lobectomy of lung; Lung cancer, right upper lobe ([place]); Rash; and Bronchitis on  her problem list.    is allergic to promethazine hcl; tylenol [acetaminophen]; and penicillins.  Allergies as of 01/06/2017      Reactions   Promethazine Hcl Other (See Comments)   Causes shaking   Tylenol [acetaminophen] Other (See Comments)   Avoids Tylenol products due to liver disease   Penicillins Rash      Medication List       Accurate as of 01/06/17 10:48 AM. Always use your most recent med list.          albuterol 108 (90 Base) MCG/ACT inhaler Commonly known as:  PROVENTIL HFA;VENTOLIN HFA Inhale 1-2 puffs into the lungs every 6 (six) hours as needed for wheezing or shortness of breath.   b complex vitamins tablet Take 1 tablet by mouth daily.   calcium-vitamin D 500-200 MG-UNIT tablet Commonly known as:  OSCAL WITH D Take 2 tablets by mouth daily.   famotidine 10 MG chewable tablet Commonly known as:  PEPCID AC Chew 10 mg by mouth daily as needed for heartburn.   Fish Oil 500 MG Caps Take 1,000 mg by mouth daily.   fulvestrant 250 MG/5ML injection Commonly known as:  FASLODEX Inject 500 mg into the muscle every 30 (thirty) days. One injection each buttock over 1-2 minutes. Warm prior to use.   glucosamine-chondroitin 500-400 MG tablet Take 1 tablet by mouth 3 (three) times daily.   ibuprofen 200 MG tablet Commonly known as:  ADVIL,MOTRIN Take 200 mg by mouth every 6 (  six) hours as needed for fever or mild pain.   levofloxacin 500 MG tablet Commonly known as:  LEVAQUIN Take 1 tablet (500 mg total) by mouth daily.   multivitamin with minerals tablet Take 1 tablet by mouth daily.   Turmeric 500 MG Tabs Take 1,000 mg by mouth daily.        PHYSICAL EXAMINATION  Oncology Vitals 01/06/2017 12/23/2016  Height - -  Weight 62.46 kg -  Weight (lbs) 137 lbs 11 oz -  BMI (kg/m2) 22.23 kg/m2 -  Temp 98.6 98.4  Pulse 92 69  Resp 18 18  Resp (Historical as of 05/14/12) - -  SpO2 100 100  BSA (m2) 1.71 m2 -   BP Readings from Last 2 Encounters:    01/06/17 (!) 159/58  12/23/16 127/63    Physical Exam  Constitutional: She is oriented to person, place, and time and well-developed, well-nourished, and in no distress.  HENT:  Head: Normocephalic and atraumatic.  Mouth/Throat: Oropharynx is clear and moist.  Moderate nasal congestion, but no facial tenderness with palpation.  Posterior oropharynx with mild erythema but no exudate.  Eyes: Conjunctivae and EOM are normal. Pupils are equal, round, and reactive to light. Right eye exhibits no discharge. Left eye exhibits no discharge. No scleral icterus.  Neck: Normal range of motion. Neck supple. No JVD present. No tracheal deviation present. No thyromegaly present.  Cardiovascular: Normal rate, regular rhythm, normal heart sounds and intact distal pulses.   Pulmonary/Chest: Effort normal. No respiratory distress. She has wheezes. She has rales. She exhibits no tenderness.  Abdominal: Soft. Bowel sounds are normal. She exhibits no distension and no mass. There is no tenderness. There is no rebound and no guarding.  Musculoskeletal: Normal range of motion. She exhibits no edema or tenderness.  Lymphadenopathy:    She has no cervical adenopathy.  Neurological: She is alert and oriented to person, place, and time. Gait normal.  Skin: Skin is warm and dry. No rash noted. No erythema. No pallor.  Psychiatric: Affect normal.  Nursing note and vitals reviewed.   LABORATORY DATA:. Appointment on 01/06/2017  Component Date Value Ref Range Status  . WBC 01/06/2017 8.8  3.9 - 10.3 10e3/uL Final  . NEUT# 01/06/2017 6.8* 1.5 - 6.5 10e3/uL Final  . HGB 01/06/2017 12.3  11.6 - 15.9 g/dL Final  . HCT 01/06/2017 37.1  34.8 - 46.6 % Final  . Platelets 01/06/2017 188  145 - 400 10e3/uL Final  . MCV 01/06/2017 89.9  79.5 - 101.0 fL Final  . MCH 01/06/2017 29.7  25.1 - 34.0 pg Final  . MCHC 01/06/2017 33.1  31.5 - 36.0 g/dL Final  . RBC 01/06/2017 4.13  3.70 - 5.45 10e6/uL Final  . RDW 01/06/2017  13.9  11.2 - 14.5 % Final  . lymph# 01/06/2017 1.2  0.9 - 3.3 10e3/uL Final  . MONO# 01/06/2017 0.6  0.1 - 0.9 10e3/uL Final  . Eosinophils Absolute 01/06/2017 0.2  0.0 - 0.5 10e3/uL Final  . Basophils Absolute 01/06/2017 0.0  0.0 - 0.1 10e3/uL Final  . NEUT% 01/06/2017 77.7* 38.4 - 76.8 % Final  . LYMPH% 01/06/2017 13.5* 14.0 - 49.7 % Final  . MONO% 01/06/2017 6.5  0.0 - 14.0 % Final  . EOS% 01/06/2017 1.8  0.0 - 7.0 % Final  . BASO% 01/06/2017 0.5  0.0 - 2.0 % Final  . Sodium 01/06/2017 141  136 - 145 mEq/L Final  . Potassium 01/06/2017 3.8  3.5 - 5.1 mEq/L Final  .  Chloride 01/06/2017 105  98 - 109 mEq/L Final  . CO2 01/06/2017 27  22 - 29 mEq/L Final  . Glucose 01/06/2017 140  70 - 140 mg/dl Final  . BUN 01/06/2017 9.6  7.0 - 26.0 mg/dL Final  . Creatinine 01/06/2017 0.8  0.6 - 1.1 mg/dL Final  . Total Bilirubin 01/06/2017 0.48  0.20 - 1.20 mg/dL Final  . Alkaline Phosphatase 01/06/2017 133  40 - 150 U/L Final  . AST 01/06/2017 19  5 - 34 U/L Final  . ALT 01/06/2017 28  0 - 55 U/L Final  . Total Protein 01/06/2017 7.0  6.4 - 8.3 g/dL Final  . Albumin 01/06/2017 3.8  3.5 - 5.0 g/dL Final  . Calcium 01/06/2017 9.6  8.4 - 10.4 mg/dL Final  . Anion Gap 01/06/2017 9  3 - 11 mEq/L Final  . EGFR 01/06/2017 81* >90 ml/min/1.73 m2 Final    RADIOGRAPHIC STUDIES: No results found.  ASSESSMENT/PLAN:    Recurrent cancer of right breast [facility]) Patient has been diagnosed with both breast and lung cancer.  She continues to take Faslodex injections on a monthly basis.  She is scheduled to return for her next set of labs and her next Faslodex injection on 01/20/2017.  Bronchitis Patient presented to the [facility] today complaining of nasal congestion and sinus pressure, sore throat, and a congested cough with wheezing.  She denies any actual fever or chills.  She also denies any body aches whatsoever.  She states that she was visiting with her grandchildren last week; and they were sick with  the same symptoms.  Exam today reveals moderate nasal congestion, but no facial tenderness with palpation.  Posterior oropharynx with some erythema but no exudate.  Patient is managing all oral secretions well.  Patient does have a congested cough and breath sounds reveal rhonchi with some wheezing in all lung fields.  Patient in no acute respiratory distress.  Vital signs were stable today and patient was afebrile.  O2 sat was 100% on room air.  Patient does not appear toxic.  Also, all labs were completely within normal limits.  Since patient is allergic to penicillin-patient will be prescribed Levaquin antibiotics and an albuterol inhaler to use.  Note: Patient's husband was having prostate surgery earlier this morning; was anxious to get back to her husband's bedside.  This provider will call patient to review her completely normal.  Metabolic panel lab results.  Patient was also advised to call/return or go directly to the emergency department for any worsening symptoms whatsoever.  Also, advised patient that may need to obtain a chest x-ray for further evaluation if the antibiotics do not improve her symptoms.   Patient stated understanding of all instructions; and was in agreement with this plan of care. The patient knows to call the clinic with any problems, questions or concerns.   Total time spent with patient was 25 minutes;  with greater than 75 percent of that time spent in face to face counseling regarding patient's symptoms,  and coordination of care and follow up.  Disclaimer:This dictation was prepared with Dragon/digital dictation along with [employer]. Any transcriptional errors that result from this process are unintentional.  Drue Second, NP 01/06/2017

## 2017-01-06 NOTE — Assessment & Plan Note (Signed)
Patient presented to the [facility] today complaining of nasal congestion and sinus pressure, sore throat, and a congested cough with wheezing.  She denies any actual fever or chills.  She also denies any body aches whatsoever.  She states that she was visiting with her grandchildren last week; and they were sick with the same symptoms.  Exam today reveals moderate nasal congestion, but no facial tenderness with palpation.  Posterior oropharynx with some erythema but no exudate.  Patient is managing all oral secretions well.  Patient does have a congested cough and breath sounds reveal rhonchi with some wheezing in all lung fields.  Patient in no acute respiratory distress.  Vital signs were stable today and patient was afebrile.  O2 sat was 100% on room air.  Patient does not appear toxic.  Also, all labs were completely within normal limits.  Since patient is allergic to penicillin-patient will be prescribed Levaquin antibiotics and an albuterol inhaler to use.  Note: Patient's husband was having prostate surgery earlier this morning; was anxious to get back to her husband's bedside.  This provider will call patient to review her completely normal.  Metabolic panel lab results.  Patient was also advised to call/return or go directly to the emergency department for any worsening symptoms whatsoever.  Also, advised patient that may need to obtain a chest x-ray for further evaluation if the antibiotics do not improve her symptoms.

## 2017-01-10 ENCOUNTER — Telehealth: Payer: Self-pay | Admitting: Nurse Practitioner

## 2017-01-10 NOTE — Telephone Encounter (Signed)
Called patient to check in with her.  She states that her cough is slightly improved and is only productive of clear secretions.  At this point.  She denies any recent fevers or chills.  Patient was advised to call/return or go directly to the emergency department for any worsening symptoms whatsoever.

## 2017-01-20 ENCOUNTER — Encounter (HOSPITAL_BASED_OUTPATIENT_CLINIC_OR_DEPARTMENT_OTHER): Payer: BLUE CROSS/BLUE SHIELD

## 2017-01-20 ENCOUNTER — Ambulatory Visit: Payer: BLUE CROSS/BLUE SHIELD | Admitting: Oncology

## 2017-01-20 ENCOUNTER — Ambulatory Visit: Payer: BLUE CROSS/BLUE SHIELD

## 2017-01-20 DIAGNOSIS — C50811 Malignant neoplasm of overlapping sites of right female breast: Secondary | ICD-10-CM

## 2017-01-20 DIAGNOSIS — Z5111 Encounter for antineoplastic chemotherapy: Secondary | ICD-10-CM | POA: Diagnosis not present

## 2017-01-20 DIAGNOSIS — C50911 Malignant neoplasm of unspecified site of right female breast: Secondary | ICD-10-CM

## 2017-01-20 DIAGNOSIS — C50411 Malignant neoplasm of upper-outer quadrant of right female breast: Secondary | ICD-10-CM

## 2017-01-20 LAB — COMPREHENSIVE METABOLIC PANEL
ALT: 31 U/L (ref 0–55)
AST: 22 U/L (ref 5–34)
Albumin: 4 g/dL (ref 3.5–5.0)
Alkaline Phosphatase: 129 U/L (ref 40–150)
Anion Gap: 10 mEq/L (ref 3–11)
BUN: 16.6 mg/dL (ref 7.0–26.0)
CALCIUM: 10.3 mg/dL (ref 8.4–10.4)
CHLORIDE: 103 meq/L (ref 98–109)
CO2: 26 meq/L (ref 22–29)
Creatinine: 0.8 mg/dL (ref 0.6–1.1)
EGFR: 85 mL/min/{1.73_m2} — ABNORMAL LOW (ref >90)
Glucose: 77 mg/dl (ref 70–140)
POTASSIUM: 4 meq/L (ref 3.5–5.1)
SODIUM: 139 meq/L (ref 136–145)
Total Bilirubin: 0.31 mg/dL (ref 0.20–1.20)
Total Protein: 7.1 g/dL (ref 6.4–8.3)

## 2017-01-20 LAB — CBC WITH DIFFERENTIAL/PLATELET
BASO%: 1 % (ref 0.0–2.0)
Basophils Absolute: 0.1 10*3/uL (ref 0.0–0.1)
EOS%: 2 % (ref 0.0–7.0)
Eosinophils Absolute: 0.1 10*3/uL (ref 0.0–0.5)
HCT: 40 % (ref 34.8–46.6)
HGB: 13.4 g/dL (ref 11.6–15.9)
LYMPH%: 30.1 % (ref 14.0–49.7)
MCH: 29.4 pg (ref 25.1–34.0)
MCHC: 33.5 g/dL (ref 31.5–36.0)
MCV: 87.8 fL (ref 79.5–101.0)
MONO#: 0.3 10*3/uL (ref 0.1–0.9)
MONO%: 4.6 % (ref 0.0–14.0)
NEUT#: 4.2 10*3/uL (ref 1.5–6.5)
NEUT%: 62.3 % (ref 38.4–76.8)
Platelets: 256 10*3/uL (ref 145–400)
RBC: 4.56 10*6/uL (ref 3.70–5.45)
RDW: 13.3 % (ref 11.2–14.5)
WBC: 6.7 10*3/uL (ref 3.9–10.3)
lymph#: 2 10*3/uL (ref 0.9–3.3)

## 2017-01-20 MED ORDER — FULVESTRANT 250 MG/5ML IM SOLN
500.0000 mg | Freq: Once | INTRAMUSCULAR | Status: AC
  Administered 2017-01-20: 500 mg via INTRAMUSCULAR
  Filled 2017-01-20: qty 10

## 2017-01-20 NOTE — Patient Instructions (Signed)

## 2017-01-27 DIAGNOSIS — C3491 Malignant neoplasm of unspecified part of right bronchus or lung: Secondary | ICD-10-CM | POA: Diagnosis not present

## 2017-01-27 DIAGNOSIS — Z01419 Encounter for gynecological examination (general) (routine) without abnormal findings: Secondary | ICD-10-CM | POA: Diagnosis not present

## 2017-01-27 DIAGNOSIS — Z1151 Encounter for screening for human papillomavirus (HPV): Secondary | ICD-10-CM | POA: Diagnosis not present

## 2017-01-27 DIAGNOSIS — C50919 Malignant neoplasm of unspecified site of unspecified female breast: Secondary | ICD-10-CM | POA: Diagnosis not present

## 2017-01-27 DIAGNOSIS — Z6822 Body mass index (BMI) 22.0-22.9, adult: Secondary | ICD-10-CM | POA: Diagnosis not present

## 2017-02-17 ENCOUNTER — Other Ambulatory Visit (HOSPITAL_BASED_OUTPATIENT_CLINIC_OR_DEPARTMENT_OTHER): Payer: BLUE CROSS/BLUE SHIELD

## 2017-02-17 ENCOUNTER — Ambulatory Visit (HOSPITAL_BASED_OUTPATIENT_CLINIC_OR_DEPARTMENT_OTHER): Payer: BLUE CROSS/BLUE SHIELD

## 2017-02-17 ENCOUNTER — Other Ambulatory Visit: Payer: Self-pay

## 2017-02-17 DIAGNOSIS — C3411 Malignant neoplasm of upper lobe, right bronchus or lung: Secondary | ICD-10-CM

## 2017-02-17 DIAGNOSIS — Z170 Estrogen receptor positive status [ER+]: Secondary | ICD-10-CM

## 2017-02-17 DIAGNOSIS — C50411 Malignant neoplasm of upper-outer quadrant of right female breast: Secondary | ICD-10-CM

## 2017-02-17 DIAGNOSIS — C50911 Malignant neoplasm of unspecified site of right female breast: Secondary | ICD-10-CM

## 2017-02-17 DIAGNOSIS — C50811 Malignant neoplasm of overlapping sites of right female breast: Secondary | ICD-10-CM

## 2017-02-17 DIAGNOSIS — Z5111 Encounter for antineoplastic chemotherapy: Secondary | ICD-10-CM

## 2017-02-17 LAB — CBC WITH DIFFERENTIAL/PLATELET
BASO%: 0.5 % (ref 0.0–2.0)
BASOS ABS: 0 10*3/uL (ref 0.0–0.1)
EOS ABS: 0.1 10*3/uL (ref 0.0–0.5)
EOS%: 1.5 % (ref 0.0–7.0)
HEMATOCRIT: 40.3 % (ref 34.8–46.6)
HEMOGLOBIN: 13.5 g/dL (ref 11.6–15.9)
LYMPH#: 1.6 10*3/uL (ref 0.9–3.3)
LYMPH%: 24.7 % (ref 14.0–49.7)
MCH: 29.7 pg (ref 25.1–34.0)
MCHC: 33.5 g/dL (ref 31.5–36.0)
MCV: 88.5 fL (ref 79.5–101.0)
MONO#: 0.4 10*3/uL (ref 0.1–0.9)
MONO%: 5.8 % (ref 0.0–14.0)
NEUT#: 4.4 10*3/uL (ref 1.5–6.5)
NEUT%: 67.5 % (ref 38.4–76.8)
PLATELETS: 215 10*3/uL (ref 145–400)
RBC: 4.55 10*6/uL (ref 3.70–5.45)
RDW: 13.1 % (ref 11.2–14.5)
WBC: 6.6 10*3/uL (ref 3.9–10.3)

## 2017-02-17 LAB — COMPREHENSIVE METABOLIC PANEL
ALBUMIN: 4 g/dL (ref 3.5–5.0)
ALK PHOS: 143 U/L (ref 40–150)
ALT: 51 U/L (ref 0–55)
AST: 29 U/L (ref 5–34)
Anion Gap: 11 mEq/L (ref 3–11)
BUN: 13.9 mg/dL (ref 7.0–26.0)
CALCIUM: 9.5 mg/dL (ref 8.4–10.4)
CO2: 24 mEq/L (ref 22–29)
Chloride: 108 mEq/L (ref 98–109)
Creatinine: 0.8 mg/dL (ref 0.6–1.1)
EGFR: 81 mL/min/{1.73_m2} — AB (ref >90)
Glucose: 109 mg/dl (ref 70–140)
POTASSIUM: 4.3 meq/L (ref 3.5–5.1)
Sodium: 144 mEq/L (ref 136–145)
Total Bilirubin: 0.3 mg/dL (ref 0.20–1.20)
Total Protein: 7.2 g/dL (ref 6.4–8.3)

## 2017-02-17 MED ORDER — FULVESTRANT 250 MG/5ML IM SOLN
500.0000 mg | Freq: Once | INTRAMUSCULAR | Status: AC
Start: 2017-02-17 — End: 2017-02-17
  Administered 2017-02-17: 500 mg via INTRAMUSCULAR
  Filled 2017-02-17: qty 10

## 2017-02-17 NOTE — Patient Instructions (Signed)

## 2017-03-14 ENCOUNTER — Other Ambulatory Visit: Payer: Self-pay

## 2017-03-14 DIAGNOSIS — C50811 Malignant neoplasm of overlapping sites of right female breast: Secondary | ICD-10-CM

## 2017-03-14 DIAGNOSIS — Z170 Estrogen receptor positive status [ER+]: Principal | ICD-10-CM

## 2017-03-17 ENCOUNTER — Other Ambulatory Visit (HOSPITAL_BASED_OUTPATIENT_CLINIC_OR_DEPARTMENT_OTHER): Payer: BLUE CROSS/BLUE SHIELD

## 2017-03-17 ENCOUNTER — Ambulatory Visit (HOSPITAL_BASED_OUTPATIENT_CLINIC_OR_DEPARTMENT_OTHER): Payer: BLUE CROSS/BLUE SHIELD

## 2017-03-17 VITALS — BP 127/56 | HR 73 | Temp 97.6°F | Resp 16

## 2017-03-17 DIAGNOSIS — C50811 Malignant neoplasm of overlapping sites of right female breast: Secondary | ICD-10-CM

## 2017-03-17 DIAGNOSIS — Z170 Estrogen receptor positive status [ER+]: Principal | ICD-10-CM

## 2017-03-17 DIAGNOSIS — Z5111 Encounter for antineoplastic chemotherapy: Secondary | ICD-10-CM | POA: Diagnosis not present

## 2017-03-17 DIAGNOSIS — C50411 Malignant neoplasm of upper-outer quadrant of right female breast: Secondary | ICD-10-CM

## 2017-03-17 DIAGNOSIS — C50911 Malignant neoplasm of unspecified site of right female breast: Secondary | ICD-10-CM

## 2017-03-17 LAB — COMPREHENSIVE METABOLIC PANEL
ALT: 26 U/L (ref 0–55)
AST: 20 U/L (ref 5–34)
Albumin: 4.1 g/dL (ref 3.5–5.0)
Alkaline Phosphatase: 113 U/L (ref 40–150)
Anion Gap: 11 mEq/L (ref 3–11)
BUN: 13.7 mg/dL (ref 7.0–26.0)
CHLORIDE: 105 meq/L (ref 98–109)
CO2: 24 meq/L (ref 22–29)
CREATININE: 0.8 mg/dL (ref 0.6–1.1)
Calcium: 9.6 mg/dL (ref 8.4–10.4)
EGFR: 84 mL/min/{1.73_m2} — ABNORMAL LOW (ref >90)
Glucose: 80 mg/dl (ref 70–140)
POTASSIUM: 4 meq/L (ref 3.5–5.1)
SODIUM: 141 meq/L (ref 136–145)
Total Bilirubin: 0.31 mg/dL (ref 0.20–1.20)
Total Protein: 7.2 g/dL (ref 6.4–8.3)

## 2017-03-17 LAB — CBC WITH DIFFERENTIAL/PLATELET
BASO%: 0.7 % (ref 0.0–2.0)
Basophils Absolute: 0 10*3/uL (ref 0.0–0.1)
EOS%: 1.7 % (ref 0.0–7.0)
Eosinophils Absolute: 0.1 10*3/uL (ref 0.0–0.5)
HCT: 40.1 % (ref 34.8–46.6)
HGB: 13.3 g/dL (ref 11.6–15.9)
LYMPH%: 30.4 % (ref 14.0–49.7)
MCH: 29.2 pg (ref 25.1–34.0)
MCHC: 33.3 g/dL (ref 31.5–36.0)
MCV: 87.9 fL (ref 79.5–101.0)
MONO#: 0.3 10*3/uL (ref 0.1–0.9)
MONO%: 5.4 % (ref 0.0–14.0)
NEUT#: 3.5 10*3/uL (ref 1.5–6.5)
NEUT%: 61.8 % (ref 38.4–76.8)
Platelets: 209 10*3/uL (ref 145–400)
RBC: 4.56 10*6/uL (ref 3.70–5.45)
RDW: 13.6 % (ref 11.2–14.5)
WBC: 5.7 10*3/uL (ref 3.9–10.3)
lymph#: 1.7 10*3/uL (ref 0.9–3.3)

## 2017-03-17 MED ORDER — FULVESTRANT 250 MG/5ML IM SOLN
500.0000 mg | Freq: Once | INTRAMUSCULAR | Status: AC
  Administered 2017-03-17: 500 mg via INTRAMUSCULAR
  Filled 2017-03-17: qty 10

## 2017-03-17 NOTE — Patient Instructions (Signed)

## 2017-04-11 ENCOUNTER — Other Ambulatory Visit: Payer: Self-pay

## 2017-04-11 DIAGNOSIS — C50811 Malignant neoplasm of overlapping sites of right female breast: Secondary | ICD-10-CM

## 2017-04-11 DIAGNOSIS — C50911 Malignant neoplasm of unspecified site of right female breast: Secondary | ICD-10-CM

## 2017-04-11 DIAGNOSIS — Z170 Estrogen receptor positive status [ER+]: Principal | ICD-10-CM

## 2017-04-14 ENCOUNTER — Other Ambulatory Visit (HOSPITAL_BASED_OUTPATIENT_CLINIC_OR_DEPARTMENT_OTHER): Payer: BLUE CROSS/BLUE SHIELD

## 2017-04-14 ENCOUNTER — Ambulatory Visit (HOSPITAL_BASED_OUTPATIENT_CLINIC_OR_DEPARTMENT_OTHER): Payer: BLUE CROSS/BLUE SHIELD

## 2017-04-14 VITALS — BP 144/88 | HR 73 | Temp 97.6°F | Resp 18

## 2017-04-14 DIAGNOSIS — C50911 Malignant neoplasm of unspecified site of right female breast: Secondary | ICD-10-CM

## 2017-04-14 DIAGNOSIS — Z170 Estrogen receptor positive status [ER+]: Principal | ICD-10-CM

## 2017-04-14 DIAGNOSIS — C50411 Malignant neoplasm of upper-outer quadrant of right female breast: Secondary | ICD-10-CM

## 2017-04-14 DIAGNOSIS — Z5111 Encounter for antineoplastic chemotherapy: Secondary | ICD-10-CM | POA: Diagnosis not present

## 2017-04-14 DIAGNOSIS — C50811 Malignant neoplasm of overlapping sites of right female breast: Secondary | ICD-10-CM

## 2017-04-14 LAB — CBC WITH DIFFERENTIAL/PLATELET
BASO%: 0.5 % (ref 0.0–2.0)
Basophils Absolute: 0 10*3/uL (ref 0.0–0.1)
EOS ABS: 0.2 10*3/uL (ref 0.0–0.5)
EOS%: 2.6 % (ref 0.0–7.0)
HCT: 43.8 % (ref 34.8–46.6)
HGB: 14.1 g/dL (ref 11.6–15.9)
LYMPH%: 28.8 % (ref 14.0–49.7)
MCH: 29.1 pg (ref 25.1–34.0)
MCHC: 32.2 g/dL (ref 31.5–36.0)
MCV: 90.3 fL (ref 79.5–101.0)
MONO#: 0.4 10*3/uL (ref 0.1–0.9)
MONO%: 6.6 % (ref 0.0–14.0)
NEUT%: 61.5 % (ref 38.4–76.8)
NEUTROS ABS: 4.1 10*3/uL (ref 1.5–6.5)
PLATELETS: 210 10*3/uL (ref 145–400)
RBC: 4.85 10*6/uL (ref 3.70–5.45)
RDW: 13.7 % (ref 11.2–14.5)
WBC: 6.7 10*3/uL (ref 3.9–10.3)
lymph#: 1.9 10*3/uL (ref 0.9–3.3)

## 2017-04-14 LAB — COMPREHENSIVE METABOLIC PANEL
ALT: 38 U/L (ref 0–55)
ANION GAP: 11 meq/L (ref 3–11)
AST: 26 U/L (ref 5–34)
Albumin: 4.3 g/dL (ref 3.5–5.0)
Alkaline Phosphatase: 133 U/L (ref 40–150)
BILIRUBIN TOTAL: 0.37 mg/dL (ref 0.20–1.20)
BUN: 13.8 mg/dL (ref 7.0–26.0)
CHLORIDE: 103 meq/L (ref 98–109)
CO2: 28 meq/L (ref 22–29)
Calcium: 9.9 mg/dL (ref 8.4–10.4)
Creatinine: 0.8 mg/dL (ref 0.6–1.1)
EGFR: 80 mL/min/{1.73_m2} — AB (ref >90)
GLUCOSE: 87 mg/dL (ref 70–140)
Potassium: 4.5 mEq/L (ref 3.5–5.1)
SODIUM: 142 meq/L (ref 136–145)
TOTAL PROTEIN: 7.7 g/dL (ref 6.4–8.3)

## 2017-04-14 MED ORDER — FULVESTRANT 250 MG/5ML IM SOLN
500.0000 mg | Freq: Once | INTRAMUSCULAR | Status: AC
  Administered 2017-04-14: 500 mg via INTRAMUSCULAR
  Filled 2017-04-14: qty 10

## 2017-04-14 NOTE — Patient Instructions (Signed)

## 2017-04-22 DIAGNOSIS — Z0000 Encounter for general adult medical examination without abnormal findings: Secondary | ICD-10-CM | POA: Diagnosis not present

## 2017-05-12 ENCOUNTER — Other Ambulatory Visit (HOSPITAL_BASED_OUTPATIENT_CLINIC_OR_DEPARTMENT_OTHER): Payer: BLUE CROSS/BLUE SHIELD

## 2017-05-12 ENCOUNTER — Ambulatory Visit (HOSPITAL_BASED_OUTPATIENT_CLINIC_OR_DEPARTMENT_OTHER): Payer: BLUE CROSS/BLUE SHIELD

## 2017-05-12 VITALS — BP 143/75 | HR 68 | Temp 98.5°F | Resp 16

## 2017-05-12 DIAGNOSIS — C50811 Malignant neoplasm of overlapping sites of right female breast: Secondary | ICD-10-CM

## 2017-05-12 DIAGNOSIS — C50911 Malignant neoplasm of unspecified site of right female breast: Secondary | ICD-10-CM

## 2017-05-12 DIAGNOSIS — Z170 Estrogen receptor positive status [ER+]: Principal | ICD-10-CM

## 2017-05-12 DIAGNOSIS — C50411 Malignant neoplasm of upper-outer quadrant of right female breast: Secondary | ICD-10-CM

## 2017-05-12 DIAGNOSIS — Z5111 Encounter for antineoplastic chemotherapy: Secondary | ICD-10-CM | POA: Diagnosis not present

## 2017-05-12 LAB — COMPREHENSIVE METABOLIC PANEL
ALT: 27 U/L (ref 0–55)
AST: 21 U/L (ref 5–34)
Albumin: 4.2 g/dL (ref 3.5–5.0)
Alkaline Phosphatase: 129 U/L (ref 40–150)
Anion Gap: 10 mEq/L (ref 3–11)
BUN: 16.5 mg/dL (ref 7.0–26.0)
CHLORIDE: 104 meq/L (ref 98–109)
CO2: 26 meq/L (ref 22–29)
Calcium: 9.9 mg/dL (ref 8.4–10.4)
Creatinine: 0.8 mg/dL (ref 0.6–1.1)
EGFR: 80 mL/min/{1.73_m2} — AB (ref >90)
GLUCOSE: 95 mg/dL (ref 70–140)
POTASSIUM: 4.3 meq/L (ref 3.5–5.1)
SODIUM: 140 meq/L (ref 136–145)
Total Bilirubin: 0.27 mg/dL (ref 0.20–1.20)
Total Protein: 7.4 g/dL (ref 6.4–8.3)

## 2017-05-12 LAB — CBC WITH DIFFERENTIAL/PLATELET
BASO%: 0.8 % (ref 0.0–2.0)
BASOS ABS: 0.1 10*3/uL (ref 0.0–0.1)
EOS ABS: 0.1 10*3/uL (ref 0.0–0.5)
EOS%: 1.9 % (ref 0.0–7.0)
HCT: 41.5 % (ref 34.8–46.6)
HEMOGLOBIN: 13.8 g/dL (ref 11.6–15.9)
LYMPH%: 27.5 % (ref 14.0–49.7)
MCH: 29.2 pg (ref 25.1–34.0)
MCHC: 33.2 g/dL (ref 31.5–36.0)
MCV: 87.8 fL (ref 79.5–101.0)
MONO#: 0.3 10*3/uL (ref 0.1–0.9)
MONO%: 5.2 % (ref 0.0–14.0)
NEUT#: 4.2 10*3/uL (ref 1.5–6.5)
NEUT%: 64.6 % (ref 38.4–76.8)
Platelets: 228 10*3/uL (ref 145–400)
RBC: 4.72 10*6/uL (ref 3.70–5.45)
RDW: 13.3 % (ref 11.2–14.5)
WBC: 6.6 10*3/uL (ref 3.9–10.3)
lymph#: 1.8 10*3/uL (ref 0.9–3.3)

## 2017-05-12 MED ORDER — FULVESTRANT 250 MG/5ML IM SOLN
500.0000 mg | Freq: Once | INTRAMUSCULAR | Status: AC
  Administered 2017-05-12: 500 mg via INTRAMUSCULAR
  Filled 2017-05-12: qty 10

## 2017-05-12 NOTE — Patient Instructions (Signed)

## 2017-05-22 ENCOUNTER — Telehealth: Payer: Self-pay | Admitting: Oncology

## 2017-05-22 ENCOUNTER — Telehealth: Payer: Self-pay | Admitting: *Deleted

## 2017-05-22 NOTE — Telephone Encounter (Signed)
Received VM from patient requesting call back re: rash on leg. TCT patient. She states she has a blister/pot on the back of her right thigh that is similar to the shingles she had in January on the other leg.  She would like to be seen today or tomorrow as she is going out of town this weekend. She would like to start treatment if this is shingles before she leaves town. Spoke with Thedore Mins, NP. She states she can see her tomorrow.  Urgent LOS sent for apt tomorrow and for scheduler to notify patient of date and time of appt.

## 2017-05-22 NOTE — Telephone Encounter (Signed)
sw pt to confirm 8/10 appt at 1030 per sch msg

## 2017-05-22 NOTE — Progress Notes (Signed)
ID: Crystal [name]   DOB: Nov 06, 1955  MR#: [id number]  WUJ#:[id number]  PCP: Seward Carol, MD GYN: SU: [name] MD OTHER MD: Earle Gell, Daneen Schick, Arnoldo Hooker Thimmappa]  CHIEF COMPLAINT: recurrent estrogen receptor positive breast cancer in previously irradiated breast; pT1a lung cancer  CURRENT TREATMENT: Fulvestrant  BREAST CANCER HISTORY:   From the original intake note:  The patient had bilateral diagnostic mammography 02/12/2011.  She has a history of cystic changes noted on prior studies in April 2011 and April 2010.  On the Feb 12, 2011 study there were new calcifications identified superiorly in the right breast, without any obvious mass.  The patient was brought back for further studies, 02/19/2011 and Dr. Marcelo Baldy was able to localize the calcifications under stereotactic guidance.  The biopsy ([id number]) showed high-grade ductal carcinoma with papillary features which was 70% estrogen receptor positive, 0% progesterone receptor positive.  There was no definitive invasion and therefore further studies were not performed.   Bilateral breast MRIs were obtained Feb 26, 2011.  This showed marked nodular enhancement throughout both breasts, and in the upper central portion there was a post biopsy area of change measuring up to 3.7 cm.  Most of this is a fluid cavity with a thin rim of enhancement.  There were no suspicious areas in the left breast.  No internal mammary or axillary lymph nodes.  Of course, the patient has a history of hepatic adenomas and these were noted incidentally on the MRI.   Her definitive surgical treatment and subsequent history are as detailed below.  INTERVAL HISTORY: Arriona is here today for evaluation of skin rash on her right posterior thigh. This popped up a couple of days ago.  She had shingles once before and she is nervous it could be that.  It is not burning, she has no pain, and is does not itch.   REVIEW OF SYSTEMS: A detailed ros was  conducted and other than what is noted above is non contributory.  PAST MEDICAL HISTORY: Past Medical History:  Diagnosis Date  . Anxiety   . Cancer ([place])    right breast  . Complication of anesthesia    hard to wake up  . GERD (gastroesophageal reflux disease)   . History of small bowel obstruction   . Insomnia   . Liver masses   history of small-bowel obstruction with partial colectomy March 2001.  The patient has a history of hepatic adenomas.  She has had liver resections x2.  The data for this is at [facility] and we will try to obtain it.  She has also had incisional hernia repair.  She has a history of osteopenia, and she has a history of multiple anti-red cell antibodies making a transfusion difficult.  The patient tells me that she has a history of low calcium and a tendency to get liver injections.  PAST SURGICAL HISTORY: Past Surgical History:  Procedure Laterality Date  . BREAST SURGERY Right   . HERNIA REPAIR     incisional hernia repair x3  . laparotomies    . LIVER SURGERY    . LOBECTOMY Right 11/29/2015   Procedure: Completion Right Upper  LOBECTOMY, Lymph node dissection, on Q placement;  Surgeon: Grace Isaac, MD;  Location: [facility];  Service: Thoracic;  Laterality: Right;  . MASTECTOMY W/ SENTINEL NODE BIOPSY Right 07/05/2015   Procedure: RIGHT MASTECTOMY WITH SENTINEL NODE BIOPSY AND PORT A CATH INSERTION;  Surgeon: [name], MD;  Location: [facility];  Service: General;  Laterality: Right;  . PORTACATH PLACEMENT Left 07/05/2015   Procedure: INSERTION PORT-A-CATH;  Surgeon: [name], MD;  Location: [facility];  Service: General;  Laterality: Left;  Marland Kitchen VIDEO ASSISTED THORACOSCOPY (VATS)/WEDGE RESECTION Right 11/29/2015   Procedure: BRONCHOSCOPY , VIDEO ASSISTED THORACOSCOPY (VATS)/WEDGE RESECTION;  Surgeon: Grace Isaac, MD;  Location: [facility];  Service: Thoracic;  Laterality: Right;    FAMILY HISTORY Family History   Problem Relation Age of Onset  . Ovarian cancer Mother 62       "metastasized to breasts"  . Breast cancer Maternal Grandmother 48       2-3 recurrences  . Stomach cancer Maternal Grandfather        dx. 47-49; metastatic stomach cancer  . Colon polyps Father        about 2-3 polyps removed at each colonoscopy  . Colon polyps Brother        "a few"  . Other Maternal Aunt        +TAH  . Colon cancer Paternal Aunt 90  . Brain cancer Daughter 17       astrocytoma  . Breast cancer Cousin        dx. 73s  The patient's mother died from ovarian cancer at the age of 29.  The patient's father is alive at age 90.  She has 2 brothers, no sisters.  A maternal grandmother was diagnosed with breast cancer at the age of 38, and the maternal grandfather had stomach cancer at the age of 35.  GYNECOLOGIC HISTORY: The patient had menarche at age 34.  Her last menstrual period was in 2005.  She never used hormone replacement.  She used birth control briefly to control bleeding prior to 1980.  She was 61 years old at the birth of her first child.  She started having mammograms at age 61 because of a strong family history.  SOCIAL HISTORY: Virdell works as a Programmer, applications for [employer].   She also heads the local "[employer]" Her husband, Hannia Matchett, is a retired Engineer, maintenance from a [employer] locally.  Son, [name], lives in [place] and works for that [employer]  Daughter, [name], lives in [place] and is a homemaker.   ADVANCED DIRECTIVES: in place  HEALTH MAINTENANCE: Social History  Substance Use Topics  . Smoking status: Never Smoker  . Smokeless tobacco: Never Used  . Alcohol use 0.6 oz/week    1 Glasses of wine per week     Comment: social     Colonoscopy:  PAP:  Bone density:  Lipid panel:  Allergies  Allergen Reactions  . Promethazine Hcl Other (See Comments)    Causes shaking  . Tylenol [Acetaminophen] Other (See Comments)     Avoids Tylenol products due to liver disease   . Penicillins Rash    Current Outpatient Prescriptions  Medication Sig Dispense Refill  . albuterol (PROVENTIL HFA;VENTOLIN HFA) 108 (90 Base) MCG/ACT inhaler Inhale 1-2 puffs into the lungs every 6 (six) hours as needed for wheezing or shortness of breath. 1 Inhaler 2  . b complex vitamins tablet Take 1 tablet by mouth daily.      . calcium-vitamin D (OSCAL WITH D) 500-200 MG-UNIT per tablet Take 2 tablets by mouth daily.    . famotidine (PEPCID AC) 10 MG chewable tablet Chew 10 mg by mouth daily as needed for heartburn.    . fulvestrant (FASLODEX) 250 MG/5ML injection Inject 500  mg into the muscle every 30 (thirty) days. One injection each buttock over 1-2 minutes. Warm prior to use.    Marland Kitchen glucosamine-chondroitin 500-400 MG tablet Take 1 tablet by mouth 3 (three) times daily.      Marland Kitchen ibuprofen (ADVIL,MOTRIN) 200 MG tablet Take 200 mg by mouth every 6 (six) hours as needed for fever or mild pain.     Marland Kitchen levofloxacin (LEVAQUIN) 500 MG tablet Take 1 tablet (500 mg total) by mouth daily. 10 tablet 0  . Multiple Vitamins-Minerals (MULTIVITAMIN WITH MINERALS) tablet Take 1 tablet by mouth daily.      . Omega-3 Fatty Acids (FISH OIL) 500 MG CAPS Take 1,000 mg by mouth daily.    Marland Kitchen triamcinolone (KENALOG) 0.025 % ointment Apply 1 application topically 2 (two) times daily. 30 g 0  . Turmeric 500 MG TABS Take 1,000 mg by mouth daily.     No current facility-administered medications for this visit.     OBJECTIVE:  Vitals:   05/23/17 1024  BP: 124/71  Pulse: 69  Resp: 18  Temp: 97.6 F (36.4 C)  SpO2: 100%     Body mass index is 22.24 kg/m.    ECOG FS: 1 GENERAL: Patient is a well appearing female in no acute distress HEENT:  Sclerae anicteric.  Oropharynx clear and moist. No ulcerations or evidence of oropharyngeal candidiasis. Neck is supple.  NODES:  No cervical, supraclavicular, or axillary lymphadenopathy palpated.  BREAST EXAM:   Deferred. LUNGS:  Clear to auscultation bilaterally.  No wheezes or rhonchi. HEART:  Regular rate and rhythm. No murmur appreciated. ABDOMEN:  Soft, nontender.  Positive, normoactive bowel sounds. No organomegaly palpated. MSK:  No focal spinal tenderness to palpation. Full range of motion bilaterally in the upper extremities. EXTREMITIES:  No peripheral edema.   SKIN:  Small 1cm erythematous dry patch in a slightly circumscribed circle. No nail dyscrasia. NEURO:  Nonfocal. Well oriented.  Appropriate affect.    LAB RESULTS: Lab Results  Component Value Date   WBC 6.6 05/12/2017   NEUTROABS 4.2 05/12/2017   HGB 13.8 05/12/2017   HCT 41.5 05/12/2017   MCV 87.8 05/12/2017   PLT 228 05/12/2017      Chemistry      Component Value Date/Time   NA 140 05/12/2017 0846   K 4.3 05/12/2017 0846   CL 105 12/03/2015 0430   CL 102 03/15/2013 0912   CO2 26 05/12/2017 0846   BUN 16.5 05/12/2017 0846   CREATININE 0.8 05/12/2017 0846      Component Value Date/Time   CALCIUM 9.9 05/12/2017 0846   ALKPHOS 129 05/12/2017 0846   AST 21 05/12/2017 0846   ALT 27 05/12/2017 0846   BILITOT 0.27 05/12/2017 0846       Lab Results  Component Value Date   LABCA2 28 10/20/2012    No components found for: [id number]  No results for input(s): INR in the last 168 hours.  Urinalysis    Component Value Date/Time   COLORURINE YELLOW 11/27/2015 1403    STUDIES: No results found.  ASSESSMENT: 61 y.o.  BRCA 1-2 and p-53 negative [place] woman status post right lumpectomy and sentinel lymph node sampling June 2012 for a T1a N0, Stage IA  invasive ductal carcinoma, grade 3, estrogen receptor 98% positive, progesterone receptor 97% positive, with an MIB-1 of 5% and no HER-2 amplification   (a) note that the initial Right breast biopsy showed DCIS w papillary architecture, estrogen receptor 70% positive, progesterone receptor negative  (1)  completed radiation September 2012.   (2) on  letrozole starting July 2012, discontinued August 2016 with recurrence  RECURRENT DISEASE: (3) right breast upper outer quadrant biopsy 05/23/2015 was positive for a clinical T1c N0, stage IA invasive ductal carcinoma, grade 2, estrogen receptor 60% positive with moderate staining intensity, progesterone receptor negative, with no HER-2 amplification and an MIB-1 of 90%.  (4) status post right simple mastectomy 07/05/2015 for a pT1c pN0, stage IA invasive ductal carcinoma, grade 3, with negative margins. 4 lymph nodes removed with mastectomy were benign.  (a) the patient is not planning on reconstruction  (5) began cyclophosphamide and docetaxel 07/26/2015, repeated every 21 days 4, final dose 09/26/2015  (6) fulvestrant started 10/23/2015  (7) genetics testing 07/20/2015 was normal and did not reveal a deleterious mutation in ATM, BARD1, BRCA1, BRCA2, BRIP1, CDH1, CHEK2, FANCC, MLH1, MSH2, MSH6, NBN, PALB2, PMS2, PTEN, RAD51C, RAD51D, TP53, and XRCC2. Additionally no variant of uncertain significance were found.   LUNG CANCER: (8) RUL spiculated nodule measuring 1.4 cm noted on staging CT scan 06/13/2015-- stable on repeat CT 07/25/15--moderately hot and slightly larger on PET 10/31/2015  (a) status post right upper lobectomy and lymph node dissection 11/29/2015 for a pT1a pN0, stage IA primary lung adenocarcinoma (estrogen, progesterone, and gross cystic disease fluid protein negative, strongly Napsin-A and TTF-1 positive), low-grade, with negative margins   (9) history of multiple hepatic adenomas  PLAN: The rash she has is not shingles.  It is most consistent with eczema.  I prescribed Kenalog ointment for her to apply and offered her reassurance.  She knows to call for any further questions or concerns.    A total of (20) minutes of face-to-face time was spent with this patient with greater than 50% of that time in counseling and care-coordination.   Scot Dock     05/23/2017

## 2017-05-23 ENCOUNTER — Ambulatory Visit (HOSPITAL_BASED_OUTPATIENT_CLINIC_OR_DEPARTMENT_OTHER): Payer: BLUE CROSS/BLUE SHIELD | Admitting: Adult Health

## 2017-05-23 ENCOUNTER — Encounter: Payer: Self-pay | Admitting: Adult Health

## 2017-05-23 VITALS — BP 124/71 | HR 69 | Temp 97.6°F | Resp 18 | Ht 66.0 in | Wt 137.8 lb

## 2017-05-23 DIAGNOSIS — Z170 Estrogen receptor positive status [ER+]: Secondary | ICD-10-CM | POA: Diagnosis not present

## 2017-05-23 DIAGNOSIS — C50811 Malignant neoplasm of overlapping sites of right female breast: Secondary | ICD-10-CM

## 2017-05-23 DIAGNOSIS — C3411 Malignant neoplasm of upper lobe, right bronchus or lung: Secondary | ICD-10-CM | POA: Diagnosis not present

## 2017-05-23 DIAGNOSIS — R21 Rash and other nonspecific skin eruption: Secondary | ICD-10-CM | POA: Diagnosis not present

## 2017-05-23 DIAGNOSIS — C50411 Malignant neoplasm of upper-outer quadrant of right female breast: Secondary | ICD-10-CM | POA: Diagnosis not present

## 2017-05-23 MED ORDER — TRIAMCINOLONE ACETONIDE 0.025 % EX OINT: 1.0000 "application " | TOPICAL_OINTMENT | Freq: Two times a day (BID) | CUTANEOUS | 0 refills | Status: DC

## 2017-06-09 ENCOUNTER — Ambulatory Visit (HOSPITAL_BASED_OUTPATIENT_CLINIC_OR_DEPARTMENT_OTHER): Payer: BLUE CROSS/BLUE SHIELD

## 2017-06-09 ENCOUNTER — Other Ambulatory Visit (HOSPITAL_BASED_OUTPATIENT_CLINIC_OR_DEPARTMENT_OTHER): Payer: BLUE CROSS/BLUE SHIELD

## 2017-06-09 ENCOUNTER — Ambulatory Visit (HOSPITAL_COMMUNITY)
Admission: RE | Admit: 2017-06-09 | Discharge: 2017-06-09 | Disposition: A | Discharge status: Home / Self Care | Payer: BLUE CROSS/BLUE SHIELD | Source: Ambulatory Visit | Attending: Oncology | Admitting: Oncology

## 2017-06-09 ENCOUNTER — Ambulatory Visit (HOSPITAL_BASED_OUTPATIENT_CLINIC_OR_DEPARTMENT_OTHER): Payer: BLUE CROSS/BLUE SHIELD | Admitting: Oncology

## 2017-06-09 VITALS — BP 135/65 | HR 65 | Temp 97.9°F | Resp 18 | Ht 66.0 in | Wt 136.9 lb

## 2017-06-09 DIAGNOSIS — C50911 Malignant neoplasm of unspecified site of right female breast: Secondary | ICD-10-CM

## 2017-06-09 DIAGNOSIS — D229 Melanocytic nevi, unspecified: Secondary | ICD-10-CM | POA: Diagnosis not present

## 2017-06-09 DIAGNOSIS — Z9011 Acquired absence of right breast and nipple: Secondary | ICD-10-CM | POA: Insufficient documentation

## 2017-06-09 DIAGNOSIS — C50411 Malignant neoplasm of upper-outer quadrant of right female breast: Secondary | ICD-10-CM | POA: Diagnosis not present

## 2017-06-09 DIAGNOSIS — Z170 Estrogen receptor positive status [ER+]: Secondary | ICD-10-CM

## 2017-06-09 DIAGNOSIS — C50811 Malignant neoplasm of overlapping sites of right female breast: Secondary | ICD-10-CM | POA: Insufficient documentation

## 2017-06-09 DIAGNOSIS — Z5111 Encounter for antineoplastic chemotherapy: Secondary | ICD-10-CM | POA: Diagnosis not present

## 2017-06-09 DIAGNOSIS — R05 Cough: Secondary | ICD-10-CM

## 2017-06-09 DIAGNOSIS — F5104 Psychophysiologic insomnia: Secondary | ICD-10-CM

## 2017-06-09 DIAGNOSIS — C3411 Malignant neoplasm of upper lobe, right bronchus or lung: Secondary | ICD-10-CM

## 2017-06-09 DIAGNOSIS — R0602 Shortness of breath: Secondary | ICD-10-CM | POA: Diagnosis not present

## 2017-06-09 LAB — COMPREHENSIVE METABOLIC PANEL
ALT: 29 U/L (ref 0–55)
ANION GAP: 8 meq/L (ref 3–11)
AST: 24 U/L (ref 5–34)
Albumin: 4.1 g/dL (ref 3.5–5.0)
Alkaline Phosphatase: 125 U/L (ref 40–150)
BILIRUBIN TOTAL: 0.28 mg/dL (ref 0.20–1.20)
BUN: 16.8 mg/dL (ref 7.0–26.0)
CO2: 26 meq/L (ref 22–29)
CREATININE: 0.8 mg/dL (ref 0.6–1.1)
Calcium: 9.9 mg/dL (ref 8.4–10.4)
Chloride: 107 mEq/L (ref 98–109)
EGFR: 85 mL/min/{1.73_m2} — ABNORMAL LOW (ref >90)
GLUCOSE: 88 mg/dL (ref 70–140)
Potassium: 4.8 mEq/L (ref 3.5–5.1)
Sodium: 141 mEq/L (ref 136–145)
TOTAL PROTEIN: 7.6 g/dL (ref 6.4–8.3)

## 2017-06-09 LAB — CBC WITH DIFFERENTIAL/PLATELET
BASO%: 0.3 % (ref 0.0–2.0)
Basophils Absolute: 0 10*3/uL (ref 0.0–0.1)
EOS%: 2 % (ref 0.0–7.0)
Eosinophils Absolute: 0.1 10*3/uL (ref 0.0–0.5)
HCT: 42.4 % (ref 34.8–46.6)
HEMOGLOBIN: 13.6 g/dL (ref 11.6–15.9)
LYMPH%: 29.1 % (ref 14.0–49.7)
MCH: 29.3 pg (ref 25.1–34.0)
MCHC: 32.1 g/dL (ref 31.5–36.0)
MCV: 91.4 fL (ref 79.5–101.0)
MONO#: 0.4 10*3/uL (ref 0.1–0.9)
MONO%: 5.7 % (ref 0.0–14.0)
NEUT%: 62.9 % (ref 38.4–76.8)
NEUTROS ABS: 4.1 10*3/uL (ref 1.5–6.5)
Platelets: 219 10*3/uL (ref 145–400)
RBC: 4.64 10*6/uL (ref 3.70–5.45)
RDW: 13.9 % (ref 11.2–14.5)
WBC: 6.5 10*3/uL (ref 3.9–10.3)
lymph#: 1.9 10*3/uL (ref 0.9–3.3)

## 2017-06-09 MED ORDER — FULVESTRANT 250 MG/5ML IM SOLN
500.0000 mg | Freq: Once | INTRAMUSCULAR | Status: AC
  Administered 2017-06-09: 500 mg via INTRAMUSCULAR
  Filled 2017-06-09: qty 10

## 2017-06-09 NOTE — Progress Notes (Signed)
ID: Crystal [name]   DOB: 1955-12-15  MR#: [id number]  QIO#:[id number]  PCP: Crystal Carol, MD GYN: SU: Crystal Keens MD OTHER MD: Crystal [name], Crystal [name], Crystal [name]]  CHIEF COMPLAINT: recurrent estrogen receptor positive breast cancer in previously irradiated breast; pT1a lung cancer  CURRENT TREATMENT: Fulvestrant  BREAST CANCER HISTORY:   From the original intake note:  The patient had bilateral diagnostic mammography 02/12/2011.  She has a history of cystic changes noted on prior studies in April 2011 and April 2010.  On the Feb 12, 2011 study there were new calcifications identified superiorly in the right breast, without any obvious mass.  The patient was brought back for further studies, 02/19/2011 and Dr. Marcelo [name] was able to localize the calcifications under stereotactic guidance.  The biopsy ([id number]) showed high-grade ductal carcinoma with papillary features which was 70% estrogen receptor positive, 0% progesterone receptor positive.  There was no definitive invasion and therefore further studies were not performed.   Bilateral breast MRIs were obtained Feb 26, 2011.  This showed marked nodular enhancement throughout both breasts, and in the upper central portion there was a post biopsy area of change measuring up to 3.7 cm.  Most of this is a fluid cavity with a thin rim of enhancement.  There were no suspicious areas in the left breast.  No internal mammary or axillary lymph nodes.  Of course, the patient has a history of hepatic adenomas and these were noted incidentally on the MRI.   Her definitive surgical treatment and subsequent history are as detailed below.  INTERVAL HISTORY: Crystal [name] returns today for follow-up and treatment of her estrogen receptor positive breast cancer. She continues on fulvestrant every 4 weeks, with a dose due today.  She tells me after for 5 days of the dose she feels a little bit tired and that lasts about 2 weeks. She has a history  of chronic insomnia--although she tells me she sleeps about 7 hours a night--and she feels that it also is a little bit worse on the fulvestrant. Aside from that and the discomfort of the shunt itself, she tolerates it well  REVIEW OF SYSTEMS: She has kept a little bit of a cough and is very worried that this may represent recurrent lung cancer. It has been nonproductive, not associated with shortness of breath or pleurisy. She has some "lumps" on the anterior left thigh. She tells me she brought this to her primary care physician and he couldn't feel them. She now has is worried about that. She also has a mole in the front of her chest and another one on the back. She does have a dermatologist but hasn't seen him in many years and can't remember his name. Aside from these issues a detailed review of systems today was stable.  PAST MEDICAL HISTORY: Past Medical History:  Diagnosis Date  . Anxiety   . Cancer ([place])    right breast  . Complication of anesthesia    hard to wake up  . GERD (gastroesophageal reflux disease)   . History of small bowel obstruction   . Insomnia   . Liver masses   history of small-bowel obstruction with partial colectomy March 2001.  The patient has a history of hepatic adenomas.  She has had liver resections x2.  The data for this is at [facility] and we will try to obtain it.  She has also had incisional hernia repair.  She has a history of osteopenia, and she has a history of multiple  anti-red cell antibodies making a transfusion difficult.  The patient tells me that she has a history of low calcium and a tendency to get liver injections.  PAST SURGICAL HISTORY: Past Surgical History:  Procedure Laterality Date  . BREAST SURGERY Right   . HERNIA REPAIR     incisional hernia repair x3  . laparotomies    . LIVER SURGERY    . LOBECTOMY Right 11/29/2015   Procedure: Completion Right Upper  LOBECTOMY, Lymph node dissection, on Q placement;  Surgeon: Crystal Isaac, MD;   Location: [place];  Service: Thoracic;  Laterality: Right;  . MASTECTOMY W/ SENTINEL NODE BIOPSY Right 07/05/2015   Procedure: RIGHT MASTECTOMY WITH SENTINEL NODE BIOPSY AND PORT A CATH INSERTION;  Surgeon: Crystal Keens, MD;  Location: [place];  Service: General;  Laterality: Right;  . PORTACATH PLACEMENT Left 07/05/2015   Procedure: INSERTION PORT-A-CATH;  Surgeon: Crystal Keens, MD;  Location: [place];  Service: General;  Laterality: Left;  Marland Kitchen VIDEO ASSISTED THORACOSCOPY (VATS)/WEDGE RESECTION Right 11/29/2015   Procedure: BRONCHOSCOPY , VIDEO ASSISTED THORACOSCOPY (VATS)/WEDGE RESECTION;  Surgeon: Crystal Isaac, MD;  Location: [facility];  Service: Thoracic;  Laterality: Right;    FAMILY HISTORY Family History  Problem Relation Age of Onset  . Ovarian cancer Mother 43       "metastasized to breasts"  . Breast cancer Maternal Grandmother 48       2-3 recurrences  . Stomach cancer Maternal Grandfather        dx. 47-49; metastatic stomach cancer  . Colon polyps Father        about 2-3 polyps removed at each colonoscopy  . Colon polyps Brother        "a few"  . Other Maternal Aunt        +TAH  . Colon cancer Paternal Aunt 90  . Brain cancer Daughter 17       astrocytoma  . Breast cancer Cousin        dx. 85s  The patient's mother died from ovarian cancer at the age of 87.  The patient's father is alive at age 70.  She has 2 brothers, no sisters.  A maternal grandmother was diagnosed with breast cancer at the age of 49, and the maternal grandfather had stomach cancer at the age of 28.  GYNECOLOGIC HISTORY: The patient had menarche at age 31.  Her last menstrual period was in 2005.  She never used hormone replacement.  She used birth control briefly to control bleeding prior to 1980.  She was 61 years old at the birth of her first child.  She started having mammograms at age 26 because of a strong family history.  SOCIAL HISTORY: Crystal [name] works as a  Programmer, applications for [employer].   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  Son, [name], lives in [place] and works for that [employer]  Daughter, [name], lives in [place] and is a homemaker.   ADVANCED DIRECTIVES: in place  HEALTH MAINTENANCE: Social History  Substance Use Topics  . Smoking status: Never Smoker  . Smokeless tobacco: Never Used  . Alcohol use 0.6 oz/week    1 Glasses of wine per week     Comment: social     Colonoscopy:  PAP:  Bone density:  Lipid panel:  Allergies  Allergen Reactions  . Promethazine Hcl Other (  See Comments)    Causes shaking  . Tylenol [Acetaminophen] Other (See Comments)    Avoids Tylenol products due to liver disease   . Penicillins Rash    Current Outpatient Prescriptions  Medication Sig Dispense Refill  . b complex vitamins tablet Take 1 tablet by mouth daily.      . calcium-vitamin D (OSCAL WITH D) 500-200 MG-UNIT per tablet Take 2 tablets by mouth daily.    . famotidine (PEPCID AC) 10 MG chewable tablet Chew 10 mg by mouth daily as needed for heartburn.    . fulvestrant (FASLODEX) 250 MG/5ML injection Inject 500 mg into the muscle every 30 (thirty) days. One injection each buttock over 1-2 minutes. Warm prior to use.    Marland Kitchen glucosamine-chondroitin 500-400 MG tablet Take 1 tablet by mouth 3 (three) times daily.      Marland Kitchen ibuprofen (ADVIL,MOTRIN) 200 MG tablet Take 200 mg by mouth every 6 (six) hours as needed for fever or mild pain.     . Multiple Vitamins-Minerals (MULTIVITAMIN WITH MINERALS) tablet Take 1 tablet by mouth daily.      . Omega-3 Fatty Acids (FISH OIL) 500 MG CAPS Take 1,000 mg by mouth daily.    Marland Kitchen triamcinolone (KENALOG) 0.025 % ointment Apply 1 application topically 2 (two) times daily. 30 g 0  . Turmeric 500 MG TABS Take 1,000 mg by mouth daily.     No current facility-administered medications  for this visit.     OBJECTIVE: Middle-aged white woman who appears stated age  40:   06/09/17 0831  BP: 135/65  Pulse: 65  Resp: 18  Temp: 97.9 F (36.6 C)  SpO2: 100%     Body mass index is 22.1 kg/m.    ECOG FS: 1  Sclerae unicteric, pupils round and equal Oropharynx clear and moist No cervical or supraclavicular adenopathy Lungs no rales or rhonchi Heart regular rate and rhythm Abd soft, nontender, positive bowel sounds MSK no focal spinal tenderness, no upper extremity lymphedema Neuro: nonfocal, well oriented, appropriate affect Breasts: The right breast is status post mastectomy, with no evidence of chest wall recurrence. The left breast is unremarkable. Both axillae are benign. Skin: The 2 skin "spots" that she mentions are very small sebaceous cysts. The area of irregularity on the anterior left thigh fat is likely due to fat necrosis. It is clearly not a lymph node    LAB RESULTS: Lab Results  Component Value Date   WBC 6.5 06/09/2017   NEUTROABS 4.1 06/09/2017   HGB 13.6 06/09/2017   HCT 42.4 06/09/2017   MCV 91.4 06/09/2017   PLT 219 06/09/2017      Chemistry      Component Value Date/Time   NA 140 05/12/2017 0846   K 4.3 05/12/2017 0846   CL 105 12/03/2015 0430   CL 102 03/15/2013 0912   CO2 26 05/12/2017 0846   BUN 16.5 05/12/2017 0846   CREATININE 0.8 05/12/2017 0846      Component Value Date/Time   CALCIUM 9.9 05/12/2017 0846   ALKPHOS 129 05/12/2017 0846   AST 21 05/12/2017 0846   ALT 27 05/12/2017 0846   BILITOT 0.27 05/12/2017 0846       Lab Results  Component Value Date   LABCA2 28 10/20/2012    No components found for: [id number]  No results for input(s): INR in the last 168 hours.  Urinalysis    Component Value Date/Time   COLORURINE YELLOW 11/27/2015 1403    STUDIES: No results found.  ASSESSMENT: 61 y.o.  BRCA 1-2 and p-53 negative [place] woman status post right lumpectomy and sentinel lymph node sampling  June 2012 for a T1a N0, Stage IA  invasive ductal carcinoma, grade 3, estrogen receptor 98% positive, progesterone receptor 97% positive, with an MIB-1 of 5% and no HER-2 amplification   (a) note that the initial Right breast biopsy showed DCIS w papillary architecture, estrogen receptor 70% positive, progesterone receptor negative  (1) completed radiation September 2012.   (2) on letrozole starting July 2012, discontinued August 2016 with recurrence  RECURRENT DISEASE: (3) right breast upper outer quadrant biopsy 05/23/2015 was positive for a clinical T1c N0, stage IA invasive ductal carcinoma, grade 2, estrogen receptor 60% positive with moderate staining intensity, progesterone receptor negative, with no HER-2 amplification and an MIB-1 of 90%.  (4) status post right simple mastectomy 07/05/2015 for a pT1c pN0, stage IA invasive ductal carcinoma, grade 3, with negative margins. 4 lymph nodes removed with mastectomy were benign.  (a) the patient is not planning on reconstruction  (5) began cyclophosphamide and docetaxel 07/26/2015, repeated every 21 days 4, final dose 09/26/2015  (6) fulvestrant started 10/23/2015  (7) genetics testing 07/20/2015 was normal and did not reveal a deleterious mutation in ATM, BARD1, BRCA1, BRCA2, BRIP1, CDH1, CHEK2, FANCC, MLH1, MSH2, MSH6, NBN, PALB2, PMS2, PTEN, RAD51C, RAD51D, TP53, and XRCC2. Additionally no variant of uncertain significance were found.   LUNG CANCER: (8) RUL spiculated nodule measuring 1.4 cm noted on staging CT scan 06/13/2015-- stable on repeat CT 07/25/15--moderately hot and slightly larger on PET 10/31/2015  (a) status post right upper lobectomy and lymph node dissection 11/29/2015 for a pT1a pN0, stage IA primary lung adenocarcinoma (estrogen, progesterone, and gross cystic disease fluid protein negative, strongly Napsin-A and TTF-1 positive), low-grade, with negative margins   (9) history of multiple hepatic  adenomas  PLAN: Crystal continues on fulvestrant with good tolerance. The plan is to continue that at least through the end of this year and then consider whether we would like to continue.. In general we do anti-estrogens for 5 years.  I feel her cough is most likely secondary to allergies but were going to go ahead and get a chest x-ray today.  She will have a restaging CT scan of the chest shortly before seeing me with her December dose of fulvestrant  She knows to call for any problems that may develop before her next visit here.   MAGRINAT,GUSTAV C    06/09/2017

## 2017-06-09 NOTE — Patient Instructions (Signed)

## 2017-07-07 ENCOUNTER — Inpatient Hospital Stay: Payer: BLUE CROSS/BLUE SHIELD | Attending: Oncology

## 2017-07-07 ENCOUNTER — Inpatient Hospital Stay (HOSPITAL_BASED_OUTPATIENT_CLINIC_OR_DEPARTMENT_OTHER): Payer: BLUE CROSS/BLUE SHIELD

## 2017-07-07 VITALS — BP 146/65 | HR 73 | Temp 97.8°F | Resp 20

## 2017-07-07 DIAGNOSIS — C50911 Malignant neoplasm of unspecified site of right female breast: Secondary | ICD-10-CM

## 2017-07-07 DIAGNOSIS — C50411 Malignant neoplasm of upper-outer quadrant of right female breast: Secondary | ICD-10-CM

## 2017-07-07 DIAGNOSIS — Z170 Estrogen receptor positive status [ER+]: Principal | ICD-10-CM

## 2017-07-07 DIAGNOSIS — Z5111 Encounter for antineoplastic chemotherapy: Secondary | ICD-10-CM | POA: Diagnosis not present

## 2017-07-07 DIAGNOSIS — C50811 Malignant neoplasm of overlapping sites of right female breast: Secondary | ICD-10-CM

## 2017-07-07 LAB — COMPREHENSIVE METABOLIC PANEL
ALT: 41 U/L (ref 0–55)
ANION GAP: 10 meq/L (ref 3–11)
AST: 27 U/L (ref 5–34)
Albumin: 4 g/dL (ref 3.5–5.0)
Alkaline Phosphatase: 123 U/L (ref 40–150)
BUN: 16.8 mg/dL (ref 7.0–26.0)
CALCIUM: 9.6 mg/dL (ref 8.4–10.4)
CHLORIDE: 104 meq/L (ref 98–109)
CO2: 25 mEq/L (ref 22–29)
Creatinine: 0.8 mg/dL (ref 0.6–1.1)
EGFR: 78 mL/min/{1.73_m2} — AB (ref >90)
Glucose: 130 mg/dl (ref 70–140)
POTASSIUM: 4.3 meq/L (ref 3.5–5.1)
Sodium: 138 mEq/L (ref 136–145)
Total Bilirubin: 0.26 mg/dL (ref 0.20–1.20)
Total Protein: 7.4 g/dL (ref 6.4–8.3)

## 2017-07-07 LAB — CBC WITH DIFFERENTIAL/PLATELET
BASO%: 0.5 % (ref 0.0–2.0)
Basophils Absolute: 0 10*3/uL (ref 0.0–0.1)
EOS%: 3.5 % (ref 0.0–7.0)
Eosinophils Absolute: 0.2 10*3/uL (ref 0.0–0.5)
HEMATOCRIT: 42.8 % (ref 34.8–46.6)
HGB: 13.6 g/dL (ref 11.6–15.9)
LYMPH%: 27.4 % (ref 14.0–49.7)
MCH: 29 pg (ref 25.1–34.0)
MCHC: 31.8 g/dL (ref 31.5–36.0)
MCV: 91.3 fL (ref 79.5–101.0)
MONO#: 0.3 10*3/uL (ref 0.1–0.9)
MONO%: 5 % (ref 0.0–14.0)
NEUT#: 3.8 10*3/uL (ref 1.5–6.5)
NEUT%: 63.6 % (ref 38.4–76.8)
Platelets: 217 10*3/uL (ref 145–400)
RBC: 4.69 10*6/uL (ref 3.70–5.45)
RDW: 13.8 % (ref 11.2–14.5)
WBC: 6 10*3/uL (ref 3.9–10.3)
lymph#: 1.6 10*3/uL (ref 0.9–3.3)

## 2017-07-07 MED ORDER — FULVESTRANT 250 MG/5ML IM SOLN
500.0000 mg | Freq: Once | INTRAMUSCULAR | Status: AC
  Administered 2017-07-07: 500 mg via INTRAMUSCULAR
  Filled 2017-07-07: qty 10

## 2017-07-07 NOTE — Patient Instructions (Signed)

## 2017-08-04 ENCOUNTER — Inpatient Hospital Stay (HOSPITAL_BASED_OUTPATIENT_CLINIC_OR_DEPARTMENT_OTHER): Payer: BLUE CROSS/BLUE SHIELD

## 2017-08-04 ENCOUNTER — Inpatient Hospital Stay: Payer: BLUE CROSS/BLUE SHIELD | Attending: Oncology

## 2017-08-04 VITALS — BP 128/68 | HR 65 | Temp 98.1°F | Resp 18

## 2017-08-04 DIAGNOSIS — C50411 Malignant neoplasm of upper-outer quadrant of right female breast: Secondary | ICD-10-CM

## 2017-08-04 DIAGNOSIS — C50811 Malignant neoplasm of overlapping sites of right female breast: Secondary | ICD-10-CM

## 2017-08-04 DIAGNOSIS — Z170 Estrogen receptor positive status [ER+]: Principal | ICD-10-CM

## 2017-08-04 DIAGNOSIS — C50911 Malignant neoplasm of unspecified site of right female breast: Secondary | ICD-10-CM

## 2017-08-04 DIAGNOSIS — Z5111 Encounter for antineoplastic chemotherapy: Secondary | ICD-10-CM

## 2017-08-04 LAB — CBC WITH DIFFERENTIAL/PLATELET
BASO%: 0.5 % (ref 0.0–2.0)
BASOS ABS: 0 10*3/uL (ref 0.0–0.1)
EOS ABS: 0.2 10*3/uL (ref 0.0–0.5)
EOS%: 2.7 % (ref 0.0–7.0)
HCT: 41.8 % (ref 34.8–46.6)
HGB: 13.4 g/dL (ref 11.6–15.9)
LYMPH%: 26.3 % (ref 14.0–49.7)
MCH: 29 pg (ref 25.1–34.0)
MCHC: 32.1 g/dL (ref 31.5–36.0)
MCV: 90.5 fL (ref 79.5–101.0)
MONO#: 0.4 10*3/uL (ref 0.1–0.9)
MONO%: 6.6 % (ref 0.0–14.0)
NEUT#: 4 10*3/uL (ref 1.5–6.5)
NEUT%: 63.9 % (ref 38.4–76.8)
PLATELETS: 213 10*3/uL (ref 145–400)
RBC: 4.62 10*6/uL (ref 3.70–5.45)
RDW: 13.4 % (ref 11.2–14.5)
WBC: 6.2 10*3/uL (ref 3.9–10.3)
lymph#: 1.6 10*3/uL (ref 0.9–3.3)

## 2017-08-04 LAB — COMPREHENSIVE METABOLIC PANEL
ALK PHOS: 116 U/L (ref 40–150)
ALT: 36 U/L (ref 0–55)
ANION GAP: 9 meq/L (ref 3–11)
AST: 25 U/L (ref 5–34)
Albumin: 4 g/dL (ref 3.5–5.0)
BILIRUBIN TOTAL: 0.33 mg/dL (ref 0.20–1.20)
BUN: 13.6 mg/dL (ref 7.0–26.0)
CO2: 25 meq/L (ref 22–29)
Calcium: 9.4 mg/dL (ref 8.4–10.4)
Chloride: 106 mEq/L (ref 98–109)
Creatinine: 0.8 mg/dL (ref 0.6–1.1)
EGFR: >60 mL/min/{1.73_m2} (ref >60)
GLUCOSE: 81 mg/dL (ref 70–140)
POTASSIUM: 4.2 meq/L (ref 3.5–5.1)
SODIUM: 140 meq/L (ref 136–145)
Total Protein: 7.2 g/dL (ref 6.4–8.3)

## 2017-08-04 MED ORDER — FULVESTRANT 250 MG/5ML IM SOLN
500.0000 mg | Freq: Once | INTRAMUSCULAR | Status: AC
  Administered 2017-08-04: 500 mg via INTRAMUSCULAR
  Filled 2017-08-04: qty 10

## 2017-09-01 ENCOUNTER — Inpatient Hospital Stay (HOSPITAL_BASED_OUTPATIENT_CLINIC_OR_DEPARTMENT_OTHER): Payer: BLUE CROSS/BLUE SHIELD

## 2017-09-01 ENCOUNTER — Inpatient Hospital Stay: Payer: BLUE CROSS/BLUE SHIELD | Attending: Oncology

## 2017-09-01 VITALS — BP 150/80 | HR 70 | Temp 98.3°F | Resp 20

## 2017-09-01 DIAGNOSIS — C50411 Malignant neoplasm of upper-outer quadrant of right female breast: Secondary | ICD-10-CM

## 2017-09-01 DIAGNOSIS — Z5111 Encounter for antineoplastic chemotherapy: Secondary | ICD-10-CM

## 2017-09-01 DIAGNOSIS — C50811 Malignant neoplasm of overlapping sites of right female breast: Secondary | ICD-10-CM

## 2017-09-01 DIAGNOSIS — Z170 Estrogen receptor positive status [ER+]: Principal | ICD-10-CM

## 2017-09-01 DIAGNOSIS — C50911 Malignant neoplasm of unspecified site of right female breast: Secondary | ICD-10-CM

## 2017-09-01 LAB — CBC WITH DIFFERENTIAL/PLATELET
BASO%: 0.5 % (ref 0.0–2.0)
Basophils Absolute: 0 10*3/uL (ref 0.0–0.1)
EOS ABS: 0.1 10*3/uL (ref 0.0–0.5)
EOS%: 1.7 % (ref 0.0–7.0)
HCT: 40.5 % (ref 34.8–46.6)
HEMOGLOBIN: 13.2 g/dL (ref 11.6–15.9)
LYMPH%: 19.6 % (ref 14.0–49.7)
MCH: 28.8 pg (ref 25.1–34.0)
MCHC: 32.5 g/dL (ref 31.5–36.0)
MCV: 88.6 fL (ref 79.5–101.0)
MONO#: 0.4 10*3/uL (ref 0.1–0.9)
MONO%: 5 % (ref 0.0–14.0)
NEUT%: 73.2 % (ref 38.4–76.8)
NEUTROS ABS: 5.5 10*3/uL (ref 1.5–6.5)
Platelets: 204 10*3/uL (ref 145–400)
RBC: 4.57 10*6/uL (ref 3.70–5.45)
RDW: 13.2 % (ref 11.2–14.5)
WBC: 7.5 10*3/uL (ref 3.9–10.3)
lymph#: 1.5 10*3/uL (ref 0.9–3.3)

## 2017-09-01 LAB — COMPREHENSIVE METABOLIC PANEL
ALT: 31 U/L (ref 0–55)
ANION GAP: 9 meq/L (ref 3–11)
AST: 21 U/L (ref 5–34)
Albumin: 4.1 g/dL (ref 3.5–5.0)
Alkaline Phosphatase: 115 U/L (ref 40–150)
BUN: 15.1 mg/dL (ref 7.0–26.0)
CALCIUM: 9.5 mg/dL (ref 8.4–10.4)
CHLORIDE: 105 meq/L (ref 98–109)
CO2: 26 mEq/L (ref 22–29)
CREATININE: 0.8 mg/dL (ref 0.6–1.1)
EGFR: >60 mL/min/{1.73_m2} (ref >60)
Glucose: 86 mg/dl (ref 70–140)
POTASSIUM: 4.1 meq/L (ref 3.5–5.1)
Sodium: 141 mEq/L (ref 136–145)
Total Bilirubin: 0.34 mg/dL (ref 0.20–1.20)
Total Protein: 7.4 g/dL (ref 6.4–8.3)

## 2017-09-01 MED ORDER — FULVESTRANT 250 MG/5ML IM SOLN
500.0000 mg | Freq: Once | INTRAMUSCULAR | Status: AC
  Administered 2017-09-01: 500 mg via INTRAMUSCULAR
  Filled 2017-09-01: qty 10

## 2017-09-01 NOTE — Patient Instructions (Signed)

## 2017-09-08 ENCOUNTER — Telehealth: Payer: Self-pay

## 2017-09-08 MED ORDER — AZITHROMYCIN 250 MG PO TABS: ORAL_TABLET | ORAL | 0 refills | Status: DC

## 2017-09-08 NOTE — Telephone Encounter (Signed)
Pt has cold and cough for the last week. It has gotten worse over the weekend. She has heavyness in her chest and sputum is now turning yellowish. No fever. She stated Dr Jana Hakim prefers to see her.   S/w Dr Jana Hakim, and called pt back. Will rx z-pack. Call in 2 days if not better.

## 2017-09-19 ENCOUNTER — Other Ambulatory Visit: Payer: Self-pay | Admitting: Oncology

## 2017-09-25 ENCOUNTER — Ambulatory Visit (HOSPITAL_COMMUNITY)
Admission: RE | Admit: 2017-09-25 | Discharge: 2017-09-25 | Disposition: A | Discharge status: Home / Self Care | Payer: BLUE CROSS/BLUE SHIELD | Source: Ambulatory Visit | Attending: Oncology | Admitting: Oncology

## 2017-09-25 ENCOUNTER — Other Ambulatory Visit: Payer: Self-pay | Admitting: Oncology

## 2017-09-25 DIAGNOSIS — R918 Other nonspecific abnormal finding of lung field: Secondary | ICD-10-CM | POA: Insufficient documentation

## 2017-09-25 DIAGNOSIS — Z170 Estrogen receptor positive status [ER+]: Secondary | ICD-10-CM | POA: Insufficient documentation

## 2017-09-25 DIAGNOSIS — K769 Liver disease, unspecified: Secondary | ICD-10-CM | POA: Diagnosis not present

## 2017-09-25 DIAGNOSIS — C50811 Malignant neoplasm of overlapping sites of right female breast: Secondary | ICD-10-CM | POA: Diagnosis not present

## 2017-09-25 DIAGNOSIS — Z902 Acquired absence of lung [part of]: Secondary | ICD-10-CM | POA: Insufficient documentation

## 2017-09-25 DIAGNOSIS — C50911 Malignant neoplasm of unspecified site of right female breast: Secondary | ICD-10-CM

## 2017-09-25 MED ORDER — IOPAMIDOL (ISOVUE-300) INJECTION 61%
75.0000 mL | Freq: Once | INTRAVENOUS | Status: AC | PRN
  Administered 2017-09-25: 75 mL via INTRAVENOUS

## 2017-09-25 MED ORDER — IOPAMIDOL (ISOVUE-300) INJECTION 61%
INTRAVENOUS | Status: AC
  Filled 2017-09-25: qty 100

## 2017-09-25 NOTE — Progress Notes (Signed)
Called Chrislyn and gave her the news on her scans, return to

## 2017-09-26 NOTE — Progress Notes (Signed)
ID: Crystal [name]   DOB: March 16, 1956  MR#: [id number]  WVP#:[id number]  PCP: Seward Carol, MD GYN: SU: [name] MD OTHER MD: Earle Gell, Daneen Schick, Arnoldo Hooker Thimmappa]  CHIEF COMPLAINT: recurrent estrogen receptor positive breast cancer in previously irradiated breast; pT1a lung cancer  CURRENT TREATMENT: Fulvestrant  BREAST CANCER HISTORY:   From the original intake note:  The patient had bilateral diagnostic mammography 02/12/2011.  She has a history of cystic changes noted on prior studies in April 2011 and April 2010.  On the Feb 12, 2011 study there were new calcifications identified superiorly in the right breast, without any obvious mass.  The patient was brought back for further studies, 02/19/2011 and Dr. Marcelo Baldy was able to localize the calcifications under stereotactic guidance.  The biopsy ([id number]) showed high-grade ductal carcinoma with papillary features which was 70% estrogen receptor positive, 0% progesterone receptor positive.  There was no definitive invasion and therefore further studies were not performed.   Bilateral breast MRIs were obtained Feb 26, 2011.  This showed marked nodular enhancement throughout both breasts, and in the upper central portion there was a post biopsy area of change measuring up to 3.7 cm.  Most of this is a fluid cavity with a thin rim of enhancement.  There were no suspicious areas in the left breast.  No internal mammary or axillary lymph nodes.  Of course, the patient has a history of hepatic adenomas and these were noted incidentally on the MRI.   Her definitive surgical treatment and subsequent history are as detailed below.  INTERVAL HISTORY: Crystal [name] returns today for follow-up and treatment of her estrogen receptor positive breast cancer, and also for follow-up of her lung cancer.  As far as the breast cancer goes she continues on fulvestrant, with good tolerance. She notes that she used to feel very fatigued for 4-5 days  after receiving the shot, but now she feels a little better. She doesn't have as much pain with the shot anymore.    Since her last visit, she completed a chest CT on 09/25/2017 showing: Stable CT of the chest status post right upper lobectomy. Small left upper lobe lung nodules are unchanged from previous exam. Multiple liver lesions are again noted without significant interval change.   REVIEW OF SYSTEMS: Emagene reports that she is well. She usually has some general fatigue in the afternoon. She notes that she is going to see her grandchildren this weekend. For exercise, she walks about 35-40 miles per week around her house. She has a step counter to keep track of how much she walks. She is still having trouble with insomnia. She notes that on a good night she gets a maximum of 6 hours, but if she has 3 hours of uninterrupted sleep then she feels good. She denies unusual headaches, visual changes, nausea, vomiting, or dizziness. There has been no unusual cough, phlegm production, or pleurisy. This been no change in bowel or bladder habits. She denies unexplained fatigue or unexplained weight loss, bleeding, rash, or fever. A detailed review of systems was otherwise stable.     PAST MEDICAL HISTORY: Past Medical History:  Diagnosis Date  . Anxiety   . Cancer ([place])    right breast  . Complication of anesthesia    hard to wake up  . GERD (gastroesophageal reflux disease)   . History of small bowel obstruction   . Insomnia   . Liver masses   history of small-bowel obstruction with partial colectomy March 2001.  The patient has a history of hepatic adenomas.  She has had liver resections x2.  The data for this is at [facility] and we will try to obtain it.  She has also had incisional hernia repair.  She has a history of osteopenia, and she has a history of multiple anti-red cell antibodies making a transfusion difficult.  The patient tells me that she has a history of low calcium and a tendency to get  liver injections.  PAST SURGICAL HISTORY: Past Surgical History:  Procedure Laterality Date  . BREAST SURGERY Right   . HERNIA REPAIR     incisional hernia repair x3  . laparotomies    . LIVER SURGERY    . LOBECTOMY Right 11/29/2015   Procedure: Completion Right Upper  LOBECTOMY, Lymph node dissection, on Q placement;  Surgeon: Grace Isaac, MD;  Location: [facility];  Service: Thoracic;  Laterality: Right;  . MASTECTOMY W/ SENTINEL NODE BIOPSY Right 07/05/2015   Procedure: RIGHT MASTECTOMY WITH SENTINEL NODE BIOPSY AND PORT A CATH INSERTION;  Surgeon: [name], MD;  Location: [facility];  Service: General;  Laterality: Right;  . PORTACATH PLACEMENT Left 07/05/2015   Procedure: INSERTION PORT-A-CATH;  Surgeon: [name], MD;  Location: [facility];  Service: General;  Laterality: Left;  Marland Kitchen VIDEO ASSISTED THORACOSCOPY (VATS)/WEDGE RESECTION Right 11/29/2015   Procedure: BRONCHOSCOPY , VIDEO ASSISTED THORACOSCOPY (VATS)/WEDGE RESECTION;  Surgeon: Grace Isaac, MD;  Location: [facility];  Service: Thoracic;  Laterality: Right;    FAMILY HISTORY Family History  Problem Relation Age of Onset  . Ovarian cancer Mother 58       "metastasized to breasts"  . Breast cancer Maternal Grandmother 48       2-3 recurrences  . Stomach cancer Maternal Grandfather        dx. 47-49; metastatic stomach cancer  . Colon polyps Father        about 2-3 polyps removed at each colonoscopy  . Colon polyps Brother        "a few"  . Other Maternal Aunt        +TAH  . Colon cancer Paternal Aunt 90  . Brain cancer Daughter 17       astrocytoma  . Breast cancer Cousin        dx. 56s  The patient's mother died from ovarian cancer at the age of 27.  The patient's father is alive at age 57.  She has 2 brothers, no sisters.  A maternal grandmother was diagnosed with breast cancer at the age of 45, and the maternal grandfather had stomach cancer at the age of  63.  GYNECOLOGIC HISTORY: The patient had menarche at age 1.  Her last menstrual period was in 2005.  She never used hormone replacement.  She used birth control briefly to control bleeding prior to 1980.  She was 61 years old at the birth of her first child.  She started having mammograms at age 67 because of a strong family history.  SOCIAL HISTORY: Dana works as a Programmer, applications for [employer].   She also heads the local "[employer]" Her husband, Liyanna Cartwright, is a retired Engineer, maintenance from a [employer] locally.  Son, [name], lives in [place] and works for that [employer]  Daughter, [name], lives in [place] and is a homemaker.   ADVANCED DIRECTIVES: in place  HEALTH MAINTENANCE: Social History   Tobacco Use  . Smoking status:  Never Smoker  . Smokeless tobacco: Never Used  Substance Use Topics  . Alcohol use: Yes    Alcohol/week: 0.6 oz    Types: 1 Glasses of wine per week    Comment: social  . Drug use: No     Colonoscopy:  PAP:  Bone density:  Lipid panel:  Allergies  Allergen Reactions  . Promethazine Hcl Other (See Comments)    Causes shaking  . Tylenol [Acetaminophen] Other (See Comments)    Avoids Tylenol products due to liver disease   . Penicillins Rash    Current Outpatient Medications  Medication Sig Dispense Refill  . b complex vitamins tablet Take 1 tablet by mouth daily.      . calcium-vitamin D (OSCAL WITH D) 500-200 MG-UNIT per tablet Take 2 tablets by mouth daily.    . famotidine (PEPCID AC) 10 MG chewable tablet Chew 10 mg by mouth daily as needed for heartburn.    . fulvestrant (FASLODEX) 250 MG/5ML injection Inject 500 mg into the muscle every 30 (thirty) days. One injection each buttock over 1-2 minutes. Warm prior to use.    Marland Kitchen glucosamine-chondroitin 500-400 MG tablet Take 1 tablet by mouth 3 (three) times daily.      Marland Kitchen ibuprofen (ADVIL,MOTRIN) 200 MG tablet Take 200 mg by mouth every 6  (six) hours as needed for fever or mild pain.     . Multiple Vitamins-Minerals (MULTIVITAMIN WITH MINERALS) tablet Take 1 tablet by mouth daily.      . Omega-3 Fatty Acids (FISH OIL) 500 MG CAPS Take 1,000 mg by mouth daily.    Marland Kitchen triamcinolone (KENALOG) 0.025 % ointment Apply 1 application topically 2 (two) times daily. 30 g 0  . Turmeric 500 MG TABS Take 1,000 mg by mouth daily.     No current facility-administered medications for this visit.     OBJECTIVE: Middle-aged white woman in no acute distress  Vitals:   09/29/17 0955  BP: (!) 121/55  Pulse: 75  Resp: 17  Temp: 97.8 F (36.6 C)  SpO2: 100%     Body mass index is 22.42 kg/m.    ECOG FS: 1  Sclerae unicteric, EOMs intact Oropharynx clear and moist No cervical or supraclavicular adenopathy Lungs no rales or rhonchi Heart regular rate and rhythm Abd soft, nontender, positive bowel sounds MSK no focal spinal tenderness, no right upper extremity lymphedema Neuro: nonfocal, well oriented, appropriate affect Breasts: Status post right mastectomy.  There is no evidence of chest wall recurrence.  The left breast is unremarkable.  Both axillae are benign.  LAB RESULTS: Lab Results  Component Value Date   WBC 5.3 09/29/2017   NEUTROABS 3.1 09/29/2017   HGB 13.7 09/29/2017   HCT 42.2 09/29/2017   MCV 90.0 09/29/2017   PLT 216 09/29/2017      Chemistry      Component Value Date/Time   NA 140 09/29/2017 0830   K 4.6 09/29/2017 0830   CL 105 12/03/2015 0430   CL 102 03/15/2013 0912   CO2 25 09/29/2017 0830   BUN 17.4 09/29/2017 0830   CREATININE 0.8 09/29/2017 0830      Component Value Date/Time   CALCIUM 9.8 09/29/2017 0830   ALKPHOS 123 09/29/2017 0830   AST 22 09/29/2017 0830   ALT 32 09/29/2017 0830   BILITOT 0.26 09/29/2017 0830       Lab Results  Component Value Date   LABCA2 28 10/20/2012    No components found for: QASTM196  No results  for input(s): INR in the last 168 hours.  Urinalysis     Component Value Date/Time   COLORURINE YELLOW 11/27/2015 1403    STUDIES: Ct Chest W Contrast  Result Date: 09/25/2017 CLINICAL DATA:  Followup breast cancer. History of right lung cancer. EXAM: CT CHEST WITH CONTRAST TECHNIQUE: Multidetector CT imaging of the chest was performed during intravenous contrast administration. CONTRAST:  34m ISOVUE-300 IOPAMIDOL (ISOVUE-300) INJECTION 61% COMPARISON:  06/07/2016 FINDINGS: Cardiovascular: Normal heart size.  No pericardial effusion. Mediastinum/Nodes: The trachea appears patent and is midline. Normal appearance of the esophagus. No mediastinal or hilar adenopathy. No axillary or supraclavicular adenopathy. Lungs/Pleura: No pleural effusion. Status post right upper lobectomy. Stable 3 mm left apical nodule. Posterior left upper lobe lung nodule is also stable measuring 3 mm, image 24 of series 5. Stable subpleural nodule in the posterior right lower lobe measuring 3 mm, image 121 of series 5. Upper Abdomen: Liver metastasis again noted. Index lesion within the lateral segment of left lobe of liver measures 4.4 x 2.7 cm, image 162 of series 2. Unchanged from previous exam. Index lesion within right lobe of liver measures 3.7 x 5.4 cm, image 129 of series 2. Also unchanged from previous exam. Index lesion within the left lobe of liver measures 1.9 x 2.2 cm, image 1 image 61 of series 2. Previously this measured the same. Musculoskeletal: No aggressive lytic or sclerotic bone lesions. Status post right mastectomy. IMPRESSION: 1. Stable CT of the chest status post right upper lobectomy. 2. Small left upper lobe lung nodules are unchanged from previous exam. 3. Multiple liver lesions are again noted without significant interval change. Electronically Signed   By: TKerby MoorsM.D.   On: 09/25/2017 12:02    ASSESSMENT: 61y.o.  BRCA 1-2 and p-53 negative [place] woman status post right lumpectomy and sentinel lymph node sampling June 2012 for a T1a N0, Stage  IA  invasive ductal carcinoma, grade 3, estrogen receptor 98% positive, progesterone receptor 97% positive, with an MIB-1 of 5% and no HER-2 amplification   (a) note that the initial Right breast biopsy showed DCIS w papillary architecture, estrogen receptor 70% positive, progesterone receptor negative  (1) completed radiation September 2012.   (2) on letrozole starting July 2012, discontinued August 2016 with recurrence  RECURRENT DISEASE: (3) right breast upper outer quadrant biopsy 05/23/2015 was positive for a clinical T1c N0, stage IA invasive ductal carcinoma, grade 2, estrogen receptor 60% positive with moderate staining intensity, progesterone receptor negative, with no HER-2 amplification and an MIB-1 of 90%.  (4) status post right simple mastectomy 07/05/2015 for a pT1c pN0, stage IA invasive ductal carcinoma, grade 3, with negative margins. 4 lymph nodes removed with mastectomy were benign.  (a) the patient is not planning on reconstruction  (5) began cyclophosphamide and docetaxel 07/26/2015, repeated every 21 days 4, final dose 09/26/2015  (6) fulvestrant started 10/23/2015  (7) genetics testing 07/20/2015 was normal and did not reveal a deleterious mutation in ATM, BARD1, BRCA1, BRCA2, BRIP1, CDH1, CHEK2, FANCC, MLH1, MSH2, MSH6, NBN, PALB2, PMS2, PTEN, RAD51C, RAD51D, TP53, and XRCC2. Additionally no variant of uncertain significance were found.   LUNG CANCER: (8) RUL spiculated nodule measuring 1.4 cm noted on staging CT scan 06/13/2015-- stable on repeat CT 07/25/15--moderately hot and slightly larger on PET 10/31/2015  (a) status post right upper lobectomy and lymph node dissection 11/29/2015 for a pT1a pN0, stage IA primary lung adenocarcinoma (estrogen, progesterone, and gross cystic disease fluid protein negative, strongly Napsin-A and  TTF-1 positive), low-grade, with negative margins   (9) history of multiple hepatic adenomas  PLAN: Jawanna is now nearly 2 years out  from definitive surgery for her stage I lung cancer with no evidence of disease recurrence.  This is favorable.  She is just over 2 years out from definitive surgery for her either recurrence or second ipsilateral breast cancer.  There is no evidence of recurrence of that cancer either.    She tolerates the fulvestrant generally well.  I do not know if the fatigue she experiences is actually due to that or not.  We discussed possibly going off the medication for 3 months and then reassessing.  We could also consider switching to exemestane (recall she is not a good candidate for tamoxifen because of her history of liver lesions).  She is very uncomfortable with the idea of discontinuing fulvestrant.  This is because she has become quite used to the medication and also for insurance reasons.  At this point then the plan is to continue fulvestrant to total of 5 years.  She will see me again in October 2019.  She will have lab work the same day and a CT scan of the chest shortly before that visit.  She knows to call for any issues that may develop before then.  Magrinat, Virgie Dad, MD  09/29/17 10:12 AM Medical Oncology and Hematology [facility] [address] [place], [place] [address] Tel. [phone]    Fax. [phone]  This document serves as a record of services personally performed by Lurline Del, MD. It was created on his behalf by Sheron Nightingale, a trained medical scribe. The creation of this record is based on the scribe's personal observations and the provider's statements to them.   I have reviewed the above documentation for accuracy and completeness, and I agree with the above.

## 2017-09-29 ENCOUNTER — Inpatient Hospital Stay: Payer: BLUE CROSS/BLUE SHIELD | Attending: Oncology

## 2017-09-29 ENCOUNTER — Inpatient Hospital Stay (HOSPITAL_BASED_OUTPATIENT_CLINIC_OR_DEPARTMENT_OTHER): Payer: BLUE CROSS/BLUE SHIELD

## 2017-09-29 ENCOUNTER — Ambulatory Visit (HOSPITAL_BASED_OUTPATIENT_CLINIC_OR_DEPARTMENT_OTHER): Payer: BLUE CROSS/BLUE SHIELD | Admitting: Oncology

## 2017-09-29 VITALS — BP 121/55 | HR 75 | Temp 97.8°F | Resp 17 | Ht 66.0 in | Wt 138.9 lb

## 2017-09-29 DIAGNOSIS — Z5111 Encounter for antineoplastic chemotherapy: Secondary | ICD-10-CM

## 2017-09-29 DIAGNOSIS — Z170 Estrogen receptor positive status [ER+]: Secondary | ICD-10-CM | POA: Diagnosis not present

## 2017-09-29 DIAGNOSIS — G4700 Insomnia, unspecified: Secondary | ICD-10-CM | POA: Diagnosis not present

## 2017-09-29 DIAGNOSIS — C50411 Malignant neoplasm of upper-outer quadrant of right female breast: Secondary | ICD-10-CM

## 2017-09-29 DIAGNOSIS — C3410 Malignant neoplasm of upper lobe, unspecified bronchus or lung: Secondary | ICD-10-CM

## 2017-09-29 DIAGNOSIS — C3411 Malignant neoplasm of upper lobe, right bronchus or lung: Secondary | ICD-10-CM | POA: Diagnosis not present

## 2017-09-29 DIAGNOSIS — K769 Liver disease, unspecified: Secondary | ICD-10-CM

## 2017-09-29 DIAGNOSIS — C50911 Malignant neoplasm of unspecified site of right female breast: Secondary | ICD-10-CM

## 2017-09-29 DIAGNOSIS — R911 Solitary pulmonary nodule: Secondary | ICD-10-CM

## 2017-09-29 DIAGNOSIS — C50811 Malignant neoplasm of overlapping sites of right female breast: Secondary | ICD-10-CM

## 2017-09-29 DIAGNOSIS — D229 Melanocytic nevi, unspecified: Secondary | ICD-10-CM

## 2017-09-29 DIAGNOSIS — C3490 Malignant neoplasm of unspecified part of unspecified bronchus or lung: Secondary | ICD-10-CM

## 2017-09-29 LAB — CBC WITH DIFFERENTIAL/PLATELET
BASO%: 0.6 % (ref 0.0–2.0)
Basophils Absolute: 0 10*3/uL (ref 0.0–0.1)
EOS ABS: 0.2 10*3/uL (ref 0.0–0.5)
EOS%: 4 % (ref 0.0–7.0)
HCT: 42.2 % (ref 34.8–46.6)
HGB: 13.7 g/dL (ref 11.6–15.9)
LYMPH%: 32.5 % (ref 14.0–49.7)
MCH: 29.2 pg (ref 25.1–34.0)
MCHC: 32.5 g/dL (ref 31.5–36.0)
MCV: 90 fL (ref 79.5–101.0)
MONO#: 0.3 10*3/uL (ref 0.1–0.9)
MONO%: 5.5 % (ref 0.0–14.0)
NEUT%: 57.4 % (ref 38.4–76.8)
NEUTROS ABS: 3.1 10*3/uL (ref 1.5–6.5)
Platelets: 216 10*3/uL (ref 145–400)
RBC: 4.69 10*6/uL (ref 3.70–5.45)
RDW: 13.5 % (ref 11.2–14.5)
WBC: 5.3 10*3/uL (ref 3.9–10.3)
lymph#: 1.7 10*3/uL (ref 0.9–3.3)

## 2017-09-29 LAB — COMPREHENSIVE METABOLIC PANEL
ALBUMIN: 4.3 g/dL (ref 3.5–5.0)
ALK PHOS: 123 U/L (ref 40–150)
ALT: 32 U/L (ref 0–55)
AST: 22 U/L (ref 5–34)
Anion Gap: 9 mEq/L (ref 3–11)
BILIRUBIN TOTAL: 0.26 mg/dL (ref 0.20–1.20)
BUN: 17.4 mg/dL (ref 7.0–26.0)
CALCIUM: 9.8 mg/dL (ref 8.4–10.4)
CHLORIDE: 105 meq/L (ref 98–109)
CO2: 25 mEq/L (ref 22–29)
CREATININE: 0.8 mg/dL (ref 0.6–1.1)
EGFR: >60 mL/min/{1.73_m2} (ref >60)
Glucose: 84 mg/dl (ref 70–140)
Potassium: 4.6 mEq/L (ref 3.5–5.1)
Sodium: 140 mEq/L (ref 136–145)
TOTAL PROTEIN: 7.6 g/dL (ref 6.4–8.3)

## 2017-09-29 MED ORDER — FULVESTRANT 250 MG/5ML IM SOLN
INTRAMUSCULAR | Status: AC
  Filled 2017-09-29: qty 5

## 2017-09-29 MED ORDER — FULVESTRANT 250 MG/5ML IM SOLN
500.0000 mg | Freq: Once | INTRAMUSCULAR | Status: AC
  Administered 2017-09-29: 500 mg via INTRAMUSCULAR

## 2017-09-29 NOTE — Patient Instructions (Signed)

## 2017-10-27 ENCOUNTER — Inpatient Hospital Stay: Payer: BLUE CROSS/BLUE SHIELD | Attending: Oncology

## 2017-10-27 ENCOUNTER — Inpatient Hospital Stay: Payer: BLUE CROSS/BLUE SHIELD

## 2017-10-27 VITALS — BP 127/52 | HR 71 | Temp 98.1°F | Resp 18

## 2017-10-27 DIAGNOSIS — C50911 Malignant neoplasm of unspecified site of right female breast: Secondary | ICD-10-CM

## 2017-10-27 DIAGNOSIS — Z170 Estrogen receptor positive status [ER+]: Secondary | ICD-10-CM

## 2017-10-27 DIAGNOSIS — C50811 Malignant neoplasm of overlapping sites of right female breast: Secondary | ICD-10-CM

## 2017-10-27 DIAGNOSIS — C50411 Malignant neoplasm of upper-outer quadrant of right female breast: Secondary | ICD-10-CM | POA: Insufficient documentation

## 2017-10-27 DIAGNOSIS — Z5111 Encounter for antineoplastic chemotherapy: Secondary | ICD-10-CM | POA: Diagnosis not present

## 2017-10-27 LAB — CBC WITH DIFFERENTIAL/PLATELET
BASOS ABS: 0 10*3/uL (ref 0.0–0.1)
BASOS PCT: 1 %
Eosinophils Absolute: 0.1 10*3/uL (ref 0.0–0.5)
Eosinophils Relative: 2 %
HEMATOCRIT: 41.9 % (ref 34.8–46.6)
HEMOGLOBIN: 14 g/dL (ref 11.6–15.9)
Lymphocytes Relative: 27 %
Lymphs Abs: 1.6 10*3/uL (ref 0.9–3.3)
MCH: 29.3 pg (ref 25.1–34.0)
MCHC: 33.3 g/dL (ref 31.5–36.0)
MCV: 87.9 fL (ref 79.5–101.0)
MONOS PCT: 6 %
Monocytes Absolute: 0.4 10*3/uL (ref 0.1–0.9)
NEUTROS PCT: 64 %
Neutro Abs: 4 10*3/uL (ref 1.5–6.5)
Platelets: 208 10*3/uL (ref 145–400)
RBC: 4.77 MIL/uL (ref 3.70–5.45)
RDW: 13.7 % (ref 11.2–16.1)
WBC: 6.1 10*3/uL (ref 3.9–10.3)

## 2017-10-27 LAB — COMPREHENSIVE METABOLIC PANEL
ALBUMIN: 4.3 g/dL (ref 3.5–5.0)
ALK PHOS: 128 U/L (ref 40–150)
ALT: 31 U/L (ref 0–55)
AST: 22 U/L (ref 5–34)
Anion gap: 10 (ref 3–11)
BILIRUBIN TOTAL: 0.4 mg/dL (ref 0.2–1.2)
BUN: 16 mg/dL (ref 7–26)
CALCIUM: 9.7 mg/dL (ref 8.4–10.4)
CO2: 22 mmol/L (ref 22–29)
Chloride: 109 mmol/L (ref 98–109)
Creatinine, Ser: 0.78 mg/dL (ref 0.60–1.10)
GFR calc Af Amer: >60 mL/min (ref >60)
GFR calc non Af Amer: >60 mL/min (ref >60)
GLUCOSE: 78 mg/dL (ref 70–140)
Potassium: 4.1 mmol/L (ref 3.3–4.7)
Sodium: 141 mmol/L (ref 136–145)
TOTAL PROTEIN: 7.5 g/dL (ref 6.4–8.3)

## 2017-10-27 MED ORDER — FULVESTRANT 250 MG/5ML IM SOLN
500.0000 mg | Freq: Once | INTRAMUSCULAR | Status: AC
  Administered 2017-10-27: 500 mg via INTRAMUSCULAR

## 2017-10-27 MED ORDER — FULVESTRANT 250 MG/5ML IM SOLN
INTRAMUSCULAR | Status: AC
  Filled 2017-10-27: qty 5

## 2017-10-27 NOTE — Patient Instructions (Signed)

## 2017-11-24 ENCOUNTER — Inpatient Hospital Stay: Payer: BLUE CROSS/BLUE SHIELD

## 2017-11-24 ENCOUNTER — Inpatient Hospital Stay: Payer: BLUE CROSS/BLUE SHIELD | Attending: Oncology

## 2017-11-24 VITALS — BP 122/78 | HR 69 | Temp 98.2°F | Resp 20

## 2017-11-24 DIAGNOSIS — Z170 Estrogen receptor positive status [ER+]: Principal | ICD-10-CM

## 2017-11-24 DIAGNOSIS — Z5111 Encounter for antineoplastic chemotherapy: Secondary | ICD-10-CM | POA: Insufficient documentation

## 2017-11-24 DIAGNOSIS — C50911 Malignant neoplasm of unspecified site of right female breast: Secondary | ICD-10-CM

## 2017-11-24 DIAGNOSIS — C50811 Malignant neoplasm of overlapping sites of right female breast: Secondary | ICD-10-CM

## 2017-11-24 DIAGNOSIS — C50411 Malignant neoplasm of upper-outer quadrant of right female breast: Secondary | ICD-10-CM | POA: Insufficient documentation

## 2017-11-24 LAB — COMPREHENSIVE METABOLIC PANEL
ALBUMIN: 4.2 g/dL (ref 3.5–5.0)
ALK PHOS: 122 U/L (ref 40–150)
ALT: 32 U/L (ref 0–55)
AST: 23 U/L (ref 5–34)
Anion gap: 9 (ref 3–11)
BILIRUBIN TOTAL: 0.4 mg/dL (ref 0.2–1.2)
BUN: 17 mg/dL (ref 7–26)
CO2: 23 mmol/L (ref 22–29)
CREATININE: 0.77 mg/dL (ref 0.60–1.10)
Calcium: 9.3 mg/dL (ref 8.4–10.4)
Chloride: 107 mmol/L (ref 98–109)
GFR calc Af Amer: >60 mL/min (ref >60)
GFR calc non Af Amer: >60 mL/min (ref >60)
GLUCOSE: 83 mg/dL (ref 70–140)
POTASSIUM: 4.3 mmol/L (ref 3.5–5.1)
Sodium: 139 mmol/L (ref 136–145)
TOTAL PROTEIN: 7.2 g/dL (ref 6.4–8.3)

## 2017-11-24 LAB — CBC WITH DIFFERENTIAL/PLATELET
BASOS ABS: 0 10*3/uL (ref 0.0–0.1)
BASOS PCT: 1 %
Eosinophils Absolute: 0.2 10*3/uL (ref 0.0–0.5)
Eosinophils Relative: 4 %
HEMATOCRIT: 41.6 % (ref 34.8–46.6)
HEMOGLOBIN: 13.6 g/dL (ref 11.6–15.9)
Lymphocytes Relative: 28 %
Lymphs Abs: 1.8 10*3/uL (ref 0.9–3.3)
MCH: 29.3 pg (ref 25.1–34.0)
MCHC: 32.7 g/dL (ref 31.5–36.0)
MCV: 89.7 fL (ref 79.5–101.0)
Monocytes Absolute: 0.3 10*3/uL (ref 0.1–0.9)
Monocytes Relative: 5 %
NEUTROS ABS: 3.9 10*3/uL (ref 1.5–6.5)
NEUTROS PCT: 62 %
Platelets: 196 10*3/uL (ref 145–400)
RBC: 4.64 MIL/uL (ref 3.70–5.45)
RDW: 13.4 % (ref 11.2–14.5)
WBC: 6.2 10*3/uL (ref 3.9–10.3)

## 2017-11-24 MED ORDER — FULVESTRANT 250 MG/5ML IM SOLN
INTRAMUSCULAR | Status: AC
  Filled 2017-11-24: qty 5

## 2017-11-24 MED ORDER — FULVESTRANT 250 MG/5ML IM SOLN
500.0000 mg | Freq: Once | INTRAMUSCULAR | Status: AC
  Administered 2017-11-24: 500 mg via INTRAMUSCULAR

## 2017-11-24 NOTE — Patient Instructions (Signed)

## 2017-12-22 ENCOUNTER — Inpatient Hospital Stay: Payer: BLUE CROSS/BLUE SHIELD

## 2017-12-22 ENCOUNTER — Inpatient Hospital Stay: Payer: BLUE CROSS/BLUE SHIELD | Attending: Oncology

## 2017-12-22 DIAGNOSIS — C50411 Malignant neoplasm of upper-outer quadrant of right female breast: Secondary | ICD-10-CM | POA: Diagnosis not present

## 2017-12-22 DIAGNOSIS — C50911 Malignant neoplasm of unspecified site of right female breast: Secondary | ICD-10-CM

## 2017-12-22 DIAGNOSIS — Z170 Estrogen receptor positive status [ER+]: Principal | ICD-10-CM

## 2017-12-22 DIAGNOSIS — Z5111 Encounter for antineoplastic chemotherapy: Secondary | ICD-10-CM | POA: Insufficient documentation

## 2017-12-22 DIAGNOSIS — C50811 Malignant neoplasm of overlapping sites of right female breast: Secondary | ICD-10-CM

## 2017-12-22 LAB — CBC WITH DIFFERENTIAL/PLATELET
Basophils Absolute: 0 10*3/uL (ref 0.0–0.1)
Basophils Relative: 1 %
Eosinophils Absolute: 0.2 10*3/uL (ref 0.0–0.5)
Eosinophils Relative: 3 %
HEMATOCRIT: 42 % (ref 34.8–46.6)
Hemoglobin: 13.4 g/dL (ref 11.6–15.9)
LYMPHS PCT: 31 %
Lymphs Abs: 1.8 10*3/uL (ref 0.9–3.3)
MCH: 28.8 pg (ref 25.1–34.0)
MCHC: 31.9 g/dL (ref 31.5–36.0)
MCV: 90.1 fL (ref 79.5–101.0)
MONO ABS: 0.3 10*3/uL (ref 0.1–0.9)
MONOS PCT: 5 %
NEUTROS ABS: 3.6 10*3/uL (ref 1.5–6.5)
Neutrophils Relative %: 60 %
Platelets: 219 10*3/uL (ref 145–400)
RBC: 4.66 MIL/uL (ref 3.70–5.45)
RDW: 13.5 % (ref 11.2–14.5)
WBC: 5.9 10*3/uL (ref 3.9–10.3)

## 2017-12-22 LAB — COMPREHENSIVE METABOLIC PANEL
ALBUMIN: 4 g/dL (ref 3.5–5.0)
ALK PHOS: 115 U/L (ref 40–150)
ALT: 31 U/L (ref 0–55)
ANION GAP: 10 (ref 3–11)
AST: 24 U/L (ref 5–34)
BUN: 13 mg/dL (ref 7–26)
CALCIUM: 9.7 mg/dL (ref 8.4–10.4)
CHLORIDE: 108 mmol/L (ref 98–109)
CO2: 23 mmol/L (ref 22–29)
Creatinine, Ser: 0.73 mg/dL (ref 0.60–1.10)
GFR calc Af Amer: >60 mL/min (ref >60)
GFR calc non Af Amer: >60 mL/min (ref >60)
GLUCOSE: 80 mg/dL (ref 70–140)
POTASSIUM: 4.6 mmol/L (ref 3.5–5.1)
SODIUM: 141 mmol/L (ref 136–145)
Total Bilirubin: 0.5 mg/dL (ref 0.2–1.2)
Total Protein: 7.1 g/dL (ref 6.4–8.3)

## 2017-12-22 MED ORDER — FULVESTRANT 250 MG/5ML IM SOLN
INTRAMUSCULAR | Status: AC
  Filled 2017-12-22: qty 5

## 2017-12-22 MED ORDER — FULVESTRANT 250 MG/5ML IM SOLN
500.0000 mg | Freq: Once | INTRAMUSCULAR | Status: AC
  Administered 2017-12-22: 500 mg via INTRAMUSCULAR

## 2017-12-22 NOTE — Progress Notes (Signed)
This encounter was created in error - please disregard.

## 2018-01-13 DIAGNOSIS — H3561 Retinal hemorrhage, right eye: Secondary | ICD-10-CM | POA: Diagnosis not present

## 2018-01-13 DIAGNOSIS — H43811 Vitreous degeneration, right eye: Secondary | ICD-10-CM | POA: Diagnosis not present

## 2018-01-13 DIAGNOSIS — H2513 Age-related nuclear cataract, bilateral: Secondary | ICD-10-CM | POA: Diagnosis not present

## 2018-01-16 ENCOUNTER — Other Ambulatory Visit: Payer: Self-pay | Admitting: *Deleted

## 2018-01-16 DIAGNOSIS — C50811 Malignant neoplasm of overlapping sites of right female breast: Secondary | ICD-10-CM

## 2018-01-16 DIAGNOSIS — Z170 Estrogen receptor positive status [ER+]: Principal | ICD-10-CM

## 2018-01-19 ENCOUNTER — Inpatient Hospital Stay: Payer: BLUE CROSS/BLUE SHIELD | Attending: Oncology

## 2018-01-19 ENCOUNTER — Inpatient Hospital Stay: Payer: BLUE CROSS/BLUE SHIELD

## 2018-01-19 VITALS — BP 126/62 | Temp 98.1°F | Resp 18

## 2018-01-19 DIAGNOSIS — C50411 Malignant neoplasm of upper-outer quadrant of right female breast: Secondary | ICD-10-CM | POA: Insufficient documentation

## 2018-01-19 DIAGNOSIS — C50811 Malignant neoplasm of overlapping sites of right female breast: Secondary | ICD-10-CM

## 2018-01-19 DIAGNOSIS — Z170 Estrogen receptor positive status [ER+]: Principal | ICD-10-CM

## 2018-01-19 DIAGNOSIS — Z5111 Encounter for antineoplastic chemotherapy: Secondary | ICD-10-CM | POA: Diagnosis not present

## 2018-01-19 DIAGNOSIS — C50911 Malignant neoplasm of unspecified site of right female breast: Secondary | ICD-10-CM

## 2018-01-19 LAB — CMP (CANCER CENTER ONLY)
ALT: 31 U/L (ref 0–55)
ANION GAP: 11 (ref 3–11)
AST: 22 U/L (ref 5–34)
Albumin: 4.2 g/dL (ref 3.5–5.0)
Alkaline Phosphatase: 116 U/L (ref 40–150)
BUN: 14 mg/dL (ref 7–26)
CO2: 25 mmol/L (ref 22–29)
Calcium: 9.8 mg/dL (ref 8.4–10.4)
Chloride: 105 mmol/L (ref 98–109)
Creatinine: 0.76 mg/dL (ref 0.60–1.10)
Glucose, Bld: 69 mg/dL — ABNORMAL LOW (ref 70–140)
POTASSIUM: 4.2 mmol/L (ref 3.5–5.1)
Sodium: 141 mmol/L (ref 136–145)
Total Bilirubin: 0.4 mg/dL (ref 0.2–1.2)
Total Protein: 7.5 g/dL (ref 6.4–8.3)

## 2018-01-19 LAB — CBC WITH DIFFERENTIAL (CANCER CENTER ONLY)
Basophils Absolute: 0 10*3/uL (ref 0.0–0.1)
Basophils Relative: 1 %
EOS PCT: 2 %
Eosinophils Absolute: 0.1 10*3/uL (ref 0.0–0.5)
HCT: 42.4 % (ref 34.8–46.6)
Hemoglobin: 14 g/dL (ref 11.6–15.9)
LYMPHS PCT: 27 %
Lymphs Abs: 1.8 10*3/uL (ref 0.9–3.3)
MCH: 29.3 pg (ref 25.1–34.0)
MCHC: 33 g/dL (ref 31.5–36.0)
MCV: 88.6 fL (ref 79.5–101.0)
MONO ABS: 0.3 10*3/uL (ref 0.1–0.9)
Monocytes Relative: 5 %
Neutro Abs: 4.5 10*3/uL (ref 1.5–6.5)
Neutrophils Relative %: 65 %
PLATELETS: 216 10*3/uL (ref 145–400)
RBC: 4.79 MIL/uL (ref 3.70–5.45)
RDW: 13.5 % (ref 11.2–14.5)
WBC Count: 6.8 10*3/uL (ref 3.9–10.3)

## 2018-01-19 MED ORDER — FULVESTRANT 250 MG/5ML IM SOLN
500.0000 mg | Freq: Once | INTRAMUSCULAR | Status: AC
  Administered 2018-01-19: 500 mg via INTRAMUSCULAR

## 2018-01-19 NOTE — Patient Instructions (Signed)

## 2018-02-12 ENCOUNTER — Other Ambulatory Visit: Payer: Self-pay | Admitting: *Deleted

## 2018-02-12 DIAGNOSIS — C50911 Malignant neoplasm of unspecified site of right female breast: Secondary | ICD-10-CM

## 2018-02-16 ENCOUNTER — Inpatient Hospital Stay: Payer: BLUE CROSS/BLUE SHIELD

## 2018-02-16 ENCOUNTER — Inpatient Hospital Stay: Payer: BLUE CROSS/BLUE SHIELD | Attending: Oncology

## 2018-02-16 VITALS — BP 121/76 | HR 71 | Temp 98.5°F | Resp 20

## 2018-02-16 DIAGNOSIS — C50411 Malignant neoplasm of upper-outer quadrant of right female breast: Secondary | ICD-10-CM | POA: Insufficient documentation

## 2018-02-16 DIAGNOSIS — C50911 Malignant neoplasm of unspecified site of right female breast: Secondary | ICD-10-CM

## 2018-02-16 DIAGNOSIS — Z170 Estrogen receptor positive status [ER+]: Secondary | ICD-10-CM

## 2018-02-16 DIAGNOSIS — Z6822 Body mass index (BMI) 22.0-22.9, adult: Secondary | ICD-10-CM | POA: Diagnosis not present

## 2018-02-16 DIAGNOSIS — Z01419 Encounter for gynecological examination (general) (routine) without abnormal findings: Secondary | ICD-10-CM | POA: Diagnosis not present

## 2018-02-16 DIAGNOSIS — C50811 Malignant neoplasm of overlapping sites of right female breast: Secondary | ICD-10-CM

## 2018-02-16 DIAGNOSIS — Z5111 Encounter for antineoplastic chemotherapy: Secondary | ICD-10-CM | POA: Diagnosis not present

## 2018-02-16 LAB — CBC WITH DIFFERENTIAL (CANCER CENTER ONLY)
BASOS ABS: 0 10*3/uL (ref 0.0–0.1)
BASOS PCT: 1 %
Eosinophils Absolute: 0.2 10*3/uL (ref 0.0–0.5)
Eosinophils Relative: 4 %
HEMATOCRIT: 41.2 % (ref 34.8–46.6)
HEMOGLOBIN: 13.4 g/dL (ref 11.6–15.9)
Lymphocytes Relative: 31 %
Lymphs Abs: 1.6 10*3/uL (ref 0.9–3.3)
MCH: 29 pg (ref 25.1–34.0)
MCHC: 32.5 g/dL (ref 31.5–36.0)
MCV: 89.2 fL (ref 79.5–101.0)
Monocytes Absolute: 0.3 10*3/uL (ref 0.1–0.9)
Monocytes Relative: 6 %
NEUTROS ABS: 3.1 10*3/uL (ref 1.5–6.5)
NEUTROS PCT: 58 %
Platelet Count: 206 10*3/uL (ref 145–400)
RBC: 4.62 MIL/uL (ref 3.70–5.45)
RDW: 13.3 % (ref 11.2–14.5)
WBC Count: 5.2 10*3/uL (ref 3.9–10.3)

## 2018-02-16 LAB — CMP (CANCER CENTER ONLY)
ALK PHOS: 121 U/L (ref 40–150)
ALT: 29 U/L (ref 0–55)
ANION GAP: 9 (ref 3–11)
AST: 25 U/L (ref 5–34)
Albumin: 4.3 g/dL (ref 3.5–5.0)
BILIRUBIN TOTAL: 0.3 mg/dL (ref 0.2–1.2)
BUN: 11 mg/dL (ref 7–26)
CO2: 23 mmol/L (ref 22–29)
Calcium: 9.9 mg/dL (ref 8.4–10.4)
Chloride: 108 mmol/L (ref 98–109)
Creatinine: 0.76 mg/dL (ref 0.60–1.10)
GFR, Estimated: >60 mL/min (ref >60)
Glucose, Bld: 80 mg/dL (ref 70–140)
Potassium: 4.5 mmol/L (ref 3.5–5.1)
SODIUM: 140 mmol/L (ref 136–145)
TOTAL PROTEIN: 7.3 g/dL (ref 6.4–8.3)

## 2018-02-16 MED ORDER — FULVESTRANT 250 MG/5ML IM SOLN
INTRAMUSCULAR | Status: AC
  Filled 2018-02-16: qty 5

## 2018-02-16 MED ORDER — FULVESTRANT 250 MG/5ML IM SOLN
500.0000 mg | Freq: Once | INTRAMUSCULAR | Status: AC
  Administered 2018-02-16: 500 mg via INTRAMUSCULAR

## 2018-02-16 NOTE — Patient Instructions (Signed)

## 2018-02-24 DIAGNOSIS — H3561 Retinal hemorrhage, right eye: Secondary | ICD-10-CM | POA: Diagnosis not present

## 2018-02-24 DIAGNOSIS — H43811 Vitreous degeneration, right eye: Secondary | ICD-10-CM | POA: Diagnosis not present

## 2018-02-24 DIAGNOSIS — H43391 Other vitreous opacities, right eye: Secondary | ICD-10-CM | POA: Diagnosis not present

## 2018-02-24 DIAGNOSIS — H2513 Age-related nuclear cataract, bilateral: Secondary | ICD-10-CM | POA: Diagnosis not present

## 2018-03-02 ENCOUNTER — Ambulatory Visit
Admission: RE | Admit: 2018-03-02 | Discharge: 2018-03-02 | Disposition: A | Discharge status: Home / Self Care | Payer: BLUE CROSS/BLUE SHIELD | Source: Ambulatory Visit | Attending: Internal Medicine | Admitting: Internal Medicine

## 2018-03-02 ENCOUNTER — Other Ambulatory Visit: Payer: Self-pay | Admitting: Internal Medicine

## 2018-03-02 DIAGNOSIS — M542 Cervicalgia: Secondary | ICD-10-CM | POA: Diagnosis not present

## 2018-03-13 ENCOUNTER — Other Ambulatory Visit: Payer: Self-pay | Admitting: *Deleted

## 2018-03-13 DIAGNOSIS — C50911 Malignant neoplasm of unspecified site of right female breast: Secondary | ICD-10-CM

## 2018-03-16 ENCOUNTER — Inpatient Hospital Stay: Payer: BLUE CROSS/BLUE SHIELD

## 2018-03-16 ENCOUNTER — Inpatient Hospital Stay: Payer: BLUE CROSS/BLUE SHIELD | Attending: Oncology

## 2018-03-16 VITALS — BP 128/61 | HR 72 | Temp 98.4°F | Resp 18

## 2018-03-16 DIAGNOSIS — C50911 Malignant neoplasm of unspecified site of right female breast: Secondary | ICD-10-CM

## 2018-03-16 DIAGNOSIS — C50811 Malignant neoplasm of overlapping sites of right female breast: Secondary | ICD-10-CM

## 2018-03-16 DIAGNOSIS — Z170 Estrogen receptor positive status [ER+]: Secondary | ICD-10-CM

## 2018-03-16 DIAGNOSIS — C50411 Malignant neoplasm of upper-outer quadrant of right female breast: Secondary | ICD-10-CM | POA: Insufficient documentation

## 2018-03-16 DIAGNOSIS — Z5111 Encounter for antineoplastic chemotherapy: Secondary | ICD-10-CM | POA: Insufficient documentation

## 2018-03-16 LAB — CMP (CANCER CENTER ONLY)
ALBUMIN: 4.2 g/dL (ref 3.5–5.0)
ALK PHOS: 118 U/L (ref 40–150)
ALT: 30 U/L (ref 0–55)
AST: 26 U/L (ref 5–34)
Anion gap: 10 (ref 3–11)
BILIRUBIN TOTAL: 0.6 mg/dL (ref 0.2–1.2)
BUN: 16 mg/dL (ref 7–26)
CALCIUM: 9.4 mg/dL (ref 8.4–10.4)
CO2: 23 mmol/L (ref 22–29)
Chloride: 107 mmol/L (ref 98–109)
Creatinine: 0.77 mg/dL (ref 0.60–1.10)
GFR, Estimated: >60 mL/min (ref >60)
Glucose, Bld: 74 mg/dL (ref 70–140)
Potassium: 4.5 mmol/L (ref 3.5–5.1)
SODIUM: 140 mmol/L (ref 136–145)
Total Protein: 7.2 g/dL (ref 6.4–8.3)

## 2018-03-16 LAB — CBC WITH DIFFERENTIAL (CANCER CENTER ONLY)
BASOS PCT: 1 %
Basophils Absolute: 0 10*3/uL (ref 0.0–0.1)
EOS PCT: 2 %
Eosinophils Absolute: 0.1 10*3/uL (ref 0.0–0.5)
HCT: 40.1 % (ref 34.8–46.6)
Hemoglobin: 13.3 g/dL (ref 11.6–15.9)
Lymphocytes Relative: 29 %
Lymphs Abs: 1.5 10*3/uL (ref 0.9–3.3)
MCH: 29.3 pg (ref 25.1–34.0)
MCHC: 33.2 g/dL (ref 31.5–36.0)
MCV: 88.3 fL (ref 79.5–101.0)
MONO ABS: 0.3 10*3/uL (ref 0.1–0.9)
MONOS PCT: 6 %
Neutro Abs: 3.1 10*3/uL (ref 1.5–6.5)
Neutrophils Relative %: 62 %
PLATELETS: 197 10*3/uL (ref 145–400)
RBC: 4.54 MIL/uL (ref 3.70–5.45)
RDW: 13.3 % (ref 11.2–14.5)
WBC Count: 5 10*3/uL (ref 3.9–10.3)

## 2018-03-16 MED ORDER — FULVESTRANT 250 MG/5ML IM SOLN
500.0000 mg | Freq: Once | INTRAMUSCULAR | Status: AC
  Administered 2018-03-16: 500 mg via INTRAMUSCULAR

## 2018-03-16 MED ORDER — FULVESTRANT 250 MG/5ML IM SOLN
INTRAMUSCULAR | Status: AC
  Filled 2018-03-16: qty 5

## 2018-03-16 NOTE — Patient Instructions (Signed)

## 2018-04-09 ENCOUNTER — Other Ambulatory Visit: Payer: Self-pay | Admitting: *Deleted

## 2018-04-09 DIAGNOSIS — C50911 Malignant neoplasm of unspecified site of right female breast: Secondary | ICD-10-CM

## 2018-04-13 ENCOUNTER — Inpatient Hospital Stay: Payer: BLUE CROSS/BLUE SHIELD

## 2018-04-13 ENCOUNTER — Telehealth: Payer: Self-pay

## 2018-04-13 ENCOUNTER — Other Ambulatory Visit: Payer: Self-pay | Admitting: *Deleted

## 2018-04-13 ENCOUNTER — Inpatient Hospital Stay: Payer: BLUE CROSS/BLUE SHIELD | Attending: Oncology

## 2018-04-13 ENCOUNTER — Telehealth: Payer: Self-pay | Admitting: *Deleted

## 2018-04-13 ENCOUNTER — Other Ambulatory Visit: Payer: Self-pay | Admitting: Oncology

## 2018-04-13 VITALS — BP 138/85 | HR 71 | Temp 98.4°F | Resp 18

## 2018-04-13 DIAGNOSIS — C50911 Malignant neoplasm of unspecified site of right female breast: Secondary | ICD-10-CM

## 2018-04-13 DIAGNOSIS — Z5111 Encounter for antineoplastic chemotherapy: Secondary | ICD-10-CM | POA: Diagnosis not present

## 2018-04-13 DIAGNOSIS — Z170 Estrogen receptor positive status [ER+]: Secondary | ICD-10-CM

## 2018-04-13 DIAGNOSIS — C50811 Malignant neoplasm of overlapping sites of right female breast: Secondary | ICD-10-CM

## 2018-04-13 DIAGNOSIS — C50411 Malignant neoplasm of upper-outer quadrant of right female breast: Secondary | ICD-10-CM | POA: Diagnosis not present

## 2018-04-13 LAB — CBC WITH DIFFERENTIAL (CANCER CENTER ONLY)
BASOS PCT: 1 %
Basophils Absolute: 0 10*3/uL (ref 0.0–0.1)
EOS ABS: 0.1 10*3/uL (ref 0.0–0.5)
Eosinophils Relative: 2 %
HEMATOCRIT: 41.4 % (ref 34.8–46.6)
HEMOGLOBIN: 13.9 g/dL (ref 11.6–15.9)
LYMPHS ABS: 1.5 10*3/uL (ref 0.9–3.3)
Lymphocytes Relative: 27 %
MCH: 29.5 pg (ref 25.1–34.0)
MCHC: 33.6 g/dL (ref 31.5–36.0)
MCV: 87.8 fL (ref 79.5–101.0)
MONO ABS: 0.3 10*3/uL (ref 0.1–0.9)
MONOS PCT: 6 %
NEUTROS PCT: 64 %
Neutro Abs: 3.6 10*3/uL (ref 1.5–6.5)
Platelet Count: 196 10*3/uL (ref 145–400)
RBC: 4.71 MIL/uL (ref 3.70–5.45)
RDW: 13.6 % (ref 11.2–14.5)
WBC Count: 5.6 10*3/uL (ref 3.9–10.3)

## 2018-04-13 LAB — CMP (CANCER CENTER ONLY)
ALT: 43 U/L (ref 0–44)
AST: 27 U/L (ref 15–41)
Albumin: 4.4 g/dL (ref 3.5–5.0)
Alkaline Phosphatase: 121 U/L (ref 38–126)
Anion gap: 9 (ref 5–15)
BILIRUBIN TOTAL: 0.3 mg/dL (ref 0.3–1.2)
BUN: 12 mg/dL (ref 8–23)
CALCIUM: 9.9 mg/dL (ref 8.9–10.3)
CHLORIDE: 107 mmol/L (ref 98–111)
CO2: 26 mmol/L (ref 22–32)
CREATININE: 0.75 mg/dL (ref 0.44–1.00)
GFR, Est AFR Am: >60 mL/min (ref >60)
GFR, Estimated: >60 mL/min (ref >60)
GLUCOSE: 84 mg/dL (ref 70–99)
Potassium: 4.3 mmol/L (ref 3.5–5.1)
Sodium: 142 mmol/L (ref 135–145)
Total Protein: 7.3 g/dL (ref 6.5–8.1)

## 2018-04-13 MED ORDER — FULVESTRANT 250 MG/5ML IM SOLN
500.0000 mg | Freq: Once | INTRAMUSCULAR | Status: AC
  Administered 2018-04-13: 500 mg via INTRAMUSCULAR

## 2018-04-13 MED ORDER — FULVESTRANT 250 MG/5ML IM SOLN
INTRAMUSCULAR | Status: AC
  Filled 2018-04-13: qty 5

## 2018-04-13 NOTE — Telephone Encounter (Signed)
Per MD request referral placed to Dr Vivien Presto at [facility] for transfer of care per pt is relocating to [facility].  Referral placed.  Phone for Dr Leory Plowman [phone] and fax [phone] - intact coordinator is Ivin Booty.  Requested referal and records including path and scans to be faxed to above.  This note will be sent to HIM for records to be sent for scheduling of an appointment.  Pt needs to be seen within the month for continuity of care for injections.

## 2018-04-13 NOTE — Progress Notes (Unsigned)
Syndrome will be moving to [place].  She does have an appointment here in October.  I am going to see if we can set her up to see Dr. Vivien Presto through the [facility] in [place], and if so then further follow-up would be through that office.

## 2018-04-13 NOTE — Telephone Encounter (Signed)
Dr. Jana Hakim requested referral to be made for oncologist in [place] d/t patient moving to [place].  Dr. Vivien Presto is the primary breast cancer doctor at [facility], per staff.  This nurse left message with Ivin Booty, referral specialist, to call back regarding setting up appointment.

## 2018-04-14 ENCOUNTER — Telehealth: Payer: Self-pay | Admitting: Oncology

## 2018-04-14 NOTE — Telephone Encounter (Signed)
Faxed records to [facility]

## 2018-04-17 ENCOUNTER — Telehealth: Payer: Self-pay

## 2018-04-17 NOTE — Telephone Encounter (Signed)
Returned [employer] call from [employer].  Ivin Booty, referral specialist, calling to inform Dr. Jana Hakim that referral has been in process and waiting for patient to call back to confirm appointment.  Will fax appointment information over once confirmed with patient.

## 2018-05-07 ENCOUNTER — Other Ambulatory Visit: Payer: Self-pay

## 2018-05-07 DIAGNOSIS — Z170 Estrogen receptor positive status [ER+]: Principal | ICD-10-CM

## 2018-05-07 DIAGNOSIS — C50811 Malignant neoplasm of overlapping sites of right female breast: Secondary | ICD-10-CM

## 2018-05-11 ENCOUNTER — Inpatient Hospital Stay: Payer: BLUE CROSS/BLUE SHIELD

## 2018-05-11 VITALS — BP 137/83 | HR 67 | Temp 98.5°F | Resp 16

## 2018-05-11 DIAGNOSIS — Z5111 Encounter for antineoplastic chemotherapy: Secondary | ICD-10-CM | POA: Diagnosis not present

## 2018-05-11 DIAGNOSIS — Z170 Estrogen receptor positive status [ER+]: Principal | ICD-10-CM

## 2018-05-11 DIAGNOSIS — C50911 Malignant neoplasm of unspecified site of right female breast: Secondary | ICD-10-CM

## 2018-05-11 DIAGNOSIS — C50411 Malignant neoplasm of upper-outer quadrant of right female breast: Secondary | ICD-10-CM | POA: Diagnosis not present

## 2018-05-11 DIAGNOSIS — C50811 Malignant neoplasm of overlapping sites of right female breast: Secondary | ICD-10-CM

## 2018-05-11 LAB — CBC WITH DIFFERENTIAL (CANCER CENTER ONLY)
Basophils Absolute: 0 10*3/uL (ref 0.0–0.1)
Basophils Relative: 1 %
Eosinophils Absolute: 0.2 10*3/uL (ref 0.0–0.5)
Eosinophils Relative: 4 %
HCT: 42.8 % (ref 34.8–46.6)
Hemoglobin: 13.9 g/dL (ref 11.6–15.9)
LYMPHS PCT: 31 %
Lymphs Abs: 1.9 10*3/uL (ref 0.9–3.3)
MCH: 29.3 pg (ref 25.1–34.0)
MCHC: 32.5 g/dL (ref 31.5–36.0)
MCV: 90.1 fL (ref 79.5–101.0)
Monocytes Absolute: 0.3 10*3/uL (ref 0.1–0.9)
Monocytes Relative: 6 %
Neutro Abs: 3.6 10*3/uL (ref 1.5–6.5)
Neutrophils Relative %: 58 %
Platelet Count: 197 10*3/uL (ref 145–400)
RBC: 4.75 MIL/uL (ref 3.70–5.45)
RDW: 13.4 % (ref 11.2–14.5)
WBC: 6.1 10*3/uL (ref 3.9–10.3)

## 2018-05-11 LAB — CMP (CANCER CENTER ONLY)
ALT: 29 U/L (ref 0–44)
AST: 23 U/L (ref 15–41)
Albumin: 4.2 g/dL (ref 3.5–5.0)
Alkaline Phosphatase: 107 U/L (ref 38–126)
Anion gap: 9 (ref 5–15)
BUN: 15 mg/dL (ref 8–23)
CHLORIDE: 107 mmol/L (ref 98–111)
CO2: 24 mmol/L (ref 22–32)
CREATININE: 0.76 mg/dL (ref 0.44–1.00)
Calcium: 9.4 mg/dL (ref 8.9–10.3)
GFR, Est AFR Am: >60 mL/min (ref >60)
Glucose, Bld: 83 mg/dL (ref 70–99)
Potassium: 4.3 mmol/L (ref 3.5–5.1)
Sodium: 140 mmol/L (ref 135–145)
Total Bilirubin: 0.3 mg/dL (ref 0.3–1.2)
Total Protein: 7.1 g/dL (ref 6.5–8.1)

## 2018-05-11 MED ORDER — FULVESTRANT 250 MG/5ML IM SOLN
500.0000 mg | Freq: Once | INTRAMUSCULAR | Status: AC
  Administered 2018-05-11: 500 mg via INTRAMUSCULAR

## 2018-05-11 MED ORDER — FULVESTRANT 250 MG/5ML IM SOLN
INTRAMUSCULAR | Status: AC
  Filled 2018-05-11: qty 5

## 2018-05-11 NOTE — Patient Instructions (Signed)

## 2018-05-13 DIAGNOSIS — Z0000 Encounter for general adult medical examination without abnormal findings: Secondary | ICD-10-CM | POA: Diagnosis not present

## 2018-05-14 DIAGNOSIS — M810 Age-related osteoporosis without current pathological fracture: Secondary | ICD-10-CM | POA: Diagnosis not present

## 2018-06-01 DIAGNOSIS — C3411 Malignant neoplasm of upper lobe, right bronchus or lung: Secondary | ICD-10-CM | POA: Diagnosis not present

## 2018-06-01 DIAGNOSIS — C50911 Malignant neoplasm of unspecified site of right female breast: Secondary | ICD-10-CM | POA: Diagnosis not present

## 2018-06-08 ENCOUNTER — Inpatient Hospital Stay: Payer: BLUE CROSS/BLUE SHIELD

## 2018-06-08 ENCOUNTER — Inpatient Hospital Stay: Payer: BLUE CROSS/BLUE SHIELD | Attending: Oncology

## 2018-06-08 DIAGNOSIS — Z5111 Encounter for antineoplastic chemotherapy: Secondary | ICD-10-CM | POA: Insufficient documentation

## 2018-06-08 DIAGNOSIS — C50411 Malignant neoplasm of upper-outer quadrant of right female breast: Secondary | ICD-10-CM | POA: Diagnosis not present

## 2018-06-08 DIAGNOSIS — Z170 Estrogen receptor positive status [ER+]: Secondary | ICD-10-CM

## 2018-06-08 DIAGNOSIS — C50911 Malignant neoplasm of unspecified site of right female breast: Secondary | ICD-10-CM

## 2018-06-08 DIAGNOSIS — C50811 Malignant neoplasm of overlapping sites of right female breast: Secondary | ICD-10-CM

## 2018-06-08 LAB — CMP (CANCER CENTER ONLY)
ALBUMIN: 4.3 g/dL (ref 3.5–5.0)
ALK PHOS: 120 U/L (ref 38–126)
ALT: 39 U/L (ref 0–44)
AST: 29 U/L (ref 15–41)
Anion gap: 9 (ref 5–15)
BILIRUBIN TOTAL: 0.4 mg/dL (ref 0.3–1.2)
BUN: 18 mg/dL (ref 8–23)
CALCIUM: 9.6 mg/dL (ref 8.9–10.3)
CO2: 25 mmol/L (ref 22–32)
CREATININE: 0.76 mg/dL (ref 0.44–1.00)
Chloride: 106 mmol/L (ref 98–111)
GFR, Estimated: >60 mL/min (ref >60)
GLUCOSE: 83 mg/dL (ref 70–99)
Potassium: 4.3 mmol/L (ref 3.5–5.1)
SODIUM: 140 mmol/L (ref 135–145)
Total Protein: 7.5 g/dL (ref 6.5–8.1)

## 2018-06-08 LAB — CBC WITH DIFFERENTIAL (CANCER CENTER ONLY)
BASOS PCT: 1 %
Basophils Absolute: 0 10*3/uL (ref 0.0–0.1)
Eosinophils Absolute: 0.1 10*3/uL (ref 0.0–0.5)
Eosinophils Relative: 3 %
HCT: 41.5 % (ref 34.8–46.6)
Hemoglobin: 13.9 g/dL (ref 11.6–15.9)
Lymphocytes Relative: 29 %
Lymphs Abs: 1.6 10*3/uL (ref 0.9–3.3)
MCH: 29.1 pg (ref 25.1–34.0)
MCHC: 33.4 g/dL (ref 31.5–36.0)
MCV: 87.3 fL (ref 79.5–101.0)
MONO ABS: 0.3 10*3/uL (ref 0.1–0.9)
MONOS PCT: 6 %
Neutro Abs: 3.3 10*3/uL (ref 1.5–6.5)
Neutrophils Relative %: 61 %
Platelet Count: 194 10*3/uL (ref 145–400)
RBC: 4.76 MIL/uL (ref 3.70–5.45)
RDW: 13.6 % (ref 11.2–14.5)
WBC Count: 5.4 10*3/uL (ref 3.9–10.3)

## 2018-06-08 MED ORDER — FULVESTRANT 250 MG/5ML IM SOLN
INTRAMUSCULAR | Status: AC
  Filled 2018-06-08: qty 10

## 2018-06-08 MED ORDER — FULVESTRANT 250 MG/5ML IM SOLN
500.0000 mg | Freq: Once | INTRAMUSCULAR | Status: AC
  Administered 2018-06-08: 500 mg via INTRAMUSCULAR

## 2018-06-08 NOTE — Patient Instructions (Signed)

## 2018-06-12 DIAGNOSIS — C50911 Malignant neoplasm of unspecified site of right female breast: Secondary | ICD-10-CM | POA: Diagnosis not present

## 2018-06-25 DIAGNOSIS — C50911 Malignant neoplasm of unspecified site of right female breast: Secondary | ICD-10-CM | POA: Diagnosis not present

## 2018-06-29 DIAGNOSIS — C50911 Malignant neoplasm of unspecified site of right female breast: Secondary | ICD-10-CM | POA: Diagnosis not present

## 2018-06-30 DIAGNOSIS — C50911 Malignant neoplasm of unspecified site of right female breast: Secondary | ICD-10-CM | POA: Diagnosis not present

## 2018-07-02 DIAGNOSIS — C50911 Malignant neoplasm of unspecified site of right female breast: Secondary | ICD-10-CM | POA: Diagnosis not present

## 2018-07-06 ENCOUNTER — Inpatient Hospital Stay: Payer: BLUE CROSS/BLUE SHIELD

## 2018-07-06 ENCOUNTER — Inpatient Hospital Stay: Payer: BLUE CROSS/BLUE SHIELD | Attending: Oncology

## 2018-07-06 VITALS — BP 130/85 | HR 68 | Temp 98.2°F | Resp 17

## 2018-07-06 DIAGNOSIS — Z5111 Encounter for antineoplastic chemotherapy: Secondary | ICD-10-CM | POA: Diagnosis not present

## 2018-07-06 DIAGNOSIS — Z23 Encounter for immunization: Secondary | ICD-10-CM | POA: Diagnosis not present

## 2018-07-06 DIAGNOSIS — C50411 Malignant neoplasm of upper-outer quadrant of right female breast: Secondary | ICD-10-CM | POA: Diagnosis not present

## 2018-07-06 DIAGNOSIS — Z170 Estrogen receptor positive status [ER+]: Secondary | ICD-10-CM

## 2018-07-06 DIAGNOSIS — C50811 Malignant neoplasm of overlapping sites of right female breast: Secondary | ICD-10-CM

## 2018-07-06 DIAGNOSIS — C50911 Malignant neoplasm of unspecified site of right female breast: Secondary | ICD-10-CM

## 2018-07-06 LAB — CBC WITH DIFFERENTIAL (CANCER CENTER ONLY)
Basophils Absolute: 0 10*3/uL (ref 0.0–0.1)
Basophils Relative: 1 %
Eosinophils Absolute: 0 10*3/uL (ref 0.0–0.5)
Eosinophils Relative: 0 %
HEMATOCRIT: 39.4 % (ref 34.8–46.6)
HEMOGLOBIN: 12.7 g/dL (ref 11.6–15.9)
LYMPHS PCT: 29 %
Lymphs Abs: 1.8 10*3/uL (ref 0.9–3.3)
MCH: 29.1 pg (ref 25.1–34.0)
MCHC: 32.2 g/dL (ref 31.5–36.0)
MCV: 90.2 fL (ref 79.5–101.0)
Monocytes Absolute: 0.4 10*3/uL (ref 0.1–0.9)
Monocytes Relative: 7 %
NEUTROS ABS: 3.8 10*3/uL (ref 1.5–6.5)
Neutrophils Relative %: 63 %
PLATELETS: 209 10*3/uL (ref 145–400)
RBC: 4.37 MIL/uL (ref 3.70–5.45)
RDW: 13.4 % (ref 11.2–14.5)
WBC: 6.1 10*3/uL (ref 3.9–10.3)

## 2018-07-06 LAB — CMP (CANCER CENTER ONLY)
ALT: 31 U/L (ref 0–44)
ANION GAP: 10 (ref 5–15)
AST: 21 U/L (ref 15–41)
Albumin: 4 g/dL (ref 3.5–5.0)
Alkaline Phosphatase: 106 U/L (ref 38–126)
BILIRUBIN TOTAL: 0.3 mg/dL (ref 0.3–1.2)
BUN: 18 mg/dL (ref 8–23)
CO2: 22 mmol/L (ref 22–32)
Calcium: 9.6 mg/dL (ref 8.9–10.3)
Chloride: 108 mmol/L (ref 98–111)
Creatinine: 0.74 mg/dL (ref 0.44–1.00)
GFR, Est AFR Am: >60 mL/min (ref >60)
Glucose, Bld: 85 mg/dL (ref 70–99)
POTASSIUM: 4.3 mmol/L (ref 3.5–5.1)
Sodium: 140 mmol/L (ref 135–145)
TOTAL PROTEIN: 7.1 g/dL (ref 6.5–8.1)

## 2018-07-06 MED ORDER — FULVESTRANT 250 MG/5ML IM SOLN
INTRAMUSCULAR | Status: AC
Start: 2018-07-06 — End: ?
  Filled 2018-07-06: qty 5

## 2018-07-06 MED ORDER — FULVESTRANT 250 MG/5ML IM SOLN
500.0000 mg | Freq: Once | INTRAMUSCULAR | Status: AC
  Administered 2018-07-06: 500 mg via INTRAMUSCULAR

## 2018-07-06 NOTE — Patient Instructions (Signed)

## 2018-07-23 ENCOUNTER — Ambulatory Visit (HOSPITAL_COMMUNITY)
Admission: RE | Admit: 2018-07-23 | Discharge: 2018-07-23 | Disposition: A | Discharge status: Home / Self Care | Payer: BLUE CROSS/BLUE SHIELD | Source: Ambulatory Visit | Attending: Oncology | Admitting: Oncology

## 2018-07-23 DIAGNOSIS — C3490 Malignant neoplasm of unspecified part of unspecified bronchus or lung: Secondary | ICD-10-CM | POA: Diagnosis not present

## 2018-07-23 DIAGNOSIS — R918 Other nonspecific abnormal finding of lung field: Secondary | ICD-10-CM | POA: Insufficient documentation

## 2018-07-23 DIAGNOSIS — Z902 Acquired absence of lung [part of]: Secondary | ICD-10-CM | POA: Diagnosis not present

## 2018-07-23 DIAGNOSIS — C50919 Malignant neoplasm of unspecified site of unspecified female breast: Secondary | ICD-10-CM | POA: Diagnosis not present

## 2018-07-23 MED ORDER — SODIUM CHLORIDE 0.9 % IJ SOLN
INTRAMUSCULAR | Status: AC
  Filled 2018-07-23: qty 50

## 2018-07-23 MED ORDER — IOHEXOL 300 MG/ML  SOLN
75.0000 mL | Freq: Once | INTRAMUSCULAR | Status: AC | PRN
  Administered 2018-07-23: 75 mL via INTRAVENOUS

## 2018-08-03 ENCOUNTER — Encounter: Payer: Self-pay | Admitting: Oncology

## 2018-08-03 ENCOUNTER — Inpatient Hospital Stay: Payer: BLUE CROSS/BLUE SHIELD

## 2018-08-03 ENCOUNTER — Inpatient Hospital Stay: Payer: BLUE CROSS/BLUE SHIELD | Attending: Oncology

## 2018-08-03 ENCOUNTER — Telehealth: Payer: Self-pay | Admitting: Oncology

## 2018-08-03 ENCOUNTER — Inpatient Hospital Stay (HOSPITAL_BASED_OUTPATIENT_CLINIC_OR_DEPARTMENT_OTHER): Payer: BLUE CROSS/BLUE SHIELD | Admitting: Oncology

## 2018-08-03 VITALS — BP 130/74 | HR 68 | Temp 98.6°F | Resp 17 | Ht 66.0 in | Wt 133.5 lb

## 2018-08-03 DIAGNOSIS — Z170 Estrogen receptor positive status [ER+]: Principal | ICD-10-CM

## 2018-08-03 DIAGNOSIS — Z5111 Encounter for antineoplastic chemotherapy: Secondary | ICD-10-CM | POA: Insufficient documentation

## 2018-08-03 DIAGNOSIS — C50911 Malignant neoplasm of unspecified site of right female breast: Secondary | ICD-10-CM

## 2018-08-03 DIAGNOSIS — K769 Liver disease, unspecified: Secondary | ICD-10-CM | POA: Diagnosis not present

## 2018-08-03 DIAGNOSIS — C50411 Malignant neoplasm of upper-outer quadrant of right female breast: Secondary | ICD-10-CM | POA: Insufficient documentation

## 2018-08-03 DIAGNOSIS — R911 Solitary pulmonary nodule: Secondary | ICD-10-CM

## 2018-08-03 DIAGNOSIS — C50811 Malignant neoplasm of overlapping sites of right female breast: Secondary | ICD-10-CM

## 2018-08-03 DIAGNOSIS — M810 Age-related osteoporosis without current pathological fracture: Secondary | ICD-10-CM

## 2018-08-03 DIAGNOSIS — C3411 Malignant neoplasm of upper lobe, right bronchus or lung: Secondary | ICD-10-CM | POA: Insufficient documentation

## 2018-08-03 DIAGNOSIS — D229 Melanocytic nevi, unspecified: Secondary | ICD-10-CM

## 2018-08-03 DIAGNOSIS — C3410 Malignant neoplasm of upper lobe, unspecified bronchus or lung: Secondary | ICD-10-CM

## 2018-08-03 LAB — CMP (CANCER CENTER ONLY)
ALBUMIN: 4.1 g/dL (ref 3.5–5.0)
ALT: 34 U/L (ref 0–44)
ANION GAP: 12 (ref 5–15)
AST: 26 U/L (ref 15–41)
Alkaline Phosphatase: 111 U/L (ref 38–126)
BILIRUBIN TOTAL: 0.5 mg/dL (ref 0.3–1.2)
BUN: 12 mg/dL (ref 8–23)
CHLORIDE: 106 mmol/L (ref 98–111)
CO2: 23 mmol/L (ref 22–32)
Calcium: 9.9 mg/dL (ref 8.9–10.3)
Creatinine: 0.75 mg/dL (ref 0.44–1.00)
GFR, Est AFR Am: >60 mL/min (ref >60)
Glucose, Bld: 74 mg/dL (ref 70–99)
POTASSIUM: 4.3 mmol/L (ref 3.5–5.1)
Sodium: 141 mmol/L (ref 135–145)
TOTAL PROTEIN: 7.4 g/dL (ref 6.5–8.1)

## 2018-08-03 LAB — CBC WITH DIFFERENTIAL (CANCER CENTER ONLY)
Abs Immature Granulocytes: 0.02 10*3/uL (ref 0.00–0.07)
BASOS ABS: 0 10*3/uL (ref 0.0–0.1)
BASOS PCT: 1 %
EOS ABS: 0.1 10*3/uL (ref 0.0–0.5)
EOS PCT: 3 %
HEMATOCRIT: 40.9 % (ref 36.0–46.0)
Hemoglobin: 13.2 g/dL (ref 12.0–15.0)
IMMATURE GRANULOCYTES: 0 %
LYMPHS ABS: 1.7 10*3/uL (ref 0.7–4.0)
Lymphocytes Relative: 30 %
MCH: 28.9 pg (ref 26.0–34.0)
MCHC: 32.3 g/dL (ref 30.0–36.0)
MCV: 89.7 fL (ref 80.0–100.0)
Monocytes Absolute: 0.3 10*3/uL (ref 0.1–1.0)
Monocytes Relative: 6 %
NEUTROS PCT: 60 %
NRBC: 0 % (ref 0.0–0.2)
Neutro Abs: 3.5 10*3/uL (ref 1.7–7.7)
Platelet Count: 214 10*3/uL (ref 150–400)
RBC: 4.56 MIL/uL (ref 3.87–5.11)
RDW: 13.4 % (ref 11.5–15.5)
WBC Count: 5.7 10*3/uL (ref 4.0–10.5)

## 2018-08-03 MED ORDER — FULVESTRANT 250 MG/5ML IM SOLN
500.0000 mg | Freq: Once | INTRAMUSCULAR | Status: AC
  Administered 2018-08-03: 500 mg via INTRAMUSCULAR

## 2018-08-03 MED ORDER — FULVESTRANT 250 MG/5ML IM SOLN
INTRAMUSCULAR | Status: AC
  Filled 2018-08-03: qty 10

## 2018-08-03 NOTE — Progress Notes (Signed)
ID: Crystal [name]   DOB: [birth year]  MR#: [id number]  WUJ#:[id number]  PCP: [place] Carol, MD GYN: SU: [name] MD OTHER MD: Earle Gell, Daneen Schick, Arnoldo Hooker Thimmappa]  CHIEF COMPLAINT: recurrent estrogen receptor positive breast cancer in previously irradiated breast; pT1a lung cancer  CURRENT TREATMENT: Fulvestrant  BREAST CANCER HISTORY:   From the original intake note:  The patient had bilateral diagnostic mammography 02/12/2011.  She has a history of cystic changes noted on prior studies in April 2011 and April 2010.  On the Feb 12, 2011 study there were new calcifications identified superiorly in the right breast, without any obvious mass.  The patient was brought back for further studies, 02/19/2011 and Dr. Marcelo Baldy was able to localize the calcifications under stereotactic guidance.  The biopsy ([id number]) showed high-grade ductal carcinoma with papillary features which was 70% estrogen receptor positive, 0% progesterone receptor positive.  There was no definitive invasion and therefore further studies were not performed.   Bilateral breast MRIs were obtained Feb 26, 2011.  This showed marked nodular enhancement throughout both breasts, and in the upper central portion there was a post biopsy area of change measuring up to 3.7 cm.  Most of this is a fluid cavity with a thin rim of enhancement.  There were no suspicious areas in the left breast.  No internal mammary or axillary lymph nodes.  Of course, the patient has a history of hepatic adenomas and these were noted incidentally on the MRI.   Her definitive surgical treatment and subsequent history are as detailed below.  INTERVAL HISTORY: Crystal [name] returns today for follow-up and treatment of her estrogen receptor positive breast cancer, and also for follow-up of her lung cancer. For her breast cancer, she continues to receive fulvestrant monthly, with a dose due today. As far as the breast cancer goes she continues on  fulvestrant, with good tolerance. She notes that she used to feel very fatigued for 4-5 days after receiving the shot, but now she feels a little better.  Overall she feels that this is better than the aromatase inhibitors she tried before  She completed a chest CT on 07/14/2018 showing: Stable exam. Status post right upper lobectomy without new or progressive interval findings. Tiny bilateral pulmonary nodules are stable. No substantial change in hepatic lesions.  Patient reports recent bone density performed by Dr. Delfina Redwood. She states that it is worse and is interested in discussing pharmacologic treatments for osteoporosis  She reports not having a mammogram performed due to having the recent chest CT scan. Because she still has a left breast, she will schedule a mammogram  REVIEW OF SYSTEMS: Crystal [name] plans to move to [place] in the near future, but her house here has not sold yet. She walks daily, getting in 16k-18k steps per day. She continues on vitamin D. She denies unusual headaches, visual changes, nausea, vomiting, or dizziness. There has been no unusual cough, phlegm production, or pleurisy. There has been no change in bowel or bladder habits. She denies unexplained fatigue or unexplained weight loss, bleeding, rash, or fever. A detailed review of systems was otherwise stable.    PAST MEDICAL HISTORY: Past Medical History:  Diagnosis Date  . Anxiety   . Cancer ([place])    right breast  . Complication of anesthesia    hard to wake up  . GERD (gastroesophageal reflux disease)   . History of small bowel obstruction   . Insomnia   . Liver masses   history of small-bowel obstruction with  partial colectomy March 2001.  The patient has a history of hepatic adenomas.  She has had liver resections x2.  The [facility] for this is at [facility] and we will try to obtain it.  She has also had incisional hernia repair.  She has a history of osteopenia, and she has a history of multiple anti-red cell antibodies  making a transfusion difficult.  The patient tells me that she has a history of low calcium and a tendency to get liver injections.  PAST SURGICAL HISTORY: Past Surgical History:  Procedure Laterality Date  . BREAST SURGERY Right   . HERNIA REPAIR     incisional hernia repair x3  . laparotomies    . LIVER SURGERY    . LOBECTOMY Right 11/29/2015   Procedure: Completion Right Upper  LOBECTOMY, Lymph node dissection, on Q placement;  Surgeon: Grace Isaac, MD;  Location: [facility];  Service: Thoracic;  Laterality: Right;  . MASTECTOMY W/ SENTINEL NODE BIOPSY Right 07/05/2015   Procedure: RIGHT MASTECTOMY WITH SENTINEL NODE BIOPSY AND PORT A CATH INSERTION;  Surgeon: [name], MD;  Location: [facility];  Service: General;  Laterality: Right;  . PORTACATH PLACEMENT Left 07/05/2015   Procedure: INSERTION PORT-A-CATH;  Surgeon: [name], MD;  Location: [facility];  Service: General;  Laterality: Left;  Marland Kitchen VIDEO ASSISTED THORACOSCOPY (VATS)/WEDGE RESECTION Right 11/29/2015   Procedure: BRONCHOSCOPY , VIDEO ASSISTED THORACOSCOPY (VATS)/WEDGE RESECTION;  Surgeon: Grace Isaac, MD;  [facility];  Service: Thoracic;  Laterality: Right;    FAMILY HISTORY Family History  Problem Relation Age of Onset  . Ovarian cancer Mother 58       "metastasized to breasts"  . Breast cancer Maternal Grandmother 48       2-3 recurrences  . Stomach cancer Maternal Grandfather        dx. 47-49; metastatic stomach cancer  . Colon polyps Father        about 2-3 polyps removed at each colonoscopy  . Colon polyps Brother        "a few"  . Other Maternal Aunt        +TAH  . Colon cancer Paternal Aunt 90  . Brain cancer Daughter 17       astrocytoma  . Breast cancer Cousin        dx. 59s  The patient's mother died from ovarian cancer at the age of 15.  The patient's father is alive at age 43.  She has 2 brothers, no sisters.  A maternal grandmother was diagnosed  with breast cancer at the age of 70, and the maternal grandfather had stomach cancer at the age of 24.  GYNECOLOGIC HISTORY: The patient had menarche at age 84.  Her last menstrual period was in 2005.  She never used hormone replacement.  She used birth control briefly to control bleeding prior to 1980.  She was 62 years old at the birth of her first child.  She started having mammograms at age 58 because of a strong family history.  SOCIAL HISTORY: Crystal [name] works as a Programmer, applications for [employer].   She also heads the local "[employer]" Her husband, Sholonda Jobst, is a retired Engineer, maintenance from a [employer] locally.  Son, [name], lives in [place] and works for that [employer]  Daughter, [name], lives in [place] and is a homemaker.   ADVANCED DIRECTIVES: in place  HEALTH MAINTENANCE: Social History   Tobacco  Use  . Smoking status: Never Smoker  . Smokeless tobacco: Never Used  Substance Use Topics  . Alcohol use: Yes    Alcohol/week: 1.0 standard drinks    Types: 1 Glasses of wine per week    Comment: social  . Drug use: No     Colonoscopy: 2014  PAP:  Bone density:   Lipid panel:  Allergies  Allergen Reactions  . Promethazine Hcl Other (See Comments)    Causes shaking  . Tylenol [Acetaminophen] Other (See Comments)    Avoids Tylenol products due to liver disease   . Penicillins Rash    Current Outpatient Medications  Medication Sig Dispense Refill  . b complex vitamins tablet Take 1 tablet by mouth daily.      . calcium-vitamin D (OSCAL WITH D) 500-200 MG-UNIT per tablet Take 2 tablets by mouth daily.    . famotidine (PEPCID AC) 10 MG chewable tablet Chew 10 mg by mouth daily as needed for heartburn.    . fulvestrant (FASLODEX) 250 MG/5ML injection Inject 500 mg into the muscle every 30 (thirty) days. One injection each buttock over 1-2 minutes. Warm prior to use.    Marland Kitchen glucosamine-chondroitin 500-400 MG tablet Take 1  tablet by mouth 3 (three) times daily.      Marland Kitchen ibuprofen (ADVIL,MOTRIN) 200 MG tablet Take 200 mg by mouth every 6 (six) hours as needed for fever or mild pain.     . Multiple Vitamins-Minerals (MULTIVITAMIN WITH MINERALS) tablet Take 1 tablet by mouth daily.      . Omega-3 Fatty Acids (FISH OIL) 500 MG CAPS Take 1,000 mg by mouth daily.    Marland Kitchen triamcinolone (KENALOG) 0.025 % ointment Apply 1 application topically 2 (two) times daily. 30 g 0  . Turmeric 500 MG TABS Take 1,000 mg by mouth daily.     No current facility-administered medications for this visit.     OBJECTIVE: Middle-aged white woman who appears well  Vitals:   08/03/18 1006  BP: 130/74  Pulse: 68  Resp: 17  Temp: 98.6 F (37 C)  SpO2: 100%     Body mass index is 21.55 kg/m.    ECOG FS: 1  Sclerae unicteric, pupils round and equal No cervical or supraclavicular adenopathy Lungs no rales or rhonchi Heart regular rate and rhythm Abd soft, nontender, positive bowel sounds MSK no focal spinal tenderness, no upper extremity lymphedema Neuro: nonfocal, well oriented, appropriate affect Breasts: There is post right mastectomy.  There is no evidence of chest wall recurrence.  The left breast is benign.  Both axillae are benign.  LAB RESULTS: Lab Results  Component Value Date   WBC 5.7 08/03/2018   NEUTROABS 3.5 08/03/2018   HGB 13.2 08/03/2018   HCT 40.9 08/03/2018   MCV 89.7 08/03/2018   PLT 214 08/03/2018      Chemistry      Component Value Date/Time   NA 140 07/06/2018 0825   NA 140 09/29/2017 0830   K 4.3 07/06/2018 0825   K 4.6 09/29/2017 0830   CL 108 07/06/2018 0825   CL 102 03/15/2013 0912   CO2 22 07/06/2018 0825   CO2 25 09/29/2017 0830   BUN 18 07/06/2018 0825   BUN 17.4 09/29/2017 0830   CREATININE 0.74 07/06/2018 0825   CREATININE 0.8 09/29/2017 0830      Component Value Date/Time   CALCIUM 9.6 07/06/2018 0825   CALCIUM 9.8 09/29/2017 0830   ALKPHOS 106 07/06/2018 0825   ALKPHOS 123  09/29/2017  0830   AST 21 07/06/2018 0825   AST 22 09/29/2017 0830   ALT 31 07/06/2018 0825   ALT 32 09/29/2017 0830   BILITOT 0.3 07/06/2018 0825   BILITOT 0.26 09/29/2017 0830       Lab Results  Component Value Date   LABCA2 28 10/20/2012    No components found for: [id number]  No results for input(s): INR in the last 168 hours.  Urinalysis    Component Value Date/Time   COLORURINE YELLOW 11/27/2015 1403    STUDIES: Ct Chest W Contrast  Result Date: 07/23/2018 CLINICAL DATA:  Recurrent breast cancer. EXAM: CT CHEST WITH CONTRAST TECHNIQUE: Multidetector CT imaging of the chest was performed during intravenous contrast administration. CONTRAST:  66m OMNIPAQUE IOHEXOL 300 MG/ML  SOLN COMPARISON:  09/25/2017 FINDINGS: Cardiovascular: The heart size is normal. No substantial pericardial effusion. No thoracic aortic aneurysm. Mediastinum/Nodes: Stable tiny left thyroid nodules. No mediastinal lymphadenopathy. There is no hilar lymphadenopathy. The esophagus has normal imaging features. There is no axillary lymphadenopathy. Lungs/Pleura: The central tracheobronchial airways are patent. Status post right upper lobectomy. Staple line noted parahilar right lung with mild volume loss in the right hemithorax, stable. No suspicious nodule or mass in the right lung. 3 mm left apical nodule is stable. The tiny posterior left upper lobe nodule (22/5) is unchanged. The third tiny nodule described previously in the subpleural posterior right lower lobe (118/5) is stable. No new pulmonary nodule or mass. No focal airspace consolidation. No pleural effusion. Upper Abdomen: Patient is status post right hepatectomy. Irregular lesion anterior left liver is poorly defined but appears similar to prior study measuring approximately 4.5 x 3.0 cm today compared to 5.4 x 3.7 cm previously. 2nd ill-defined lesion just anterior to the surgical clips is also similar in the interval. Musculoskeletal: No worrisome  lytic or sclerotic osseous abnormality. Status post right mastectomy. IMPRESSION: 1. Stable exam. Status post right upper lobectomy without new or progressive interval findings. 2. Tiny bilateral pulmonary nodules are stable. 3. No substantial change in hepatic lesions. Electronically Signed   By: EMisty StanleyM.D.   On: 07/23/2018 16:08    ASSESSMENT: 62y.o.  BRCA 1-2 and p-53 negative [place] woman status post right lumpectomy and sentinel lymph node sampling June 2012 for a T1a N0, Stage IA  invasive ductal carcinoma, grade 3, estrogen receptor 98% positive, progesterone receptor 97% positive, with an MIB-1 of 5% and no HER-2 amplification   (a) note that the initial Right breast biopsy showed DCIS w papillary architecture, estrogen receptor 70% positive, progesterone receptor negative  (1) completed radiation September 2012.   (2) on letrozole starting July 2012, discontinued August 2016 with recurrence  RECURRENT DISEASE: (3) right breast upper outer quadrant biopsy 05/23/2015 was positive for a clinical T1c N0, stage IA invasive ductal carcinoma, grade 2, estrogen receptor 60% positive with moderate staining intensity, progesterone receptor negative, with no HER-2 amplification and an MIB-1 of 90%.  (4) status post right simple mastectomy 07/05/2015 for a pT1c pN0, stage IA invasive ductal carcinoma, grade 3, with negative margins. 4 lymph nodes removed with mastectomy were benign.  (a) the patient is not planning on reconstruction  (5) began cyclophosphamide and docetaxel 07/26/2015, repeated every 21 days 4, final dose 09/26/2015  (6) fulvestrant started 10/23/2015  (7) genetics testing 07/20/2015 was normal and did not reveal a deleterious mutation in ATM, BARD1, BRCA1, BRCA2, BRIP1, CDH1, CHEK2, FANCC, MLH1, MSH2, MSH6, NBN, PALB2, PMS2, PTEN, RAD51C, RAD51D, TP53, and XRCC2. Additionally no  variant of uncertain significance were found.   LUNG CANCER: (8) RUL spiculated nodule  measuring 1.4 cm noted on staging CT scan 06/13/2015-- stable on repeat CT 07/25/15--moderately hot and slightly larger on PET 10/31/2015  (a) status post right upper lobectomy and lymph node dissection 11/29/2015 for a pT1a pN0, stage IA primary lung adenocarcinoma (estrogen, progesterone, and gross cystic disease fluid protein negative, strongly Napsin-A and TTF-1 positive), low-grade, with negative margins   (9) history of multiple hepatic adenomas  (a) not a candidate for tamoxifen  PLAN: Ahlia is now just over 3 years out from definitive surgery for her breast cancer with no evidence of disease recurrence.  This is very favorable.  She is now nearly 3 years out from definitive surgery for her lung cancer.  CT scan is also very favorable.  She continues on fulvestrant monthly.  The plan is to do that for a total of 5 years which will take [place] through December 2021.  We discussed her bone density issues.  We reviewed bisphosphonates and denosumab including the possible toxicity side effects and complications.  She understands that osteonecrosis of the jaw is rare in the prophylactic setting as opposed to in the setting of patients that have bone metastases the incidence of osteonecrosis of the jaw is rare.  It is a little bit more common in patients to have teeth extracted or implanted.  She is very interested in proceeding and after much discussion she will receive zolendronate when she returns here in 4 weeks for her next fulvestrant dose.  I reviewed her genetics results which were benign.  She does have some history of colon cancer in the family.  It would not be wrong for her to have her next mammogram sometime this year or next but she needs to establish herself with a gastroenterologist in [place] and discuss that further with him or her.  Once she moved to [place] it may be that all her treatments will be moved there and she will let us know how that goes with insurance.  I have  scheduled her for a left mammogram at [facility] 08/31/2018  Otherwise she knows to call for any other issues that may develop before the next visit here.  Edilson Vital, Virgie Dad, MD  08/03/18 10:30 AM [facility] [address] [place], [place] [address] Tel. [phone]    Fax. [phone]  IWilburn Mylar, am acting as scribe for Chauncey Cruel MD.  I, Lurline Del MD, have reviewed the above documentation for accuracy and completeness, and I agree with the above.

## 2018-08-03 NOTE — Telephone Encounter (Signed)
Per 10/21 los.  Injections scheduled out due to patient moving.  Gave patient avs and calendar through feb.  Patient will get calendar for rest of appts on her next visit.

## 2018-08-03 NOTE — Telephone Encounter (Signed)
Faxed [facility] order for bone density.

## 2018-08-03 NOTE — Telephone Encounter (Signed)
Gave patient avs and calendar.   °

## 2018-08-06 ENCOUNTER — Other Ambulatory Visit: Payer: Self-pay | Admitting: Oncology

## 2018-08-31 ENCOUNTER — Telehealth: Payer: Self-pay | Admitting: Oncology

## 2018-08-31 ENCOUNTER — Inpatient Hospital Stay: Payer: BLUE CROSS/BLUE SHIELD

## 2018-08-31 ENCOUNTER — Inpatient Hospital Stay: Payer: BLUE CROSS/BLUE SHIELD | Attending: Oncology

## 2018-08-31 ENCOUNTER — Ambulatory Visit: Payer: BLUE CROSS/BLUE SHIELD

## 2018-08-31 ENCOUNTER — Telehealth: Payer: Self-pay | Admitting: *Deleted

## 2018-08-31 VITALS — BP 142/79 | HR 67 | Temp 97.8°F | Resp 17

## 2018-08-31 DIAGNOSIS — Z853 Personal history of malignant neoplasm of breast: Secondary | ICD-10-CM | POA: Diagnosis not present

## 2018-08-31 DIAGNOSIS — Z5111 Encounter for antineoplastic chemotherapy: Secondary | ICD-10-CM | POA: Diagnosis not present

## 2018-08-31 DIAGNOSIS — C50811 Malignant neoplasm of overlapping sites of right female breast: Secondary | ICD-10-CM

## 2018-08-31 DIAGNOSIS — Z170 Estrogen receptor positive status [ER+]: Secondary | ICD-10-CM

## 2018-08-31 DIAGNOSIS — Z1231 Encounter for screening mammogram for malignant neoplasm of breast: Secondary | ICD-10-CM | POA: Diagnosis not present

## 2018-08-31 DIAGNOSIS — C50411 Malignant neoplasm of upper-outer quadrant of right female breast: Secondary | ICD-10-CM | POA: Insufficient documentation

## 2018-08-31 DIAGNOSIS — C50911 Malignant neoplasm of unspecified site of right female breast: Secondary | ICD-10-CM

## 2018-08-31 LAB — CMP (CANCER CENTER ONLY)
ALBUMIN: 4.1 g/dL (ref 3.5–5.0)
ALK PHOS: 107 U/L (ref 38–126)
ALT: 30 U/L (ref 0–44)
ANION GAP: 12 (ref 5–15)
AST: 25 U/L (ref 15–41)
BUN: 14 mg/dL (ref 8–23)
CHLORIDE: 108 mmol/L (ref 98–111)
CO2: 22 mmol/L (ref 22–32)
Calcium: 9.6 mg/dL (ref 8.9–10.3)
Creatinine: 0.78 mg/dL (ref 0.44–1.00)
GFR, Estimated: >60 mL/min (ref >60)
GLUCOSE: 97 mg/dL (ref 70–99)
POTASSIUM: 4.2 mmol/L (ref 3.5–5.1)
SODIUM: 142 mmol/L (ref 135–145)
Total Bilirubin: 0.4 mg/dL (ref 0.3–1.2)
Total Protein: 7.5 g/dL (ref 6.5–8.1)

## 2018-08-31 LAB — CBC WITH DIFFERENTIAL (CANCER CENTER ONLY)
Abs Immature Granulocytes: 0.01 10*3/uL (ref 0.00–0.07)
BASOS ABS: 0 10*3/uL (ref 0.0–0.1)
Basophils Relative: 1 %
EOS ABS: 0 10*3/uL (ref 0.0–0.5)
Eosinophils Relative: 0 %
HEMATOCRIT: 42.8 % (ref 36.0–46.0)
Hemoglobin: 13.7 g/dL (ref 12.0–15.0)
IMMATURE GRANULOCYTES: 0 %
LYMPHS ABS: 1.8 10*3/uL (ref 0.7–4.0)
Lymphocytes Relative: 35 %
MCH: 28.4 pg (ref 26.0–34.0)
MCHC: 32 g/dL (ref 30.0–36.0)
MCV: 88.8 fL (ref 80.0–100.0)
MONOS PCT: 6 %
Monocytes Absolute: 0.3 10*3/uL (ref 0.1–1.0)
NEUTROS ABS: 3 10*3/uL (ref 1.7–7.7)
NEUTROS PCT: 58 %
PLATELETS: 202 10*3/uL (ref 150–400)
RBC: 4.82 MIL/uL (ref 3.87–5.11)
RDW: 12.6 % (ref 11.5–15.5)
WBC Count: 5.1 10*3/uL (ref 4.0–10.5)
nRBC: 0 % (ref 0.0–0.2)

## 2018-08-31 MED ORDER — FULVESTRANT 250 MG/5ML IM SOLN
INTRAMUSCULAR | Status: AC
  Filled 2018-08-31: qty 5

## 2018-08-31 MED ORDER — FULVESTRANT 250 MG/5ML IM SOLN
500.0000 mg | Freq: Once | INTRAMUSCULAR | Status: AC
  Administered 2018-08-31: 500 mg via INTRAMUSCULAR

## 2018-08-31 MED ORDER — ZOLEDRONIC ACID 4 MG/5ML IV CONC: 4.0000 mg | Freq: Once | INTRAVENOUS | Status: DC

## 2018-08-31 NOTE — Telephone Encounter (Signed)
Change patient time and date per Nurse and MD for 12/16

## 2018-08-31 NOTE — Progress Notes (Signed)
PA not started for Zometa. Made Dr. Jana Hakim aware.  He would like pt to be rescheduled to next week. I contacted Erline Levine, RN in inf and she will express our apologies to pt. Message sent to sched to reschedule. Dee (PA specialist) aware and is starting the PA request today. Kennith Center, Pharm.D., CPP 08/31/2018@9 :38 AM

## 2018-08-31 NOTE — Telephone Encounter (Signed)
Received call from Christian asking if pt can wait until 09/28/18 to get zometa.  OK per Dr Jana Hakim.

## 2018-08-31 NOTE — Patient Instructions (Signed)
[facility] Discharge Instructions for Patients Receiving Chemotherapy  Today you received the following chemotherapy agents Zometa and Faslodex.  To help prevent nausea and vomiting after your treatment, we encourage you to take your nausea medication    If you develop nausea and vomiting that is not controlled by your nausea medication, call the clinic.   BELOW ARE SYMPTOMS THAT SHOULD BE REPORTED IMMEDIATELY:  *FEVER GREATER THAN 100.5 F  *CHILLS WITH OR WITHOUT FEVER  NAUSEA AND VOMITING THAT IS NOT CONTROLLED WITH YOUR NAUSEA MEDICATION  *UNUSUAL SHORTNESS OF BREATH  *UNUSUAL BRUISING OR BLEEDING  TENDERNESS IN MOUTH AND THROAT WITH OR WITHOUT PRESENCE OF ULCERS  *URINARY PROBLEMS  *BOWEL PROBLEMS  UNUSUAL RASH Items with * indicate a potential emergency and should be followed up as soon as possible.  Feel free to call the clinic should you have any questions or concerns. The clinic phone number is [phone].  Please show the [facility] at check-in to the Emergency Department and triage nurse.

## 2018-09-07 ENCOUNTER — Ambulatory Visit: Payer: BLUE CROSS/BLUE SHIELD

## 2018-09-14 ENCOUNTER — Other Ambulatory Visit: Payer: Self-pay | Admitting: Oncology

## 2018-09-28 ENCOUNTER — Ambulatory Visit: Payer: BLUE CROSS/BLUE SHIELD

## 2018-09-28 ENCOUNTER — Inpatient Hospital Stay: Payer: BLUE CROSS/BLUE SHIELD

## 2018-09-28 ENCOUNTER — Other Ambulatory Visit: Payer: BLUE CROSS/BLUE SHIELD

## 2018-09-28 ENCOUNTER — Inpatient Hospital Stay: Payer: BLUE CROSS/BLUE SHIELD | Attending: Oncology

## 2018-09-28 VITALS — BP 134/72 | HR 64 | Temp 98.8°F | Resp 18 | Wt 131.5 lb

## 2018-09-28 DIAGNOSIS — M810 Age-related osteoporosis without current pathological fracture: Secondary | ICD-10-CM | POA: Diagnosis not present

## 2018-09-28 DIAGNOSIS — C50411 Malignant neoplasm of upper-outer quadrant of right female breast: Secondary | ICD-10-CM | POA: Insufficient documentation

## 2018-09-28 DIAGNOSIS — Z170 Estrogen receptor positive status [ER+]: Principal | ICD-10-CM

## 2018-09-28 DIAGNOSIS — Z5111 Encounter for antineoplastic chemotherapy: Secondary | ICD-10-CM | POA: Diagnosis not present

## 2018-09-28 DIAGNOSIS — C50911 Malignant neoplasm of unspecified site of right female breast: Secondary | ICD-10-CM | POA: Diagnosis not present

## 2018-09-28 DIAGNOSIS — C50811 Malignant neoplasm of overlapping sites of right female breast: Secondary | ICD-10-CM

## 2018-09-28 LAB — CBC WITH DIFFERENTIAL (CANCER CENTER ONLY)
ABS IMMATURE GRANULOCYTES: 0.02 10*3/uL (ref 0.00–0.07)
Basophils Absolute: 0 10*3/uL (ref 0.0–0.1)
Basophils Relative: 1 %
Eosinophils Absolute: 0.1 10*3/uL (ref 0.0–0.5)
Eosinophils Relative: 2 %
HCT: 39.6 % (ref 36.0–46.0)
Hemoglobin: 12.9 g/dL (ref 12.0–15.0)
Immature Granulocytes: 0 %
Lymphocytes Relative: 24 %
Lymphs Abs: 1.5 10*3/uL (ref 0.7–4.0)
MCH: 29.2 pg (ref 26.0–34.0)
MCHC: 32.6 g/dL (ref 30.0–36.0)
MCV: 89.6 fL (ref 80.0–100.0)
MONO ABS: 0.4 10*3/uL (ref 0.1–1.0)
MONOS PCT: 6 %
NEUTROS ABS: 4 10*3/uL (ref 1.7–7.7)
NEUTROS PCT: 67 %
Platelet Count: 207 10*3/uL (ref 150–400)
RBC: 4.42 MIL/uL (ref 3.87–5.11)
RDW: 12.7 % (ref 11.5–15.5)
WBC Count: 6.1 10*3/uL (ref 4.0–10.5)
nRBC: 0 % (ref 0.0–0.2)

## 2018-09-28 LAB — CMP (CANCER CENTER ONLY)
ALT: 29 U/L (ref 0–44)
ANION GAP: 11 (ref 5–15)
AST: 23 U/L (ref 15–41)
Albumin: 4 g/dL (ref 3.5–5.0)
Alkaline Phosphatase: 97 U/L (ref 38–126)
BUN: 12 mg/dL (ref 8–23)
CO2: 23 mmol/L (ref 22–32)
Calcium: 9.5 mg/dL (ref 8.9–10.3)
Chloride: 107 mmol/L (ref 98–111)
Creatinine: 0.72 mg/dL (ref 0.44–1.00)
GFR, Est AFR Am: >60 mL/min (ref >60)
GFR, Estimated: >60 mL/min (ref >60)
GLUCOSE: 83 mg/dL (ref 70–99)
Potassium: 4.3 mmol/L (ref 3.5–5.1)
Sodium: 141 mmol/L (ref 135–145)
Total Bilirubin: 0.5 mg/dL (ref 0.3–1.2)
Total Protein: 6.9 g/dL (ref 6.5–8.1)

## 2018-09-28 MED ORDER — SODIUM CHLORIDE 0.9 % IV SOLN
INTRAVENOUS | Status: DC
  Administered 2018-09-28: 10:00:00 via INTRAVENOUS
  Filled 2018-09-28: qty 250

## 2018-09-28 MED ORDER — FULVESTRANT 250 MG/5ML IM SOLN
INTRAMUSCULAR | Status: AC
  Filled 2018-09-28: qty 5

## 2018-09-28 MED ORDER — ZOLEDRONIC ACID 4 MG/100ML IV SOLN
4.0000 mg | Freq: Once | INTRAVENOUS | Status: AC
  Administered 2018-09-28: 4 mg via INTRAVENOUS
  Filled 2018-09-28: qty 100

## 2018-09-28 MED ORDER — FULVESTRANT 250 MG/5ML IM SOLN
500.0000 mg | Freq: Once | INTRAMUSCULAR | Status: AC
  Administered 2018-09-28: 500 mg via INTRAMUSCULAR

## 2018-09-28 NOTE — Patient Instructions (Signed)
Zoledronic Acid injection (Hypercalcemia, Oncology) What is this medicine? ZOLEDRONIC ACID (ZOE le dron ik AS id) lowers the amount of calcium loss from bone. It is used to treat too much calcium in your blood from cancer. It is also used to prevent complications of cancer that has spread to the bone. This medicine may be used for other purposes; ask your health care provider or pharmacist if you have questions. COMMON BRAND NAME(S): Zometa What should I tell my health care provider before I take this medicine? They need to know if you have any of these conditions: -aspirin-sensitive asthma -cancer, especially if you are receiving medicines used to treat cancer -dental disease or wear dentures -infection -kidney disease -receiving corticosteroids like dexamethasone or prednisone -an unusual or allergic reaction to zoledronic acid, other medicines, foods, dyes, or preservatives -pregnant or trying to get pregnant -breast-feeding How should I use this medicine? This medicine is for infusion into a vein. It is given by a health care professional in a hospital or clinic setting. Talk to your pediatrician regarding the use of this medicine in children. Special care may be needed. Overdosage: If you think you have taken too much of this medicine contact a poison control center or emergency room at once. NOTE: This medicine is only for you. Do not share this medicine with others. What if I miss a dose? It is important not to miss your dose. Call your doctor or health care professional if you are unable to keep an appointment. What may interact with this medicine? -certain antibiotics given by injection -NSAIDs, medicines for pain and inflammation, like ibuprofen or naproxen -some diuretics like bumetanide, furosemide -teriparatide -thalidomide This list may not describe all possible interactions. Give your health care provider a list of all the medicines, herbs, non-prescription drugs, or  dietary supplements you use. Also tell them if you smoke, drink alcohol, or use illegal drugs. Some items may interact with your medicine. What should I watch for while using this medicine? Visit your doctor or health care professional for regular checkups. It may be some time before you see the benefit from this medicine. Do not stop taking your medicine unless your doctor tells you to. Your doctor may order blood tests or other tests to see how you are doing. Women should inform their doctor if they wish to become pregnant or think they might be pregnant. There is a potential for serious side effects to an unborn child. Talk to your health care professional or pharmacist for more information. You should make sure that you get enough calcium and vitamin D while you are taking this medicine. Discuss the foods you eat and the vitamins you take with your health care professional. Some people who take this medicine have severe bone, joint, and/or muscle pain. This medicine may also increase your risk for jaw problems or a broken thigh bone. Tell your doctor right away if you have severe pain in your jaw, bones, joints, or muscles. Tell your doctor if you have any pain that does not go away or that gets worse. Tell your dentist and dental surgeon that you are taking this medicine. You should not have major dental surgery while on this medicine. See your dentist to have a dental exam and fix any dental problems before starting this medicine. Take good care of your teeth while on this medicine. Make sure you see your dentist for regular follow-up appointments. What side effects may I notice from receiving this medicine? Side effects that   you should report to your doctor or health care professional as soon as possible: -allergic reactions like skin rash, itching or hives, swelling of the face, lips, or tongue -anxiety, confusion, or depression -breathing problems -changes in vision -eye pain -feeling faint or  lightheaded, falls -jaw pain, especially after dental work -mouth sores -muscle cramps, stiffness, or weakness -redness, blistering, peeling or loosening of the skin, including inside the mouth -trouble passing urine or change in the amount of urine Side effects that usually do not require medical attention (report to your doctor or health care professional if they continue or are bothersome): -bone, joint, or muscle pain -constipation -diarrhea -fever -hair loss -irritation at site where injected -loss of appetite -nausea, vomiting -stomach upset -trouble sleeping -trouble swallowing -weak or tired This list may not describe all possible side effects. Call your doctor for medical advice about side effects. You may report side effects to FDA at 1-800-FDA-1088. Where should I keep my medicine? This drug is given in a hospital or clinic and will not be stored at home. NOTE: This sheet is a summary. It may not cover all possible information. If you have questions about this medicine, talk to your doctor, pharmacist, or health care provider.  2018 Elsevier/Gold Standard (2014-02-26 14:19:39)  Fulvestrant injection What is this medicine? FULVESTRANT (ful VES trant) blocks the effects of estrogen. It is used to treat breast cancer. This medicine may be used for other purposes; ask your health care provider or pharmacist if you have questions. COMMON BRAND NAME(S): FASLODEX What should I tell my health care provider before I take this medicine? They need to know if you have any of these conditions: -bleeding problems -liver disease -low levels of platelets in the blood -an unusual or allergic reaction to fulvestrant, other medicines, foods, dyes, or preservatives -pregnant or trying to get pregnant -breast-feeding How should I use this medicine? This medicine is for injection into a muscle. It is usually given by a health care professional in a hospital or clinic setting. Talk to  your pediatrician regarding the use of this medicine in children. Special care may be needed. Overdosage: If you think you have taken too much of this medicine contact a poison control center or emergency room at once. NOTE: This medicine is only for you. Do not share this medicine with others. What if I miss a dose? It is important not to miss your dose. Call your doctor or health care professional if you are unable to keep an appointment. What may interact with this medicine? -medicines that treat or prevent blood clots like warfarin, enoxaparin, and dalteparin This list may not describe all possible interactions. Give your health care provider a list of all the medicines, herbs, non-prescription drugs, or dietary supplements you use. Also tell them if you smoke, drink alcohol, or use illegal drugs. Some items may interact with your medicine. What should I watch for while using this medicine? Your condition will be monitored carefully while you are receiving this medicine. You will need important blood work done while you are taking this medicine. Do not become pregnant while taking this medicine or for at least 1 year after stopping it. Women of child-bearing potential will need to have a negative pregnancy test before starting this medicine. Women should inform their doctor if they wish to become pregnant or think they might be pregnant. There is a potential for serious side effects to an unborn child. Men should inform their doctors if they wish to father  a child. This medicine may lower sperm counts. Talk to your health care professional or pharmacist for more information. Do not breast-feed an infant while taking this medicine or for 1 year after the last dose. What side effects may I notice from receiving this medicine? Side effects that you should report to your doctor or health care professional as soon as possible: -allergic reactions like skin rash, itching or hives, swelling of the face,  lips, or tongue -feeling faint or lightheaded, falls -pain, tingling, numbness, or weakness in the legs -signs and symptoms of infection like fever or chills; cough; flu-like symptoms; sore throat -vaginal bleeding Side effects that usually do not require medical attention (report to your doctor or health care professional if they continue or are bothersome): -aches, pains -constipation -diarrhea -headache -hot flashes -nausea, vomiting -pain at site where injected -stomach pain This list may not describe all possible side effects. Call your doctor for medical advice about side effects. You may report side effects to FDA at 1-800-FDA-1088. Where should I keep my medicine? This drug is given in a hospital or clinic and will not be stored at home. NOTE: This sheet is a summary. It may not cover all possible information. If you have questions about this medicine, talk to your doctor, pharmacist, or health care provider.  2018 Elsevier/Gold Standard (2015-04-28 11:03:55)

## 2018-09-29 DIAGNOSIS — C50911 Malignant neoplasm of unspecified site of right female breast: Secondary | ICD-10-CM | POA: Diagnosis not present

## 2018-09-30 DIAGNOSIS — C50911 Malignant neoplasm of unspecified site of right female breast: Secondary | ICD-10-CM | POA: Diagnosis not present

## 2018-10-01 DIAGNOSIS — C50911 Malignant neoplasm of unspecified site of right female breast: Secondary | ICD-10-CM | POA: Diagnosis not present

## 2018-10-05 DIAGNOSIS — C50911 Malignant neoplasm of unspecified site of right female breast: Secondary | ICD-10-CM | POA: Diagnosis not present

## 2018-10-06 DIAGNOSIS — C50911 Malignant neoplasm of unspecified site of right female breast: Secondary | ICD-10-CM | POA: Diagnosis not present

## 2018-10-26 ENCOUNTER — Inpatient Hospital Stay: Payer: BLUE CROSS/BLUE SHIELD | Attending: Oncology

## 2018-10-26 ENCOUNTER — Inpatient Hospital Stay: Payer: BLUE CROSS/BLUE SHIELD

## 2018-10-26 DIAGNOSIS — C50411 Malignant neoplasm of upper-outer quadrant of right female breast: Secondary | ICD-10-CM | POA: Diagnosis not present

## 2018-10-26 DIAGNOSIS — Z170 Estrogen receptor positive status [ER+]: Secondary | ICD-10-CM

## 2018-10-26 DIAGNOSIS — Z5111 Encounter for antineoplastic chemotherapy: Secondary | ICD-10-CM | POA: Diagnosis not present

## 2018-10-26 DIAGNOSIS — C50811 Malignant neoplasm of overlapping sites of right female breast: Secondary | ICD-10-CM

## 2018-10-26 DIAGNOSIS — C50911 Malignant neoplasm of unspecified site of right female breast: Secondary | ICD-10-CM

## 2018-10-26 LAB — CMP (CANCER CENTER ONLY)
ALK PHOS: 116 U/L (ref 38–126)
ALT: 35 U/L (ref 0–44)
AST: 27 U/L (ref 15–41)
Albumin: 4.1 g/dL (ref 3.5–5.0)
Anion gap: 9 (ref 5–15)
BILIRUBIN TOTAL: 0.3 mg/dL (ref 0.3–1.2)
BUN: 14 mg/dL (ref 8–23)
CALCIUM: 9.6 mg/dL (ref 8.9–10.3)
CO2: 23 mmol/L (ref 22–32)
Chloride: 108 mmol/L (ref 98–111)
Creatinine: 0.76 mg/dL (ref 0.44–1.00)
GFR, Est AFR Am: >60 mL/min (ref >60)
GFR, Estimated: >60 mL/min (ref >60)
Glucose, Bld: 100 mg/dL — ABNORMAL HIGH (ref 70–99)
Potassium: 4.5 mmol/L (ref 3.5–5.1)
Sodium: 140 mmol/L (ref 135–145)
Total Protein: 7.6 g/dL (ref 6.5–8.1)

## 2018-10-26 LAB — CBC WITH DIFFERENTIAL (CANCER CENTER ONLY)
Abs Immature Granulocytes: 0.01 10*3/uL (ref 0.00–0.07)
Basophils Absolute: 0.1 10*3/uL (ref 0.0–0.1)
Basophils Relative: 1 %
Eosinophils Absolute: 0.1 10*3/uL (ref 0.0–0.5)
Eosinophils Relative: 2 %
HCT: 44.5 % (ref 36.0–46.0)
HEMOGLOBIN: 14.4 g/dL (ref 12.0–15.0)
Immature Granulocytes: 0 %
Lymphocytes Relative: 29 %
Lymphs Abs: 1.7 10*3/uL (ref 0.7–4.0)
MCH: 29.3 pg (ref 26.0–34.0)
MCHC: 32.4 g/dL (ref 30.0–36.0)
MCV: 90.6 fL (ref 80.0–100.0)
MONOS PCT: 6 %
Monocytes Absolute: 0.4 10*3/uL (ref 0.1–1.0)
Neutro Abs: 3.8 10*3/uL (ref 1.7–7.7)
Neutrophils Relative %: 62 %
Platelet Count: 225 10*3/uL (ref 150–400)
RBC: 4.91 MIL/uL (ref 3.87–5.11)
RDW: 12.9 % (ref 11.5–15.5)
WBC Count: 6.1 10*3/uL (ref 4.0–10.5)
nRBC: 0 % (ref 0.0–0.2)

## 2018-10-26 MED ORDER — FULVESTRANT 250 MG/5ML IM SOLN
INTRAMUSCULAR | Status: AC
  Filled 2018-10-26: qty 5

## 2018-10-26 MED ORDER — FULVESTRANT 250 MG/5ML IM SOLN
500.0000 mg | Freq: Once | INTRAMUSCULAR | Status: AC
  Administered 2018-10-26: 500 mg via INTRAMUSCULAR

## 2018-10-26 NOTE — Patient Instructions (Signed)

## 2018-11-23 ENCOUNTER — Inpatient Hospital Stay: Payer: BLUE CROSS/BLUE SHIELD

## 2018-11-23 ENCOUNTER — Inpatient Hospital Stay: Payer: BLUE CROSS/BLUE SHIELD | Attending: Oncology

## 2018-11-23 DIAGNOSIS — C50811 Malignant neoplasm of overlapping sites of right female breast: Secondary | ICD-10-CM

## 2018-11-23 DIAGNOSIS — C50411 Malignant neoplasm of upper-outer quadrant of right female breast: Secondary | ICD-10-CM | POA: Insufficient documentation

## 2018-11-23 DIAGNOSIS — Z5111 Encounter for antineoplastic chemotherapy: Secondary | ICD-10-CM | POA: Insufficient documentation

## 2018-11-23 DIAGNOSIS — C50911 Malignant neoplasm of unspecified site of right female breast: Secondary | ICD-10-CM

## 2018-11-23 DIAGNOSIS — Z170 Estrogen receptor positive status [ER+]: Secondary | ICD-10-CM

## 2018-11-23 LAB — CBC WITH DIFFERENTIAL (CANCER CENTER ONLY)
Abs Immature Granulocytes: 0.01 10*3/uL (ref 0.00–0.07)
BASOS PCT: 1 %
Basophils Absolute: 0 10*3/uL (ref 0.0–0.1)
Eosinophils Absolute: 0 10*3/uL (ref 0.0–0.5)
Eosinophils Relative: 0 %
HCT: 41 % (ref 36.0–46.0)
Hemoglobin: 13.3 g/dL (ref 12.0–15.0)
IMMATURE GRANULOCYTES: 0 %
Lymphocytes Relative: 28 %
Lymphs Abs: 1.7 10*3/uL (ref 0.7–4.0)
MCH: 29.1 pg (ref 26.0–34.0)
MCHC: 32.4 g/dL (ref 30.0–36.0)
MCV: 89.7 fL (ref 80.0–100.0)
Monocytes Absolute: 0.4 10*3/uL (ref 0.1–1.0)
Monocytes Relative: 6 %
Neutro Abs: 4 10*3/uL (ref 1.7–7.7)
Neutrophils Relative %: 65 %
PLATELETS: 217 10*3/uL (ref 150–400)
RBC: 4.57 MIL/uL (ref 3.87–5.11)
RDW: 13.4 % (ref 11.5–15.5)
WBC Count: 6.1 10*3/uL (ref 4.0–10.5)
nRBC: 0 % (ref 0.0–0.2)

## 2018-11-23 LAB — CMP (CANCER CENTER ONLY)
ALT: 29 U/L (ref 0–44)
AST: 24 U/L (ref 15–41)
Albumin: 4.1 g/dL (ref 3.5–5.0)
Alkaline Phosphatase: 90 U/L (ref 38–126)
Anion gap: 8 (ref 5–15)
BUN: 11 mg/dL (ref 8–23)
CO2: 25 mmol/L (ref 22–32)
Calcium: 9.4 mg/dL (ref 8.9–10.3)
Chloride: 106 mmol/L (ref 98–111)
Creatinine: 0.75 mg/dL (ref 0.44–1.00)
GFR, Est AFR Am: >60 mL/min (ref >60)
GFR, Estimated: >60 mL/min (ref >60)
GLUCOSE: 80 mg/dL (ref 70–99)
Potassium: 4.3 mmol/L (ref 3.5–5.1)
SODIUM: 139 mmol/L (ref 135–145)
Total Bilirubin: 0.6 mg/dL (ref 0.3–1.2)
Total Protein: 7.1 g/dL (ref 6.5–8.1)

## 2018-11-23 MED ORDER — FULVESTRANT 250 MG/5ML IM SOLN
500.0000 mg | Freq: Once | INTRAMUSCULAR | Status: AC
  Administered 2018-11-23: 500 mg via INTRAMUSCULAR

## 2018-11-23 MED ORDER — FULVESTRANT 250 MG/5ML IM SOLN
INTRAMUSCULAR | Status: AC
  Filled 2018-11-23: qty 10

## 2018-11-23 NOTE — Patient Instructions (Signed)

## 2018-12-08 ENCOUNTER — Telehealth: Payer: Self-pay | Admitting: *Deleted

## 2018-12-21 ENCOUNTER — Inpatient Hospital Stay: Payer: BLUE CROSS/BLUE SHIELD

## 2018-12-27 NOTE — Progress Notes (Signed)
[place]  Telephone:([phone] Fax:(336) [phone]     ID: Crystal [name]   DOB: 1956/07/17  MR#: [id number]  JOI#:[id number]  Patient Care Team: Seward Carol, MD as PCP - General (Internal Medicine) Karess Harner, Virgie Dad, MD as Consulting Physician (Oncology) Aloha Gell, MD as Consulting Physician (Obstetrics and Gynecology) [name], MD as Consulting Physician (General Surgery) Garlan Fair, MD as Consulting Physician (Gastroenterology) Belva Crome, MD as Consulting Physician (Cardiology) OTHER MD: Arnoldo Hooker Thimmappa]   CHIEF COMPLAINT: recurrent estrogen receptor positive breast cancer in previously irradiated breast; pT1a lung cancer  CURRENT TREATMENT: Fulvestrant, zoledronate   BREAST CANCER HISTORY:  From the original intake note:  The patient had bilateral diagnostic mammography 02/12/2011.  She has a history of cystic changes noted on prior studies in April 2011 and April 2010.  On the Feb 12, 2011 study there were new calcifications identified superiorly in the right breast, without any obvious mass.  The patient was brought back for further studies, 02/19/2011 and Dr. Marcelo Baldy was able to localize the calcifications under stereotactic guidance.  The biopsy ([id number]) showed high-grade ductal carcinoma with papillary features which was 70% estrogen receptor positive, 0% progesterone receptor positive.  There was no definitive invasion and therefore further studies were not performed.   Bilateral breast MRIs were obtained Feb 26, 2011.  This showed marked nodular enhancement throughout both breasts, and in the upper central portion there was a post biopsy area of change measuring up to 3.7 cm.  Most of this is a fluid cavity with a thin rim of enhancement.  There were no suspicious areas in the left breast.  No internal mammary or axillary lymph nodes.  Of course, the patient has a history of hepatic adenomas and these were noted  incidentally on the MRI.   Her definitive surgical treatment and subsequent history are as detailed below.   INTERVAL HISTORY: Crystal [name] returns today for follow-up and treatment of her estrogen receptor positive breast cancer, and also for follow-up of her lung cancer.   She continues on monthly fulvestrant. She has some fatigue. She will have a few days of diarrhea following.  She also receives zoledronate every 6 months, with her first dose on 09/28/2018. She experienced significant bone pain and some febrile chills. She did not have any numbness or tingling around her mouth or other symptoms of hypocalcemia..   Since her last visit here, she underwent a unilateral left digital screening mammogram on 08/31/2018 showing Breast Density Category B. There is no mammographic evidence of malignancy.   Her insurance is changing April 1st and this office is not listed as in-network for treatment purposes; she will be able to go an office on [address]. in [facility], which is considered in-network. However, once she turns 53 in 2021, she will change insurances again and can resume her treatments here.    REVIEW OF SYSTEMS: Crystal [name] believes that she is having capsulitis in her feet, which she describes as a burning sensation; she has also noticed that her hands are going to sleep at night, usually three or four times per month, mostly on her left.  She recently moved to a new house that only has hardwood floors, and she has recently been wearing slippers in the house. She had some shooting pains back in 08/2018 or 09/2019 in her calves, but this has resolved. The patient denies unusual headaches, visual changes, nausea, vomiting, or dizziness. There has been no unusual cough, phlegm production, or  pleurisy. This been no change in bowel or bladder habits. The patient denies unexplained weight loss, bleeding, or rash. A detailed review of systems was otherwise noncontributory.    PAST MEDICAL HISTORY:  Past Medical History:  Diagnosis Date  . Anxiety   . Cancer ([place])    right breast  . Complication of anesthesia    hard to wake up  . GERD (gastroesophageal reflux disease)   . History of small bowel obstruction   . Insomnia   . Liver masses   history of small-bowel obstruction with partial colectomy March 2001.  The patient has a history of hepatic adenomas.  She has had liver resections x2.  The data for this is at [facility] and we will try to obtain it.  She has also had incisional hernia repair.  She has a history of osteopenia, and she has a history of multiple anti-red cell antibodies making a transfusion difficult.  The patient tells me that she has a history of low calcium and a tendency to get liver injections.   PAST SURGICAL HISTORY: Past Surgical History:  Procedure Laterality Date  . BREAST SURGERY Right   . HERNIA REPAIR     incisional hernia repair x3  . laparotomies    . LIVER SURGERY    . LOBECTOMY Right 11/29/2015   Procedure: Completion Right Upper  LOBECTOMY, Lymph node dissection, on Q placement;  Surgeon: Grace Isaac, MD;  Location: [place];  Service: Thoracic;  Laterality: Right;  . MASTECTOMY W/ SENTINEL NODE BIOPSY Right 07/05/2015   Procedure: RIGHT MASTECTOMY WITH SENTINEL NODE BIOPSY AND PORT A CATH INSERTION;  Surgeon: [name], MD;  Location: [place];  Service: General;  Laterality: Right;  . PORTACATH PLACEMENT Left 07/05/2015   Procedure: INSERTION PORT-A-CATH;  Surgeon: [name], MD;  Location: [place];  Service: General;  Laterality: Left;  Marland Kitchen VIDEO ASSISTED THORACOSCOPY (VATS)/WEDGE RESECTION Right 11/29/2015   Procedure: BRONCHOSCOPY , VIDEO ASSISTED THORACOSCOPY (VATS)/WEDGE RESECTION;  Surgeon: Grace Isaac, MD;  Location: [facility];  Service: Thoracic;  Laterality: Right;    FAMILY HISTORY: Family History  Problem Relation Age of Onset  . Ovarian cancer Mother 32       "metastasized to breasts"   . Breast cancer Maternal Grandmother 48       2-3 recurrences  . Stomach cancer Maternal Grandfather        dx. 47-49; metastatic stomach cancer  . Colon polyps Father        about 2-3 polyps removed at each colonoscopy  . Colon polyps Brother        "a few"  . Other Maternal Aunt        +TAH  . Colon cancer Paternal Aunt 90  . Brain cancer Daughter 17       astrocytoma  . Breast cancer Cousin        dx. 48s   The patient's mother died from ovarian cancer at the age of 12.  The patient's father is alive at age 69.  She has 2 brothers, no sisters.  A maternal grandmother was diagnosed with breast cancer at the age of 97, and the maternal grandfather had stomach cancer at the age of 34.   GYNECOLOGIC HISTORY: The patient had menarche at age 22.  Her last menstrual period was in 2005.  She never used hormone replacement.  She used birth control briefly to control bleeding prior to 1980.  She was 63 years old at  the birth of her first child.  She started having mammograms at age 85 because of a strong family history.   SOCIAL HISTORY: Maille works as a Programmer, applications for [employer].   She also heads the local "[employer]" Her husband, Yumiko Alkins, is a retired Engineer, maintenance from a [employer] locally.  Son, [name], lives in [place] and works for that [employer]  Daughter, [name], lives in [place] and is a homemaker.    ADVANCED DIRECTIVES: in place   HEALTH MAINTENANCE: Social History   Tobacco Use  . Smoking status: Never Smoker  . Smokeless tobacco: Never Used  Substance Use Topics  . Alcohol use: Yes    Alcohol/week: 1.0 standard drinks    Types: 1 Glasses of wine per week    Comment: social  . Drug use: No     Colonoscopy: 2014  PAP:  Bone density:   Lipid panel:  Allergies  Allergen Reactions  . Promethazine Hcl Other (See Comments)    Causes shaking  . Tylenol [Acetaminophen] Other (See Comments)    Avoids  Tylenol products due to liver disease   . Penicillins Rash    Current Outpatient Medications  Medication Sig Dispense Refill  . b complex vitamins tablet Take 1 tablet by mouth daily.      . calcium-vitamin D (OSCAL WITH D) 500-200 MG-UNIT per tablet Take 2 tablets by mouth daily.    . famotidine (PEPCID AC) 10 MG chewable tablet Chew 10 mg by mouth daily as needed for heartburn.    . fulvestrant (FASLODEX) 250 MG/5ML injection Inject 500 mg into the muscle every 30 (thirty) days. One injection each buttock over 1-2 minutes. Warm prior to use.    Marland Kitchen glucosamine-chondroitin 500-400 MG tablet Take 1 tablet by mouth 3 (three) times daily.      Marland Kitchen ibuprofen (ADVIL,MOTRIN) 200 MG tablet Take 200 mg by mouth every 6 (six) hours as needed for fever or mild pain.     . Multiple Vitamins-Minerals (MULTIVITAMIN WITH MINERALS) tablet Take 1 tablet by mouth daily.      . Omega-3 Fatty Acids (FISH OIL) 500 MG CAPS Take 1,000 mg by mouth daily.    Marland Kitchen triamcinolone (KENALOG) 0.025 % ointment Apply 1 application topically 2 (two) times daily. 30 g 0  . Turmeric 500 MG TABS Take 1,000 mg by mouth daily.     No current facility-administered medications for this visit.    Facility-Administered Medications Ordered in Other Visits  Medication Dose Route Frequency Provider Last Rate Last Dose  . fulvestrant (FASLODEX) injection 500 mg  500 mg Intramuscular Once Mallisa Alameda, Virgie Dad, MD        OBJECTIVE: Middle-aged white woman in no acute distress  Vitals:   12/28/18 1310  BP: 129/61  Pulse: 82  Resp: 18  Temp: 98.6 F (37 C)  SpO2: 100%     Body mass index is 21.06 kg/m.    ECOG FS: 1  Sclerae unicteric, pupils round and equal No cervical or supraclavicular adenopathy; wearing a respirator mask Lungs no rales or rhonchi Heart regular rate and rhythm Abd soft, nontender, positive bowel sounds MSK no focal spinal tenderness, no upper extremity lymphedema Neuro: nonfocal, well oriented, appropriate  affect Breasts: Deferred   LAB RESULTS: Lab Results  Component Value Date   WBC 7.0 12/28/2018   NEUTROABS 4.6 12/28/2018   HGB 14.3 12/28/2018   HCT 44.8 12/28/2018   MCV 91.1 12/28/2018  PLT 245 12/28/2018      Chemistry      Component Value Date/Time   NA 139 12/28/2018 1215   NA 140 09/29/2017 0830   K 4.2 12/28/2018 1215   K 4.6 09/29/2017 0830   CL 102 12/28/2018 1215   CL 102 03/15/2013 0912   CO2 26 12/28/2018 1215   CO2 25 09/29/2017 0830   BUN 15 12/28/2018 1215   BUN 17.4 09/29/2017 0830   CREATININE 0.80 12/28/2018 1215   CREATININE 0.8 09/29/2017 0830      Component Value Date/Time   CALCIUM 10.0 12/28/2018 1215   CALCIUM 9.8 09/29/2017 0830   ALKPHOS 92 12/28/2018 1215   ALKPHOS 123 09/29/2017 0830   AST 27 12/28/2018 1215   AST 22 09/29/2017 0830   ALT 37 12/28/2018 1215   ALT 32 09/29/2017 0830   BILITOT 0.4 12/28/2018 1215   BILITOT 0.26 09/29/2017 0830       Lab Results  Component Value Date   LABCA2 28 10/20/2012    No components found for: [id number]  No results for input(s): INR in the last 168 hours.  Urinalysis    Component Value Date/Time   COLORURINE YELLOW 11/27/2015 1403    STUDIES: No results found.  ASSESSMENT: 63 y.o.  BRCA 1-2 and p-53 negative Marc Morgans, [place] woman status post right lumpectomy and sentinel lymph node sampling June 2012 for a T1a N0, Stage IA  invasive ductal carcinoma, grade 3, estrogen receptor 98% positive, progesterone receptor 97% positive, with an MIB-1 of 5% and no HER-2 amplification   (a) note that the initial Right breast biopsy showed DCIS w papillary architecture, estrogen receptor 70% positive, progesterone receptor negative  (1) completed radiation September 2012.   (2) on letrozole starting July 2012, discontinued August 2016 with recurrence   RECURRENT DISEASE: (3) right breast upper outer quadrant biopsy 05/23/2015 was positive for a clinical T1c N0, stage IA invasive ductal  carcinoma, grade 2, estrogen receptor 60% positive with moderate staining intensity, progesterone receptor negative, with no HER-2 amplification and an MIB-1 of 90%.  (4) status post right simple mastectomy 07/05/2015 for a pT1c pN0, stage IA invasive ductal carcinoma, grade 3, with negative margins. 4 lymph nodes removed with mastectomy were benign.  (a) the patient is not planning on reconstruction  (5) began cyclophosphamide and docetaxel 07/26/2015, repeated every 21 days 4, final dose 09/26/2015  (6) fulvestrant started 10/23/2015  (7) genetics testing 07/20/2015 found no deleterious mutation in ATM, BARD1, BRCA1, BRCA2, BRIP1, CDH1, CHEK2, FANCC, MLH1, MSH2, MSH6, NBN, PALB2, PMS2, PTEN, RAD51C, RAD51D, TP53, and XRCC2. Additionally no variants of uncertain significance were found.   LUNG CANCER: (8) RUL spiculated nodule measuring 1.4 cm noted on staging CT scan 06/13/2015-- stable on repeat CT 07/25/15--moderately hot and slightly larger on PET 10/31/2015  (a) status post right upper lobectomy and lymph node dissection 11/29/2015 for a pT1a pN0, stage IA primary lung adenocarcinoma (estrogen, progesterone, and gross cystic disease fluid protein negative, strongly Napsin-A and TTF-1 positive), low-grade, with negative margins   (9) history of multiple hepatic adenomas  (a) not a candidate for tamoxifen  (10) osteopenia:  (a) bone density 03/14/2016 showed a T score of -2.1.  (b) zoledronate started December 2019   PLAN: Dajane is now 3-1/2 years out from definitive surgery for her breast cancer with no evidence of disease recurrence.  This is very favorable.  She is tolerating the fulvestrant without any unusual side effects and the plan is to continue that  for total of 5 years.  She was started on zoledronate in December.  She did more or less well with that.  I have suggested if she does get bony aches she can take ibuprofen and Tylenol together and that should be helpful.   She is developing some carpal tunnel symptoms and I have suggested she try wrist splints while sleeping.  For insurance reasons she needs to change her treatment venue to our [place] office.  After a few more months she will qualify for Medicare and she would like to continue to be followed here at that point.  I have put in the orders and alerted my partners in [place].  If there are any issues regarding this they will let me know.  Otherwise she will call with any issues that may develop before the next visit.     Lelon Ikard, Virgie Dad, MD  12/28/18 1:57 PM Medical Oncology and Hematology [facility] [address] [place], [place] [address] Tel. [phone]    Fax. [phone]  I, Jacqualyn Posey am acting as a Education administrator for Chauncey Cruel, MD.   I, Lurline Del MD, have reviewed the above documentation for accuracy and completeness, and I agree with the above.

## 2018-12-28 ENCOUNTER — Telehealth: Payer: Self-pay | Admitting: Oncology

## 2018-12-28 ENCOUNTER — Other Ambulatory Visit: Payer: Self-pay

## 2018-12-28 ENCOUNTER — Inpatient Hospital Stay: Payer: BLUE CROSS/BLUE SHIELD

## 2018-12-28 ENCOUNTER — Inpatient Hospital Stay: Payer: BLUE CROSS/BLUE SHIELD | Attending: Oncology | Admitting: Oncology

## 2018-12-28 VITALS — BP 129/61 | HR 82 | Temp 98.6°F | Resp 18 | Ht 66.0 in | Wt 130.5 lb

## 2018-12-28 DIAGNOSIS — C50411 Malignant neoplasm of upper-outer quadrant of right female breast: Secondary | ICD-10-CM | POA: Diagnosis not present

## 2018-12-28 DIAGNOSIS — C50811 Malignant neoplasm of overlapping sites of right female breast: Secondary | ICD-10-CM

## 2018-12-28 DIAGNOSIS — M8580 Other specified disorders of bone density and structure, unspecified site: Secondary | ICD-10-CM

## 2018-12-28 DIAGNOSIS — Z5111 Encounter for antineoplastic chemotherapy: Secondary | ICD-10-CM | POA: Diagnosis not present

## 2018-12-28 DIAGNOSIS — C3410 Malignant neoplasm of upper lobe, unspecified bronchus or lung: Secondary | ICD-10-CM

## 2018-12-28 DIAGNOSIS — Z170 Estrogen receptor positive status [ER+]: Principal | ICD-10-CM

## 2018-12-28 DIAGNOSIS — C50911 Malignant neoplasm of unspecified site of right female breast: Secondary | ICD-10-CM

## 2018-12-28 DIAGNOSIS — D229 Melanocytic nevi, unspecified: Secondary | ICD-10-CM

## 2018-12-28 DIAGNOSIS — C3411 Malignant neoplasm of upper lobe, right bronchus or lung: Secondary | ICD-10-CM | POA: Diagnosis not present

## 2018-12-28 LAB — CMP (CANCER CENTER ONLY)
ALBUMIN: 4.4 g/dL (ref 3.5–5.0)
ALT: 37 U/L (ref 0–44)
ANION GAP: 11 (ref 5–15)
AST: 27 U/L (ref 15–41)
Alkaline Phosphatase: 92 U/L (ref 38–126)
BUN: 15 mg/dL (ref 8–23)
CO2: 26 mmol/L (ref 22–32)
Calcium: 10 mg/dL (ref 8.9–10.3)
Chloride: 102 mmol/L (ref 98–111)
Creatinine: 0.8 mg/dL (ref 0.44–1.00)
GFR, Est AFR Am: >60 mL/min (ref >60)
GFR, Estimated: >60 mL/min (ref >60)
GLUCOSE: 102 mg/dL — AB (ref 70–99)
Potassium: 4.2 mmol/L (ref 3.5–5.1)
Sodium: 139 mmol/L (ref 135–145)
Total Bilirubin: 0.4 mg/dL (ref 0.3–1.2)
Total Protein: 8 g/dL (ref 6.5–8.1)

## 2018-12-28 LAB — CBC WITH DIFFERENTIAL (CANCER CENTER ONLY)
Abs Immature Granulocytes: 0.02 10*3/uL (ref 0.00–0.07)
Basophils Absolute: 0.1 10*3/uL (ref 0.0–0.1)
Basophils Relative: 1 %
EOS ABS: 0.2 10*3/uL (ref 0.0–0.5)
Eosinophils Relative: 2 %
HEMATOCRIT: 44.8 % (ref 36.0–46.0)
Hemoglobin: 14.3 g/dL (ref 12.0–15.0)
Immature Granulocytes: 0 %
LYMPHS ABS: 1.8 10*3/uL (ref 0.7–4.0)
LYMPHS PCT: 26 %
MCH: 29.1 pg (ref 26.0–34.0)
MCHC: 31.9 g/dL (ref 30.0–36.0)
MCV: 91.1 fL (ref 80.0–100.0)
Monocytes Absolute: 0.4 10*3/uL (ref 0.1–1.0)
Monocytes Relative: 5 %
Neutro Abs: 4.6 10*3/uL (ref 1.7–7.7)
Neutrophils Relative %: 66 %
Platelet Count: 245 10*3/uL (ref 150–400)
RBC: 4.92 MIL/uL (ref 3.87–5.11)
RDW: 13.2 % (ref 11.5–15.5)
WBC Count: 7 10*3/uL (ref 4.0–10.5)
nRBC: 0 % (ref 0.0–0.2)

## 2018-12-28 MED ORDER — FULVESTRANT 250 MG/5ML IM SOLN
INTRAMUSCULAR | Status: AC
  Filled 2018-12-28: qty 10

## 2018-12-28 MED ORDER — FULVESTRANT 250 MG/5ML IM SOLN
500.0000 mg | Freq: Once | INTRAMUSCULAR | Status: AC
  Administered 2018-12-28: 500 mg via INTRAMUSCULAR

## 2018-12-28 NOTE — Telephone Encounter (Signed)
Patient cancelled due to insurance

## 2019-01-18 ENCOUNTER — Ambulatory Visit: Payer: BLUE CROSS/BLUE SHIELD

## 2019-01-18 ENCOUNTER — Other Ambulatory Visit: Payer: BLUE CROSS/BLUE SHIELD

## 2019-01-19 ENCOUNTER — Telehealth: Payer: Self-pay | Admitting: Oncology

## 2019-01-19 NOTE — Telephone Encounter (Signed)
sw pt to confirm 4/13 appt at 1130 am at the [facility] per 4/7staff msg. Pt will get copy of future appt when in the office. Pt aware of date/time/location.

## 2019-01-25 ENCOUNTER — Inpatient Hospital Stay: Payer: BLUE CROSS/BLUE SHIELD

## 2019-01-25 ENCOUNTER — Inpatient Hospital Stay: Payer: BLUE CROSS/BLUE SHIELD | Attending: Oncology

## 2019-01-25 ENCOUNTER — Other Ambulatory Visit: Payer: Self-pay

## 2019-01-25 DIAGNOSIS — C50411 Malignant neoplasm of upper-outer quadrant of right female breast: Secondary | ICD-10-CM | POA: Insufficient documentation

## 2019-01-25 DIAGNOSIS — Z5111 Encounter for antineoplastic chemotherapy: Secondary | ICD-10-CM | POA: Insufficient documentation

## 2019-01-25 DIAGNOSIS — C50811 Malignant neoplasm of overlapping sites of right female breast: Secondary | ICD-10-CM

## 2019-01-25 DIAGNOSIS — Z170 Estrogen receptor positive status [ER+]: Secondary | ICD-10-CM

## 2019-01-25 LAB — CMP (CANCER CENTER ONLY)
ALT: 32 U/L (ref 0–44)
AST: 24 U/L (ref 15–41)
Albumin: 4.8 g/dL (ref 3.5–5.0)
Alkaline Phosphatase: 81 U/L (ref 38–126)
Anion gap: 8 (ref 5–15)
BUN: 16 mg/dL (ref 8–23)
CO2: 30 mmol/L (ref 22–32)
Calcium: 10.2 mg/dL (ref 8.9–10.3)
Chloride: 103 mmol/L (ref 98–111)
Creatinine: 0.79 mg/dL (ref 0.44–1.00)
GFR, Est AFR Am: >60 mL/min (ref >60)
GFR, Estimated: >60 mL/min (ref >60)
Glucose, Bld: 88 mg/dL (ref 70–99)
Potassium: 5.1 mmol/L (ref 3.5–5.1)
Sodium: 141 mmol/L (ref 135–145)
Total Bilirubin: 0.4 mg/dL (ref 0.3–1.2)
Total Protein: 7.6 g/dL (ref 6.5–8.1)

## 2019-01-25 LAB — CBC WITH DIFFERENTIAL (CANCER CENTER ONLY)
Abs Immature Granulocytes: 0.02 10*3/uL (ref 0.00–0.07)
Basophils Absolute: 0 10*3/uL (ref 0.0–0.1)
Basophils Relative: 1 %
Eosinophils Absolute: 0.2 10*3/uL (ref 0.0–0.5)
Eosinophils Relative: 2 %
HCT: 43 % (ref 36.0–46.0)
Hemoglobin: 13.8 g/dL (ref 12.0–15.0)
Immature Granulocytes: 0 %
Lymphocytes Relative: 31 %
Lymphs Abs: 2 10*3/uL (ref 0.7–4.0)
MCH: 29.4 pg (ref 26.0–34.0)
MCHC: 32.1 g/dL (ref 30.0–36.0)
MCV: 91.7 fL (ref 80.0–100.0)
Monocytes Absolute: 0.4 10*3/uL (ref 0.1–1.0)
Monocytes Relative: 6 %
Neutro Abs: 3.9 10*3/uL (ref 1.7–7.7)
Neutrophils Relative %: 60 %
Platelet Count: 236 10*3/uL (ref 150–400)
RBC: 4.69 MIL/uL (ref 3.87–5.11)
RDW: 13.5 % (ref 11.5–15.5)
WBC Count: 6.5 10*3/uL (ref 4.0–10.5)
nRBC: 0 % (ref 0.0–0.2)

## 2019-01-25 MED ORDER — FULVESTRANT 250 MG/5ML IM SOLN
INTRAMUSCULAR | Status: AC
  Filled 2019-01-25: qty 5

## 2019-01-28 ENCOUNTER — Inpatient Hospital Stay: Payer: BLUE CROSS/BLUE SHIELD

## 2019-01-28 ENCOUNTER — Other Ambulatory Visit: Payer: Self-pay

## 2019-01-28 VITALS — BP 135/69 | HR 66 | Temp 98.6°F | Resp 18

## 2019-01-28 DIAGNOSIS — C50911 Malignant neoplasm of unspecified site of right female breast: Secondary | ICD-10-CM

## 2019-01-28 DIAGNOSIS — Z170 Estrogen receptor positive status [ER+]: Secondary | ICD-10-CM

## 2019-01-28 DIAGNOSIS — C50411 Malignant neoplasm of upper-outer quadrant of right female breast: Secondary | ICD-10-CM | POA: Diagnosis not present

## 2019-01-28 DIAGNOSIS — Z5111 Encounter for antineoplastic chemotherapy: Secondary | ICD-10-CM | POA: Diagnosis not present

## 2019-01-28 DIAGNOSIS — C50811 Malignant neoplasm of overlapping sites of right female breast: Secondary | ICD-10-CM

## 2019-01-28 MED ORDER — FULVESTRANT 250 MG/5ML IM SOLN
500.0000 mg | Freq: Once | INTRAMUSCULAR | Status: AC
  Administered 2019-01-28: 500 mg via INTRAMUSCULAR

## 2019-01-28 NOTE — Patient Instructions (Signed)

## 2019-02-15 ENCOUNTER — Ambulatory Visit: Payer: BLUE CROSS/BLUE SHIELD

## 2019-02-15 ENCOUNTER — Other Ambulatory Visit: Payer: BLUE CROSS/BLUE SHIELD

## 2019-02-23 ENCOUNTER — Other Ambulatory Visit: Payer: Self-pay | Admitting: Oncology

## 2019-02-24 ENCOUNTER — Other Ambulatory Visit: Payer: Self-pay

## 2019-02-24 ENCOUNTER — Inpatient Hospital Stay: Payer: BLUE CROSS/BLUE SHIELD | Attending: Oncology

## 2019-02-24 ENCOUNTER — Inpatient Hospital Stay: Payer: BLUE CROSS/BLUE SHIELD

## 2019-02-24 ENCOUNTER — Other Ambulatory Visit: Payer: Self-pay | Admitting: *Deleted

## 2019-02-24 DIAGNOSIS — Z5111 Encounter for antineoplastic chemotherapy: Secondary | ICD-10-CM | POA: Diagnosis not present

## 2019-02-24 DIAGNOSIS — C50411 Malignant neoplasm of upper-outer quadrant of right female breast: Secondary | ICD-10-CM | POA: Diagnosis not present

## 2019-02-24 DIAGNOSIS — C50811 Malignant neoplasm of overlapping sites of right female breast: Secondary | ICD-10-CM

## 2019-02-24 DIAGNOSIS — C50911 Malignant neoplasm of unspecified site of right female breast: Secondary | ICD-10-CM

## 2019-02-24 MED ORDER — FULVESTRANT 250 MG/5ML IM SOLN
INTRAMUSCULAR | Status: AC
  Filled 2019-02-24: qty 5

## 2019-02-24 MED ORDER — FULVESTRANT 250 MG/5ML IM SOLN
500.0000 mg | Freq: Once | INTRAMUSCULAR | Status: AC
  Administered 2019-02-24: 500 mg via INTRAMUSCULAR

## 2019-02-24 NOTE — Progress Notes (Signed)
No need for labs today per order of Dr. Jana Hakim.

## 2019-02-24 NOTE — Patient Instructions (Signed)

## 2019-03-15 ENCOUNTER — Ambulatory Visit: Payer: BLUE CROSS/BLUE SHIELD

## 2019-03-15 ENCOUNTER — Other Ambulatory Visit: Payer: BLUE CROSS/BLUE SHIELD

## 2019-03-24 ENCOUNTER — Other Ambulatory Visit: Payer: Self-pay | Admitting: *Deleted

## 2019-03-24 ENCOUNTER — Inpatient Hospital Stay: Payer: BLUE CROSS/BLUE SHIELD | Attending: Oncology

## 2019-03-24 ENCOUNTER — Other Ambulatory Visit: Payer: Self-pay

## 2019-03-24 ENCOUNTER — Inpatient Hospital Stay: Payer: BLUE CROSS/BLUE SHIELD

## 2019-03-24 VITALS — BP 125/67 | HR 76 | Temp 98.6°F | Resp 16

## 2019-03-24 DIAGNOSIS — Z5111 Encounter for antineoplastic chemotherapy: Secondary | ICD-10-CM | POA: Insufficient documentation

## 2019-03-24 DIAGNOSIS — C50911 Malignant neoplasm of unspecified site of right female breast: Secondary | ICD-10-CM

## 2019-03-24 DIAGNOSIS — C50411 Malignant neoplasm of upper-outer quadrant of right female breast: Secondary | ICD-10-CM | POA: Insufficient documentation

## 2019-03-24 DIAGNOSIS — Z170 Estrogen receptor positive status [ER+]: Secondary | ICD-10-CM

## 2019-03-24 DIAGNOSIS — M8580 Other specified disorders of bone density and structure, unspecified site: Secondary | ICD-10-CM | POA: Insufficient documentation

## 2019-03-24 DIAGNOSIS — C50811 Malignant neoplasm of overlapping sites of right female breast: Secondary | ICD-10-CM

## 2019-03-24 LAB — CMP (CANCER CENTER ONLY)
ALT: 25 U/L (ref 0–44)
AST: 22 U/L (ref 15–41)
Albumin: 4.6 g/dL (ref 3.5–5.0)
Alkaline Phosphatase: 76 U/L (ref 38–126)
Anion gap: 8 (ref 5–15)
BUN: 19 mg/dL (ref 8–23)
CO2: 29 mmol/L (ref 22–32)
Calcium: 10 mg/dL (ref 8.9–10.3)
Chloride: 103 mmol/L (ref 98–111)
Creatinine: 0.77 mg/dL (ref 0.44–1.00)
GFR, Est AFR Am: >60 mL/min (ref >60)
GFR, Estimated: >60 mL/min (ref >60)
Glucose, Bld: 117 mg/dL — ABNORMAL HIGH (ref 70–99)
Potassium: 4.9 mmol/L (ref 3.5–5.1)
Sodium: 140 mmol/L (ref 135–145)
Total Bilirubin: 0.4 mg/dL (ref 0.3–1.2)
Total Protein: 7.4 g/dL (ref 6.5–8.1)

## 2019-03-24 LAB — CBC WITH DIFFERENTIAL (CANCER CENTER ONLY)
Abs Immature Granulocytes: 0.01 10*3/uL (ref 0.00–0.07)
Basophils Absolute: 0 10*3/uL (ref 0.0–0.1)
Basophils Relative: 1 %
Eosinophils Absolute: 0 10*3/uL (ref 0.0–0.5)
Eosinophils Relative: 0 %
HCT: 43.2 % (ref 36.0–46.0)
Hemoglobin: 14 g/dL (ref 12.0–15.0)
Immature Granulocytes: 0 %
Lymphocytes Relative: 25 %
Lymphs Abs: 1.6 10*3/uL (ref 0.7–4.0)
MCH: 29.5 pg (ref 26.0–34.0)
MCHC: 32.4 g/dL (ref 30.0–36.0)
MCV: 91.1 fL (ref 80.0–100.0)
Monocytes Absolute: 0.3 10*3/uL (ref 0.1–1.0)
Monocytes Relative: 5 %
Neutro Abs: 4.5 10*3/uL (ref 1.7–7.7)
Neutrophils Relative %: 69 %
Platelet Count: 221 10*3/uL (ref 150–400)
RBC: 4.74 MIL/uL (ref 3.87–5.11)
RDW: 12.9 % (ref 11.5–15.5)
WBC Count: 6.6 10*3/uL (ref 4.0–10.5)
nRBC: 0 % (ref 0.0–0.2)

## 2019-03-24 MED ORDER — ZOLEDRONIC ACID 4 MG/100ML IV SOLN
4.0000 mg | Freq: Once | INTRAVENOUS | Status: AC
  Administered 2019-03-24: 4 mg via INTRAVENOUS
  Filled 2019-03-24: qty 100

## 2019-03-24 MED ORDER — FULVESTRANT 250 MG/5ML IM SOLN
500.0000 mg | Freq: Once | INTRAMUSCULAR | Status: AC
  Administered 2019-03-24: 500 mg via INTRAMUSCULAR

## 2019-03-24 MED ORDER — FULVESTRANT 250 MG/5ML IM SOLN
INTRAMUSCULAR | Status: AC
  Filled 2019-03-24: qty 5

## 2019-03-24 MED ORDER — SODIUM CHLORIDE 0.9 % IV SOLN
Freq: Once | INTRAVENOUS | Status: AC
  Administered 2019-03-24: 12:00:00 via INTRAVENOUS
  Filled 2019-03-24: qty 250

## 2019-03-24 NOTE — Patient Instructions (Signed)
Fulvestrant injection What is this medicine? FULVESTRANT (ful VES trant) blocks the effects of estrogen. It is used to treat breast cancer. This medicine may be used for other purposes; ask your health care provider or pharmacist if you have questions. COMMON BRAND NAME(S): FASLODEX What should I tell my health care provider before I take this medicine? They need to know if you have any of these conditions: -bleeding disorders -liver disease -low blood counts, like low white cell, platelet, or red cell counts -an unusual or allergic reaction to fulvestrant, other medicines, foods, dyes, or preservatives -pregnant or trying to get pregnant -breast-feeding How should I use this medicine? This medicine is for injection into a muscle. It is usually given by a health care professional in a hospital or clinic setting. Talk to your pediatrician regarding the use of this medicine in children. Special care may be needed. Overdosage: If you think you have taken too much of this medicine contact a poison control center or emergency room at once. NOTE: This medicine is only for you. Do not share this medicine with others. What if I miss a dose? It is important not to miss your dose. Call your doctor or health care professional if you are unable to keep an appointment. What may interact with this medicine? -medicines that treat or prevent blood clots like warfarin, enoxaparin, dalteparin, apixaban, dabigatran, and rivaroxaban This list may not describe all possible interactions. Give your health care provider a list of all the medicines, herbs, non-prescription drugs, or dietary supplements you use. Also tell them if you smoke, drink alcohol, or use illegal drugs. Some items may interact with your medicine. What should I watch for while using this medicine? Your condition will be monitored carefully while you are receiving this medicine. You will need important blood work done while you are taking this  medicine. Do not become pregnant while taking this medicine or for at least 1 year after stopping it. Women of child-bearing potential will need to have a negative pregnancy test before starting this medicine. Women should inform their doctor if they wish to become pregnant or think they might be pregnant. There is a potential for serious side effects to an unborn child. Men should inform their doctors if they wish to father a child. This medicine may lower sperm counts. Talk to your health care professional or pharmacist for more information. Do not breast-feed an infant while taking this medicine or for 1 year after the last dose. What side effects may I notice from receiving this medicine? Side effects that you should report to your doctor or health care professional as soon as possible: -allergic reactions like skin rash, itching or hives, swelling of the face, lips, or tongue -feeling faint or lightheaded, falls -pain, tingling, numbness, or weakness in the legs -signs and symptoms of infection like fever or chills; cough; flu-like symptoms; sore throat -vaginal bleeding Side effects that usually do not require medical attention (report to your doctor or health care professional if they continue or are bothersome): -aches, pains -constipation -diarrhea -headache -hot flashes -nausea, vomiting -pain at site where injected -stomach pain This list may not describe all possible side effects. Call your doctor for medical advice about side effects. You may report side effects to FDA at 1-800-FDA-1088. Where should I keep my medicine? This drug is given in a hospital or clinic and will not be stored at home. NOTE: This sheet is a summary. It may not cover all possible information. If you  have questions about this medicine, talk to your doctor, pharmacist, or health care provider.  2019 [employer]/Gold Standard (2018-01-08 11:34:41) Zoledronic Acid injection (Hypercalcemia, Oncology) What is this  medicine? ZOLEDRONIC ACID (ZOE le dron ik AS id) lowers the amount of calcium loss from bone. It is used to treat too much calcium in your blood from cancer. It is also used to prevent complications of cancer that has spread to the bone. This medicine may be used for other purposes; ask your health care provider or pharmacist if you have questions. COMMON BRAND NAME(S): Zometa What should I tell my health care provider before I take this medicine? They need to know if you have any of these conditions: -aspirin-sensitive asthma -cancer, especially if you are receiving medicines used to treat cancer -dental disease or wear dentures -infection -kidney disease -receiving corticosteroids like dexamethasone or prednisone -an unusual or allergic reaction to zoledronic acid, other medicines, foods, dyes, or preservatives -pregnant or trying to get pregnant -breast-feeding How should I use this medicine? This medicine is for infusion into a vein. It is given by a health care professional in a hospital or clinic setting. Talk to your pediatrician regarding the use of this medicine in children. Special care may be needed. Overdosage: If you think you have taken too much of this medicine contact a poison control center or emergency room at once. NOTE: This medicine is only for you. Do not share this medicine with others. What if I miss a dose? It is important not to miss your dose. Call your doctor or health care professional if you are unable to keep an appointment. What may interact with this medicine? -certain antibiotics given by injection -NSAIDs, medicines for pain and inflammation, like ibuprofen or naproxen -some diuretics like bumetanide, furosemide -teriparatide -thalidomide This list may not describe all possible interactions. Give your health care provider a list of all the medicines, herbs, non-prescription drugs, or dietary supplements you use. Also tell them if you smoke, drink  alcohol, or use illegal drugs. Some items may interact with your medicine. What should I watch for while using this medicine? Visit your doctor or health care professional for regular checkups. It may be some time before you see the benefit from this medicine. Do not stop taking your medicine unless your doctor tells you to. Your doctor may order blood tests or other tests to see how you are doing. Women should inform their doctor if they wish to become pregnant or think they might be pregnant. There is a potential for serious side effects to an unborn child. Talk to your health care professional or pharmacist for more information. You should make sure that you get enough calcium and vitamin D while you are taking this medicine. Discuss the foods you eat and the vitamins you take with your health care professional. Some people who take this medicine have severe bone, joint, and/or muscle pain. This medicine may also increase your risk for jaw problems or a broken thigh bone. Tell your doctor right away if you have severe pain in your jaw, bones, joints, or muscles. Tell your doctor if you have any pain that does not go away or that gets worse. Tell your dentist and dental surgeon that you are taking this medicine. You should not have major dental surgery while on this medicine. See your dentist to have a dental exam and fix any dental problems before starting this medicine. Take good care of your teeth while on this medicine. Make sure  you see your dentist for regular follow-up appointments. What side effects may I notice from receiving this medicine? Side effects that you should report to your doctor or health care professional as soon as possible: -allergic reactions like skin rash, itching or hives, swelling of the face, lips, or tongue -anxiety, confusion, or depression -breathing problems -changes in vision -eye pain -feeling faint or lightheaded, falls -jaw pain, especially after dental  work -mouth sores -muscle cramps, stiffness, or weakness -redness, blistering, peeling or loosening of the skin, including inside the mouth -trouble passing urine or change in the amount of urine Side effects that usually do not require medical attention (report to your doctor or health care professional if they continue or are bothersome): -bone, joint, or muscle pain -constipation -diarrhea -fever -hair loss -irritation at site where injected -loss of appetite -nausea, vomiting -stomach upset -trouble sleeping -trouble swallowing -weak or tired This list may not describe all possible side effects. Call your doctor for medical advice about side effects. You may report side effects to FDA at 1-800-FDA-1088. Where should I keep my medicine? This drug is given in a hospital or clinic and will not be stored at home. NOTE: This sheet is a summary. It may not cover all possible information. If you have questions about this medicine, talk to your doctor, pharmacist, or health care provider.  2019 [employer]/Gold Standard (2014-02-26 14:19:39)

## 2019-03-25 ENCOUNTER — Other Ambulatory Visit: Payer: Self-pay | Admitting: Oncology

## 2019-03-25 DIAGNOSIS — C50811 Malignant neoplasm of overlapping sites of right female breast: Secondary | ICD-10-CM

## 2019-03-25 DIAGNOSIS — Z170 Estrogen receptor positive status [ER+]: Secondary | ICD-10-CM

## 2019-04-12 ENCOUNTER — Other Ambulatory Visit: Payer: BLUE CROSS/BLUE SHIELD

## 2019-04-12 ENCOUNTER — Ambulatory Visit: Payer: BLUE CROSS/BLUE SHIELD

## 2019-04-21 ENCOUNTER — Other Ambulatory Visit: Payer: Self-pay

## 2019-04-21 ENCOUNTER — Inpatient Hospital Stay: Payer: BLUE CROSS/BLUE SHIELD | Attending: Oncology

## 2019-04-21 ENCOUNTER — Inpatient Hospital Stay: Payer: BLUE CROSS/BLUE SHIELD

## 2019-04-21 VITALS — BP 134/67 | HR 71 | Temp 97.1°F | Resp 17

## 2019-04-21 DIAGNOSIS — Z5111 Encounter for antineoplastic chemotherapy: Secondary | ICD-10-CM | POA: Diagnosis not present

## 2019-04-21 DIAGNOSIS — Z170 Estrogen receptor positive status [ER+]: Secondary | ICD-10-CM | POA: Insufficient documentation

## 2019-04-21 DIAGNOSIS — C50911 Malignant neoplasm of unspecified site of right female breast: Secondary | ICD-10-CM

## 2019-04-21 DIAGNOSIS — C50811 Malignant neoplasm of overlapping sites of right female breast: Secondary | ICD-10-CM

## 2019-04-21 DIAGNOSIS — C50411 Malignant neoplasm of upper-outer quadrant of right female breast: Secondary | ICD-10-CM | POA: Diagnosis not present

## 2019-04-21 LAB — CBC WITH DIFFERENTIAL/PLATELET
Abs Immature Granulocytes: 0.01 10*3/uL (ref 0.00–0.07)
Basophils Absolute: 0 10*3/uL (ref 0.0–0.1)
Basophils Relative: 1 %
Eosinophils Absolute: 0 10*3/uL (ref 0.0–0.5)
Eosinophils Relative: 0 %
HCT: 42.4 % (ref 36.0–46.0)
Hemoglobin: 13.5 g/dL (ref 12.0–15.0)
Immature Granulocytes: 0 %
Lymphocytes Relative: 26 %
Lymphs Abs: 1.6 10*3/uL (ref 0.7–4.0)
MCH: 29.2 pg (ref 26.0–34.0)
MCHC: 31.8 g/dL (ref 30.0–36.0)
MCV: 91.6 fL (ref 80.0–100.0)
Monocytes Absolute: 0.3 10*3/uL (ref 0.1–1.0)
Monocytes Relative: 5 %
Neutro Abs: 4.2 10*3/uL (ref 1.7–7.7)
Neutrophils Relative %: 68 %
Platelets: 216 10*3/uL (ref 150–400)
RBC: 4.63 MIL/uL (ref 3.87–5.11)
RDW: 13 % (ref 11.5–15.5)
WBC: 6.1 10*3/uL (ref 4.0–10.5)
nRBC: 0 % (ref 0.0–0.2)

## 2019-04-21 LAB — COMPREHENSIVE METABOLIC PANEL
ALT: 25 U/L (ref 0–44)
AST: 24 U/L (ref 15–41)
Albumin: 4.6 g/dL (ref 3.5–5.0)
Alkaline Phosphatase: 71 U/L (ref 38–126)
Anion gap: 7 (ref 5–15)
BUN: 15 mg/dL (ref 8–23)
CO2: 30 mmol/L (ref 22–32)
Calcium: 9.1 mg/dL (ref 8.9–10.3)
Chloride: 104 mmol/L (ref 98–111)
Creatinine, Ser: 0.7 mg/dL (ref 0.44–1.00)
GFR calc Af Amer: >60 mL/min (ref >60)
GFR calc non Af Amer: >60 mL/min (ref >60)
Glucose, Bld: 87 mg/dL (ref 70–99)
Potassium: 4 mmol/L (ref 3.5–5.1)
Sodium: 141 mmol/L (ref 135–145)
Total Bilirubin: 0.4 mg/dL (ref 0.3–1.2)
Total Protein: 7 g/dL (ref 6.5–8.1)

## 2019-04-21 MED ORDER — FULVESTRANT 250 MG/5ML IM SOLN
500.0000 mg | Freq: Once | INTRAMUSCULAR | Status: AC
  Administered 2019-04-21: 500 mg via INTRAMUSCULAR

## 2019-04-21 NOTE — Patient Instructions (Signed)
Fulvestrant injection What is this medicine? FULVESTRANT (ful VES trant) blocks the effects of estrogen. It is used to treat breast cancer. This medicine may be used for other purposes; ask your health care provider or pharmacist if you have questions. COMMON BRAND NAME(S): FASLODEX What should I tell my health care provider before I take this medicine? They need to know if you have any of these conditions:  bleeding disorders  liver disease  low blood counts, like low white cell, platelet, or red cell counts  an unusual or allergic reaction to fulvestrant, other medicines, foods, dyes, or preservatives  pregnant or trying to get pregnant  breast-feeding How should I use this medicine? This medicine is for injection into a muscle. It is usually given by a health care professional in a hospital or clinic setting. Talk to your pediatrician regarding the use of this medicine in children. Special care may be needed. Overdosage: If you think you have taken too much of this medicine contact a poison control center or emergency room at once. NOTE: This medicine is only for you. Do not share this medicine with others. What if I miss a dose? It is important not to miss your dose. Call your doctor or health care professional if you are unable to keep an appointment. What may interact with this medicine?  medicines that treat or prevent blood clots like warfarin, enoxaparin, dalteparin, apixaban, dabigatran, and rivaroxaban This list may not describe all possible interactions. Give your health care provider a list of all the medicines, herbs, non-prescription drugs, or dietary supplements you use. Also tell them if you smoke, drink alcohol, or use illegal drugs. Some items may interact with your medicine. What should I watch for while using this medicine? Your condition will be monitored carefully while you are receiving this medicine. You will need important blood work done while you are taking  this medicine. Do not become pregnant while taking this medicine or for at least 1 year after stopping it. Women of child-bearing potential will need to have a negative pregnancy test before starting this medicine. Women should inform their doctor if they wish to become pregnant or think they might be pregnant. There is a potential for serious side effects to an unborn child. Men should inform their doctors if they wish to father a child. This medicine may lower sperm counts. Talk to your health care professional or pharmacist for more information. Do not breast-feed an infant while taking this medicine or for 1 year after the last dose. What side effects may I notice from receiving this medicine? Side effects that you should report to your doctor or health care professional as soon as possible:  allergic reactions like skin rash, itching or hives, swelling of the face, lips, or tongue  feeling faint or lightheaded, falls  pain, tingling, numbness, or weakness in the legs  signs and symptoms of infection like fever or chills; cough; flu-like symptoms; sore throat  vaginal bleeding Side effects that usually do not require medical attention (report to your doctor or health care professional if they continue or are bothersome):  aches, pains  constipation  diarrhea  headache  hot flashes  nausea, vomiting  pain at site where injected  stomach pain This list may not describe all possible side effects. Call your doctor for medical advice about side effects. You may report side effects to FDA at 1-800-FDA-1088. Where should I keep my medicine? This drug is given in a hospital or clinic and will   not be stored at home. NOTE: This sheet is a summary. It may not cover all possible information. If you have questions about this medicine, talk to your doctor, pharmacist, or health care provider.  2020 [employer]/Gold Standard (2018-01-08 11:34:41)  

## 2019-05-10 ENCOUNTER — Other Ambulatory Visit: Payer: BLUE CROSS/BLUE SHIELD

## 2019-05-10 ENCOUNTER — Ambulatory Visit: Payer: BLUE CROSS/BLUE SHIELD

## 2019-05-19 ENCOUNTER — Other Ambulatory Visit: Payer: Self-pay

## 2019-05-19 ENCOUNTER — Inpatient Hospital Stay: Payer: BLUE CROSS/BLUE SHIELD | Attending: Oncology

## 2019-05-19 ENCOUNTER — Inpatient Hospital Stay: Payer: BLUE CROSS/BLUE SHIELD

## 2019-05-19 VITALS — BP 128/63 | HR 65 | Temp 97.9°F

## 2019-05-19 DIAGNOSIS — C50911 Malignant neoplasm of unspecified site of right female breast: Secondary | ICD-10-CM

## 2019-05-19 DIAGNOSIS — C50811 Malignant neoplasm of overlapping sites of right female breast: Secondary | ICD-10-CM

## 2019-05-19 DIAGNOSIS — Z5111 Encounter for antineoplastic chemotherapy: Secondary | ICD-10-CM | POA: Diagnosis not present

## 2019-05-19 DIAGNOSIS — C50411 Malignant neoplasm of upper-outer quadrant of right female breast: Secondary | ICD-10-CM | POA: Insufficient documentation

## 2019-05-19 DIAGNOSIS — Z170 Estrogen receptor positive status [ER+]: Secondary | ICD-10-CM

## 2019-05-19 LAB — CBC WITH DIFFERENTIAL/PLATELET
Abs Immature Granulocytes: 0.03 10*3/uL (ref 0.00–0.07)
Basophils Absolute: 0 10*3/uL (ref 0.0–0.1)
Basophils Relative: 1 %
Eosinophils Absolute: 0 10*3/uL (ref 0.0–0.5)
Eosinophils Relative: 0 %
HCT: 43.1 % (ref 36.0–46.0)
Hemoglobin: 13.7 g/dL (ref 12.0–15.0)
Immature Granulocytes: 0 %
Lymphocytes Relative: 25 %
Lymphs Abs: 1.7 10*3/uL (ref 0.7–4.0)
MCH: 29.1 pg (ref 26.0–34.0)
MCHC: 31.8 g/dL (ref 30.0–36.0)
MCV: 91.5 fL (ref 80.0–100.0)
Monocytes Absolute: 0.4 10*3/uL (ref 0.1–1.0)
Monocytes Relative: 6 %
Neutro Abs: 4.6 10*3/uL (ref 1.7–7.7)
Neutrophils Relative %: 68 %
Platelets: 244 10*3/uL (ref 150–400)
RBC: 4.71 MIL/uL (ref 3.87–5.11)
RDW: 12.8 % (ref 11.5–15.5)
WBC: 6.8 10*3/uL (ref 4.0–10.5)
nRBC: 0 % (ref 0.0–0.2)

## 2019-05-19 LAB — COMPREHENSIVE METABOLIC PANEL
ALT: 27 U/L (ref 0–44)
AST: 22 U/L (ref 15–41)
Albumin: 4.3 g/dL (ref 3.5–5.0)
Alkaline Phosphatase: 83 U/L (ref 38–126)
Anion gap: 9 (ref 5–15)
BUN: 15 mg/dL (ref 8–23)
CO2: 30 mmol/L (ref 22–32)
Calcium: 9.8 mg/dL (ref 8.9–10.3)
Chloride: 103 mmol/L (ref 98–111)
Creatinine, Ser: 0.76 mg/dL (ref 0.44–1.00)
GFR calc Af Amer: >60 mL/min (ref >60)
GFR calc non Af Amer: >60 mL/min (ref >60)
Glucose, Bld: 76 mg/dL (ref 70–99)
Potassium: 5.3 mmol/L — ABNORMAL HIGH (ref 3.5–5.1)
Sodium: 142 mmol/L (ref 135–145)
Total Bilirubin: 0.3 mg/dL (ref 0.3–1.2)
Total Protein: 7.1 g/dL (ref 6.5–8.1)

## 2019-05-19 MED ORDER — FULVESTRANT 250 MG/5ML IM SOLN
500.0000 mg | Freq: Once | INTRAMUSCULAR | Status: AC
  Administered 2019-05-19: 500 mg via INTRAMUSCULAR

## 2019-05-19 MED ORDER — FULVESTRANT 250 MG/5ML IM SOLN
INTRAMUSCULAR | Status: AC
  Filled 2019-05-19: qty 10

## 2019-05-19 NOTE — Patient Instructions (Signed)
Fulvestrant injection What is this medicine? FULVESTRANT (ful VES trant) blocks the effects of estrogen. It is used to treat breast cancer. This medicine may be used for other purposes; ask your health care provider or pharmacist if you have questions. COMMON BRAND NAME(S): FASLODEX What should I tell my health care provider before I take this medicine? They need to know if you have any of these conditions:  bleeding disorders  liver disease  low blood counts, like low white cell, platelet, or red cell counts  an unusual or allergic reaction to fulvestrant, other medicines, foods, dyes, or preservatives  pregnant or trying to get pregnant  breast-feeding How should I use this medicine? This medicine is for injection into a muscle. It is usually given by a health care professional in a hospital or clinic setting. Talk to your pediatrician regarding the use of this medicine in children. Special care may be needed. Overdosage: If you think you have taken too much of this medicine contact a poison control center or emergency room at once. NOTE: This medicine is only for you. Do not share this medicine with others. What if I miss a dose? It is important not to miss your dose. Call your doctor or health care professional if you are unable to keep an appointment. What may interact with this medicine?  medicines that treat or prevent blood clots like warfarin, enoxaparin, dalteparin, apixaban, dabigatran, and rivaroxaban This list may not describe all possible interactions. Give your health care provider a list of all the medicines, herbs, non-prescription drugs, or dietary supplements you use. Also tell them if you smoke, drink alcohol, or use illegal drugs. Some items may interact with your medicine. What should I watch for while using this medicine? Your condition will be monitored carefully while you are receiving this medicine. You will need important blood work done while you are taking  this medicine. Do not become pregnant while taking this medicine or for at least 1 year after stopping it. Women of child-bearing potential will need to have a negative pregnancy test before starting this medicine. Women should inform their doctor if they wish to become pregnant or think they might be pregnant. There is a potential for serious side effects to an unborn child. Men should inform their doctors if they wish to father a child. This medicine may lower sperm counts. Talk to your health care professional or pharmacist for more information. Do not breast-feed an infant while taking this medicine or for 1 year after the last dose. What side effects may I notice from receiving this medicine? Side effects that you should report to your doctor or health care professional as soon as possible:  allergic reactions like skin rash, itching or hives, swelling of the face, lips, or tongue  feeling faint or lightheaded, falls  pain, tingling, numbness, or weakness in the legs  signs and symptoms of infection like fever or chills; cough; flu-like symptoms; sore throat  vaginal bleeding Side effects that usually do not require medical attention (report to your doctor or health care professional if they continue or are bothersome):  aches, pains  constipation  diarrhea  headache  hot flashes  nausea, vomiting  pain at site where injected  stomach pain This list may not describe all possible side effects. Call your doctor for medical advice about side effects. You may report side effects to FDA at 1-800-FDA-1088. Where should I keep my medicine? This drug is given in a hospital or clinic and will   not be stored at home. NOTE: This sheet is a summary. It may not cover all possible information. If you have questions about this medicine, talk to your doctor, pharmacist, or health care provider.  2020 [employer]/Gold Standard (2018-01-08 11:34:41)  

## 2019-06-07 ENCOUNTER — Ambulatory Visit: Payer: BLUE CROSS/BLUE SHIELD

## 2019-06-07 ENCOUNTER — Other Ambulatory Visit: Payer: BLUE CROSS/BLUE SHIELD

## 2019-06-09 ENCOUNTER — Other Ambulatory Visit: Payer: Self-pay | Admitting: *Deleted

## 2019-06-09 DIAGNOSIS — Z902 Acquired absence of lung [part of]: Secondary | ICD-10-CM

## 2019-06-09 DIAGNOSIS — C3410 Malignant neoplasm of upper lobe, unspecified bronchus or lung: Secondary | ICD-10-CM

## 2019-06-09 DIAGNOSIS — C50911 Malignant neoplasm of unspecified site of right female breast: Secondary | ICD-10-CM

## 2019-06-16 ENCOUNTER — Inpatient Hospital Stay: Payer: BLUE CROSS/BLUE SHIELD | Attending: Oncology

## 2019-06-16 ENCOUNTER — Inpatient Hospital Stay: Payer: BLUE CROSS/BLUE SHIELD

## 2019-06-16 ENCOUNTER — Other Ambulatory Visit: Payer: Self-pay

## 2019-06-16 VITALS — BP 111/66 | HR 66 | Temp 98.0°F

## 2019-06-16 DIAGNOSIS — Z5111 Encounter for antineoplastic chemotherapy: Secondary | ICD-10-CM | POA: Insufficient documentation

## 2019-06-16 DIAGNOSIS — C50811 Malignant neoplasm of overlapping sites of right female breast: Secondary | ICD-10-CM

## 2019-06-16 DIAGNOSIS — Z170 Estrogen receptor positive status [ER+]: Secondary | ICD-10-CM

## 2019-06-16 DIAGNOSIS — C50911 Malignant neoplasm of unspecified site of right female breast: Secondary | ICD-10-CM

## 2019-06-16 DIAGNOSIS — C50411 Malignant neoplasm of upper-outer quadrant of right female breast: Secondary | ICD-10-CM | POA: Diagnosis not present

## 2019-06-16 LAB — CBC WITH DIFFERENTIAL/PLATELET
Abs Immature Granulocytes: 0.01 10*3/uL (ref 0.00–0.07)
Basophils Absolute: 0 10*3/uL (ref 0.0–0.1)
Basophils Relative: 1 %
Eosinophils Absolute: 0.2 10*3/uL (ref 0.0–0.5)
Eosinophils Relative: 3 %
HCT: 42.7 % (ref 36.0–46.0)
Hemoglobin: 13.7 g/dL (ref 12.0–15.0)
Immature Granulocytes: 0 %
Lymphocytes Relative: 29 %
Lymphs Abs: 1.9 10*3/uL (ref 0.7–4.0)
MCH: 29.3 pg (ref 26.0–34.0)
MCHC: 32.1 g/dL (ref 30.0–36.0)
MCV: 91.2 fL (ref 80.0–100.0)
Monocytes Absolute: 0.4 10*3/uL (ref 0.1–1.0)
Monocytes Relative: 6 %
Neutro Abs: 4.2 10*3/uL (ref 1.7–7.7)
Neutrophils Relative %: 61 %
Platelets: 213 10*3/uL (ref 150–400)
RBC: 4.68 MIL/uL (ref 3.87–5.11)
RDW: 13.2 % (ref 11.5–15.5)
WBC: 6.7 10*3/uL (ref 4.0–10.5)
nRBC: 0 % (ref 0.0–0.2)

## 2019-06-16 LAB — COMPREHENSIVE METABOLIC PANEL
ALT: 25 U/L (ref 0–44)
AST: 22 U/L (ref 15–41)
Albumin: 4.5 g/dL (ref 3.5–5.0)
Alkaline Phosphatase: 73 U/L (ref 38–126)
Anion gap: 8 (ref 5–15)
BUN: 14 mg/dL (ref 8–23)
CO2: 30 mmol/L (ref 22–32)
Calcium: 9.6 mg/dL (ref 8.9–10.3)
Chloride: 103 mmol/L (ref 98–111)
Creatinine, Ser: 0.79 mg/dL (ref 0.44–1.00)
GFR calc Af Amer: >60 mL/min (ref >60)
GFR calc non Af Amer: >60 mL/min (ref >60)
Glucose, Bld: 92 mg/dL (ref 70–99)
Potassium: 4.1 mmol/L (ref 3.5–5.1)
Sodium: 141 mmol/L (ref 135–145)
Total Bilirubin: 0.4 mg/dL (ref 0.3–1.2)
Total Protein: 7.3 g/dL (ref 6.5–8.1)

## 2019-06-16 MED ORDER — FULVESTRANT 250 MG/5ML IM SOLN
INTRAMUSCULAR | Status: AC
  Filled 2019-06-16: qty 5

## 2019-06-16 MED ORDER — FULVESTRANT 250 MG/5ML IM SOLN
500.0000 mg | Freq: Once | INTRAMUSCULAR | Status: AC
  Administered 2019-06-16: 500 mg via INTRAMUSCULAR

## 2019-06-16 NOTE — Patient Instructions (Signed)
Fulvestrant injection What is this medicine? FULVESTRANT (ful VES trant) blocks the effects of estrogen. It is used to treat breast cancer. This medicine may be used for other purposes; ask your health care provider or pharmacist if you have questions. COMMON BRAND NAME(S): FASLODEX What should I tell my health care provider before I take this medicine? They need to know if you have any of these conditions:  bleeding disorders  liver disease  low blood counts, like low white cell, platelet, or red cell counts  an unusual or allergic reaction to fulvestrant, other medicines, foods, dyes, or preservatives  pregnant or trying to get pregnant  breast-feeding How should I use this medicine? This medicine is for injection into a muscle. It is usually given by a health care professional in a hospital or clinic setting. Talk to your pediatrician regarding the use of this medicine in children. Special care may be needed. Overdosage: If you think you have taken too much of this medicine contact a poison control center or emergency room at once. NOTE: This medicine is only for you. Do not share this medicine with others. What if I miss a dose? It is important not to miss your dose. Call your doctor or health care professional if you are unable to keep an appointment. What may interact with this medicine?  medicines that treat or prevent blood clots like warfarin, enoxaparin, dalteparin, apixaban, dabigatran, and rivaroxaban This list may not describe all possible interactions. Give your health care provider a list of all the medicines, herbs, non-prescription drugs, or dietary supplements you use. Also tell them if you smoke, drink alcohol, or use illegal drugs. Some items may interact with your medicine. What should I watch for while using this medicine? Your condition will be monitored carefully while you are receiving this medicine. You will need important blood work done while you are taking  this medicine. Do not become pregnant while taking this medicine or for at least 1 year after stopping it. Women of child-bearing potential will need to have a negative pregnancy test before starting this medicine. Women should inform their doctor if they wish to become pregnant or think they might be pregnant. There is a potential for serious side effects to an unborn child. Men should inform their doctors if they wish to father a child. This medicine may lower sperm counts. Talk to your health care professional or pharmacist for more information. Do not breast-feed an infant while taking this medicine or for 1 year after the last dose. What side effects may I notice from receiving this medicine? Side effects that you should report to your doctor or health care professional as soon as possible:  allergic reactions like skin rash, itching or hives, swelling of the face, lips, or tongue  feeling faint or lightheaded, falls  pain, tingling, numbness, or weakness in the legs  signs and symptoms of infection like fever or chills; cough; flu-like symptoms; sore throat  vaginal bleeding Side effects that usually do not require medical attention (report to your doctor or health care professional if they continue or are bothersome):  aches, pains  constipation  diarrhea  headache  hot flashes  nausea, vomiting  pain at site where injected  stomach pain This list may not describe all possible side effects. Call your doctor for medical advice about side effects. You may report side effects to FDA at 1-800-FDA-1088. Where should I keep my medicine? This drug is given in a hospital or clinic and will   not be stored at home. NOTE: This sheet is a summary. It may not cover all possible information. If you have questions about this medicine, talk to your doctor, pharmacist, or health care provider.  2020 [employer]/Gold Standard (2018-01-08 11:34:41)  

## 2019-07-05 ENCOUNTER — Ambulatory Visit: Payer: BLUE CROSS/BLUE SHIELD

## 2019-07-05 ENCOUNTER — Other Ambulatory Visit: Payer: BLUE CROSS/BLUE SHIELD

## 2019-07-14 ENCOUNTER — Inpatient Hospital Stay: Payer: BLUE CROSS/BLUE SHIELD

## 2019-07-14 ENCOUNTER — Other Ambulatory Visit: Payer: Self-pay

## 2019-07-14 DIAGNOSIS — C50911 Malignant neoplasm of unspecified site of right female breast: Secondary | ICD-10-CM

## 2019-07-14 DIAGNOSIS — Z170 Estrogen receptor positive status [ER+]: Secondary | ICD-10-CM

## 2019-07-14 DIAGNOSIS — C50411 Malignant neoplasm of upper-outer quadrant of right female breast: Secondary | ICD-10-CM | POA: Diagnosis not present

## 2019-07-14 DIAGNOSIS — C50811 Malignant neoplasm of overlapping sites of right female breast: Secondary | ICD-10-CM

## 2019-07-14 DIAGNOSIS — Z5111 Encounter for antineoplastic chemotherapy: Secondary | ICD-10-CM | POA: Diagnosis not present

## 2019-07-14 LAB — COMPREHENSIVE METABOLIC PANEL
ALT: 27 U/L (ref 0–44)
AST: 23 U/L (ref 15–41)
Albumin: 4.8 g/dL (ref 3.5–5.0)
Alkaline Phosphatase: 75 U/L (ref 38–126)
Anion gap: 7 (ref 5–15)
BUN: 14 mg/dL (ref 8–23)
CO2: 29 mmol/L (ref 22–32)
Calcium: 10.2 mg/dL (ref 8.9–10.3)
Chloride: 103 mmol/L (ref 98–111)
Creatinine, Ser: 0.7 mg/dL (ref 0.44–1.00)
GFR calc Af Amer: >60 mL/min (ref >60)
GFR calc non Af Amer: >60 mL/min (ref >60)
Glucose, Bld: 98 mg/dL (ref 70–99)
Potassium: 4.9 mmol/L (ref 3.5–5.1)
Sodium: 139 mmol/L (ref 135–145)
Total Bilirubin: 0.4 mg/dL (ref 0.3–1.2)
Total Protein: 7.6 g/dL (ref 6.5–8.1)

## 2019-07-14 LAB — CBC WITH DIFFERENTIAL/PLATELET
Abs Immature Granulocytes: 0.05 10*3/uL (ref 0.00–0.07)
Basophils Absolute: 0 10*3/uL (ref 0.0–0.1)
Basophils Relative: 1 %
Eosinophils Absolute: 0 10*3/uL (ref 0.0–0.5)
Eosinophils Relative: 0 %
HCT: 42.7 % (ref 36.0–46.0)
Hemoglobin: 13.4 g/dL (ref 12.0–15.0)
Immature Granulocytes: 1 %
Lymphocytes Relative: 30 %
Lymphs Abs: 2.1 10*3/uL (ref 0.7–4.0)
MCH: 28.3 pg (ref 26.0–34.0)
MCHC: 31.4 g/dL (ref 30.0–36.0)
MCV: 90.3 fL (ref 80.0–100.0)
Monocytes Absolute: 0.4 10*3/uL (ref 0.1–1.0)
Monocytes Relative: 6 %
Neutro Abs: 4.4 10*3/uL (ref 1.7–7.7)
Neutrophils Relative %: 62 %
Platelets: 253 10*3/uL (ref 150–400)
RBC: 4.73 MIL/uL (ref 3.87–5.11)
RDW: 13.2 % (ref 11.5–15.5)
WBC: 7.1 10*3/uL (ref 4.0–10.5)
nRBC: 0 % (ref 0.0–0.2)

## 2019-07-14 MED ORDER — FULVESTRANT 250 MG/5ML IM SOLN
500.0000 mg | Freq: Once | INTRAMUSCULAR | Status: AC
  Administered 2019-07-14: 11:00:00 500 mg via INTRAMUSCULAR

## 2019-07-14 NOTE — Patient Instructions (Signed)
Fulvestrant injection What is this medicine? FULVESTRANT (ful VES trant) blocks the effects of estrogen. It is used to treat breast cancer. This medicine may be used for other purposes; ask your health care provider or pharmacist if you have questions. COMMON BRAND NAME(S): FASLODEX What should I tell my health care provider before I take this medicine? They need to know if you have any of these conditions:  bleeding disorders  liver disease  low blood counts, like low white cell, platelet, or red cell counts  an unusual or allergic reaction to fulvestrant, other medicines, foods, dyes, or preservatives  pregnant or trying to get pregnant  breast-feeding How should I use this medicine? This medicine is for injection into a muscle. It is usually given by a health care professional in a hospital or clinic setting. Talk to your pediatrician regarding the use of this medicine in children. Special care may be needed. Overdosage: If you think you have taken too much of this medicine contact a poison control center or emergency room at once. NOTE: This medicine is only for you. Do not share this medicine with others. What if I miss a dose? It is important not to miss your dose. Call your doctor or health care professional if you are unable to keep an appointment. What may interact with this medicine?  medicines that treat or prevent blood clots like warfarin, enoxaparin, dalteparin, apixaban, dabigatran, and rivaroxaban This list may not describe all possible interactions. Give your health care provider a list of all the medicines, herbs, non-prescription drugs, or dietary supplements you use. Also tell them if you smoke, drink alcohol, or use illegal drugs. Some items may interact with your medicine. What should I watch for while using this medicine? Your condition will be monitored carefully while you are receiving this medicine. You will need important blood work done while you are taking  this medicine. Do not become pregnant while taking this medicine or for at least 1 year after stopping it. Women of child-bearing potential will need to have a negative pregnancy test before starting this medicine. Women should inform their doctor if they wish to become pregnant or think they might be pregnant. There is a potential for serious side effects to an unborn child. Men should inform their doctors if they wish to father a child. This medicine may lower sperm counts. Talk to your health care professional or pharmacist for more information. Do not breast-feed an infant while taking this medicine or for 1 year after the last dose. What side effects may I notice from receiving this medicine? Side effects that you should report to your doctor or health care professional as soon as possible:  allergic reactions like skin rash, itching or hives, swelling of the face, lips, or tongue  feeling faint or lightheaded, falls  pain, tingling, numbness, or weakness in the legs  signs and symptoms of infection like fever or chills; cough; flu-like symptoms; sore throat  vaginal bleeding Side effects that usually do not require medical attention (report to your doctor or health care professional if they continue or are bothersome):  aches, pains  constipation  diarrhea  headache  hot flashes  nausea, vomiting  pain at site where injected  stomach pain This list may not describe all possible side effects. Call your doctor for medical advice about side effects. You may report side effects to FDA at 1-800-FDA-1088. Where should I keep my medicine? This drug is given in a hospital or clinic and will   not be stored at home. NOTE: This sheet is a summary. It may not cover all possible information. If you have questions about this medicine, talk to your doctor, pharmacist, or health care provider.  2020 [employer]/Gold Standard (2018-01-08 11:34:41)  

## 2019-08-02 ENCOUNTER — Ambulatory Visit: Payer: Self-pay

## 2019-08-02 ENCOUNTER — Ambulatory Visit: Payer: BLUE CROSS/BLUE SHIELD | Admitting: Oncology

## 2019-08-02 ENCOUNTER — Other Ambulatory Visit: Payer: BLUE CROSS/BLUE SHIELD

## 2019-08-02 ENCOUNTER — Ambulatory Visit: Payer: BLUE CROSS/BLUE SHIELD

## 2019-08-09 ENCOUNTER — Ambulatory Visit: Payer: BLUE CROSS/BLUE SHIELD | Admitting: Oncology

## 2019-08-09 ENCOUNTER — Other Ambulatory Visit: Payer: BLUE CROSS/BLUE SHIELD

## 2019-08-10 ENCOUNTER — Ambulatory Visit (HOSPITAL_COMMUNITY): Payer: BLUE CROSS/BLUE SHIELD

## 2019-08-10 ENCOUNTER — Other Ambulatory Visit: Payer: Self-pay

## 2019-08-10 ENCOUNTER — Ambulatory Visit (HOSPITAL_BASED_OUTPATIENT_CLINIC_OR_DEPARTMENT_OTHER)
Admission: RE | Admit: 2019-08-10 | Discharge: 2019-08-10 | Disposition: A | Discharge status: Home / Self Care | Payer: BLUE CROSS/BLUE SHIELD | Source: Ambulatory Visit | Attending: Oncology | Admitting: Oncology

## 2019-08-10 DIAGNOSIS — R918 Other nonspecific abnormal finding of lung field: Secondary | ICD-10-CM | POA: Diagnosis not present

## 2019-08-10 DIAGNOSIS — Z902 Acquired absence of lung [part of]: Secondary | ICD-10-CM

## 2019-08-10 DIAGNOSIS — C3410 Malignant neoplasm of upper lobe, unspecified bronchus or lung: Secondary | ICD-10-CM | POA: Insufficient documentation

## 2019-08-10 DIAGNOSIS — C50911 Malignant neoplasm of unspecified site of right female breast: Secondary | ICD-10-CM | POA: Diagnosis not present

## 2019-08-10 MED ORDER — IOHEXOL 300 MG/ML  SOLN
100.0000 mL | Freq: Once | INTRAMUSCULAR | Status: AC | PRN
  Administered 2019-08-10: 100 mL via INTRAVENOUS

## 2019-08-10 NOTE — Progress Notes (Signed)
[place]  [phone] [phone]     ID: KEYRY IRACHETA   DOB: February 03, 1956  MR#: [id number]  WSF#:[id number]  Patient Care Team: Seward Carol, MD as PCP - General (Internal Medicine) Anaeli Cornwall, Virgie Dad, MD as Consulting Physician (Oncology) Aloha Gell, MD as Consulting Physician (Obstetrics and Gynecology) [name], MD as Consulting Physician (General Surgery) Garlan Fair, MD as Consulting Physician (Gastroenterology) Belva Crome, MD as Consulting Physician (Cardiology) OTHER MD: Arnoldo Hooker [place]]   CHIEF COMPLAINT: recurrent estrogen receptor positive breast cancer (s/p right mastectomy); pT1a lung cancer  CURRENT TREATMENT: Fulvestrant, zoledronate   INTERVAL HISTORY: Crystal [name] returns today for follow-up and treatment of her estrogen receptor positive breast cancer, and also for follow-up of her lung cancer.   She continues on monthly fulvestrant. She has some fatigue. She will have a few days of diarrhea following.  She also receives zoledronate every 6 months, with her most recent dose on 03/24/2019. She experienced significant bone pain and some febrile chills with the first dose, but did better with the second.  Since her last visit, she underwent follow up chest CT scan yesterday, 08/10/2019, which showed no evidence of active disease   REVIEW OF SYSTEMS: Crystal [name] is very active, taking care of her 64, 67, and 64-year-old grandchildren several days now that her daughter has gotten a job.  She also has a garden that she runs.  She walks at least 10,000 steps most days.  She is taking appropriate pandemic precautions.  A detailed review of systems today was otherwise stable.   BREAST CANCER HISTORY:  From the original intake note:  The patient had bilateral diagnostic mammography 02/12/2011.  She has a history of cystic changes noted on prior studies in April 2011 and April 2010.  On the Feb 12, 2011 study there were  new calcifications identified superiorly in the right breast, without any obvious mass.  The patient was brought back for further studies, 02/19/2011 and Dr. Marcelo Baldy was able to localize the calcifications under stereotactic guidance.  The biopsy ([id number]) showed high-grade ductal carcinoma with papillary features which was 70% estrogen receptor positive, 0% progesterone receptor positive.  There was no definitive invasion and therefore further studies were not performed.   Bilateral breast MRIs were obtained Feb 26, 2011.  This showed marked nodular enhancement throughout both breasts, and in the upper central portion there was a post biopsy area of change measuring up to 3.7 cm.  Most of this is a fluid cavity with a thin rim of enhancement.  There were no suspicious areas in the left breast.  No internal mammary or axillary lymph nodes.  Of course, the patient has a history of hepatic adenomas and these were noted incidentally on the MRI.   Her definitive surgical treatment and subsequent history are as detailed below.   PAST MEDICAL HISTORY: Past Medical History:  Diagnosis Date   Anxiety    Cancer [facility])    right breast   Complication of anesthesia    hard to wake up   GERD (gastroesophageal reflux disease)    History of small bowel obstruction    Insomnia    Liver masses   history of small-bowel obstruction with partial colectomy March 2001.  The patient has a history of hepatic adenomas.  She has had liver resections x2.  The data for this is at [facility] and we will try to obtain it.  She has also had incisional hernia repair.  She has a history  of osteopenia, and she has a history of multiple anti-red cell antibodies making a transfusion difficult.  The patient tells me that she has a history of low calcium and a tendency to get liver injections.   PAST SURGICAL HISTORY: Past Surgical History:  Procedure Laterality Date   BREAST SURGERY Right    HERNIA REPAIR     incisional  hernia repair x3   laparotomies     LIVER SURGERY     LOBECTOMY Right 11/29/2015   Procedure: Completion Right Upper  LOBECTOMY, Lymph node dissection, on Q placement;  Surgeon: Grace Isaac, MD;  Location: [place];  Service: Thoracic;  Laterality: Right;   MASTECTOMY W/ SENTINEL NODE BIOPSY Right 07/05/2015   Procedure: RIGHT MASTECTOMY WITH SENTINEL NODE BIOPSY AND PORT A CATH INSERTION;  Surgeon: [name], MD;  Location: [place];  Service: General;  Laterality: Right;   PORTACATH PLACEMENT Left 07/05/2015   Procedure: INSERTION PORT-A-CATH;  Surgeon: [name], MD;  Location: [place];  Service: General;  Laterality: Left;   VIDEO ASSISTED THORACOSCOPY (VATS)/WEDGE RESECTION Right 11/29/2015   Procedure: BRONCHOSCOPY , VIDEO ASSISTED THORACOSCOPY (VATS)/WEDGE RESECTION;  Surgeon: Grace Isaac, MD;  Location: [facility];  Service: Thoracic;  Laterality: Right;    FAMILY HISTORY: Family History  Problem Relation Age of Onset   Ovarian cancer Mother 50       "metastasized to breasts"   Breast cancer Maternal Grandmother 6       2-3 recurrences   Stomach cancer Maternal Grandfather        dx. 39-49; metastatic stomach cancer   Colon polyps Father        about 2-3 polyps removed at each colonoscopy   Colon polyps Brother        "a few"   Other Maternal Aunt        +TAH   Colon cancer Paternal Aunt 70   Brain cancer Daughter 29       astrocytoma   Breast cancer Cousin        dx. 60s   The patient's mother died from ovarian cancer at the age of 64.  The patient's father is alive at age 62.  She has 2 brothers, no sisters.  A maternal grandmother was diagnosed with breast cancer at the age of 34, and the maternal grandfather had stomach cancer at the age of 14.   GYNECOLOGIC HISTORY: The patient had menarche at age 63.  Her last menstrual period was in 2005.  She never used hormone replacement.  She used birth control  briefly to control bleeding prior to 1980.  She was 63 years old at the birth of her first child.  She started having mammograms at age 63 because of a strong family history.   SOCIAL HISTORY: Christiann works as a Programmer, applications for [employer].   She also heads the local "[employer]" Her husband, Aleiyah Halpin, is a retired Engineer, maintenance from a [employer] locally.  Son, [name], lives in [place] and works for that [employer]  Daughter, [name], lives in [place] and is a homemaker.    ADVANCED DIRECTIVES: in place   HEALTH MAINTENANCE: Social History   Tobacco Use   Smoking status: Never Smoker   Smokeless tobacco: Never Used  Substance Use Topics   Alcohol use: Yes    Alcohol/week: 1.0 standard drinks    Types: 1 Glasses of wine per week  Comment: social   Drug use: No    Colonoscopy: 2014  PAP:  Bone density:   Lipid panel:  Allergies  Allergen Reactions   Promethazine Hcl Other (See Comments)    Causes shaking   Tylenol [Acetaminophen] Other (See Comments)    Avoids Tylenol products due to liver disease    Penicillins Rash    Current Outpatient Medications  Medication Sig Dispense Refill   b complex vitamins tablet Take 1 tablet by mouth daily.       calcium-vitamin D (OSCAL WITH D) 500-200 MG-UNIT per tablet Take 2 tablets by mouth daily.     famotidine (PEPCID AC) 10 MG chewable tablet Chew 10 mg by mouth daily as needed for heartburn.     fulvestrant (FASLODEX) 250 MG/5ML injection Inject 500 mg into the muscle every 30 (thirty) days. One injection each buttock over 1-2 minutes. Warm prior to use.     glucosamine-chondroitin 500-400 MG tablet Take 1 tablet by mouth 3 (three) times daily.       ibuprofen (ADVIL,MOTRIN) 200 MG tablet Take 200 mg by mouth every 6 (six) hours as needed for fever or mild pain.      Multiple Vitamins-Minerals (MULTIVITAMIN WITH MINERALS) tablet Take 1 tablet by mouth daily.        Omega-3 Fatty Acids (FISH OIL) 500 MG CAPS Take 1,000 mg by mouth daily.     Turmeric 500 MG TABS Take 1,000 mg by mouth daily.     No current facility-administered medications for this visit.     OBJECTIVE: Middle-aged white woman who appears well  Vitals:   08/11/19 1333  BP: 131/73  Pulse: 76  Resp: 18  Temp: 98.5 F (36.9 C)  SpO2: 100%     Body mass index is 22.1 kg/m.    ECOG FS: 1  Sclerae unicteric, EOMs intact Wearing a mask No cervical or supraclavicular adenopathy Lungs no rales or rhonchi Heart regular rate and rhythm Abd soft, nontender, positive bowel sounds MSK no focal spinal tenderness, no upper extremity lymphedema Neuro: nonfocal, well oriented, appropriate affect Breasts: The right breast is status post mastectomy.  There is no evidence of chest wall recurrence.  Left breast is benign.  Both axillae are benign.  LAB RESULTS: Lab Results  Component Value Date   WBC 6.9 08/11/2019   NEUTROABS 4.2 08/11/2019   HGB 13.4 08/11/2019   HCT 42.4 08/11/2019   MCV 91.6 08/11/2019   PLT 221 08/11/2019      Chemistry      Component Value Date/Time   NA 142 08/11/2019 1048   NA 140 09/29/2017 0830   K 4.3 08/11/2019 1048   K 4.6 09/29/2017 0830   CL 105 08/11/2019 1048   CL 102 03/15/2013 0912   CO2 29 08/11/2019 1048   CO2 25 09/29/2017 0830   BUN 14 08/11/2019 1048   BUN 17.4 09/29/2017 0830   CREATININE 0.74 08/11/2019 1048   CREATININE 0.77 03/24/2019 1057   CREATININE 0.8 09/29/2017 0830      Component Value Date/Time   CALCIUM 9.9 08/11/2019 1048   CALCIUM 9.8 09/29/2017 0830   ALKPHOS 72 08/11/2019 1048   ALKPHOS 123 09/29/2017 0830   AST 25 08/11/2019 1048   AST 22 03/24/2019 1057   AST 22 09/29/2017 0830   ALT 27 08/11/2019 1048   ALT 25 03/24/2019 1057   ALT 32 09/29/2017 0830   BILITOT 0.4 08/11/2019 1048   BILITOT 0.4 03/24/2019 1057   BILITOT 0.26 09/29/2017  0830       Lab Results  Component Value Date   LABCA2  28 10/20/2012    No components found for: [id number]  No results for input(s): INR in the last 168 hours.  Urinalysis    Component Value Date/Time   COLORURINE YELLOW 11/27/2015 1403    STUDIES: Ct Chest W Contrast  Result Date: 08/10/2019 CLINICAL DATA:  History of right-sided breast cancer status post mastectomy, history of right upper lobe malignancy status post right upper lobectomy EXAM: CT CHEST WITH CONTRAST TECHNIQUE: Multidetector CT imaging of the chest was performed during intravenous contrast administration. CONTRAST:  181m OMNIPAQUE IOHEXOL 300 MG/ML  SOLN COMPARISON:  07/23/2018, 09/25/2017 FINDINGS: Cardiovascular: No significant vascular findings. Normal heart size. No pericardial effusion. Mediastinum/Nodes: No enlarged mediastinal, hilar, or axillary lymph nodes. Thyroid gland, trachea, and esophagus demonstrate no significant findings. Lungs/Pleura: Status post right upper lobectomy. Stable, partially calcified and benign left apical pulmonary nodule (series 3, image 30). Stable 2-3 mm subpleural pulmonary nodule of the dependent right lower lobe (series 3, image 130). No pleural effusion or pneumothorax. Upper Abdomen: No acute abnormality. Status post right hepatectomy. There is a redemonstrated subcapsular mass of the anterior midline liver which is ill-defined, slightly hyperdense and heterogeneous, which may be slightly decreased in size compared to prior examination the most dominant in superior component measuring 4.1 x 2.3 cm, previously 4.5 x 3.0 cm when measured similar on prior examination (series 2, image 143). Musculoskeletal: Status post right mastectomy. IMPRESSION: 1. Stable appearance of the chest status post right upper lobectomy. 2.  Stable small pulmonary nodules. 3.  Status post right mastectomy. 4. Status post right hepatectomy. There is a redemonstrated subcapsular mass of the anterior midline liver which is ill-defined, slightly hyperdense and heterogeneous,  which may be slightly decreased in size compared to prior examination the most dominant in superior component measuring 4.1 x 2.3 cm, previously 4.5 x 3.0 cm when measured similar on prior examination (series 2, image 143). The test of choice for assessment of liver metastatic disease is dedicated multiphasic contrast enhanced CT or MRI of the abdomen. Electronically Signed   By: AEddie CandleM.D.   On: 08/10/2019 15:08      ASSESSMENT: 63y.o.  BRCA 1-2 and p-53 negative [facility] [place] woman status post right lumpectomy and sentinel lymph node sampling June 2012 for a T1a N0, Stage IA  invasive ductal carcinoma, grade 3, estrogen receptor 98% positive, progesterone receptor 97% positive, with an MIB-1 of 5% and no HER-2 amplification   (a) note that the initial Right breast biopsy showed DCIS w papillary architecture, estrogen receptor 70% positive, progesterone receptor negative  (1) completed radiation September 2012.   (2) on letrozole starting July 2012, discontinued August 2016 with recurrence   RECURRENT DISEASE: (3) right breast upper outer quadrant biopsy 05/23/2015 was positive for a clinical T1c N0, stage IA invasive ductal carcinoma, grade 2, estrogen receptor 60% positive with moderate staining intensity, progesterone receptor negative, with no HER-2 amplification and an MIB-1 of 90%.  (4) status post right simple mastectomy 07/05/2015 for a pT1c pN0, stage IA invasive ductal carcinoma, grade 3, with negative margins. 4 lymph nodes removed with mastectomy were benign.  (a) the patient is not planning on reconstruction  (5) began cyclophosphamide and docetaxel 07/26/2015, repeated every 21 days 4, final dose 09/26/2015  (6) fulvestrant started 10/23/2015  (7) genetics testing 07/20/2015 found no deleterious mutation in ATM, BARD1, BRCA1, BRCA2, BRIP1, CDH1, CHEK2, FANCC, MLH1, MSH2, MSH6,  NBN, PALB2, PMS2, PTEN, RAD51C, RAD51D, TP53, and XRCC2. Additionally no variants of uncertain  significance were found.   LUNG CANCER: (8) RUL spiculated nodule measuring 1.4 cm noted on staging CT scan 06/13/2015-- stable on repeat CT 07/25/15--moderately hot and slightly larger on PET 10/31/2015  (a) status post right upper lobectomy and lymph node dissection 11/29/2015 for a pT1a pN0, stage IA primary lung adenocarcinoma (estrogen, progesterone, and gross cystic disease fluid protein negative, strongly Napsin-A and TTF-1 positive), low-grade, with negative margins   (9) history of multiple hepatic adenomas  (a) not a candidate for tamoxifen  (10) osteopenia:  (a) bone density 03/14/2016 showed a T score of -2.1.  (b) zoledronate started December 2019   PLAN: Janalee is now a little over 4 years out from definitive surgery for breast cancer with no evidence of disease recurrence.  This is very favorable.  She is tolerating fulvestrant well and we are continuing that.  She also receives zoledronate every 6 months, with her next dose due 10/13/2019.  She is thinking of establishing herself with oncologist near [place].  She could then receive all her treatment there.  For now though she wants to continue follow-up here at least through March, which is when she turns 52 and starts Medicare.  She will have her next Zometa dose in December  She knows to call for any other problem that may develop before the next visit.  Charlii Yost, Virgie Dad, MD  08/11/19 1:54 PM Medical Oncology and Hematology [facility] [address] [place], [place] [address] Tel. [phone]    Fax. [phone]   I, Wilburn Mylar, am acting as scribe for Dr. Virgie Dad. Rykker Coviello.  I, Lurline Del MD, have reviewed the above documentation for accuracy and completeness, and I agree with the above.

## 2019-08-11 ENCOUNTER — Inpatient Hospital Stay (HOSPITAL_BASED_OUTPATIENT_CLINIC_OR_DEPARTMENT_OTHER): Payer: BLUE CROSS/BLUE SHIELD | Admitting: Oncology

## 2019-08-11 ENCOUNTER — Inpatient Hospital Stay: Payer: BLUE CROSS/BLUE SHIELD

## 2019-08-11 ENCOUNTER — Inpatient Hospital Stay: Payer: BLUE CROSS/BLUE SHIELD | Attending: Oncology

## 2019-08-11 ENCOUNTER — Other Ambulatory Visit: Payer: Self-pay

## 2019-08-11 VITALS — BP 118/66 | HR 67 | Temp 97.7°F | Resp 20

## 2019-08-11 VITALS — BP 131/73 | HR 76 | Temp 98.5°F | Resp 18 | Ht 66.0 in | Wt 136.9 lb

## 2019-08-11 DIAGNOSIS — C50811 Malignant neoplasm of overlapping sites of right female breast: Secondary | ICD-10-CM | POA: Diagnosis not present

## 2019-08-11 DIAGNOSIS — C50911 Malignant neoplasm of unspecified site of right female breast: Secondary | ICD-10-CM

## 2019-08-11 DIAGNOSIS — Z5111 Encounter for antineoplastic chemotherapy: Secondary | ICD-10-CM | POA: Diagnosis not present

## 2019-08-11 DIAGNOSIS — C50411 Malignant neoplasm of upper-outer quadrant of right female breast: Secondary | ICD-10-CM | POA: Diagnosis not present

## 2019-08-11 DIAGNOSIS — C3410 Malignant neoplasm of upper lobe, unspecified bronchus or lung: Secondary | ICD-10-CM

## 2019-08-11 DIAGNOSIS — Z170 Estrogen receptor positive status [ER+]: Secondary | ICD-10-CM

## 2019-08-11 LAB — COMPREHENSIVE METABOLIC PANEL
ALT: 27 U/L (ref 0–44)
AST: 25 U/L (ref 15–41)
Albumin: 5 g/dL (ref 3.5–5.0)
Alkaline Phosphatase: 72 U/L (ref 38–126)
Anion gap: 8 (ref 5–15)
BUN: 14 mg/dL (ref 8–23)
CO2: 29 mmol/L (ref 22–32)
Calcium: 9.9 mg/dL (ref 8.9–10.3)
Chloride: 105 mmol/L (ref 98–111)
Creatinine, Ser: 0.74 mg/dL (ref 0.44–1.00)
GFR calc Af Amer: >60 mL/min (ref >60)
GFR calc non Af Amer: >60 mL/min (ref >60)
Glucose, Bld: 85 mg/dL (ref 70–99)
Potassium: 4.3 mmol/L (ref 3.5–5.1)
Sodium: 142 mmol/L (ref 135–145)
Total Bilirubin: 0.4 mg/dL (ref 0.3–1.2)
Total Protein: 7.3 g/dL (ref 6.5–8.1)

## 2019-08-11 LAB — CBC WITH DIFFERENTIAL/PLATELET
Abs Immature Granulocytes: 0.02 10*3/uL (ref 0.00–0.07)
Basophils Absolute: 0 10*3/uL (ref 0.0–0.1)
Basophils Relative: 0 %
Eosinophils Absolute: 0.4 10*3/uL (ref 0.0–0.5)
Eosinophils Relative: 6 %
HCT: 42.4 % (ref 36.0–46.0)
Hemoglobin: 13.4 g/dL (ref 12.0–15.0)
Immature Granulocytes: 0 %
Lymphocytes Relative: 27 %
Lymphs Abs: 1.9 10*3/uL (ref 0.7–4.0)
MCH: 28.9 pg (ref 26.0–34.0)
MCHC: 31.6 g/dL (ref 30.0–36.0)
MCV: 91.6 fL (ref 80.0–100.0)
Monocytes Absolute: 0.4 10*3/uL (ref 0.1–1.0)
Monocytes Relative: 5 %
Neutro Abs: 4.2 10*3/uL (ref 1.7–7.7)
Neutrophils Relative %: 62 %
Platelets: 221 10*3/uL (ref 150–400)
RBC: 4.63 MIL/uL (ref 3.87–5.11)
RDW: 13.2 % (ref 11.5–15.5)
WBC: 6.9 10*3/uL (ref 4.0–10.5)
nRBC: 0 % (ref 0.0–0.2)

## 2019-08-11 MED ORDER — FULVESTRANT 250 MG/5ML IM SOLN
INTRAMUSCULAR | Status: AC
  Filled 2019-08-11: qty 10

## 2019-08-11 MED ORDER — FULVESTRANT 250 MG/5ML IM SOLN
500.0000 mg | Freq: Once | INTRAMUSCULAR | Status: AC
  Administered 2019-08-11: 12:00:00 500 mg via INTRAMUSCULAR

## 2019-08-11 NOTE — Patient Instructions (Signed)
Fulvestrant injection What is this medicine? FULVESTRANT (ful VES trant) blocks the effects of estrogen. It is used to treat breast cancer. This medicine may be used for other purposes; ask your health care provider or pharmacist if you have questions. COMMON BRAND NAME(S): FASLODEX What should I tell my health care provider before I take this medicine? They need to know if you have any of these conditions:  bleeding disorders  liver disease  low blood counts, like low white cell, platelet, or red cell counts  an unusual or allergic reaction to fulvestrant, other medicines, foods, dyes, or preservatives  pregnant or trying to get pregnant  breast-feeding How should I use this medicine? This medicine is for injection into a muscle. It is usually given by a health care professional in a hospital or clinic setting. Talk to your pediatrician regarding the use of this medicine in children. Special care may be needed. Overdosage: If you think you have taken too much of this medicine contact a poison control center or emergency room at once. NOTE: This medicine is only for you. Do not share this medicine with others. What if I miss a dose? It is important not to miss your dose. Call your doctor or health care professional if you are unable to keep an appointment. What may interact with this medicine?  medicines that treat or prevent blood clots like warfarin, enoxaparin, dalteparin, apixaban, dabigatran, and rivaroxaban This list may not describe all possible interactions. Give your health care provider a list of all the medicines, herbs, non-prescription drugs, or dietary supplements you use. Also tell them if you smoke, drink alcohol, or use illegal drugs. Some items may interact with your medicine. What should I watch for while using this medicine? Your condition will be monitored carefully while you are receiving this medicine. You will need important blood work done while you are taking  this medicine. Do not become pregnant while taking this medicine or for at least 1 year after stopping it. Women of child-bearing potential will need to have a negative pregnancy test before starting this medicine. Women should inform their doctor if they wish to become pregnant or think they might be pregnant. There is a potential for serious side effects to an unborn child. Men should inform their doctors if they wish to father a child. This medicine may lower sperm counts. Talk to your health care professional or pharmacist for more information. Do not breast-feed an infant while taking this medicine or for 1 year after the last dose. What side effects may I notice from receiving this medicine? Side effects that you should report to your doctor or health care professional as soon as possible:  allergic reactions like skin rash, itching or hives, swelling of the face, lips, or tongue  feeling faint or lightheaded, falls  pain, tingling, numbness, or weakness in the legs  signs and symptoms of infection like fever or chills; cough; flu-like symptoms; sore throat  vaginal bleeding Side effects that usually do not require medical attention (report to your doctor or health care professional if they continue or are bothersome):  aches, pains  constipation  diarrhea  headache  hot flashes  nausea, vomiting  pain at site where injected  stomach pain This list may not describe all possible side effects. Call your doctor for medical advice about side effects. You may report side effects to FDA at 1-800-FDA-1088. Where should I keep my medicine? This drug is given in a hospital or clinic and will   not be stored at home. NOTE: This sheet is a summary. It may not cover all possible information. If you have questions about this medicine, talk to your doctor, pharmacist, or health care provider.  2020 [employer]/Gold Standard (2018-01-08 11:34:41)  

## 2019-08-12 ENCOUNTER — Telehealth: Payer: Self-pay | Admitting: Oncology

## 2019-08-12 NOTE — Telephone Encounter (Signed)
I talk with patient regarding schedule  

## 2019-08-26 ENCOUNTER — Telehealth: Payer: Self-pay

## 2019-08-26 NOTE — Telephone Encounter (Signed)
Received vm from pt stating that her 12/02 appts need to be moved to the [facility] location. In basket msg sent to scheduling dept to call pt to get those appt's rescheduled in [facility].

## 2019-09-14 ENCOUNTER — Encounter: Payer: Self-pay | Admitting: Oncology

## 2019-09-14 DIAGNOSIS — Z1231 Encounter for screening mammogram for malignant neoplasm of breast: Secondary | ICD-10-CM | POA: Diagnosis not present

## 2019-09-14 DIAGNOSIS — Z853 Personal history of malignant neoplasm of breast: Secondary | ICD-10-CM | POA: Diagnosis not present

## 2019-09-14 DIAGNOSIS — C50911 Malignant neoplasm of unspecified site of right female breast: Secondary | ICD-10-CM | POA: Diagnosis not present

## 2019-09-15 ENCOUNTER — Inpatient Hospital Stay: Payer: BLUE CROSS/BLUE SHIELD | Attending: Oncology

## 2019-09-15 ENCOUNTER — Inpatient Hospital Stay: Payer: BLUE CROSS/BLUE SHIELD

## 2019-09-15 ENCOUNTER — Other Ambulatory Visit: Payer: Self-pay

## 2019-09-15 ENCOUNTER — Ambulatory Visit: Payer: BLUE CROSS/BLUE SHIELD

## 2019-09-15 ENCOUNTER — Other Ambulatory Visit: Payer: BLUE CROSS/BLUE SHIELD

## 2019-09-15 VITALS — BP 113/51 | HR 67 | Temp 97.3°F | Resp 18

## 2019-09-15 DIAGNOSIS — C50911 Malignant neoplasm of unspecified site of right female breast: Secondary | ICD-10-CM

## 2019-09-15 DIAGNOSIS — M810 Age-related osteoporosis without current pathological fracture: Secondary | ICD-10-CM | POA: Diagnosis not present

## 2019-09-15 DIAGNOSIS — Z5111 Encounter for antineoplastic chemotherapy: Secondary | ICD-10-CM | POA: Diagnosis not present

## 2019-09-15 DIAGNOSIS — C50411 Malignant neoplasm of upper-outer quadrant of right female breast: Secondary | ICD-10-CM | POA: Diagnosis not present

## 2019-09-15 DIAGNOSIS — C50811 Malignant neoplasm of overlapping sites of right female breast: Secondary | ICD-10-CM

## 2019-09-15 DIAGNOSIS — Z170 Estrogen receptor positive status [ER+]: Secondary | ICD-10-CM | POA: Diagnosis not present

## 2019-09-15 LAB — CBC WITH DIFFERENTIAL/PLATELET
Abs Immature Granulocytes: 0.03 10*3/uL (ref 0.00–0.07)
Basophils Absolute: 0.1 10*3/uL (ref 0.0–0.1)
Basophils Relative: 1 %
Eosinophils Absolute: 0 10*3/uL (ref 0.0–0.5)
Eosinophils Relative: 0 %
HCT: 41.2 % (ref 36.0–46.0)
Hemoglobin: 13.7 g/dL (ref 12.0–15.0)
Immature Granulocytes: 0 %
Lymphocytes Relative: 23 %
Lymphs Abs: 1.6 10*3/uL (ref 0.7–4.0)
MCH: 29.5 pg (ref 26.0–34.0)
MCHC: 33.3 g/dL (ref 30.0–36.0)
MCV: 88.8 fL (ref 80.0–100.0)
Monocytes Absolute: 0.3 10*3/uL (ref 0.1–1.0)
Monocytes Relative: 4 %
Neutro Abs: 5 10*3/uL (ref 1.7–7.7)
Neutrophils Relative %: 72 %
Platelets: 224 10*3/uL (ref 150–400)
RBC: 4.64 MIL/uL (ref 3.87–5.11)
RDW: 13.1 % (ref 11.5–15.5)
WBC: 7 10*3/uL (ref 4.0–10.5)
nRBC: 0 % (ref 0.0–0.2)

## 2019-09-15 LAB — COMPREHENSIVE METABOLIC PANEL
ALT: 31 U/L (ref 0–44)
AST: 24 U/L (ref 15–41)
Albumin: 4.9 g/dL (ref 3.5–5.0)
Alkaline Phosphatase: 84 U/L (ref 38–126)
Anion gap: 10 (ref 5–15)
BUN: 15 mg/dL (ref 8–23)
CO2: 23 mmol/L (ref 22–32)
Calcium: 9.3 mg/dL (ref 8.9–10.3)
Chloride: 104 mmol/L (ref 98–111)
Creatinine, Ser: 0.59 mg/dL (ref 0.44–1.00)
GFR calc Af Amer: >60 mL/min (ref >60)
GFR calc non Af Amer: >60 mL/min (ref >60)
Glucose, Bld: 114 mg/dL — ABNORMAL HIGH (ref 70–99)
Potassium: 3.9 mmol/L (ref 3.5–5.1)
Sodium: 137 mmol/L (ref 135–145)
Total Bilirubin: 0.5 mg/dL (ref 0.3–1.2)
Total Protein: 7.5 g/dL (ref 6.5–8.1)

## 2019-09-15 MED ORDER — FULVESTRANT 250 MG/5ML IM SOLN
INTRAMUSCULAR | Status: AC
  Filled 2019-09-15: qty 10

## 2019-09-15 MED ORDER — SODIUM CHLORIDE 0.9 % IV SOLN
Freq: Once | INTRAVENOUS | Status: AC
  Administered 2019-09-15: 11:00:00 via INTRAVENOUS
  Filled 2019-09-15: qty 250

## 2019-09-15 MED ORDER — FULVESTRANT 250 MG/5ML IM SOLN
500.0000 mg | Freq: Once | INTRAMUSCULAR | Status: AC
  Administered 2019-09-15: 500 mg via INTRAMUSCULAR

## 2019-09-15 MED ORDER — ZOLEDRONIC ACID 4 MG/100ML IV SOLN
4.0000 mg | Freq: Once | INTRAVENOUS | Status: AC
  Administered 2019-09-15: 4 mg via INTRAVENOUS
  Filled 2019-09-15: qty 100

## 2019-09-15 NOTE — Patient Instructions (Signed)
Zoledronic Acid injection (Hypercalcemia, Oncology) What is this medicine? ZOLEDRONIC ACID (ZOE le dron ik AS id) lowers the amount of calcium loss from bone. It is used to treat too much calcium in your blood from cancer. It is also used to prevent complications of cancer that has spread to the bone. This medicine may be used for other purposes; ask your health care provider or pharmacist if you have questions. COMMON BRAND NAME(S): Zometa What should I tell my health care provider before I take this medicine? They need to know if you have any of these conditions:  aspirin-sensitive asthma  cancer, especially if you are receiving medicines used to treat cancer  dental disease or wear dentures  infection  kidney disease  receiving corticosteroids like dexamethasone or prednisone  an unusual or allergic reaction to zoledronic acid, other medicines, foods, dyes, or preservatives  pregnant or trying to get pregnant  breast-feeding How should I use this medicine? This medicine is for infusion into a vein. It is given by a health care professional in a hospital or clinic setting. Talk to your pediatrician regarding the use of this medicine in children. Special care may be needed. Overdosage: If you think you have taken too much of this medicine contact a poison control center or emergency room at once. NOTE: This medicine is only for you. Do not share this medicine with others. What if I miss a dose? It is important not to miss your dose. Call your doctor or health care professional if you are unable to keep an appointment. What may interact with this medicine?  certain antibiotics given by injection  NSAIDs, medicines for pain and inflammation, like ibuprofen or naproxen  some diuretics like bumetanide, furosemide  teriparatide  thalidomide This list may not describe all possible interactions. Give your health care provider a list of all the medicines, herbs, non-prescription  drugs, or dietary supplements you use. Also tell them if you smoke, drink alcohol, or use illegal drugs. Some items may interact with your medicine. What should I watch for while using this medicine? Visit your doctor or health care professional for regular checkups. It may be some time before you see the benefit from this medicine. Do not stop taking your medicine unless your doctor tells you to. Your doctor may order blood tests or other tests to see how you are doing. Women should inform their doctor if they wish to become pregnant or think they might be pregnant. There is a potential for serious side effects to an unborn child. Talk to your health care professional or pharmacist for more information. You should make sure that you get enough calcium and vitamin D while you are taking this medicine. Discuss the foods you eat and the vitamins you take with your health care professional. Some people who take this medicine have severe bone, joint, and/or muscle pain. This medicine may also increase your risk for jaw problems or a broken thigh bone. Tell your doctor right away if you have severe pain in your jaw, bones, joints, or muscles. Tell your doctor if you have any pain that does not go away or that gets worse. Tell your dentist and dental surgeon that you are taking this medicine. You should not have major dental surgery while on this medicine. See your dentist to have a dental exam and fix any dental problems before starting this medicine. Take good care of your teeth while on this medicine. Make sure you see your dentist for regular follow-up  appointments. What side effects may I notice from receiving this medicine? Side effects that you should report to your doctor or health care professional as soon as possible:  allergic reactions like skin rash, itching or hives, swelling of the face, lips, or tongue  anxiety, confusion, or depression  breathing problems  changes in vision  eye  pain  feeling faint or lightheaded, falls  jaw pain, especially after dental work  mouth sores  muscle cramps, stiffness, or weakness  redness, blistering, peeling or loosening of the skin, including inside the mouth  trouble passing urine or change in the amount of urine Side effects that usually do not require medical attention (report to your doctor or health care professional if they continue or are bothersome):  bone, joint, or muscle pain  constipation  diarrhea  fever  hair loss  irritation at site where injected  loss of appetite  nausea, vomiting  stomach upset  trouble sleeping  trouble swallowing  weak or tired This list may not describe all possible side effects. Call your doctor for medical advice about side effects. You may report side effects to FDA at 1-800-FDA-1088. Where should I keep my medicine? This drug is given in a hospital or clinic and will not be stored at home. NOTE: This sheet is a summary. It may not cover all possible information. If you have questions about this medicine, talk to your doctor, pharmacist, or health care provider.  2020 Elsevier/Gold Standard (2014-02-26 14:19:39) Fulvestrant injection What is this medicine? FULVESTRANT (ful VES trant) blocks the effects of estrogen. It is used to treat breast cancer. This medicine may be used for other purposes; ask your health care provider or pharmacist if you have questions. COMMON BRAND NAME(S): FASLODEX What should I tell my health care provider before I take this medicine? They need to know if you have any of these conditions:  bleeding disorders  liver disease  low blood counts, like low white cell, platelet, or red cell counts  an unusual or allergic reaction to fulvestrant, other medicines, foods, dyes, or preservatives  pregnant or trying to get pregnant  breast-feeding How should I use this medicine? This medicine is for injection into a muscle. It is usually  given by a health care professional in a hospital or clinic setting. Talk to your pediatrician regarding the use of this medicine in children. Special care may be needed. Overdosage: If you think you have taken too much of this medicine contact a poison control center or emergency room at once. NOTE: This medicine is only for you. Do not share this medicine with others. What if I miss a dose? It is important not to miss your dose. Call your doctor or health care professional if you are unable to keep an appointment. What may interact with this medicine?  medicines that treat or prevent blood clots like warfarin, enoxaparin, dalteparin, apixaban, dabigatran, and rivaroxaban This list may not describe all possible interactions. Give your health care provider a list of all the medicines, herbs, non-prescription drugs, or dietary supplements you use. Also tell them if you smoke, drink alcohol, or use illegal drugs. Some items may interact with your medicine. What should I watch for while using this medicine? Your condition will be monitored carefully while you are receiving this medicine. You will need important blood work done while you are taking this medicine. Do not become pregnant while taking this medicine or for at least 1 year after stopping it. Women of child-bearing potential  will need to have a negative pregnancy test before starting this medicine. Women should inform their doctor if they wish to become pregnant or think they might be pregnant. There is a potential for serious side effects to an unborn child. Men should inform their doctors if they wish to father a child. This medicine may lower sperm counts. Talk to your health care professional or pharmacist for more information. Do not breast-feed an infant while taking this medicine or for 1 year after the last dose. What side effects may I notice from receiving this medicine? Side effects that you should report to your doctor or health care  professional as soon as possible:  allergic reactions like skin rash, itching or hives, swelling of the face, lips, or tongue  feeling faint or lightheaded, falls  pain, tingling, numbness, or weakness in the legs  signs and symptoms of infection like fever or chills; cough; flu-like symptoms; sore throat  vaginal bleeding Side effects that usually do not require medical attention (report to your doctor or health care professional if they continue or are bothersome):  aches, pains  constipation  diarrhea  headache  hot flashes  nausea, vomiting  pain at site where injected  stomach pain This list may not describe all possible side effects. Call your doctor for medical advice about side effects. You may report side effects to FDA at 1-800-FDA-1088. Where should I keep my medicine? This drug is given in a hospital or clinic and will not be stored at home. NOTE: This sheet is a summary. It may not cover all possible information. If you have questions about this medicine, talk to your doctor, pharmacist, or health care provider.  2020 Elsevier/Gold Standard (2018-01-08 11:34:41)

## 2019-10-13 ENCOUNTER — Inpatient Hospital Stay: Payer: BLUE CROSS/BLUE SHIELD

## 2019-10-13 ENCOUNTER — Ambulatory Visit: Payer: BLUE CROSS/BLUE SHIELD

## 2019-10-13 ENCOUNTER — Other Ambulatory Visit: Payer: BLUE CROSS/BLUE SHIELD

## 2019-10-13 ENCOUNTER — Other Ambulatory Visit: Payer: Self-pay

## 2019-10-13 VITALS — BP 119/63 | HR 71 | Temp 97.5°F | Resp 18 | Wt 137.0 lb

## 2019-10-13 DIAGNOSIS — C50811 Malignant neoplasm of overlapping sites of right female breast: Secondary | ICD-10-CM

## 2019-10-13 DIAGNOSIS — Z5111 Encounter for antineoplastic chemotherapy: Secondary | ICD-10-CM | POA: Diagnosis not present

## 2019-10-13 DIAGNOSIS — C50411 Malignant neoplasm of upper-outer quadrant of right female breast: Secondary | ICD-10-CM | POA: Diagnosis not present

## 2019-10-13 DIAGNOSIS — C50911 Malignant neoplasm of unspecified site of right female breast: Secondary | ICD-10-CM

## 2019-10-13 DIAGNOSIS — Z170 Estrogen receptor positive status [ER+]: Secondary | ICD-10-CM | POA: Diagnosis not present

## 2019-10-13 DIAGNOSIS — M810 Age-related osteoporosis without current pathological fracture: Secondary | ICD-10-CM | POA: Diagnosis not present

## 2019-10-13 LAB — CBC WITH DIFFERENTIAL/PLATELET
Abs Immature Granulocytes: 0.02 10*3/uL (ref 0.00–0.07)
Basophils Absolute: 0 10*3/uL (ref 0.0–0.1)
Basophils Relative: 1 %
Eosinophils Absolute: 0 10*3/uL (ref 0.0–0.5)
Eosinophils Relative: 0 %
HCT: 41.9 % (ref 36.0–46.0)
Hemoglobin: 13.6 g/dL (ref 12.0–15.0)
Immature Granulocytes: 0 %
Lymphocytes Relative: 27 %
Lymphs Abs: 1.7 10*3/uL (ref 0.7–4.0)
MCH: 29.3 pg (ref 26.0–34.0)
MCHC: 32.5 g/dL (ref 30.0–36.0)
MCV: 90.3 fL (ref 80.0–100.0)
Monocytes Absolute: 0.3 10*3/uL (ref 0.1–1.0)
Monocytes Relative: 5 %
Neutro Abs: 4.2 10*3/uL (ref 1.7–7.7)
Neutrophils Relative %: 67 %
Platelets: 215 10*3/uL (ref 150–400)
RBC: 4.64 MIL/uL (ref 3.87–5.11)
RDW: 12.9 % (ref 11.5–15.5)
WBC: 6.2 10*3/uL (ref 4.0–10.5)
nRBC: 0 % (ref 0.0–0.2)

## 2019-10-13 LAB — COMPREHENSIVE METABOLIC PANEL
ALT: 22 U/L (ref 0–44)
AST: 18 U/L (ref 15–41)
Albumin: 4.4 g/dL (ref 3.5–5.0)
Alkaline Phosphatase: 75 U/L (ref 38–126)
Anion gap: 6 (ref 5–15)
BUN: 20 mg/dL (ref 8–23)
CO2: 32 mmol/L (ref 22–32)
Calcium: 10.1 mg/dL (ref 8.9–10.3)
Chloride: 103 mmol/L (ref 98–111)
Creatinine, Ser: 0.81 mg/dL (ref 0.44–1.00)
GFR calc Af Amer: >60 mL/min (ref >60)
GFR calc non Af Amer: >60 mL/min (ref >60)
Glucose, Bld: 103 mg/dL — ABNORMAL HIGH (ref 70–99)
Potassium: 4.4 mmol/L (ref 3.5–5.1)
Sodium: 141 mmol/L (ref 135–145)
Total Bilirubin: 0.3 mg/dL (ref 0.3–1.2)
Total Protein: 7.1 g/dL (ref 6.5–8.1)

## 2019-10-13 MED ORDER — FULVESTRANT 250 MG/5ML IM SOLN
500.0000 mg | Freq: Once | INTRAMUSCULAR | Status: AC
  Administered 2019-10-13: 500 mg via INTRAMUSCULAR

## 2019-10-13 MED ORDER — FULVESTRANT 250 MG/5ML IM SOLN
INTRAMUSCULAR | Status: AC
  Filled 2019-10-13: qty 10

## 2019-10-13 NOTE — Patient Instructions (Signed)
Fulvestrant injection What is this medicine? FULVESTRANT (ful VES trant) blocks the effects of estrogen. It is used to treat breast cancer. This medicine may be used for other purposes; ask your health care provider or pharmacist if you have questions. COMMON BRAND NAME(S): FASLODEX What should I tell my health care provider before I take this medicine? They need to know if you have any of these conditions:  bleeding disorders  liver disease  low blood counts, like low white cell, platelet, or red cell counts  an unusual or allergic reaction to fulvestrant, other medicines, foods, dyes, or preservatives  pregnant or trying to get pregnant  breast-feeding How should I use this medicine? This medicine is for injection into a muscle. It is usually given by a health care professional in a hospital or clinic setting. Talk to your pediatrician regarding the use of this medicine in children. Special care may be needed. Overdosage: If you think you have taken too much of this medicine contact a poison control center or emergency room at once. NOTE: This medicine is only for you. Do not share this medicine with others. What if I miss a dose? It is important not to miss your dose. Call your doctor or health care professional if you are unable to keep an appointment. What may interact with this medicine?  medicines that treat or prevent blood clots like warfarin, enoxaparin, dalteparin, apixaban, dabigatran, and rivaroxaban This list may not describe all possible interactions. Give your health care provider a list of all the medicines, herbs, non-prescription drugs, or dietary supplements you use. Also tell them if you smoke, drink alcohol, or use illegal drugs. Some items may interact with your medicine. What should I watch for while using this medicine? Your condition will be monitored carefully while you are receiving this medicine. You will need important blood work done while you are taking  this medicine. Do not become pregnant while taking this medicine or for at least 1 year after stopping it. Women of child-bearing potential will need to have a negative pregnancy test before starting this medicine. Women should inform their doctor if they wish to become pregnant or think they might be pregnant. There is a potential for serious side effects to an unborn child. Men should inform their doctors if they wish to father a child. This medicine may lower sperm counts. Talk to your health care professional or pharmacist for more information. Do not breast-feed an infant while taking this medicine or for 1 year after the last dose. What side effects may I notice from receiving this medicine? Side effects that you should report to your doctor or health care professional as soon as possible:  allergic reactions like skin rash, itching or hives, swelling of the face, lips, or tongue  feeling faint or lightheaded, falls  pain, tingling, numbness, or weakness in the legs  signs and symptoms of infection like fever or chills; cough; flu-like symptoms; sore throat  vaginal bleeding Side effects that usually do not require medical attention (report to your doctor or health care professional if they continue or are bothersome):  aches, pains  constipation  diarrhea  headache  hot flashes  nausea, vomiting  pain at site where injected  stomach pain This list may not describe all possible side effects. Call your doctor for medical advice about side effects. You may report side effects to FDA at 1-800-FDA-1088. Where should I keep my medicine? This drug is given in a hospital or clinic and will   not be stored at home. NOTE: This sheet is a summary. It may not cover all possible information. If you have questions about this medicine, talk to your doctor, pharmacist, or health care provider.  2020 [employer]/Gold Standard (2018-01-08 11:34:41)  

## 2019-11-03 NOTE — Telephone Encounter (Signed)
No entry 

## 2019-11-10 ENCOUNTER — Ambulatory Visit: Payer: BLUE CROSS/BLUE SHIELD

## 2019-11-10 ENCOUNTER — Other Ambulatory Visit: Payer: Self-pay

## 2019-11-10 ENCOUNTER — Other Ambulatory Visit: Payer: BLUE CROSS/BLUE SHIELD

## 2019-11-10 ENCOUNTER — Inpatient Hospital Stay: Payer: BC Managed Care – PPO

## 2019-11-10 ENCOUNTER — Inpatient Hospital Stay: Payer: BC Managed Care – PPO | Attending: Oncology

## 2019-11-10 VITALS — BP 124/68 | HR 71 | Temp 97.7°F | Resp 18

## 2019-11-10 DIAGNOSIS — C50811 Malignant neoplasm of overlapping sites of right female breast: Secondary | ICD-10-CM

## 2019-11-10 DIAGNOSIS — C50411 Malignant neoplasm of upper-outer quadrant of right female breast: Secondary | ICD-10-CM | POA: Insufficient documentation

## 2019-11-10 DIAGNOSIS — Z5111 Encounter for antineoplastic chemotherapy: Secondary | ICD-10-CM | POA: Insufficient documentation

## 2019-11-10 DIAGNOSIS — C50911 Malignant neoplasm of unspecified site of right female breast: Secondary | ICD-10-CM

## 2019-11-10 DIAGNOSIS — Z170 Estrogen receptor positive status [ER+]: Secondary | ICD-10-CM

## 2019-11-10 LAB — CBC WITH DIFFERENTIAL/PLATELET
Abs Immature Granulocytes: 0.02 10*3/uL (ref 0.00–0.07)
Basophils Absolute: 0 10*3/uL (ref 0.0–0.1)
Basophils Relative: 1 %
Eosinophils Absolute: 0 10*3/uL (ref 0.0–0.5)
Eosinophils Relative: 0 %
HCT: 44.4 % (ref 36.0–46.0)
Hemoglobin: 14.2 g/dL (ref 12.0–15.0)
Immature Granulocytes: 0 %
Lymphocytes Relative: 29 %
Lymphs Abs: 2.1 10*3/uL (ref 0.7–4.0)
MCH: 29.2 pg (ref 26.0–34.0)
MCHC: 32 g/dL (ref 30.0–36.0)
MCV: 91.4 fL (ref 80.0–100.0)
Monocytes Absolute: 0.4 10*3/uL (ref 0.1–1.0)
Monocytes Relative: 5 %
Neutro Abs: 4.6 10*3/uL (ref 1.7–7.7)
Neutrophils Relative %: 65 %
Platelets: 247 10*3/uL (ref 150–400)
RBC: 4.86 MIL/uL (ref 3.87–5.11)
RDW: 12.9 % (ref 11.5–15.5)
WBC: 7.1 10*3/uL (ref 4.0–10.5)
nRBC: 0 % (ref 0.0–0.2)

## 2019-11-10 LAB — COMPREHENSIVE METABOLIC PANEL
ALT: 26 U/L (ref 0–44)
AST: 22 U/L (ref 15–41)
Albumin: 4.8 g/dL (ref 3.5–5.0)
Alkaline Phosphatase: 77 U/L (ref 38–126)
Anion gap: 7 (ref 5–15)
BUN: 14 mg/dL (ref 8–23)
CO2: 34 mmol/L — ABNORMAL HIGH (ref 22–32)
Calcium: 10.4 mg/dL — ABNORMAL HIGH (ref 8.9–10.3)
Chloride: 103 mmol/L (ref 98–111)
Creatinine, Ser: 0.83 mg/dL (ref 0.44–1.00)
GFR calc Af Amer: >60 mL/min (ref >60)
GFR calc non Af Amer: >60 mL/min (ref >60)
Glucose, Bld: 109 mg/dL — ABNORMAL HIGH (ref 70–99)
Potassium: 5.7 mmol/L — ABNORMAL HIGH (ref 3.5–5.1)
Sodium: 144 mmol/L (ref 135–145)
Total Bilirubin: 0.4 mg/dL (ref 0.3–1.2)
Total Protein: 7.6 g/dL (ref 6.5–8.1)

## 2019-11-10 MED ORDER — FULVESTRANT 250 MG/5ML IM SOLN
500.0000 mg | Freq: Once | INTRAMUSCULAR | Status: AC
  Administered 2019-11-10: 11:00:00 500 mg via INTRAMUSCULAR

## 2019-12-06 ENCOUNTER — Telehealth: Payer: Self-pay | Admitting: Oncology

## 2019-12-06 NOTE — Telephone Encounter (Signed)
Rescheduled 4/28 appt per 2/17 sch msg. Pt is aware of new appt date and time.

## 2019-12-08 ENCOUNTER — Ambulatory Visit: Payer: BLUE CROSS/BLUE SHIELD

## 2019-12-08 ENCOUNTER — Other Ambulatory Visit: Payer: BLUE CROSS/BLUE SHIELD

## 2019-12-14 ENCOUNTER — Other Ambulatory Visit: Payer: Self-pay | Admitting: *Deleted

## 2019-12-14 DIAGNOSIS — C3410 Malignant neoplasm of upper lobe, unspecified bronchus or lung: Secondary | ICD-10-CM

## 2019-12-14 DIAGNOSIS — C50911 Malignant neoplasm of unspecified site of right female breast: Secondary | ICD-10-CM

## 2019-12-15 ENCOUNTER — Other Ambulatory Visit: Payer: Self-pay

## 2019-12-15 ENCOUNTER — Inpatient Hospital Stay: Payer: Medicare Other

## 2019-12-15 ENCOUNTER — Inpatient Hospital Stay: Payer: Medicare Other | Attending: Oncology

## 2019-12-15 VITALS — BP 117/57 | HR 64 | Temp 97.1°F | Resp 18

## 2019-12-15 DIAGNOSIS — C50911 Malignant neoplasm of unspecified site of right female breast: Secondary | ICD-10-CM

## 2019-12-15 DIAGNOSIS — C3410 Malignant neoplasm of upper lobe, unspecified bronchus or lung: Secondary | ICD-10-CM

## 2019-12-15 DIAGNOSIS — Z170 Estrogen receptor positive status [ER+]: Secondary | ICD-10-CM | POA: Diagnosis not present

## 2019-12-15 DIAGNOSIS — C50411 Malignant neoplasm of upper-outer quadrant of right female breast: Secondary | ICD-10-CM | POA: Insufficient documentation

## 2019-12-15 DIAGNOSIS — C50811 Malignant neoplasm of overlapping sites of right female breast: Secondary | ICD-10-CM

## 2019-12-15 DIAGNOSIS — Z5111 Encounter for antineoplastic chemotherapy: Secondary | ICD-10-CM | POA: Insufficient documentation

## 2019-12-15 LAB — CBC WITH DIFFERENTIAL (CANCER CENTER ONLY)
Abs Immature Granulocytes: 0.02 10*3/uL (ref 0.00–0.07)
Basophils Absolute: 0.1 10*3/uL (ref 0.0–0.1)
Basophils Relative: 1 %
Eosinophils Absolute: 0 10*3/uL (ref 0.0–0.5)
Eosinophils Relative: 0 %
HCT: 42.1 % (ref 36.0–46.0)
Hemoglobin: 13.6 g/dL (ref 12.0–15.0)
Immature Granulocytes: 0 %
Lymphocytes Relative: 30 %
Lymphs Abs: 2 10*3/uL (ref 0.7–4.0)
MCH: 29.1 pg (ref 26.0–34.0)
MCHC: 32.3 g/dL (ref 30.0–36.0)
MCV: 90.1 fL (ref 80.0–100.0)
Monocytes Absolute: 0.4 10*3/uL (ref 0.1–1.0)
Monocytes Relative: 6 %
Neutro Abs: 4.3 10*3/uL (ref 1.7–7.7)
Neutrophils Relative %: 63 %
Platelet Count: 231 10*3/uL (ref 150–400)
RBC: 4.67 MIL/uL (ref 3.87–5.11)
RDW: 13.1 % (ref 11.5–15.5)
WBC Count: 6.8 10*3/uL (ref 4.0–10.5)
nRBC: 0 % (ref 0.0–0.2)

## 2019-12-15 LAB — CMP (CANCER CENTER ONLY)
ALT: 26 U/L (ref 0–44)
AST: 20 U/L (ref 15–41)
Albumin: 4.9 g/dL (ref 3.5–5.0)
Alkaline Phosphatase: 74 U/L (ref 38–126)
Anion gap: 7 (ref 5–15)
BUN: 14 mg/dL (ref 8–23)
CO2: 30 mmol/L (ref 22–32)
Calcium: 10.4 mg/dL — ABNORMAL HIGH (ref 8.9–10.3)
Chloride: 105 mmol/L (ref 98–111)
Creatinine: 0.81 mg/dL (ref 0.44–1.00)
GFR, Est AFR Am: >60 mL/min (ref >60)
GFR, Estimated: >60 mL/min (ref >60)
Glucose, Bld: 95 mg/dL (ref 70–99)
Potassium: 5.1 mmol/L (ref 3.5–5.1)
Sodium: 142 mmol/L (ref 135–145)
Total Bilirubin: 0.3 mg/dL (ref 0.3–1.2)
Total Protein: 7.5 g/dL (ref 6.5–8.1)

## 2019-12-15 MED ORDER — FULVESTRANT 250 MG/5ML IM SOLN
500.0000 mg | Freq: Once | INTRAMUSCULAR | Status: AC
  Administered 2019-12-15: 500 mg via INTRAMUSCULAR

## 2019-12-15 MED ORDER — FULVESTRANT 250 MG/5ML IM SOLN
INTRAMUSCULAR | Status: AC
  Filled 2019-12-15: qty 10

## 2019-12-15 NOTE — Patient Instructions (Signed)
Fulvestrant injection What is this medicine? FULVESTRANT (ful VES trant) blocks the effects of estrogen. It is used to treat breast cancer. This medicine may be used for other purposes; ask your health care provider or pharmacist if you have questions. COMMON BRAND NAME(S): FASLODEX What should I tell my health care provider before I take this medicine? They need to know if you have any of these conditions:  bleeding disorders  liver disease  low blood counts, like low white cell, platelet, or red cell counts  an unusual or allergic reaction to fulvestrant, other medicines, foods, dyes, or preservatives  pregnant or trying to get pregnant  breast-feeding How should I use this medicine? This medicine is for injection into a muscle. It is usually given by a health care professional in a hospital or clinic setting. Talk to your pediatrician regarding the use of this medicine in children. Special care may be needed. Overdosage: If you think you have taken too much of this medicine contact a poison control center or emergency room at once. NOTE: This medicine is only for you. Do not share this medicine with others. What if I miss a dose? It is important not to miss your dose. Call your doctor or health care professional if you are unable to keep an appointment. What may interact with this medicine?  medicines that treat or prevent blood clots like warfarin, enoxaparin, dalteparin, apixaban, dabigatran, and rivaroxaban This list may not describe all possible interactions. Give your health care provider a list of all the medicines, herbs, non-prescription drugs, or dietary supplements you use. Also tell them if you smoke, drink alcohol, or use illegal drugs. Some items may interact with your medicine. What should I watch for while using this medicine? Your condition will be monitored carefully while you are receiving this medicine. You will need important blood work done while you are taking  this medicine. Do not become pregnant while taking this medicine or for at least 1 year after stopping it. Women of child-bearing potential will need to have a negative pregnancy test before starting this medicine. Women should inform their doctor if they wish to become pregnant or think they might be pregnant. There is a potential for serious side effects to an unborn child. Men should inform their doctors if they wish to father a child. This medicine may lower sperm counts. Talk to your health care professional or pharmacist for more information. Do not breast-feed an infant while taking this medicine or for 1 year after the last dose. What side effects may I notice from receiving this medicine? Side effects that you should report to your doctor or health care professional as soon as possible:  allergic reactions like skin rash, itching or hives, swelling of the face, lips, or tongue  feeling faint or lightheaded, falls  pain, tingling, numbness, or weakness in the legs  signs and symptoms of infection like fever or chills; cough; flu-like symptoms; sore throat  vaginal bleeding Side effects that usually do not require medical attention (report to your doctor or health care professional if they continue or are bothersome):  aches, pains  constipation  diarrhea  headache  hot flashes  nausea, vomiting  pain at site where injected  stomach pain This list may not describe all possible side effects. Call your doctor for medical advice about side effects. You may report side effects to FDA at 1-800-FDA-1088. Where should I keep my medicine? This drug is given in a hospital or clinic and will   not be stored at home. NOTE: This sheet is a summary. It may not cover all possible information. If you have questions about this medicine, talk to your doctor, pharmacist, or health care provider.  2020 [employer]/Gold Standard (2018-01-08 11:34:41)  

## 2019-12-28 ENCOUNTER — Other Ambulatory Visit: Payer: BLUE CROSS/BLUE SHIELD

## 2019-12-28 ENCOUNTER — Ambulatory Visit: Payer: BLUE CROSS/BLUE SHIELD | Admitting: Oncology

## 2020-01-05 ENCOUNTER — Other Ambulatory Visit: Payer: BLUE CROSS/BLUE SHIELD

## 2020-01-05 ENCOUNTER — Ambulatory Visit: Payer: BLUE CROSS/BLUE SHIELD

## 2020-01-12 ENCOUNTER — Inpatient Hospital Stay: Payer: Medicare Other

## 2020-01-12 ENCOUNTER — Other Ambulatory Visit: Payer: Self-pay

## 2020-01-12 VITALS — BP 139/64 | HR 74 | Temp 97.1°F | Resp 18 | Ht 66.0 in | Wt 137.1 lb

## 2020-01-12 DIAGNOSIS — C50911 Malignant neoplasm of unspecified site of right female breast: Secondary | ICD-10-CM

## 2020-01-12 DIAGNOSIS — C50811 Malignant neoplasm of overlapping sites of right female breast: Secondary | ICD-10-CM

## 2020-01-12 DIAGNOSIS — Z170 Estrogen receptor positive status [ER+]: Secondary | ICD-10-CM

## 2020-01-12 DIAGNOSIS — Z5111 Encounter for antineoplastic chemotherapy: Secondary | ICD-10-CM | POA: Diagnosis not present

## 2020-01-12 LAB — COMPREHENSIVE METABOLIC PANEL
ALT: 28 U/L (ref 0–44)
AST: 24 U/L (ref 15–41)
Albumin: 4.7 g/dL (ref 3.5–5.0)
Alkaline Phosphatase: 79 U/L (ref 38–126)
Anion gap: 7 (ref 5–15)
BUN: 17 mg/dL (ref 8–23)
CO2: 30 mmol/L (ref 22–32)
Calcium: 9.9 mg/dL (ref 8.9–10.3)
Chloride: 103 mmol/L (ref 98–111)
Creatinine, Ser: 0.72 mg/dL (ref 0.44–1.00)
GFR calc Af Amer: >60 mL/min (ref >60)
GFR calc non Af Amer: >60 mL/min (ref >60)
Glucose, Bld: 112 mg/dL — ABNORMAL HIGH (ref 70–99)
Potassium: 4.9 mmol/L (ref 3.5–5.1)
Sodium: 140 mmol/L (ref 135–145)
Total Bilirubin: 0.4 mg/dL (ref 0.3–1.2)
Total Protein: 7.3 g/dL (ref 6.5–8.1)

## 2020-01-12 LAB — CBC WITH DIFFERENTIAL/PLATELET
Abs Immature Granulocytes: 0.02 10*3/uL (ref 0.00–0.07)
Basophils Absolute: 0.1 10*3/uL (ref 0.0–0.1)
Basophils Relative: 1 %
Eosinophils Absolute: 0.1 10*3/uL (ref 0.0–0.5)
Eosinophils Relative: 1 %
HCT: 41.2 % (ref 36.0–46.0)
Hemoglobin: 13.3 g/dL (ref 12.0–15.0)
Immature Granulocytes: 0 %
Lymphocytes Relative: 29 %
Lymphs Abs: 1.8 10*3/uL (ref 0.7–4.0)
MCH: 29.1 pg (ref 26.0–34.0)
MCHC: 32.3 g/dL (ref 30.0–36.0)
MCV: 90.2 fL (ref 80.0–100.0)
Monocytes Absolute: 0.4 10*3/uL (ref 0.1–1.0)
Monocytes Relative: 6 %
Neutro Abs: 3.8 10*3/uL (ref 1.7–7.7)
Neutrophils Relative %: 63 %
Platelets: 236 10*3/uL (ref 150–400)
RBC: 4.57 MIL/uL (ref 3.87–5.11)
RDW: 13 % (ref 11.5–15.5)
WBC: 6 10*3/uL (ref 4.0–10.5)
nRBC: 0 % (ref 0.0–0.2)

## 2020-01-12 MED ORDER — FULVESTRANT 250 MG/5ML IM SOLN
500.0000 mg | Freq: Once | INTRAMUSCULAR | Status: AC
  Administered 2020-01-12: 500 mg via INTRAMUSCULAR

## 2020-01-12 MED ORDER — FULVESTRANT 250 MG/5ML IM SOLN
INTRAMUSCULAR | Status: AC
  Filled 2020-01-12: qty 5

## 2020-01-12 NOTE — Patient Instructions (Signed)
Fulvestrant injection What is this medicine? FULVESTRANT (ful VES trant) blocks the effects of estrogen. It is used to treat breast cancer. This medicine may be used for other purposes; ask your health care provider or pharmacist if you have questions. COMMON BRAND NAME(S): FASLODEX What should I tell my health care provider before I take this medicine? They need to know if you have any of these conditions:  bleeding disorders  liver disease  low blood counts, like low white cell, platelet, or red cell counts  an unusual or allergic reaction to fulvestrant, other medicines, foods, dyes, or preservatives  pregnant or trying to get pregnant  breast-feeding How should I use this medicine? This medicine is for injection into a muscle. It is usually given by a health care professional in a hospital or clinic setting. Talk to your pediatrician regarding the use of this medicine in children. Special care may be needed. Overdosage: If you think you have taken too much of this medicine contact a poison control center or emergency room at once. NOTE: This medicine is only for you. Do not share this medicine with others. What if I miss a dose? It is important not to miss your dose. Call your doctor or health care professional if you are unable to keep an appointment. What may interact with this medicine?  medicines that treat or prevent blood clots like warfarin, enoxaparin, dalteparin, apixaban, dabigatran, and rivaroxaban This list may not describe all possible interactions. Give your health care provider a list of all the medicines, herbs, non-prescription drugs, or dietary supplements you use. Also tell them if you smoke, drink alcohol, or use illegal drugs. Some items may interact with your medicine. What should I watch for while using this medicine? Your condition will be monitored carefully while you are receiving this medicine. You will need important blood work done while you are taking  this medicine. Do not become pregnant while taking this medicine or for at least 1 year after stopping it. Women of child-bearing potential will need to have a negative pregnancy test before starting this medicine. Women should inform their doctor if they wish to become pregnant or think they might be pregnant. There is a potential for serious side effects to an unborn child. Men should inform their doctors if they wish to father a child. This medicine may lower sperm counts. Talk to your health care professional or pharmacist for more information. Do not breast-feed an infant while taking this medicine or for 1 year after the last dose. What side effects may I notice from receiving this medicine? Side effects that you should report to your doctor or health care professional as soon as possible:  allergic reactions like skin rash, itching or hives, swelling of the face, lips, or tongue  feeling faint or lightheaded, falls  pain, tingling, numbness, or weakness in the legs  signs and symptoms of infection like fever or chills; cough; flu-like symptoms; sore throat  vaginal bleeding Side effects that usually do not require medical attention (report to your doctor or health care professional if they continue or are bothersome):  aches, pains  constipation  diarrhea  headache  hot flashes  nausea, vomiting  pain at site where injected  stomach pain This list may not describe all possible side effects. Call your doctor for medical advice about side effects. You may report side effects to FDA at 1-800-FDA-1088. Where should I keep my medicine? This drug is given in a hospital or clinic and will   not be stored at home. NOTE: This sheet is a summary. It may not cover all possible information. If you have questions about this medicine, talk to your doctor, pharmacist, or health care provider.  2020 [employer]/Gold Standard (2018-01-08 11:34:41)  

## 2020-02-02 ENCOUNTER — Ambulatory Visit: Payer: BLUE CROSS/BLUE SHIELD

## 2020-02-02 ENCOUNTER — Other Ambulatory Visit: Payer: BLUE CROSS/BLUE SHIELD

## 2020-02-02 ENCOUNTER — Ambulatory Visit: Payer: BLUE CROSS/BLUE SHIELD | Admitting: Oncology

## 2020-02-09 ENCOUNTER — Inpatient Hospital Stay (HOSPITAL_BASED_OUTPATIENT_CLINIC_OR_DEPARTMENT_OTHER): Payer: Medicare Other | Admitting: Oncology

## 2020-02-09 ENCOUNTER — Other Ambulatory Visit: Payer: Self-pay

## 2020-02-09 ENCOUNTER — Inpatient Hospital Stay: Payer: Medicare Other

## 2020-02-09 ENCOUNTER — Inpatient Hospital Stay: Payer: Medicare Other | Attending: Oncology

## 2020-02-09 VITALS — BP 143/74 | HR 73 | Temp 98.2°F | Resp 18 | Ht 66.0 in | Wt 137.2 lb

## 2020-02-09 VITALS — BP 142/85 | HR 79 | Temp 97.1°F | Resp 17

## 2020-02-09 DIAGNOSIS — C50411 Malignant neoplasm of upper-outer quadrant of right female breast: Secondary | ICD-10-CM | POA: Insufficient documentation

## 2020-02-09 DIAGNOSIS — C50911 Malignant neoplasm of unspecified site of right female breast: Secondary | ICD-10-CM

## 2020-02-09 DIAGNOSIS — Z5111 Encounter for antineoplastic chemotherapy: Secondary | ICD-10-CM | POA: Insufficient documentation

## 2020-02-09 DIAGNOSIS — C50811 Malignant neoplasm of overlapping sites of right female breast: Secondary | ICD-10-CM

## 2020-02-09 DIAGNOSIS — C3410 Malignant neoplasm of upper lobe, unspecified bronchus or lung: Secondary | ICD-10-CM

## 2020-02-09 DIAGNOSIS — Z170 Estrogen receptor positive status [ER+]: Secondary | ICD-10-CM

## 2020-02-09 LAB — CBC WITH DIFFERENTIAL/PLATELET
Abs Immature Granulocytes: 0.03 10*3/uL (ref 0.00–0.07)
Basophils Absolute: 0 10*3/uL (ref 0.0–0.1)
Basophils Relative: 1 %
Eosinophils Absolute: 0 10*3/uL (ref 0.0–0.5)
Eosinophils Relative: 0 %
HCT: 40.5 % (ref 36.0–46.0)
Hemoglobin: 13.1 g/dL (ref 12.0–15.0)
Immature Granulocytes: 0 %
Lymphocytes Relative: 22 %
Lymphs Abs: 1.6 10*3/uL (ref 0.7–4.0)
MCH: 29.1 pg (ref 26.0–34.0)
MCHC: 32.3 g/dL (ref 30.0–36.0)
MCV: 90 fL (ref 80.0–100.0)
Monocytes Absolute: 0.3 10*3/uL (ref 0.1–1.0)
Monocytes Relative: 5 %
Neutro Abs: 5.2 10*3/uL (ref 1.7–7.7)
Neutrophils Relative %: 72 %
Platelets: 212 10*3/uL (ref 150–400)
RBC: 4.5 MIL/uL (ref 3.87–5.11)
RDW: 13.1 % (ref 11.5–15.5)
WBC: 7.2 10*3/uL (ref 4.0–10.5)
nRBC: 0 % (ref 0.0–0.2)

## 2020-02-09 LAB — COMPREHENSIVE METABOLIC PANEL
ALT: 31 U/L (ref 0–44)
AST: 26 U/L (ref 15–41)
Albumin: 4.6 g/dL (ref 3.5–5.0)
Alkaline Phosphatase: 77 U/L (ref 38–126)
Anion gap: 7 (ref 5–15)
BUN: 16 mg/dL (ref 8–23)
CO2: 30 mmol/L (ref 22–32)
Calcium: 10.3 mg/dL (ref 8.9–10.3)
Chloride: 104 mmol/L (ref 98–111)
Creatinine, Ser: 0.72 mg/dL (ref 0.44–1.00)
GFR calc Af Amer: >60 mL/min (ref >60)
GFR calc non Af Amer: >60 mL/min (ref >60)
Glucose, Bld: 83 mg/dL (ref 70–99)
Potassium: 4.4 mmol/L (ref 3.5–5.1)
Sodium: 141 mmol/L (ref 135–145)
Total Bilirubin: 0.4 mg/dL (ref 0.3–1.2)
Total Protein: 7 g/dL (ref 6.5–8.1)

## 2020-02-09 MED ORDER — FULVESTRANT 250 MG/5ML IM SOLN
INTRAMUSCULAR | Status: AC
  Filled 2020-02-09: qty 5

## 2020-02-09 MED ORDER — FULVESTRANT 250 MG/5ML IM SOLN
500.0000 mg | Freq: Once | INTRAMUSCULAR | Status: AC
  Administered 2020-02-09: 11:00:00 500 mg via INTRAMUSCULAR

## 2020-02-09 NOTE — Patient Instructions (Signed)
Fulvestrant injection What is this medicine? FULVESTRANT (ful VES trant) blocks the effects of estrogen. It is used to treat breast cancer. This medicine may be used for other purposes; ask your health care provider or pharmacist if you have questions. COMMON BRAND NAME(S): FASLODEX What should I tell my health care provider before I take this medicine? They need to know if you have any of these conditions:  bleeding disorders  liver disease  low blood counts, like low white cell, platelet, or red cell counts  an unusual or allergic reaction to fulvestrant, other medicines, foods, dyes, or preservatives  pregnant or trying to get pregnant  breast-feeding How should I use this medicine? This medicine is for injection into a muscle. It is usually given by a health care professional in a hospital or clinic setting. Talk to your pediatrician regarding the use of this medicine in children. Special care may be needed. Overdosage: If you think you have taken too much of this medicine contact a poison control center or emergency room at once. NOTE: This medicine is only for you. Do not share this medicine with others. What if I miss a dose? It is important not to miss your dose. Call your doctor or health care professional if you are unable to keep an appointment. What may interact with this medicine?  medicines that treat or prevent blood clots like warfarin, enoxaparin, dalteparin, apixaban, dabigatran, and rivaroxaban This list may not describe all possible interactions. Give your health care provider a list of all the medicines, herbs, non-prescription drugs, or dietary supplements you use. Also tell them if you smoke, drink alcohol, or use illegal drugs. Some items may interact with your medicine. What should I watch for while using this medicine? Your condition will be monitored carefully while you are receiving this medicine. You will need important blood work done while you are taking  this medicine. Do not become pregnant while taking this medicine or for at least 1 year after stopping it. Women of child-bearing potential will need to have a negative pregnancy test before starting this medicine. Women should inform their doctor if they wish to become pregnant or think they might be pregnant. There is a potential for serious side effects to an unborn child. Men should inform their doctors if they wish to father a child. This medicine may lower sperm counts. Talk to your health care professional or pharmacist for more information. Do not breast-feed an infant while taking this medicine or for 1 year after the last dose. What side effects may I notice from receiving this medicine? Side effects that you should report to your doctor or health care professional as soon as possible:  allergic reactions like skin rash, itching or hives, swelling of the face, lips, or tongue  feeling faint or lightheaded, falls  pain, tingling, numbness, or weakness in the legs  signs and symptoms of infection like fever or chills; cough; flu-like symptoms; sore throat  vaginal bleeding Side effects that usually do not require medical attention (report to your doctor or health care professional if they continue or are bothersome):  aches, pains  constipation  diarrhea  headache  hot flashes  nausea, vomiting  pain at site where injected  stomach pain This list may not describe all possible side effects. Call your doctor for medical advice about side effects. You may report side effects to FDA at 1-800-FDA-1088. Where should I keep my medicine? This drug is given in a hospital or clinic and will   not be stored at home. NOTE: This sheet is a summary. It may not cover all possible information. If you have questions about this medicine, talk to your doctor, pharmacist, or health care provider.  2020 [employer]/Gold Standard (2018-01-08 11:34:41)  

## 2020-02-09 NOTE — Progress Notes (Signed)
[name]  [phone] [phone]) [phone]     ID: Crystal [name]   DOB: 1956-03-29  MR#: [id number]  GUR#:[id number]  Patient Care Team: Seward Carol, MD as PCP - General (Internal Medicine) Ozil Stettler, Virgie Dad, MD as Consulting Physician (Oncology) Aloha Gell, MD as Consulting Physician (Obstetrics and Gynecology) [name], MD as Consulting Physician (General Surgery) Garlan Fair, MD as Consulting Physician (Gastroenterology) Belva Crome, MD as Consulting Physician (Cardiology) OTHER MD: Arnoldo Hooker Th[place]ma[place]]   CHIEF COMPLAINT: recurrent estrogen receptor positive breast cancer (s/p right mastectomy); pT1a lung cancer  CURRENT TREATMENT: Fulvestrant, zoledronate   INTERVAL HISTORY: Crystal [name] returns today for follow-up and treatment of her estrogen receptor positive breast cancer, and also for follow-up of her lung cancer.   She continues on monthly fulvestrant.  She tolerates this with no significant complications.  She also receives zoledronate every 6 months, with her most recent dose on 09/15/2019. She experienced significant bone pain and some febrile chills with the first dose, but did better with the second.  Her next dose will be in May and then her last dose will be in November.  Since her last visit, she received both doses of the [place] Covid-19 vaccine-- on 2/25 and 3/18.  She had her most recent mammogram at [facility] 09/14/2019 showing breast density category B.  There was no evidence of malignancy.   REVIEW OF SYSTEMS: Crystal [name] finally has sold her old house.  They are settling into [place].  She has joined the first [employer] there and likes it.  She is doing a great deal of gardening and walks between 16 and 20,000 steps a day.  Her daughter did have the COVID-19 illness but did well, except for loss of smell which is only partially back.  Otherwise a detailed review of systems today was stable   BREAST CANCER HISTORY:    From the original intake note:  The patient had bilateral diagnostic mammography 02/12/2011.  She has a history of cystic changes noted on prior studies in April 2011 and April 2010.  On the Feb 12, 2011 study there were new calcifications identified superiorly in the right breast, without any obvious mass.  The patient was brought back for further studies, 02/19/2011 and Dr. Marcelo Baldy was able to localize the calcifications under stereotactic guidance.  The biopsy ([id number]) showed high-grade ductal carcinoma with papillary features which was 70% estrogen receptor positive, 0% progesterone receptor positive.  There was no definitive invasion and therefore further studies were not performed.   Bilateral breast MRIs were obtained Feb 26, 2011.  This showed marked nodular enhancement throughout both breasts, and in the upper central portion there was a post biopsy area of change measuring up to 3.7 cm.  Most of this is a fluid cavity with a thin rim of enhancement.  There were no suspicious areas in the left breast.  No internal mammary or axillary lymph nodes.  Of course, the patient has a history of hepatic adenomas and these were noted incidentally on the MRI.   Her definitive surgical treatment and subsequent history are as detailed below.   PAST MEDICAL HISTORY: Past Medical History:  Diagnosis Date  . Anxiety   . Cancer ([place])    right breast  . Complication of anesthesia    hard to wake up  . GERD (gastroesophageal reflux disease)   . History of small bowel obstruction   . Insomnia   . Liver masses   history of small-bowel obstruction with partial  colectomy March 2001.  The patient has a history of hepatic adenomas.  She has had liver resections x2.  The data for this is at [facility] and we will try to obtain it.  She has also had incisional hernia repair.  She has a history of osteopenia, and she has a history of multiple anti-red cell antibodies making a transfusion difficult.  The patient  tells me that she has a history of low calcium and a tendency to get liver injections.   PAST SURGICAL HISTORY: Past Surgical History:  Procedure Laterality Date  . BREAST SURGERY Right   . HERNIA REPAIR     incisional hernia repair x3  . laparotomies    . LIVER SURGERY    . LOBECTOMY Right 11/29/2015   Procedure: Completion Right Upper  LOBECTOMY, Lymph node dissection, on Q placement;  Surgeon: Grace Isaac, MD;  Location: [facility];  Service: Thoracic;  Laterality: Right;  . MASTECTOMY W/ SENTINEL NODE BIOPSY Right 07/05/2015   Procedure: RIGHT MASTECTOMY WITH SENTINEL NODE BIOPSY AND PORT A CATH INSERTION;  Surgeon: [name], MD;  Location: [facility];  Service: General;  Laterality: Right;  . PORTACATH PLACEMENT Left 07/05/2015   Procedure: INSERTION PORT-A-CATH;  Surgeon: [name], MD;  Location: [facility];  Service: General;  Laterality: Left;  Marland Kitchen VIDEO ASSISTED THORACOSCOPY (VATS)/WEDGE RESECTION Right 11/29/2015   Procedure: BRONCHOSCOPY , VIDEO ASSISTED THORACOSCOPY (VATS)/WEDGE RESECTION;  Surgeon: Grace Isaac, MD;  Location: [facility];  Service: Thoracic;  Laterality: Right;    FAMILY HISTORY: Family History  Problem Relation Age of Onset  . Ovarian cancer Mother 20       "metastasized to breasts"  . Breast cancer Maternal Grandmother 48       2-3 recurrences  . Stomach cancer Maternal Grandfather        dx. 47-49; metastatic stomach cancer  . Colon polyps Father        about 2-3 polyps removed at each colonoscopy  . Colon polyps Brother        "a few"  . Other Maternal Aunt        +TAH  . Colon cancer Paternal Aunt 90  . Brain cancer Daughter 17       astrocytoma  . Breast cancer Cousin        dx. 35s   The patient's mother died from ovarian cancer at the age of 5.  The patient's father is alive at age 23.  She has 2 brothers, no sisters.  A maternal grandmother was diagnosed with breast cancer at the age of 62, and  the maternal grandfather had stomach cancer at the age of 50.   GYNECOLOGIC HISTORY: The patient had menarche at age 38.  Her last menstrual period was in 2005.  She never used hormone replacement.  She used birth control briefly to control bleeding prior to 1980.  She was 64 years old at the birth of her first child.  She started having mammograms at age 61 because of a strong family history.   SOCIAL HISTORY: Crystal [name] worked as a Programmer, applications for [employer].   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  Son, Crystal [name], lives in [place] and works for that [employer]  Daughter, Crystal [name], lives in [place] and is a homemaker. The patient has moved to [place] but is continuing to receive  her oncology care here until "graduation" which should be late 2021  ADVANCED DIRECTIVES: in place   HEALTH MAINTENANCE: Social History   Tobacco Use  . Smoking status: Never Smoker  . Smokeless tobacco: Never Used  Substance Use Topics  . Alcohol use: Yes    Alcohol/week: 1.0 standard drinks    Types: 1 Glasses of wine per week    Comment: social  . Drug use: No    Colonoscopy: 2014  PAP:  Bone density:   Lipid panel:  Allergies  Allergen Reactions  . Promethazine Hcl Other (See Comments)    Causes shaking  . Tylenol [Acetaminophen] Other (See Comments)    Avoids Tylenol products due to liver disease   . Penicillins Rash    Current Outpatient Medications  Medication Sig Dispense Refill  . b complex vitamins tablet Take 1 tablet by mouth daily.      . calcium-vitamin D (OSCAL WITH D) 500-200 MG-UNIT per tablet Take 2 tablets by mouth daily.    . famotidine (PEPCID AC) 10 MG chewable tablet Chew 10 mg by mouth daily as needed for heartburn.    . fulvestrant (FASLODEX) 250 MG/5ML injection Inject 500 mg into the muscle every 30 (thirty) days. One injection each  buttock over 1-2 minutes. Warm prior to use.    Marland Kitchen glucosamine-chondroitin 500-400 MG tablet Take 1 tablet by mouth 3 (three) times daily.      Marland Kitchen ibuprofen (ADVIL,MOTRIN) 200 MG tablet Take 200 mg by mouth every 6 (six) hours as needed for fever or mild pain.     . Multiple Vitamins-Minerals (MULTIVITAMIN WITH MINERALS) tablet Take 1 tablet by mouth daily.      . Omega-3 Fatty Acids (FISH OIL) 500 MG CAPS Take 1,000 mg by mouth daily.    . Turmeric 500 MG TABS Take 1,000 mg by mouth daily.     No current facility-administered medications for this visit.    OBJECTIVE: white woman who appears younger than stated age  64:   02/09/20 1352  BP: (!) 143/74  Pulse: 73  Resp: 18  Temp: 98.2 F (36.8 C)  SpO2: 100%     Body mass index is 22.14 kg/m.    ECOG FS: 1  Sclerae unicteric, EOMs intact Wearing a mask No cervical or supraclavicular adenopathy Lungs no rales or rhonchi Heart regular rate and rhythm Abd soft, nontender, positive bowel sounds MSK no focal spinal tenderness, no upper extremity lymphedema Neuro: nonfocal, well oriented, appropriate affect Breasts: Status post right mastectomy with no evidence of local recurrence.  The left breast is benign.  Both axillae are benign.   LAB RESULTS: Lab Results  Component Value Date   WBC 7.2 02/09/2020   NEUTROABS 5.2 02/09/2020   HGB 13.1 02/09/2020   HCT 40.5 02/09/2020   MCV 90.0 02/09/2020   PLT 212 02/09/2020      Chemistry      Component Value Date/Time   NA 141 02/09/2020 1038   NA 140 09/29/2017 0830   K 4.4 02/09/2020 1038   K 4.6 09/29/2017 0830   CL 104 02/09/2020 1038   CL 102 03/15/2013 0912   CO2 30 02/09/2020 1038   CO2 25 09/29/2017 0830   BUN 16 02/09/2020 1038   BUN 17.4 09/29/2017 0830   CREATININE 0.72 02/09/2020 1038   CREATININE 0.81 12/15/2019 1049   CREATININE 0.8 09/29/2017 0830      Component Value Date/Time   CALCIUM 10.3 02/09/2020 1038   CALCIUM 9.8 09/29/2017 0830  ALKPHOS 77  02/09/2020 1038   ALKPHOS 123 09/29/2017 0830   AST 26 02/09/2020 1038   AST 20 12/15/2019 1049   AST 22 09/29/2017 0830   ALT 31 02/09/2020 1038   ALT 26 12/15/2019 1049   ALT 32 09/29/2017 0830   BILITOT 0.4 02/09/2020 1038   BILITOT 0.3 12/15/2019 1049   BILITOT 0.26 09/29/2017 0830       Lab Results  Component Value Date   LABCA2 28 10/20/2012    No components found for: [id number]  No results for input(s): INR in the last 168 hours.  Urinalysis    Component Value Date/Time   COLORURINE YELLOW 11/27/2015 1403    STUDIES: No results found.    ASSESSMENT: 64 y.o.  BRCA 1-2 and p-53 negative Marc Morgans, [place] woman status post right lumpectomy and sentinel lymph node sampling June 2012 for a T1a N0, Stage IA  invasive ductal carcinoma, grade 3, estrogen receptor 98% positive, progesterone receptor 97% positive, with an MIB-1 of 5% and no HER-2 amplification   (a) note that the initial Right breast biopsy showed DCIS w papillary architecture, estrogen receptor 70% positive, progesterone receptor negative  (1) completed radiation September 2012.   (2) on letrozole starting July 2012, discontinued August 2016 with recurrence   RECURRENT DISEASE: (3) right breast upper outer quadrant biopsy 05/23/2015 was positive for a clinical T1c N0, stage IA invasive ductal carcinoma, grade 2, estrogen receptor 60% positive with moderate staining intensity, progesterone receptor negative, with no HER-2 amplification and an MIB-1 of 90%.  (4) status post right simple mastectomy 07/05/2015 for a pT1c pN0, stage IA invasive ductal carcinoma, grade 3, with negative margins. 4 lymph nodes removed with mastectomy were benign.  (a) the patient is not planning on reconstruction  (5) began cyclophosphamide and docetaxel 07/26/2015, repeated every 21 days 4, final dose 09/26/2015  (6) fulvestrant started 10/23/2015  (7) genetics testing 07/20/2015 found no deleterious mutation in ATM, BARD1,  BRCA1, BRCA2, BRIP1, CDH1, CHEK2, FANCC, MLH1, MSH2, MSH6, NBN, PALB2, PMS2, PTEN, RAD51C, RAD51D, TP53, and XRCC2. Additionally no variants of uncertain significance were found.   LUNG CANCER: (8) RUL spiculated nodule measuring 1.4 cm noted on staging CT scan 06/13/2015-- stable on repeat CT 07/25/15--moderately hot and slightly larger on PET 10/31/2015  (a) status post right upper lobectomy and lymph node dissection 11/29/2015 for a pT1a pN0, stage IA primary lung adenocarcinoma (estrogen, progesterone, and gross cystic disease fluid protein negative, strongly Napsin-A and TTF-1 positive), low-grade, with negative margins  (a) annual chest CT, most recently 08/10/2019, stable   (9) history of multiple hepatic adenomas  (a) not a candidate for tamoxifen  (10) osteopenia:  (a) bone density 03/14/2016 showed a T score of -2.1.  (b) zoledronate started December 2019   PLAN: Crystal [name] is now 4-1/2 years out from definitive surgery for her breast cancer recurrence.  There is no evidence of disease activity and this is very favorable.  She continues on fulvestrant every 28 days and she receives this through our [facility] office.  She will continue with these, the last dose being November 10.  After that she will be ready to "graduate".  We will obtain a mammogram at [facility] the day before and a CT scan at [facility] the day before and that incidentally will be the last CT scan for her lung cancer since it will be 5 years on that also  In short she is going to "graduate" when she sees me in November.  She knows to call for any other issue that may develop before then.  Total encounter time 25 minutes.*  Dianne Bady, Virgie Dad, MD  02/09/20 2:07 PM Medical Oncology and Hematology [facility] [place], [place] [address] Tel. [phone]    Fax. [phone]   I, Wilburn Mylar, am acting as scribe for Dr. Virgie Dad. Zeth Buday.  I, Lurline Del MD, have reviewed  the above documentation for accuracy and completeness, and I agree with the above.   *Total Encounter Time as defined by the Centers for Medicare and Medicaid Services includes, in addition to the face-to-face time of a patient visit (documented in the note above) non-face-to-face time: obtaining and reviewing outside history, ordering and reviewing medications, tests or procedures, care coordination (communications with other health care professionals or caregivers) and documentation in the medical record.

## 2020-02-11 ENCOUNTER — Telehealth: Payer: Self-pay | Admitting: Oncology

## 2020-02-11 NOTE — Telephone Encounter (Signed)
Spoke with patient to confirm lab/infusion/inj appts per 4/28 LOS

## 2020-03-08 ENCOUNTER — Other Ambulatory Visit: Payer: Self-pay

## 2020-03-08 ENCOUNTER — Inpatient Hospital Stay: Payer: Medicare Other | Attending: Oncology

## 2020-03-08 ENCOUNTER — Inpatient Hospital Stay: Payer: Medicare Other

## 2020-03-08 VITALS — BP 131/50 | HR 67 | Temp 97.5°F | Resp 17

## 2020-03-08 DIAGNOSIS — C50911 Malignant neoplasm of unspecified site of right female breast: Secondary | ICD-10-CM

## 2020-03-08 DIAGNOSIS — C50811 Malignant neoplasm of overlapping sites of right female breast: Secondary | ICD-10-CM

## 2020-03-08 DIAGNOSIS — Z170 Estrogen receptor positive status [ER+]: Secondary | ICD-10-CM

## 2020-03-08 DIAGNOSIS — Z5111 Encounter for antineoplastic chemotherapy: Secondary | ICD-10-CM | POA: Insufficient documentation

## 2020-03-08 DIAGNOSIS — C50411 Malignant neoplasm of upper-outer quadrant of right female breast: Secondary | ICD-10-CM | POA: Diagnosis present

## 2020-03-08 DIAGNOSIS — M8580 Other specified disorders of bone density and structure, unspecified site: Secondary | ICD-10-CM | POA: Insufficient documentation

## 2020-03-08 LAB — CBC WITH DIFFERENTIAL/PLATELET
Abs Immature Granulocytes: 0.01 10*3/uL (ref 0.00–0.07)
Basophils Absolute: 0 10*3/uL (ref 0.0–0.1)
Basophils Relative: 1 %
Eosinophils Absolute: 0.1 10*3/uL (ref 0.0–0.5)
Eosinophils Relative: 2 %
HCT: 42.5 % (ref 36.0–46.0)
Hemoglobin: 13.9 g/dL (ref 12.0–15.0)
Immature Granulocytes: 0 %
Lymphocytes Relative: 31 %
Lymphs Abs: 1.8 10*3/uL (ref 0.7–4.0)
MCH: 29.4 pg (ref 26.0–34.0)
MCHC: 32.7 g/dL (ref 30.0–36.0)
MCV: 89.9 fL (ref 80.0–100.0)
Monocytes Absolute: 0.3 10*3/uL (ref 0.1–1.0)
Monocytes Relative: 5 %
Neutro Abs: 3.5 10*3/uL (ref 1.7–7.7)
Neutrophils Relative %: 61 %
Platelets: 230 10*3/uL (ref 150–400)
RBC: 4.73 MIL/uL (ref 3.87–5.11)
RDW: 13.1 % (ref 11.5–15.5)
WBC: 5.7 10*3/uL (ref 4.0–10.5)
nRBC: 0 % (ref 0.0–0.2)

## 2020-03-08 LAB — COMPREHENSIVE METABOLIC PANEL
ALT: 31 U/L (ref 0–44)
AST: 27 U/L (ref 15–41)
Albumin: 4.7 g/dL (ref 3.5–5.0)
Alkaline Phosphatase: 65 U/L (ref 38–126)
Anion gap: 10 (ref 5–15)
BUN: 16 mg/dL (ref 8–23)
CO2: 26 mmol/L (ref 22–32)
Calcium: 9.9 mg/dL (ref 8.9–10.3)
Chloride: 103 mmol/L (ref 98–111)
Creatinine, Ser: 0.68 mg/dL (ref 0.44–1.00)
GFR calc Af Amer: >60 mL/min (ref >60)
GFR calc non Af Amer: >60 mL/min (ref >60)
Glucose, Bld: 79 mg/dL (ref 70–99)
Potassium: 4.1 mmol/L (ref 3.5–5.1)
Sodium: 139 mmol/L (ref 135–145)
Total Bilirubin: 0.4 mg/dL (ref 0.3–1.2)
Total Protein: 7.5 g/dL (ref 6.5–8.1)

## 2020-03-08 MED ORDER — FULVESTRANT 250 MG/5ML IM SOLN
500.0000 mg | Freq: Once | INTRAMUSCULAR | Status: AC
  Administered 2020-03-08: 500 mg via INTRAMUSCULAR

## 2020-03-08 MED ORDER — ZOLEDRONIC ACID 4 MG/100ML IV SOLN
4.0000 mg | Freq: Once | INTRAVENOUS | Status: AC
  Administered 2020-03-08: 4 mg via INTRAVENOUS
  Filled 2020-03-08: qty 100

## 2020-03-08 NOTE — Patient Instructions (Signed)
Zoledronic Acid injection (Hypercalcemia, Oncology) What is this medicine? ZOLEDRONIC ACID (ZOE le dron ik AS id) lowers the amount of calcium loss from bone. It is used to treat too much calcium in your blood from cancer. It is also used to prevent complications of cancer that has spread to the bone. This medicine may be used for other purposes; ask your health care provider or pharmacist if you have questions. COMMON BRAND NAME(S): Zometa What should I tell my health care provider before I take this medicine? They need to know if you have any of these conditions:  aspirin-sensitive asthma  cancer, especially if you are receiving medicines used to treat cancer  dental disease or wear dentures  infection  kidney disease  receiving corticosteroids like dexamethasone or prednisone  an unusual or allergic reaction to zoledronic acid, other medicines, foods, dyes, or preservatives  pregnant or trying to get pregnant  breast-feeding How should I use this medicine? This medicine is for infusion into a vein. It is given by a health care professional in a hospital or clinic setting. Talk to your pediatrician regarding the use of this medicine in children. Special care may be needed. Overdosage: If you think you have taken too much of this medicine contact a poison control center or emergency room at once. NOTE: This medicine is only for you. Do not share this medicine with others. What if I miss a dose? It is important not to miss your dose. Call your doctor or health care professional if you are unable to keep an appointment. What may interact with this medicine?  certain antibiotics given by injection  NSAIDs, medicines for pain and inflammation, like ibuprofen or naproxen  some diuretics like bumetanide, furosemide  teriparatide  thalidomide This list may not describe all possible interactions. Give your health care provider a list of all the medicines, herbs, non-prescription  drugs, or dietary supplements you use. Also tell them if you smoke, drink alcohol, or use illegal drugs. Some items may interact with your medicine. What should I watch for while using this medicine? Visit your doctor or health care professional for regular checkups. It may be some time before you see the benefit from this medicine. Do not stop taking your medicine unless your doctor tells you to. Your doctor may order blood tests or other tests to see how you are doing. Women should inform their doctor if they wish to become pregnant or think they might be pregnant. There is a potential for serious side effects to an unborn child. Talk to your health care professional or pharmacist for more information. You should make sure that you get enough calcium and vitamin D while you are taking this medicine. Discuss the foods you eat and the vitamins you take with your health care professional. Some people who take this medicine have severe bone, joint, and/or muscle pain. This medicine may also increase your risk for jaw problems or a broken thigh bone. Tell your doctor right away if you have severe pain in your jaw, bones, joints, or muscles. Tell your doctor if you have any pain that does not go away or that gets worse. Tell your dentist and dental surgeon that you are taking this medicine. You should not have major dental surgery while on this medicine. See your dentist to have a dental exam and fix any dental problems before starting this medicine. Take good care of your teeth while on this medicine. Make sure you see your dentist for regular follow-up  appointments. What side effects may I notice from receiving this medicine? Side effects that you should report to your doctor or health care professional as soon as possible:  allergic reactions like skin rash, itching or hives, swelling of the face, lips, or tongue  anxiety, confusion, or depression  breathing problems  changes in vision  eye  pain  feeling faint or lightheaded, falls  jaw pain, especially after dental work  mouth sores  muscle cramps, stiffness, or weakness  redness, blistering, peeling or loosening of the skin, including inside the mouth  trouble passing urine or change in the amount of urine Side effects that usually do not require medical attention (report to your doctor or health care professional if they continue or are bothersome):  bone, joint, or muscle pain  constipation  diarrhea  fever  hair loss  irritation at site where injected  loss of appetite  nausea, vomiting  stomach upset  trouble sleeping  trouble swallowing  weak or tired This list may not describe all possible side effects. Call your doctor for medical advice about side effects. You may report side effects to FDA at 1-800-FDA-1088. Where should I keep my medicine? This drug is given in a hospital or clinic and will not be stored at home. NOTE: This sheet is a summary. It may not cover all possible information. If you have questions about this medicine, talk to your doctor, pharmacist, or health care provider.  2020 Elsevier/Gold Standard (2014-02-26 14:19:39) Fulvestrant injection What is this medicine? FULVESTRANT (ful VES trant) blocks the effects of estrogen. It is used to treat breast cancer. This medicine may be used for other purposes; ask your health care provider or pharmacist if you have questions. COMMON BRAND NAME(S): FASLODEX What should I tell my health care provider before I take this medicine? They need to know if you have any of these conditions:  bleeding disorders  liver disease  low blood counts, like low white cell, platelet, or red cell counts  an unusual or allergic reaction to fulvestrant, other medicines, foods, dyes, or preservatives  pregnant or trying to get pregnant  breast-feeding How should I use this medicine? This medicine is for injection into a muscle. It is usually  given by a health care professional in a hospital or clinic setting. Talk to your pediatrician regarding the use of this medicine in children. Special care may be needed. Overdosage: If you think you have taken too much of this medicine contact a poison control center or emergency room at once. NOTE: This medicine is only for you. Do not share this medicine with others. What if I miss a dose? It is important not to miss your dose. Call your doctor or health care professional if you are unable to keep an appointment. What may interact with this medicine?  medicines that treat or prevent blood clots like warfarin, enoxaparin, dalteparin, apixaban, dabigatran, and rivaroxaban This list may not describe all possible interactions. Give your health care provider a list of all the medicines, herbs, non-prescription drugs, or dietary supplements you use. Also tell them if you smoke, drink alcohol, or use illegal drugs. Some items may interact with your medicine. What should I watch for while using this medicine? Your condition will be monitored carefully while you are receiving this medicine. You will need important blood work done while you are taking this medicine. Do not become pregnant while taking this medicine or for at least 1 year after stopping it. Women of child-bearing potential  will need to have a negative pregnancy test before starting this medicine. Women should inform their doctor if they wish to become pregnant or think they might be pregnant. There is a potential for serious side effects to an unborn child. Men should inform their doctors if they wish to father a child. This medicine may lower sperm counts. Talk to your health care professional or pharmacist for more information. Do not breast-feed an infant while taking this medicine or for 1 year after the last dose. What side effects may I notice from receiving this medicine? Side effects that you should report to your doctor or health care  professional as soon as possible:  allergic reactions like skin rash, itching or hives, swelling of the face, lips, or tongue  feeling faint or lightheaded, falls  pain, tingling, numbness, or weakness in the legs  signs and symptoms of infection like fever or chills; cough; flu-like symptoms; sore throat  vaginal bleeding Side effects that usually do not require medical attention (report to your doctor or health care professional if they continue or are bothersome):  aches, pains  constipation  diarrhea  headache  hot flashes  nausea, vomiting  pain at site where injected  stomach pain This list may not describe all possible side effects. Call your doctor for medical advice about side effects. You may report side effects to FDA at 1-800-FDA-1088. Where should I keep my medicine? This drug is given in a hospital or clinic and will not be stored at home. NOTE: This sheet is a summary. It may not cover all possible information. If you have questions about this medicine, talk to your doctor, pharmacist, or health care provider.  2020 Elsevier/Gold Standard (2018-01-08 11:34:41)

## 2020-04-05 ENCOUNTER — Inpatient Hospital Stay: Payer: Medicare Other

## 2020-04-05 ENCOUNTER — Inpatient Hospital Stay: Payer: Medicare Other | Attending: Oncology

## 2020-04-05 VITALS — BP 92/72 | HR 63 | Temp 97.6°F | Resp 16

## 2020-04-05 DIAGNOSIS — Z170 Estrogen receptor positive status [ER+]: Secondary | ICD-10-CM

## 2020-04-05 DIAGNOSIS — C50411 Malignant neoplasm of upper-outer quadrant of right female breast: Secondary | ICD-10-CM | POA: Diagnosis present

## 2020-04-05 DIAGNOSIS — Z5111 Encounter for antineoplastic chemotherapy: Secondary | ICD-10-CM | POA: Insufficient documentation

## 2020-04-05 DIAGNOSIS — C50811 Malignant neoplasm of overlapping sites of right female breast: Secondary | ICD-10-CM

## 2020-04-05 DIAGNOSIS — C50911 Malignant neoplasm of unspecified site of right female breast: Secondary | ICD-10-CM

## 2020-04-05 LAB — CBC WITH DIFFERENTIAL/PLATELET
Abs Immature Granulocytes: 0.01 10*3/uL (ref 0.00–0.07)
Basophils Absolute: 0 10*3/uL (ref 0.0–0.1)
Basophils Relative: 1 %
Eosinophils Absolute: 0 10*3/uL (ref 0.0–0.5)
Eosinophils Relative: 0 %
HCT: 42.3 % (ref 36.0–46.0)
Hemoglobin: 13.6 g/dL (ref 12.0–15.0)
Immature Granulocytes: 0 %
Lymphocytes Relative: 32 %
Lymphs Abs: 1.5 10*3/uL (ref 0.7–4.0)
MCH: 29.4 pg (ref 26.0–34.0)
MCHC: 32.2 g/dL (ref 30.0–36.0)
MCV: 91.6 fL (ref 80.0–100.0)
Monocytes Absolute: 0.3 10*3/uL (ref 0.1–1.0)
Monocytes Relative: 7 %
Neutro Abs: 2.9 10*3/uL (ref 1.7–7.7)
Neutrophils Relative %: 60 %
Platelets: 217 10*3/uL (ref 150–400)
RBC: 4.62 MIL/uL (ref 3.87–5.11)
RDW: 13.4 % (ref 11.5–15.5)
WBC: 4.8 10*3/uL (ref 4.0–10.5)
nRBC: 0 % (ref 0.0–0.2)

## 2020-04-05 LAB — COMPREHENSIVE METABOLIC PANEL
ALT: 30 U/L (ref 0–44)
AST: 24 U/L (ref 15–41)
Albumin: 4.5 g/dL (ref 3.5–5.0)
Alkaline Phosphatase: 63 U/L (ref 38–126)
Anion gap: 6 (ref 5–15)
BUN: 17 mg/dL (ref 8–23)
CO2: 29 mmol/L (ref 22–32)
Calcium: 9.8 mg/dL (ref 8.9–10.3)
Chloride: 104 mmol/L (ref 98–111)
Creatinine, Ser: 0.69 mg/dL (ref 0.44–1.00)
GFR calc Af Amer: >60 mL/min (ref >60)
GFR calc non Af Amer: >60 mL/min (ref >60)
Glucose, Bld: 98 mg/dL (ref 70–99)
Potassium: 4.9 mmol/L (ref 3.5–5.1)
Sodium: 139 mmol/L (ref 135–145)
Total Bilirubin: 0.5 mg/dL (ref 0.3–1.2)
Total Protein: 7 g/dL (ref 6.5–8.1)

## 2020-04-05 MED ORDER — FULVESTRANT 250 MG/5ML IM SOLN
500.0000 mg | Freq: Once | INTRAMUSCULAR | Status: AC
  Administered 2020-04-05: 500 mg via INTRAMUSCULAR

## 2020-04-05 MED ORDER — FULVESTRANT 250 MG/5ML IM SOLN
INTRAMUSCULAR | Status: AC
  Filled 2020-04-05: qty 10

## 2020-04-05 NOTE — Patient Instructions (Signed)
Fulvestrant injection What is this medicine? FULVESTRANT (ful VES trant) blocks the effects of estrogen. It is used to treat breast cancer. This medicine may be used for other purposes; ask your health care provider or pharmacist if you have questions. COMMON BRAND NAME(S): FASLODEX What should I tell my health care provider before I take this medicine? They need to know if you have any of these conditions:  bleeding disorders  liver disease  low blood counts, like low white cell, platelet, or red cell counts  an unusual or allergic reaction to fulvestrant, other medicines, foods, dyes, or preservatives  pregnant or trying to get pregnant  breast-feeding How should I use this medicine? This medicine is for injection into a muscle. It is usually given by a health care professional in a hospital or clinic setting. Talk to your pediatrician regarding the use of this medicine in children. Special care may be needed. Overdosage: If you think you have taken too much of this medicine contact a poison control center or emergency room at once. NOTE: This medicine is only for you. Do not share this medicine with others. What if I miss a dose? It is important not to miss your dose. Call your doctor or health care professional if you are unable to keep an appointment. What may interact with this medicine?  medicines that treat or prevent blood clots like warfarin, enoxaparin, dalteparin, apixaban, dabigatran, and rivaroxaban This list may not describe all possible interactions. Give your health care provider a list of all the medicines, herbs, non-prescription drugs, or dietary supplements you use. Also tell them if you smoke, drink alcohol, or use illegal drugs. Some items may interact with your medicine. What should I watch for while using this medicine? Your condition will be monitored carefully while you are receiving this medicine. You will need important blood work done while you are taking  this medicine. Do not become pregnant while taking this medicine or for at least 1 year after stopping it. Women of child-bearing potential will need to have a negative pregnancy test before starting this medicine. Women should inform their doctor if they wish to become pregnant or think they might be pregnant. There is a potential for serious side effects to an unborn child. Men should inform their doctors if they wish to father a child. This medicine may lower sperm counts. Talk to your health care professional or pharmacist for more information. Do not breast-feed an infant while taking this medicine or for 1 year after the last dose. What side effects may I notice from receiving this medicine? Side effects that you should report to your doctor or health care professional as soon as possible:  allergic reactions like skin rash, itching or hives, swelling of the face, lips, or tongue  feeling faint or lightheaded, falls  pain, tingling, numbness, or weakness in the legs  signs and symptoms of infection like fever or chills; cough; flu-like symptoms; sore throat  vaginal bleeding Side effects that usually do not require medical attention (report to your doctor or health care professional if they continue or are bothersome):  aches, pains  constipation  diarrhea  headache  hot flashes  nausea, vomiting  pain at site where injected  stomach pain This list may not describe all possible side effects. Call your doctor for medical advice about side effects. You may report side effects to FDA at 1-800-FDA-1088. Where should I keep my medicine? This drug is given in a hospital or clinic and will   not be stored at home. NOTE: This sheet is a summary. It may not cover all possible information. If you have questions about this medicine, talk to your doctor, pharmacist, or health care provider.  2020 [employer]/Gold Standard (2018-01-08 11:34:41)  

## 2020-05-03 ENCOUNTER — Inpatient Hospital Stay: Payer: Medicare Other

## 2020-05-03 ENCOUNTER — Other Ambulatory Visit: Payer: Self-pay

## 2020-05-03 ENCOUNTER — Inpatient Hospital Stay: Payer: Medicare Other | Attending: Oncology

## 2020-05-03 VITALS — BP 116/49 | HR 67 | Temp 98.8°F | Resp 17

## 2020-05-03 DIAGNOSIS — C50411 Malignant neoplasm of upper-outer quadrant of right female breast: Secondary | ICD-10-CM | POA: Insufficient documentation

## 2020-05-03 DIAGNOSIS — Z170 Estrogen receptor positive status [ER+]: Secondary | ICD-10-CM

## 2020-05-03 DIAGNOSIS — C50811 Malignant neoplasm of overlapping sites of right female breast: Secondary | ICD-10-CM

## 2020-05-03 DIAGNOSIS — C50911 Malignant neoplasm of unspecified site of right female breast: Secondary | ICD-10-CM

## 2020-05-03 DIAGNOSIS — Z5111 Encounter for antineoplastic chemotherapy: Secondary | ICD-10-CM | POA: Diagnosis present

## 2020-05-03 LAB — COMPREHENSIVE METABOLIC PANEL
ALT: 28 U/L (ref 0–44)
AST: 23 U/L (ref 15–41)
Albumin: 4.5 g/dL (ref 3.5–5.0)
Alkaline Phosphatase: 73 U/L (ref 38–126)
Anion gap: 6 (ref 5–15)
BUN: 15 mg/dL (ref 8–23)
CO2: 31 mmol/L (ref 22–32)
Calcium: 9.9 mg/dL (ref 8.9–10.3)
Chloride: 106 mmol/L (ref 98–111)
Creatinine, Ser: 0.84 mg/dL (ref 0.44–1.00)
GFR calc Af Amer: >60 mL/min (ref >60)
GFR calc non Af Amer: >60 mL/min (ref >60)
Glucose, Bld: 105 mg/dL — ABNORMAL HIGH (ref 70–99)
Potassium: 5.2 mmol/L — ABNORMAL HIGH (ref 3.5–5.1)
Sodium: 143 mmol/L (ref 135–145)
Total Bilirubin: 0.4 mg/dL (ref 0.3–1.2)
Total Protein: 6.9 g/dL (ref 6.5–8.1)

## 2020-05-03 LAB — CBC WITH DIFFERENTIAL/PLATELET
Abs Immature Granulocytes: 0.02 10*3/uL (ref 0.00–0.07)
Basophils Absolute: 0 10*3/uL (ref 0.0–0.1)
Basophils Relative: 1 %
Eosinophils Absolute: 0.1 10*3/uL (ref 0.0–0.5)
Eosinophils Relative: 1 %
HCT: 41.6 % (ref 36.0–46.0)
Hemoglobin: 13.3 g/dL (ref 12.0–15.0)
Immature Granulocytes: 0 %
Lymphocytes Relative: 29 %
Lymphs Abs: 1.6 10*3/uL (ref 0.7–4.0)
MCH: 29.3 pg (ref 26.0–34.0)
MCHC: 32 g/dL (ref 30.0–36.0)
MCV: 91.6 fL (ref 80.0–100.0)
Monocytes Absolute: 0.3 10*3/uL (ref 0.1–1.0)
Monocytes Relative: 5 %
Neutro Abs: 3.5 10*3/uL (ref 1.7–7.7)
Neutrophils Relative %: 64 %
Platelets: 187 10*3/uL (ref 150–400)
RBC: 4.54 MIL/uL (ref 3.87–5.11)
RDW: 13.2 % (ref 11.5–15.5)
WBC: 5.5 10*3/uL (ref 4.0–10.5)
nRBC: 0 % (ref 0.0–0.2)

## 2020-05-03 MED ORDER — FULVESTRANT 250 MG/5ML IM SOLN
500.0000 mg | Freq: Once | INTRAMUSCULAR | Status: AC
  Administered 2020-05-03: 500 mg via INTRAMUSCULAR

## 2020-05-03 MED ORDER — FULVESTRANT 250 MG/5ML IM SOLN
INTRAMUSCULAR | Status: AC
  Filled 2020-05-03: qty 5

## 2020-05-03 NOTE — Patient Instructions (Signed)
Fulvestrant injection What is this medicine? FULVESTRANT (ful VES trant) blocks the effects of estrogen. It is used to treat breast cancer. This medicine may be used for other purposes; ask your health care provider or pharmacist if you have questions. COMMON BRAND NAME(S): FASLODEX What should I tell my health care provider before I take this medicine? They need to know if you have any of these conditions:  bleeding disorders  liver disease  low blood counts, like low white cell, platelet, or red cell counts  an unusual or allergic reaction to fulvestrant, other medicines, foods, dyes, or preservatives  pregnant or trying to get pregnant  breast-feeding How should I use this medicine? This medicine is for injection into a muscle. It is usually given by a health care professional in a hospital or clinic setting. Talk to your pediatrician regarding the use of this medicine in children. Special care may be needed. Overdosage: If you think you have taken too much of this medicine contact a poison control center or emergency room at once. NOTE: This medicine is only for you. Do not share this medicine with others. What if I miss a dose? It is important not to miss your dose. Call your doctor or health care professional if you are unable to keep an appointment. What may interact with this medicine?  medicines that treat or prevent blood clots like warfarin, enoxaparin, dalteparin, apixaban, dabigatran, and rivaroxaban This list may not describe all possible interactions. Give your health care provider a list of all the medicines, herbs, non-prescription drugs, or dietary supplements you use. Also tell them if you smoke, drink alcohol, or use illegal drugs. Some items may interact with your medicine. What should I watch for while using this medicine? Your condition will be monitored carefully while you are receiving this medicine. You will need important blood work done while you are taking  this medicine. Do not become pregnant while taking this medicine or for at least 1 year after stopping it. Women of child-bearing potential will need to have a negative pregnancy test before starting this medicine. Women should inform their doctor if they wish to become pregnant or think they might be pregnant. There is a potential for serious side effects to an unborn child. Men should inform their doctors if they wish to father a child. This medicine may lower sperm counts. Talk to your health care professional or pharmacist for more information. Do not breast-feed an infant while taking this medicine or for 1 year after the last dose. What side effects may I notice from receiving this medicine? Side effects that you should report to your doctor or health care professional as soon as possible:  allergic reactions like skin rash, itching or hives, swelling of the face, lips, or tongue  feeling faint or lightheaded, falls  pain, tingling, numbness, or weakness in the legs  signs and symptoms of infection like fever or chills; cough; flu-like symptoms; sore throat  vaginal bleeding Side effects that usually do not require medical attention (report to your doctor or health care professional if they continue or are bothersome):  aches, pains  constipation  diarrhea  headache  hot flashes  nausea, vomiting  pain at site where injected  stomach pain This list may not describe all possible side effects. Call your doctor for medical advice about side effects. You may report side effects to FDA at 1-800-FDA-1088. Where should I keep my medicine? This drug is given in a hospital or clinic and will   not be stored at home. NOTE: This sheet is a summary. It may not cover all possible information. If you have questions about this medicine, talk to your doctor, pharmacist, or health care provider.  2020 [employer]/Gold Standard (2018-01-08 11:34:41)  

## 2020-05-31 ENCOUNTER — Inpatient Hospital Stay: Payer: Medicare Other | Attending: Oncology

## 2020-05-31 ENCOUNTER — Other Ambulatory Visit: Payer: Self-pay

## 2020-05-31 ENCOUNTER — Inpatient Hospital Stay: Payer: Medicare Other

## 2020-05-31 VITALS — BP 137/90 | HR 72 | Temp 98.0°F | Resp 17

## 2020-05-31 DIAGNOSIS — Z5111 Encounter for antineoplastic chemotherapy: Secondary | ICD-10-CM | POA: Insufficient documentation

## 2020-05-31 DIAGNOSIS — C50911 Malignant neoplasm of unspecified site of right female breast: Secondary | ICD-10-CM

## 2020-05-31 DIAGNOSIS — C50811 Malignant neoplasm of overlapping sites of right female breast: Secondary | ICD-10-CM

## 2020-05-31 DIAGNOSIS — C50411 Malignant neoplasm of upper-outer quadrant of right female breast: Secondary | ICD-10-CM | POA: Insufficient documentation

## 2020-05-31 LAB — CBC WITH DIFFERENTIAL/PLATELET
Abs Immature Granulocytes: 0.02 10*3/uL (ref 0.00–0.07)
Basophils Absolute: 0 10*3/uL (ref 0.0–0.1)
Basophils Relative: 1 %
Eosinophils Absolute: 0 10*3/uL (ref 0.0–0.5)
Eosinophils Relative: 0 %
HCT: 40.7 % (ref 36.0–46.0)
Hemoglobin: 13.1 g/dL (ref 12.0–15.0)
Immature Granulocytes: 0 %
Lymphocytes Relative: 30 %
Lymphs Abs: 1.8 10*3/uL (ref 0.7–4.0)
MCH: 29.3 pg (ref 26.0–34.0)
MCHC: 32.2 g/dL (ref 30.0–36.0)
MCV: 91.1 fL (ref 80.0–100.0)
Monocytes Absolute: 0.3 10*3/uL (ref 0.1–1.0)
Monocytes Relative: 5 %
Neutro Abs: 3.7 10*3/uL (ref 1.7–7.7)
Neutrophils Relative %: 64 %
Platelets: 188 10*3/uL (ref 150–400)
RBC: 4.47 MIL/uL (ref 3.87–5.11)
RDW: 13.1 % (ref 11.5–15.5)
WBC: 5.9 10*3/uL (ref 4.0–10.5)
nRBC: 0 % (ref 0.0–0.2)

## 2020-05-31 LAB — COMPREHENSIVE METABOLIC PANEL
ALT: 24 U/L (ref 0–44)
AST: 21 U/L (ref 15–41)
Albumin: 4.4 g/dL (ref 3.5–5.0)
Alkaline Phosphatase: 82 U/L (ref 38–126)
Anion gap: 7 (ref 5–15)
BUN: 14 mg/dL (ref 8–23)
CO2: 28 mmol/L (ref 22–32)
Calcium: 9.6 mg/dL (ref 8.9–10.3)
Chloride: 104 mmol/L (ref 98–111)
Creatinine, Ser: 0.67 mg/dL (ref 0.44–1.00)
GFR calc Af Amer: >60 mL/min (ref >60)
GFR calc non Af Amer: >60 mL/min (ref >60)
Glucose, Bld: 101 mg/dL — ABNORMAL HIGH (ref 70–99)
Potassium: 4.1 mmol/L (ref 3.5–5.1)
Sodium: 139 mmol/L (ref 135–145)
Total Bilirubin: 0.3 mg/dL (ref 0.3–1.2)
Total Protein: 7.1 g/dL (ref 6.5–8.1)

## 2020-05-31 MED ORDER — FULVESTRANT 250 MG/5ML IM SOLN
INTRAMUSCULAR | Status: AC
  Filled 2020-05-31: qty 5

## 2020-05-31 MED ORDER — FULVESTRANT 250 MG/5ML IM SOLN
500.0000 mg | Freq: Once | INTRAMUSCULAR | Status: AC
  Administered 2020-05-31: 500 mg via INTRAMUSCULAR

## 2020-05-31 NOTE — Patient Instructions (Signed)
Fulvestrant injection What is this medicine? FULVESTRANT (ful VES trant) blocks the effects of estrogen. It is used to treat breast cancer. This medicine may be used for other purposes; ask your health care provider or pharmacist if you have questions. COMMON BRAND NAME(S): FASLODEX What should I tell my health care provider before I take this medicine? They need to know if you have any of these conditions:  bleeding disorders  liver disease  low blood counts, like low white cell, platelet, or red cell counts  an unusual or allergic reaction to fulvestrant, other medicines, foods, dyes, or preservatives  pregnant or trying to get pregnant  breast-feeding How should I use this medicine? This medicine is for injection into a muscle. It is usually given by a health care professional in a hospital or clinic setting. Talk to your pediatrician regarding the use of this medicine in children. Special care may be needed. Overdosage: If you think you have taken too much of this medicine contact a poison control center or emergency room at once. NOTE: This medicine is only for you. Do not share this medicine with others. What if I miss a dose? It is important not to miss your dose. Call your doctor or health care professional if you are unable to keep an appointment. What may interact with this medicine?  medicines that treat or prevent blood clots like warfarin, enoxaparin, dalteparin, apixaban, dabigatran, and rivaroxaban This list may not describe all possible interactions. Give your health care provider a list of all the medicines, herbs, non-prescription drugs, or dietary supplements you use. Also tell them if you smoke, drink alcohol, or use illegal drugs. Some items may interact with your medicine. What should I watch for while using this medicine? Your condition will be monitored carefully while you are receiving this medicine. You will need important blood work done while you are taking  this medicine. Do not become pregnant while taking this medicine or for at least 1 year after stopping it. Women of child-bearing potential will need to have a negative pregnancy test before starting this medicine. Women should inform their doctor if they wish to become pregnant or think they might be pregnant. There is a potential for serious side effects to an unborn child. Men should inform their doctors if they wish to father a child. This medicine may lower sperm counts. Talk to your health care professional or pharmacist for more information. Do not breast-feed an infant while taking this medicine or for 1 year after the last dose. What side effects may I notice from receiving this medicine? Side effects that you should report to your doctor or health care professional as soon as possible:  allergic reactions like skin rash, itching or hives, swelling of the face, lips, or tongue  feeling faint or lightheaded, falls  pain, tingling, numbness, or weakness in the legs  signs and symptoms of infection like fever or chills; cough; flu-like symptoms; sore throat  vaginal bleeding Side effects that usually do not require medical attention (report to your doctor or health care professional if they continue or are bothersome):  aches, pains  constipation  diarrhea  headache  hot flashes  nausea, vomiting  pain at site where injected  stomach pain This list may not describe all possible side effects. Call your doctor for medical advice about side effects. You may report side effects to FDA at 1-800-FDA-1088. Where should I keep my medicine? This drug is given in a hospital or clinic and will   not be stored at home. NOTE: This sheet is a summary. It may not cover all possible information. If you have questions about this medicine, talk to your doctor, pharmacist, or health care provider.  2020 [employer]/Gold Standard (2018-01-08 11:34:41)  

## 2020-06-28 ENCOUNTER — Inpatient Hospital Stay: Payer: Medicare Other | Attending: Oncology

## 2020-06-28 ENCOUNTER — Inpatient Hospital Stay: Payer: Medicare Other

## 2020-06-28 ENCOUNTER — Other Ambulatory Visit: Payer: Self-pay

## 2020-06-28 VITALS — BP 121/68 | HR 63 | Temp 98.8°F | Resp 17

## 2020-06-28 DIAGNOSIS — Z170 Estrogen receptor positive status [ER+]: Secondary | ICD-10-CM | POA: Diagnosis not present

## 2020-06-28 DIAGNOSIS — C50411 Malignant neoplasm of upper-outer quadrant of right female breast: Secondary | ICD-10-CM | POA: Insufficient documentation

## 2020-06-28 DIAGNOSIS — Z5111 Encounter for antineoplastic chemotherapy: Secondary | ICD-10-CM | POA: Insufficient documentation

## 2020-06-28 DIAGNOSIS — C50811 Malignant neoplasm of overlapping sites of right female breast: Secondary | ICD-10-CM

## 2020-06-28 DIAGNOSIS — C50911 Malignant neoplasm of unspecified site of right female breast: Secondary | ICD-10-CM

## 2020-06-28 LAB — CBC WITH DIFFERENTIAL/PLATELET
Abs Immature Granulocytes: 0.02 10*3/uL (ref 0.00–0.07)
Basophils Absolute: 0.1 10*3/uL (ref 0.0–0.1)
Basophils Relative: 1 %
Eosinophils Absolute: 0 10*3/uL (ref 0.0–0.5)
Eosinophils Relative: 0 %
HCT: 43.4 % (ref 36.0–46.0)
Hemoglobin: 14.1 g/dL (ref 12.0–15.0)
Immature Granulocytes: 0 %
Lymphocytes Relative: 30 %
Lymphs Abs: 2.1 10*3/uL (ref 0.7–4.0)
MCH: 29.4 pg (ref 26.0–34.0)
MCHC: 32.5 g/dL (ref 30.0–36.0)
MCV: 90.6 fL (ref 80.0–100.0)
Monocytes Absolute: 0.4 10*3/uL (ref 0.1–1.0)
Monocytes Relative: 5 %
Neutro Abs: 4.5 10*3/uL (ref 1.7–7.7)
Neutrophils Relative %: 64 %
Platelets: 239 10*3/uL (ref 150–400)
RBC: 4.79 MIL/uL (ref 3.87–5.11)
RDW: 13 % (ref 11.5–15.5)
WBC: 7 10*3/uL (ref 4.0–10.5)
nRBC: 0 % (ref 0.0–0.2)

## 2020-06-28 LAB — COMPREHENSIVE METABOLIC PANEL
ALT: 26 U/L (ref 0–44)
AST: 22 U/L (ref 15–41)
Albumin: 4.7 g/dL (ref 3.5–5.0)
Alkaline Phosphatase: 77 U/L (ref 38–126)
Anion gap: 7 (ref 5–15)
BUN: 15 mg/dL (ref 8–23)
CO2: 31 mmol/L (ref 22–32)
Calcium: 10.1 mg/dL (ref 8.9–10.3)
Chloride: 102 mmol/L (ref 98–111)
Creatinine, Ser: 0.69 mg/dL (ref 0.44–1.00)
GFR calc Af Amer: >60 mL/min (ref >60)
GFR calc non Af Amer: >60 mL/min (ref >60)
Glucose, Bld: 76 mg/dL (ref 70–99)
Potassium: 4.2 mmol/L (ref 3.5–5.1)
Sodium: 140 mmol/L (ref 135–145)
Total Bilirubin: 0.4 mg/dL (ref 0.3–1.2)
Total Protein: 7.4 g/dL (ref 6.5–8.1)

## 2020-06-28 MED ORDER — FULVESTRANT 250 MG/5ML IM SOLN
INTRAMUSCULAR | Status: AC
  Filled 2020-06-28: qty 10

## 2020-06-28 MED ORDER — FULVESTRANT 250 MG/5ML IM SOLN
500.0000 mg | Freq: Once | INTRAMUSCULAR | Status: AC
  Administered 2020-06-28: 500 mg via INTRAMUSCULAR

## 2020-06-28 NOTE — Patient Instructions (Signed)
Fulvestrant injection What is this medicine? FULVESTRANT (ful VES trant) blocks the effects of estrogen. It is used to treat breast cancer. This medicine may be used for other purposes; ask your health care provider or pharmacist if you have questions. COMMON BRAND NAME(S): FASLODEX What should I tell my health care provider before I take this medicine? They need to know if you have any of these conditions:  bleeding disorders  liver disease  low blood counts, like low white cell, platelet, or red cell counts  an unusual or allergic reaction to fulvestrant, other medicines, foods, dyes, or preservatives  pregnant or trying to get pregnant  breast-feeding How should I use this medicine? This medicine is for injection into a muscle. It is usually given by a health care professional in a hospital or clinic setting. Talk to your pediatrician regarding the use of this medicine in children. Special care may be needed. Overdosage: If you think you have taken too much of this medicine contact a poison control center or emergency room at once. NOTE: This medicine is only for you. Do not share this medicine with others. What if I miss a dose? It is important not to miss your dose. Call your doctor or health care professional if you are unable to keep an appointment. What may interact with this medicine?  medicines that treat or prevent blood clots like warfarin, enoxaparin, dalteparin, apixaban, dabigatran, and rivaroxaban This list may not describe all possible interactions. Give your health care provider a list of all the medicines, herbs, non-prescription drugs, or dietary supplements you use. Also tell them if you smoke, drink alcohol, or use illegal drugs. Some items may interact with your medicine. What should I watch for while using this medicine? Your condition will be monitored carefully while you are receiving this medicine. You will need important blood work done while you are taking  this medicine. Do not become pregnant while taking this medicine or for at least 1 year after stopping it. Women of child-bearing potential will need to have a negative pregnancy test before starting this medicine. Women should inform their doctor if they wish to become pregnant or think they might be pregnant. There is a potential for serious side effects to an unborn child. Men should inform their doctors if they wish to father a child. This medicine may lower sperm counts. Talk to your health care professional or pharmacist for more information. Do not breast-feed an infant while taking this medicine or for 1 year after the last dose. What side effects may I notice from receiving this medicine? Side effects that you should report to your doctor or health care professional as soon as possible:  allergic reactions like skin rash, itching or hives, swelling of the face, lips, or tongue  feeling faint or lightheaded, falls  pain, tingling, numbness, or weakness in the legs  signs and symptoms of infection like fever or chills; cough; flu-like symptoms; sore throat  vaginal bleeding Side effects that usually do not require medical attention (report to your doctor or health care professional if they continue or are bothersome):  aches, pains  constipation  diarrhea  headache  hot flashes  nausea, vomiting  pain at site where injected  stomach pain This list may not describe all possible side effects. Call your doctor for medical advice about side effects. You may report side effects to FDA at 1-800-FDA-1088. Where should I keep my medicine? This drug is given in a hospital or clinic and will   not be stored at home. NOTE: This sheet is a summary. It may not cover all possible information. If you have questions about this medicine, talk to your doctor, pharmacist, or health care provider.  2020 [employer]/Gold Standard (2018-01-08 11:34:41)  

## 2020-07-26 ENCOUNTER — Inpatient Hospital Stay: Payer: Medicare Other

## 2020-07-26 ENCOUNTER — Other Ambulatory Visit: Payer: Self-pay

## 2020-07-26 ENCOUNTER — Inpatient Hospital Stay: Payer: Medicare Other | Attending: Oncology

## 2020-07-26 VITALS — BP 133/84 | HR 62 | Temp 97.7°F | Resp 17

## 2020-07-26 DIAGNOSIS — C50811 Malignant neoplasm of overlapping sites of right female breast: Secondary | ICD-10-CM

## 2020-07-26 DIAGNOSIS — Z5111 Encounter for antineoplastic chemotherapy: Secondary | ICD-10-CM | POA: Diagnosis present

## 2020-07-26 DIAGNOSIS — C50411 Malignant neoplasm of upper-outer quadrant of right female breast: Secondary | ICD-10-CM | POA: Insufficient documentation

## 2020-07-26 DIAGNOSIS — Z170 Estrogen receptor positive status [ER+]: Secondary | ICD-10-CM

## 2020-07-26 DIAGNOSIS — C50911 Malignant neoplasm of unspecified site of right female breast: Secondary | ICD-10-CM

## 2020-07-26 LAB — COMPREHENSIVE METABOLIC PANEL
ALT: 25 U/L (ref 0–44)
AST: 21 U/L (ref 15–41)
Albumin: 4.6 g/dL (ref 3.5–5.0)
Alkaline Phosphatase: 75 U/L (ref 38–126)
Anion gap: 7 (ref 5–15)
BUN: 14 mg/dL (ref 8–23)
CO2: 29 mmol/L (ref 22–32)
Calcium: 9.9 mg/dL (ref 8.9–10.3)
Chloride: 104 mmol/L (ref 98–111)
Creatinine, Ser: 0.69 mg/dL (ref 0.44–1.00)
GFR, Estimated: >60 mL/min (ref >60)
Glucose, Bld: 77 mg/dL (ref 70–99)
Potassium: 4.4 mmol/L (ref 3.5–5.1)
Sodium: 140 mmol/L (ref 135–145)
Total Bilirubin: 0.4 mg/dL (ref 0.3–1.2)
Total Protein: 7 g/dL (ref 6.5–8.1)

## 2020-07-26 LAB — CBC WITH DIFFERENTIAL/PLATELET
Abs Immature Granulocytes: 0.01 10*3/uL (ref 0.00–0.07)
Basophils Absolute: 0 10*3/uL (ref 0.0–0.1)
Basophils Relative: 1 %
Eosinophils Absolute: 0 10*3/uL (ref 0.0–0.5)
Eosinophils Relative: 0 %
HCT: 42.8 % (ref 36.0–46.0)
Hemoglobin: 13.6 g/dL (ref 12.0–15.0)
Immature Granulocytes: 0 %
Lymphocytes Relative: 30 %
Lymphs Abs: 2 10*3/uL (ref 0.7–4.0)
MCH: 29.2 pg (ref 26.0–34.0)
MCHC: 31.8 g/dL (ref 30.0–36.0)
MCV: 91.8 fL (ref 80.0–100.0)
Monocytes Absolute: 0.3 10*3/uL (ref 0.1–1.0)
Monocytes Relative: 4 %
Neutro Abs: 4.3 10*3/uL (ref 1.7–7.7)
Neutrophils Relative %: 65 %
Platelets: 210 10*3/uL (ref 150–400)
RBC: 4.66 MIL/uL (ref 3.87–5.11)
RDW: 13.2 % (ref 11.5–15.5)
WBC: 6.6 10*3/uL (ref 4.0–10.5)
nRBC: 0 % (ref 0.0–0.2)

## 2020-07-26 MED ORDER — FULVESTRANT 250 MG/5ML IM SOLN
500.0000 mg | Freq: Once | INTRAMUSCULAR | Status: AC
  Administered 2020-07-26: 500 mg via INTRAMUSCULAR
  Filled 2020-07-26: qty 10

## 2020-07-26 NOTE — Patient Instructions (Signed)
Fulvestrant injection What is this medicine? FULVESTRANT (ful VES trant) blocks the effects of estrogen. It is used to treat breast cancer. This medicine may be used for other purposes; ask your health care provider or pharmacist if you have questions. COMMON BRAND NAME(S): FASLODEX What should I tell my health care provider before I take this medicine? They need to know if you have any of these conditions:  bleeding disorders  liver disease  low blood counts, like low white cell, platelet, or red cell counts  an unusual or allergic reaction to fulvestrant, other medicines, foods, dyes, or preservatives  pregnant or trying to get pregnant  breast-feeding How should I use this medicine? This medicine is for injection into a muscle. It is usually given by a health care professional in a hospital or clinic setting. Talk to your pediatrician regarding the use of this medicine in children. Special care may be needed. Overdosage: If you think you have taken too much of this medicine contact a poison control center or emergency room at once. NOTE: This medicine is only for you. Do not share this medicine with others. What if I miss a dose? It is important not to miss your dose. Call your doctor or health care professional if you are unable to keep an appointment. What may interact with this medicine?  medicines that treat or prevent blood clots like warfarin, enoxaparin, dalteparin, apixaban, dabigatran, and rivaroxaban This list may not describe all possible interactions. Give your health care provider a list of all the medicines, herbs, non-prescription drugs, or dietary supplements you use. Also tell them if you smoke, drink alcohol, or use illegal drugs. Some items may interact with your medicine. What should I watch for while using this medicine? Your condition will be monitored carefully while you are receiving this medicine. You will need important blood work done while you are taking  this medicine. Do not become pregnant while taking this medicine or for at least 1 year after stopping it. Women of child-bearing potential will need to have a negative pregnancy test before starting this medicine. Women should inform their doctor if they wish to become pregnant or think they might be pregnant. There is a potential for serious side effects to an unborn child. Men should inform their doctors if they wish to father a child. This medicine may lower sperm counts. Talk to your health care professional or pharmacist for more information. Do not breast-feed an infant while taking this medicine or for 1 year after the last dose. What side effects may I notice from receiving this medicine? Side effects that you should report to your doctor or health care professional as soon as possible:  allergic reactions like skin rash, itching or hives, swelling of the face, lips, or tongue  feeling faint or lightheaded, falls  pain, tingling, numbness, or weakness in the legs  signs and symptoms of infection like fever or chills; cough; flu-like symptoms; sore throat  vaginal bleeding Side effects that usually do not require medical attention (report to your doctor or health care professional if they continue or are bothersome):  aches, pains  constipation  diarrhea  headache  hot flashes  nausea, vomiting  pain at site where injected  stomach pain This list may not describe all possible side effects. Call your doctor for medical advice about side effects. You may report side effects to FDA at 1-800-FDA-1088. Where should I keep my medicine? This drug is given in a hospital or clinic and will   not be stored at home. NOTE: This sheet is a summary. It may not cover all possible information. If you have questions about this medicine, talk to your doctor, pharmacist, or health care provider.  2020 [employer]/Gold Standard (2018-01-08 11:34:41)  

## 2020-08-23 ENCOUNTER — Ambulatory Visit: Payer: BLUE CROSS/BLUE SHIELD

## 2020-08-23 ENCOUNTER — Ambulatory Visit (HOSPITAL_COMMUNITY)
Admission: RE | Admit: 2020-08-23 | Discharge: 2020-08-23 | Disposition: A | Discharge status: Home / Self Care | Payer: Medicare Other | Source: Ambulatory Visit | Attending: Oncology | Admitting: Oncology

## 2020-08-23 ENCOUNTER — Other Ambulatory Visit: Payer: BLUE CROSS/BLUE SHIELD

## 2020-08-23 ENCOUNTER — Other Ambulatory Visit: Payer: Self-pay

## 2020-08-23 DIAGNOSIS — C50811 Malignant neoplasm of overlapping sites of right female breast: Secondary | ICD-10-CM | POA: Diagnosis present

## 2020-08-23 DIAGNOSIS — C3410 Malignant neoplasm of upper lobe, unspecified bronchus or lung: Secondary | ICD-10-CM

## 2020-08-23 DIAGNOSIS — C50911 Malignant neoplasm of unspecified site of right female breast: Secondary | ICD-10-CM | POA: Diagnosis present

## 2020-08-23 DIAGNOSIS — Z170 Estrogen receptor positive status [ER+]: Secondary | ICD-10-CM | POA: Diagnosis present

## 2020-08-23 MED ORDER — IOHEXOL 300 MG/ML  SOLN
75.0000 mL | Freq: Once | INTRAMUSCULAR | Status: AC | PRN
  Administered 2020-08-23: 75 mL via INTRAVENOUS

## 2020-08-24 ENCOUNTER — Inpatient Hospital Stay (HOSPITAL_BASED_OUTPATIENT_CLINIC_OR_DEPARTMENT_OTHER): Payer: Medicare Other | Admitting: Oncology

## 2020-08-24 ENCOUNTER — Inpatient Hospital Stay: Payer: Medicare Other

## 2020-08-24 ENCOUNTER — Other Ambulatory Visit: Payer: Self-pay

## 2020-08-24 ENCOUNTER — Inpatient Hospital Stay: Payer: Medicare Other | Attending: Oncology

## 2020-08-24 VITALS — BP 124/72 | HR 79 | Temp 98.1°F | Resp 18 | Ht 66.0 in | Wt 133.4 lb

## 2020-08-24 DIAGNOSIS — Z85118 Personal history of other malignant neoplasm of bronchus and lung: Secondary | ICD-10-CM | POA: Insufficient documentation

## 2020-08-24 DIAGNOSIS — C50911 Malignant neoplasm of unspecified site of right female breast: Secondary | ICD-10-CM

## 2020-08-24 DIAGNOSIS — C50811 Malignant neoplasm of overlapping sites of right female breast: Secondary | ICD-10-CM

## 2020-08-24 DIAGNOSIS — Z08 Encounter for follow-up examination after completed treatment for malignant neoplasm: Secondary | ICD-10-CM | POA: Insufficient documentation

## 2020-08-24 DIAGNOSIS — Z170 Estrogen receptor positive status [ER+]: Secondary | ICD-10-CM

## 2020-08-24 DIAGNOSIS — Z853 Personal history of malignant neoplasm of breast: Secondary | ICD-10-CM | POA: Insufficient documentation

## 2020-08-24 DIAGNOSIS — C3410 Malignant neoplasm of upper lobe, unspecified bronchus or lung: Secondary | ICD-10-CM | POA: Diagnosis not present

## 2020-08-24 DIAGNOSIS — Z923 Personal history of irradiation: Secondary | ICD-10-CM | POA: Diagnosis not present

## 2020-08-24 DIAGNOSIS — Z9011 Acquired absence of right breast and nipple: Secondary | ICD-10-CM | POA: Diagnosis not present

## 2020-08-24 LAB — CBC WITH DIFFERENTIAL/PLATELET
Abs Immature Granulocytes: 0.01 10*3/uL (ref 0.00–0.07)
Basophils Absolute: 0 10*3/uL (ref 0.0–0.1)
Basophils Relative: 0 %
Eosinophils Absolute: 0.2 10*3/uL (ref 0.0–0.5)
Eosinophils Relative: 3 %
HCT: 39.7 % (ref 36.0–46.0)
Hemoglobin: 12.9 g/dL (ref 12.0–15.0)
Immature Granulocytes: 0 %
Lymphocytes Relative: 23 %
Lymphs Abs: 1.6 10*3/uL (ref 0.7–4.0)
MCH: 29.5 pg (ref 26.0–34.0)
MCHC: 32.5 g/dL (ref 30.0–36.0)
MCV: 90.6 fL (ref 80.0–100.0)
Monocytes Absolute: 0.3 10*3/uL (ref 0.1–1.0)
Monocytes Relative: 5 %
Neutro Abs: 5.1 10*3/uL (ref 1.7–7.7)
Neutrophils Relative %: 69 %
Platelets: 228 10*3/uL (ref 150–400)
RBC: 4.38 MIL/uL (ref 3.87–5.11)
RDW: 13 % (ref 11.5–15.5)
WBC: 7.3 10*3/uL (ref 4.0–10.5)
nRBC: 0 % (ref 0.0–0.2)

## 2020-08-24 LAB — COMPREHENSIVE METABOLIC PANEL
ALT: 27 U/L (ref 0–44)
AST: 27 U/L (ref 15–41)
Albumin: 4 g/dL (ref 3.5–5.0)
Alkaline Phosphatase: 88 U/L (ref 38–126)
Anion gap: 7 (ref 5–15)
BUN: 15 mg/dL (ref 8–23)
CO2: 27 mmol/L (ref 22–32)
Calcium: 9.2 mg/dL (ref 8.9–10.3)
Chloride: 103 mmol/L (ref 98–111)
Creatinine, Ser: 0.82 mg/dL (ref 0.44–1.00)
GFR, Estimated: >60 mL/min (ref >60)
Glucose, Bld: 125 mg/dL — ABNORMAL HIGH (ref 70–99)
Potassium: 4.3 mmol/L (ref 3.5–5.1)
Sodium: 137 mmol/L (ref 135–145)
Total Bilirubin: 0.4 mg/dL (ref 0.3–1.2)
Total Protein: 7.2 g/dL (ref 6.5–8.1)

## 2020-08-24 MED ORDER — FULVESTRANT 250 MG/5ML IM SOLN: 500.0000 mg | Freq: Once | INTRAMUSCULAR | Status: DC

## 2020-08-24 MED ORDER — FULVESTRANT 250 MG/5ML IM SOLN
INTRAMUSCULAR | Status: AC
  Filled 2020-08-24: qty 10

## 2020-08-24 NOTE — Progress Notes (Signed)
Pt left building and did not receive Faslodex injection. Pt stated Dr.Magrinat canceled injection. Confirmed from Dr.Magrinat pt was not receiving injection. Advised pt if there were any concerns to call office. Pt verbalized understanding.

## 2020-08-24 NOTE — Progress Notes (Signed)
[place]  [phone] [phone]     ID: Crystal [name]   DOB: [birth year]  MR#: [id number]  ZHY#:[id number]  Patient Care Team: Seward Carol, MD as PCP - General (Internal Medicine) Suhailah Kwan, Virgie Dad, MD as Consulting Physician (Oncology) Aloha Gell, MD as Consulting Physician (Obstetrics and Gynecology) [name], MD as Consulting Physician (General Surgery) Garlan Fair, MD as Consulting Physician (Gastroenterology) Belva Crome, MD as Consulting Physician (Cardiology) OTHER MD: Arnoldo Hooker [place]]   CHIEF COMPLAINT: recurrent estrogen receptor positive breast cancer (s/p right mastectomy); pT1a lung cancer  CURRENT TREATMENT: Completing 5 years of fulvestrant, received 4 doses of zoledronate   INTERVAL HISTORY: Crystal [name] returns today for follow-up and treatment of her estrogen receptor positive breast cancer, and also for follow-up of her lung cancer.   She continues on monthly fulvestrant.  She tolerates this with no significant complications.  She also receives zoledronate every 6 months, with a dose due today.  Since her last visit, she underwent chest CT yesterday, 08/23/2020 showing: no evidence of recurrent or metastatic disease.  She had her most recent mammogram at [facility] 09/14/2019 showing breast density category B.  There was no evidence of malignancy.  Repeat mammography and bone density are scheduled for December in [place] where the patient now resides   REVIEW OF SYSTEMS: Crystal [name] fell yesterday, hit her left hip and also left elbow.  She today has no swelling or tenderness in any of those areas.  She tripped over her cat she says.  She tolerated the Pfizer vaccines without any significant complication.  She is doing a lot of yard work.  She is having some reflux and using apple cider vinegar for that.  A detailed review of systems today was otherwise stable   COVID 19 VACCINATION STATUS: fully vaccinated  [employer]), with booster November 2021   BREAST CANCER HISTORY:  From the original intake note:  The patient had bilateral diagnostic mammography 02/12/2011.  She has a history of cystic changes noted on prior studies in April 2011 and April 2010.  On the Feb 12, 2011 study there were new calcifications identified superiorly in the right breast, without any obvious mass.  The patient was brought back for further studies, 02/19/2011 and Dr. Marcelo Baldy was able to localize the calcifications under stereotactic guidance.  The biopsy ([id number]) showed high-grade ductal carcinoma with papillary features which was 70% estrogen receptor positive, 0% progesterone receptor positive.  There was no definitive invasion and therefore further studies were not performed.   Bilateral breast MRIs were obtained Feb 26, 2011.  This showed marked nodular enhancement throughout both breasts, and in the upper central portion there was a post biopsy area of change measuring up to 3.7 cm.  Most of this is a fluid cavity with a thin rim of enhancement.  There were no suspicious areas in the left breast.  No internal mammary or axillary lymph nodes.  Of course, the patient has a history of hepatic adenomas and these were noted incidentally on the MRI.   Her definitive surgical treatment and subsequent history are as detailed below.   PAST MEDICAL HISTORY: Past Medical History:  Diagnosis Date  . Anxiety   . Cancer ([place])    right breast  . Complication of anesthesia    hard to wake up  . GERD (gastroesophageal reflux disease)   . History of small bowel obstruction   . Insomnia   . Liver masses   history of small-bowel obstruction with  partial colectomy March 2001.  The patient has a history of hepatic adenomas.  She has had liver resections x2.  The data for this is at [facility] and we will try to obtain it.  She has also had incisional hernia repair.  She has a history of osteopenia, and she has a history of multiple  anti-red cell antibodies making a transfusion difficult.  The patient tells me that she has a history of low calcium and a tendency to get liver injections.   PAST SURGICAL HISTORY: Past Surgical History:  Procedure Laterality Date  . BREAST SURGERY Right   . HERNIA REPAIR     incisional hernia repair x3  . laparotomies    . LIVER SURGERY    . LOBECTOMY Right 11/29/2015   Procedure: Completion Right Upper  LOBECTOMY, Lymph node dissection, on Q placement;  Surgeon: Grace Isaac, MD;  Location: [place];  Service: Thoracic;  Laterality: Right;  . MASTECTOMY W/ SENTINEL NODE BIOPSY Right 07/05/2015   Procedure: RIGHT MASTECTOMY WITH SENTINEL NODE BIOPSY AND PORT A CATH INSERTION;  Surgeon: [name], MD;  Location: [place];  Service: General;  Laterality: Right;  . PORTACATH PLACEMENT Left 07/05/2015   Procedure: INSERTION PORT-A-CATH;  Surgeon: [name], MD;  Location: [place];  Service: General;  Laterality: Left;  Marland Kitchen VIDEO ASSISTED THORACOSCOPY (VATS)/WEDGE RESECTION Right 11/29/2015   Procedure: BRONCHOSCOPY , VIDEO ASSISTED THORACOSCOPY (VATS)/WEDGE RESECTION;  Surgeon: Grace Isaac, MD;  Location[facility];  Service: Thoracic;  Laterality: Right;    FAMILY HISTORY: Family History  Problem Relation Age of Onset  . Ovarian cancer Mother 63       "metastasized to breasts"  . Breast cancer Maternal Grandmother 48       2-3 recurrences  . Stomach cancer Maternal Grandfather        dx. 47-49; metastatic stomach cancer  . Colon polyps Father        about 2-3 polyps removed at each colonoscopy  . Colon polyps Brother        "a few"  . Other Maternal Aunt        +TAH  . Colon cancer Paternal Aunt 90  . Brain cancer Daughter 17       astrocytoma  . Breast cancer Cousin        dx. 69s   The patient's mother died from ovarian cancer at the age of 34.  The patient's father is alive at age 73.  She has 2 brothers, no sisters.  A  maternal grandmother was diagnosed with breast cancer at the age of 84, and the maternal grandfather had stomach cancer at the age of 70.   GYNECOLOGIC HISTORY: The patient had menarche at age 47.  Her last menstrual period was in 2005.  She never used hormone replacement.  She used birth control briefly to control bleeding prior to 1980.  She was 64 years old at the birth of her first child.  She started having mammograms at age 45 because of a strong family history.   SOCIAL HISTORY: Crystal [name] worked as a Programmer, applications for [employer].   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  Son, Crystal [name], lives in [place] and works for that [employer]  Daughter, Crystal [name], lives in [place] and is a homemaker. The patient has moved to [place] but continued to receive  her oncology care here until "graduation" November 2021 2021   ADVANCED DIRECTIVES: in place   HEALTH MAINTENANCE: Social History   Tobacco Use  . Smoking status: Never Smoker  . Smokeless tobacco: Never Used  Substance Use Topics  . Alcohol use: Yes    Alcohol/week: 1.0 standard drink    Types: 1 Glasses of wine per week    Comment: social  . Drug use: No    Colonoscopy: 2014  PAP:  Bone density:   Lipid panel:  Allergies  Allergen Reactions  . Promethazine Hcl Other (See Comments)    Causes shaking  . Tylenol [Acetaminophen] Other (See Comments)    Avoids Tylenol products due to liver disease   . Penicillins Rash    Current Outpatient Medications  Medication Sig Dispense Refill  . b complex vitamins tablet Take 1 tablet by mouth daily.      . calcium-vitamin D (OSCAL WITH D) 500-200 MG-UNIT per tablet Take 2 tablets by mouth daily.    . famotidine (PEPCID AC) 10 MG chewable tablet Chew 10 mg by mouth daily as needed for heartburn.    . fulvestrant (FASLODEX) 250 MG/5ML injection Inject 500  mg into the muscle every 30 (thirty) days. One injection each buttock over 1-2 minutes. Warm prior to use.    Marland Kitchen glucosamine-chondroitin 500-400 MG tablet Take 1 tablet by mouth 3 (three) times daily.      Marland Kitchen ibuprofen (ADVIL,MOTRIN) 200 MG tablet Take 200 mg by mouth every 6 (six) hours as needed for fever or mild pain.     . Multiple Vitamins-Minerals (MULTIVITAMIN WITH MINERALS) tablet Take 1 tablet by mouth daily.      . Omega-3 Fatty Acids (FISH OIL) 500 MG CAPS Take 1,000 mg by mouth daily.    . Turmeric 500 MG TABS Take 1,000 mg by mouth daily.     No current facility-administered medications for this visit.    OBJECTIVE: white [name] who appears younger than stated age  64:   08/24/20 1340  BP: 124/72  Pulse: 79  Resp: 18  Temp: 98.1 F (36.7 C)  SpO2: 100%     Body mass index is 21.53 kg/m.    ECOG FS: 1  Sclerae unicteric, EOMs intact Wearing a mask No cervical or supraclavicular adenopathy Lungs no rales or rhonchi Heart regular rate and rhythm Abd soft, nontender, positive bowel sounds MSK no focal spinal tenderness, no upper extremity lymphedema Neuro: nonfocal, well oriented, appropriate affect Breasts: Status post right mastectomy.  There is no evidence of chest wall recurrence per the left breast is benign.  Both axillae are benign.   LAB RESULTS: Lab Results  Component Value Date   WBC 7.3 08/24/2020   NEUTROABS 5.1 08/24/2020   HGB 12.9 08/24/2020   HCT 39.7 08/24/2020   MCV 90.6 08/24/2020   PLT 228 08/24/2020      Chemistry      Component Value Date/Time   NA 140 07/26/2020 1000   NA 140 09/29/2017 0830   K 4.4 07/26/2020 1000   K 4.6 09/29/2017 0830   CL 104 07/26/2020 1000   CL 102 03/15/2013 0912   CO2 29 07/26/2020 1000   CO2 25 09/29/2017 0830   BUN 14 07/26/2020 1000   BUN 17.4 09/29/2017 0830   CREATININE 0.69 07/26/2020 1000   CREATININE 0.81 12/15/2019 1049   CREATININE 0.8 09/29/2017 0830      Component Value Date/Time    CALCIUM 9.9 07/26/2020 1000   CALCIUM 9.8  09/29/2017 0830   ALKPHOS 75 07/26/2020 1000   ALKPHOS 123 09/29/2017 0830   AST 21 07/26/2020 1000   AST 20 12/15/2019 1049   AST 22 09/29/2017 0830   ALT 25 07/26/2020 1000   ALT 26 12/15/2019 1049   ALT 32 09/29/2017 0830   BILITOT 0.4 07/26/2020 1000   BILITOT 0.3 12/15/2019 1049   BILITOT 0.26 09/29/2017 0830       Lab Results  Component Value Date   LABCA2 28 10/20/2012    No components found for: [id number]  No results for input(s): INR in the last 168 hours.  Urinalysis    Component Value Date/Time   COLORURINE YELLOW 11/27/2015 1403    STUDIES: CT Chest W Contrast  Result Date: 08/23/2020 CLINICAL DATA:  Follow-up non-small cell lung cancer, breast cancer, status post right upper lobectomy and right mastectomy EXAM: CT CHEST WITH CONTRAST TECHNIQUE: Multidetector CT imaging of the chest was performed during intravenous contrast administration. CONTRAST:  30m OMNIPAQUE IOHEXOL 300 MG/ML  SOLN COMPARISON:  08/10/2019 FINDINGS: Cardiovascular: No significant vascular findings. Normal heart size. No pericardial effusion. Mediastinum/Nodes: No enlarged mediastinal, hilar, or axillary lymph nodes. Thyroid gland, trachea, and esophagus demonstrate no significant findings. Lungs/Pleura: Status post right upper lobectomy. Occasional stable, definitively benign small pulmonary nodules, for example a small calcified nodule of the left apex measuring 4 mm (series 5, image 15). No pleural effusion or pneumothorax. Upper Abdomen: No acute abnormality. Redemonstrated postoperative findings right hepatectomy. Unchanged, heterogeneous subcapsular mass of the anterior right lobe of the liver, measuring approximately 3.6 x 2.6 cm (series 2, image 140). Musculoskeletal: No chest wall mass or suspicious bone lesions identified. Status post right mastectomy. IMPRESSION: 1. Unchanged postoperative findings of a right upper lobectomy. No evidence of  recurrent or metastatic disease in the chest. 2. Occasional stable, definitively benign small pulmonary nodules. 3. Status post right hepatectomy. Unchanged, heterogeneous subcapsular mass of the anterior right lobe of the liver, which is generally stable over time on multiple prior examinations and is of uncertain nature, better characterized by dedicated imaging of the abdomen. Electronically Signed   By: AEddie CandleM.D.   On: 08/23/2020 16:32      ASSESSMENT: 64y.o.  BRCA 1-2 and p-53 negative Crystal [name] status post right lumpectomy and sentinel lymph node sampling June 2012 for a T1a N0, Stage IA  invasive ductal carcinoma, grade 3, estrogen receptor 98% positive, progesterone receptor 97% positive, with an MIB-1 of 5% and no HER-2 amplification   (a) note that the initial Right breast biopsy showed DCIS w papillary architecture, estrogen receptor 70% positive, progesterone receptor negative  (1) completed radiation September 2012.   (2) on letrozole starting July 2012, discontinued August 2016 with recurrence   RECURRENT DISEASE: (3) right breast upper outer quadrant biopsy 05/23/2015 was positive for a clinical T1c N0, stage IA invasive ductal carcinoma, grade 2, estrogen receptor 60% positive with moderate staining intensity, progesterone receptor negative, with no HER-2 amplification and an MIB-1 of 90%.  (4) status post right simple mastectomy 07/05/2015 for a pT1c pN0, stage IA invasive ductal carcinoma, grade 3, with negative margins. 4 lymph nodes removed with mastectomy were benign.   (5) began cyclophosphamide and docetaxel 07/26/2015, repeated every 21 days 4, final dose 09/26/2015  (6) fulvestrant started 10/23/2015, last dose 08/24/2020  (7) genetics testing 07/20/2015 found no deleterious mutation in ATM, BARD1, BRCA1, BRCA2, BRIP1, CDH1, CHEK2, FANCC, MLH1, MSH2, MSH6, NBN, PALB2, PMS2, PTEN, RAD51C, RAD51D, TP53, and XRCC2.  Additionally no variants of uncertain  significance were found.   LUNG CANCER: (8) RUL spiculated nodule measuring 1.4 cm noted on staging CT scan 06/13/2015-- stable on repeat CT 07/25/15--moderately hot and slightly larger on PET 10/31/2015  (a) status post right upper lobectomy and lymph node dissection 11/29/2015 for a pT1a pN0, stage IA primary lung adenocarcinoma (estrogen, progesterone, and gross cystic disease fluid protein negative, strongly Napsin-A and TTF-1 positive), low-grade, with negative margins  (a) final chest CT (5 year yearly studies) 08/23/2020-- negative   (9) history of hepatic adenomas  (a) not a candidate for tamoxifen  (10) osteopenia:  (a) bone density 03/14/2016 showed a T score of -2.1.  (b) zoledronate Q 6 monthsstarted December 2019, 4th dose 03/08/2020   PLAN: Crystal [name] is now just over 5 years out from definitive surgery for her breast cancer recurrence.  There is no evidence of breast cancer recurrence and she is completing 5 years of Faslodex today.  She tolerated that well  She has been on zoledronate.  Receiving that every 6 months for 2 years further reduces the risk of breast cancer.  She will receive any further zoledronate treatments at the discretion of her primary care physician, Crystal [name], in [place].  We also reviewed the CT scan report.  This is very favorable.  She is now 5 years out from follow-up of the original lung changes and requires no further routine CTs of the chest.  In short I am comfortable releasing her to her primary care physician's care.  All she will need in terms of breast cancer follow-up is yearly physician breast and chest wall exam and yearly mammography.  However I think she would feel more comfortable if she did establish herself with a local oncologist.  That is her plan.  I will be glad to see Crystal [name] again at any point in the future if and when the need arises but as of now are making no further routine appointments for her here.  Total encounter time 25  minutes.*   Danesha Kirchoff, Virgie Dad, MD  08/24/20 2:06 PM Medical Oncology and Hematology [facility] [place], [place] [address] Tel. [phone]    Fax. [phone]   I, Crystal [name], am acting as scribe for Dr. Virgie Dad. Crystal [name].  I, Lurline Del MD, have reviewed the above documentation for accuracy and completeness, and I agree with the above.   *Total Encounter Time as defined by the Centers for Medicare and Medicaid Services includes, in addition to the face-to-face time of a patient visit (documented in the note above) non-face-to-face time: obtaining and reviewing outside history, ordering and reviewing medications, tests or procedures, care coordination (communications with other health care professionals or caregivers) and documentation in the medical record.

## 2020-08-29 ENCOUNTER — Telehealth: Payer: Self-pay | Admitting: Oncology

## 2020-08-29 NOTE — Telephone Encounter (Signed)
No 11/11 los. No changes made to pt's schedule.

## 2020-09-13 ENCOUNTER — Other Ambulatory Visit: Payer: Self-pay | Admitting: Oncology

## 2020-09-20 ENCOUNTER — Ambulatory Visit: Payer: BLUE CROSS/BLUE SHIELD

## 2020-09-20 ENCOUNTER — Other Ambulatory Visit: Payer: BLUE CROSS/BLUE SHIELD
